# Patient Record
Sex: Female | Born: 1945 | Race: Black or African American | Hispanic: No | Marital: Single | Smoking: Current every day smoker
Health system: Southern US, Community
[De-identification: ages and names within clinical notes are randomized; demographics above are authoritative.]

## PROBLEM LIST (undated history)

## (undated) DIAGNOSIS — J189 Pneumonia, unspecified organism: Secondary | ICD-10-CM

## (undated) DIAGNOSIS — K219 Gastro-esophageal reflux disease without esophagitis: Secondary | ICD-10-CM

## (undated) DIAGNOSIS — K760 Fatty (change of) liver, not elsewhere classified: Secondary | ICD-10-CM

## (undated) DIAGNOSIS — I509 Heart failure, unspecified: Secondary | ICD-10-CM

## (undated) DIAGNOSIS — K861 Other chronic pancreatitis: Secondary | ICD-10-CM

## (undated) DIAGNOSIS — K831 Obstruction of bile duct: Secondary | ICD-10-CM

## (undated) DIAGNOSIS — F329 Major depressive disorder, single episode, unspecified: Secondary | ICD-10-CM

## (undated) DIAGNOSIS — L409 Psoriasis, unspecified: Secondary | ICD-10-CM

## (undated) DIAGNOSIS — F32A Depression, unspecified: Secondary | ICD-10-CM

## (undated) DIAGNOSIS — F1010 Alcohol abuse, uncomplicated: Secondary | ICD-10-CM

## (undated) DIAGNOSIS — I2510 Atherosclerotic heart disease of native coronary artery without angina pectoris: Secondary | ICD-10-CM

## (undated) DIAGNOSIS — I739 Peripheral vascular disease, unspecified: Secondary | ICD-10-CM

## (undated) DIAGNOSIS — I2109 ST elevation (STEMI) myocardial infarction involving other coronary artery of anterior wall: Secondary | ICD-10-CM

## (undated) DIAGNOSIS — E785 Hyperlipidemia, unspecified: Secondary | ICD-10-CM

## (undated) DIAGNOSIS — Z8489 Family history of other specified conditions: Secondary | ICD-10-CM

## (undated) DIAGNOSIS — I10 Essential (primary) hypertension: Secondary | ICD-10-CM

## (undated) DIAGNOSIS — Z9289 Personal history of other medical treatment: Secondary | ICD-10-CM

## (undated) DIAGNOSIS — K449 Diaphragmatic hernia without obstruction or gangrene: Secondary | ICD-10-CM

## (undated) DIAGNOSIS — E119 Type 2 diabetes mellitus without complications: Secondary | ICD-10-CM

## (undated) DIAGNOSIS — J42 Unspecified chronic bronchitis: Secondary | ICD-10-CM

## (undated) DIAGNOSIS — D126 Benign neoplasm of colon, unspecified: Secondary | ICD-10-CM

## (undated) HISTORY — DX: Psoriasis, unspecified: L40.9

## (undated) HISTORY — DX: Hyperlipidemia, unspecified: E78.5

## (undated) HISTORY — PX: UTERINE FIBROID SURGERY: SHX826

## (undated) HISTORY — PX: CARDIAC CATHETERIZATION: SHX172

## (undated) HISTORY — DX: Obstruction of bile duct: K83.1

## (undated) HISTORY — PX: COLONOSCOPY: SHX174

## (undated) HISTORY — DX: Major depressive disorder, single episode, unspecified: F32.9

## (undated) HISTORY — DX: Fatty (change of) liver, not elsewhere classified: K76.0

## (undated) HISTORY — DX: Diaphragmatic hernia without obstruction or gangrene: K44.9

## (undated) HISTORY — DX: Depression, unspecified: F32.A

## (undated) HISTORY — PX: BACK SURGERY: SHX140

## (undated) HISTORY — DX: Alcohol abuse, uncomplicated: F10.10

## (undated) HISTORY — DX: Benign neoplasm of colon, unspecified: D12.6

---

## 1974-10-24 HISTORY — PX: CHOLECYSTECTOMY OPEN: SUR202

## 1992-10-24 HISTORY — PX: ABDOMINAL HYSTERECTOMY: SHX81

## 1999-10-30 ENCOUNTER — Emergency Department (HOSPITAL_COMMUNITY): Admission: EM | Admit: 1999-10-30 | Discharge: 1999-10-30 | Payer: Self-pay | Admitting: Emergency Medicine

## 2000-02-02 ENCOUNTER — Ambulatory Visit (HOSPITAL_COMMUNITY): Admission: RE | Admit: 2000-02-02 | Discharge: 2000-02-02 | Payer: Self-pay | Admitting: Internal Medicine

## 2001-02-01 ENCOUNTER — Encounter: Payer: Self-pay | Admitting: Internal Medicine

## 2001-02-01 ENCOUNTER — Ambulatory Visit (HOSPITAL_COMMUNITY): Admission: RE | Admit: 2001-02-01 | Discharge: 2001-02-01 | Payer: Self-pay | Admitting: Internal Medicine

## 2001-11-08 ENCOUNTER — Encounter: Payer: Self-pay | Admitting: Emergency Medicine

## 2001-11-08 ENCOUNTER — Emergency Department (HOSPITAL_COMMUNITY): Admission: EM | Admit: 2001-11-08 | Discharge: 2001-11-08 | Payer: Self-pay | Admitting: Emergency Medicine

## 2002-05-22 ENCOUNTER — Encounter: Payer: Self-pay | Admitting: Internal Medicine

## 2002-05-22 ENCOUNTER — Ambulatory Visit (HOSPITAL_COMMUNITY): Admission: RE | Admit: 2002-05-22 | Discharge: 2002-05-22 | Payer: Self-pay | Admitting: Internal Medicine

## 2002-08-12 ENCOUNTER — Encounter: Payer: Self-pay | Admitting: Emergency Medicine

## 2002-08-13 ENCOUNTER — Inpatient Hospital Stay (HOSPITAL_COMMUNITY): Admission: EM | Admit: 2002-08-13 | Discharge: 2002-08-15 | Payer: Self-pay | Admitting: Emergency Medicine

## 2002-08-13 ENCOUNTER — Encounter: Payer: Self-pay | Admitting: Internal Medicine

## 2002-08-13 ENCOUNTER — Encounter (INDEPENDENT_AMBULATORY_CARE_PROVIDER_SITE_OTHER): Payer: Self-pay | Admitting: Cardiology

## 2002-09-16 ENCOUNTER — Inpatient Hospital Stay (HOSPITAL_COMMUNITY): Admission: RE | Admit: 2002-09-16 | Discharge: 2002-09-23 | Payer: Self-pay | Admitting: Cardiology

## 2002-09-20 ENCOUNTER — Encounter: Payer: Self-pay | Admitting: Cardiology

## 2003-06-02 ENCOUNTER — Encounter: Payer: Self-pay | Admitting: Internal Medicine

## 2003-06-02 ENCOUNTER — Ambulatory Visit (HOSPITAL_COMMUNITY): Admission: RE | Admit: 2003-06-02 | Discharge: 2003-06-02 | Payer: Self-pay | Admitting: Obstetrics

## 2004-03-31 ENCOUNTER — Emergency Department (HOSPITAL_COMMUNITY): Admission: EM | Admit: 2004-03-31 | Discharge: 2004-03-31 | Payer: Self-pay

## 2005-05-04 ENCOUNTER — Emergency Department (HOSPITAL_COMMUNITY): Admission: EM | Admit: 2005-05-04 | Discharge: 2005-05-04 | Payer: Self-pay | Admitting: Emergency Medicine

## 2005-09-28 ENCOUNTER — Emergency Department (HOSPITAL_COMMUNITY): Admission: EM | Admit: 2005-09-28 | Discharge: 2005-09-28 | Payer: Self-pay | Admitting: Emergency Medicine

## 2005-11-24 ENCOUNTER — Inpatient Hospital Stay (HOSPITAL_COMMUNITY): Admission: EM | Admit: 2005-11-24 | Discharge: 2005-11-26 | Payer: Self-pay | Admitting: Emergency Medicine

## 2005-11-24 ENCOUNTER — Ambulatory Visit: Payer: Self-pay | Admitting: Family Medicine

## 2005-12-23 ENCOUNTER — Ambulatory Visit: Payer: Self-pay | Admitting: Family Medicine

## 2006-01-05 ENCOUNTER — Encounter: Admission: RE | Admit: 2006-01-05 | Discharge: 2006-04-05 | Payer: Self-pay | Admitting: Family Medicine

## 2006-01-25 ENCOUNTER — Ambulatory Visit: Payer: Self-pay | Admitting: Family Medicine

## 2006-07-25 ENCOUNTER — Ambulatory Visit: Payer: Self-pay | Admitting: Sports Medicine

## 2006-08-18 ENCOUNTER — Ambulatory Visit: Payer: Self-pay | Admitting: Family Medicine

## 2006-10-25 ENCOUNTER — Ambulatory Visit: Payer: Self-pay | Admitting: Family Medicine

## 2006-10-25 ENCOUNTER — Emergency Department (HOSPITAL_COMMUNITY): Admission: EM | Admit: 2006-10-25 | Discharge: 2006-10-25 | Payer: Self-pay | Admitting: Emergency Medicine

## 2006-10-25 ENCOUNTER — Inpatient Hospital Stay (HOSPITAL_COMMUNITY): Admission: EM | Admit: 2006-10-25 | Discharge: 2006-10-28 | Payer: Self-pay | Admitting: Emergency Medicine

## 2006-11-07 ENCOUNTER — Ambulatory Visit (HOSPITAL_COMMUNITY): Admission: RE | Admit: 2006-11-07 | Discharge: 2006-11-07 | Payer: Self-pay | Admitting: Family Medicine

## 2006-11-07 ENCOUNTER — Encounter (INDEPENDENT_AMBULATORY_CARE_PROVIDER_SITE_OTHER): Payer: Self-pay | Admitting: Family Medicine

## 2006-11-07 ENCOUNTER — Ambulatory Visit: Payer: Self-pay | Admitting: Family Medicine

## 2006-11-07 LAB — CONVERTED CEMR LAB
Total CK: 90 units/L (ref 7–177)
Troponin I: 0.01 ng/mL (ref <0.06)

## 2006-11-16 ENCOUNTER — Ambulatory Visit: Payer: Self-pay | Admitting: Family Medicine

## 2006-11-30 ENCOUNTER — Ambulatory Visit: Payer: Self-pay | Admitting: Family Medicine

## 2006-12-05 ENCOUNTER — Ambulatory Visit: Payer: Self-pay | Admitting: Family Medicine

## 2006-12-05 ENCOUNTER — Encounter: Payer: Self-pay | Admitting: Family Medicine

## 2006-12-05 LAB — CONVERTED CEMR LAB
Cholesterol: 98 mg/dL (ref 0–200)
Total CHOL/HDL Ratio: 2.3
Triglycerides: 74 mg/dL (ref <150)
VLDL: 15 mg/dL (ref 0–40)

## 2006-12-21 DIAGNOSIS — E119 Type 2 diabetes mellitus without complications: Secondary | ICD-10-CM

## 2006-12-21 DIAGNOSIS — F17200 Nicotine dependence, unspecified, uncomplicated: Secondary | ICD-10-CM

## 2006-12-21 DIAGNOSIS — G4700 Insomnia, unspecified: Secondary | ICD-10-CM

## 2006-12-21 DIAGNOSIS — I10 Essential (primary) hypertension: Secondary | ICD-10-CM

## 2006-12-26 ENCOUNTER — Telehealth: Payer: Self-pay | Admitting: *Deleted

## 2006-12-27 ENCOUNTER — Telehealth: Payer: Self-pay | Admitting: Family Medicine

## 2007-01-17 ENCOUNTER — Emergency Department (HOSPITAL_COMMUNITY): Admission: EM | Admit: 2007-01-17 | Discharge: 2007-01-17 | Payer: Self-pay | Admitting: Emergency Medicine

## 2007-01-19 ENCOUNTER — Telehealth: Payer: Self-pay | Admitting: *Deleted

## 2007-01-22 ENCOUNTER — Telehealth: Payer: Self-pay | Admitting: *Deleted

## 2007-01-25 ENCOUNTER — Telehealth: Payer: Self-pay | Admitting: Family Medicine

## 2007-01-25 ENCOUNTER — Encounter: Payer: Self-pay | Admitting: Family Medicine

## 2007-01-25 ENCOUNTER — Ambulatory Visit: Payer: Self-pay | Admitting: Sports Medicine

## 2007-01-25 DIAGNOSIS — E785 Hyperlipidemia, unspecified: Secondary | ICD-10-CM | POA: Insufficient documentation

## 2007-01-25 DIAGNOSIS — I2510 Atherosclerotic heart disease of native coronary artery without angina pectoris: Secondary | ICD-10-CM

## 2007-01-25 LAB — CONVERTED CEMR LAB
Calcium: 8.5 mg/dL (ref 8.4–10.5)
Glucose, Bld: 113 mg/dL — ABNORMAL HIGH (ref 70–99)
HCT: 43.3 %
Platelets: 411 10*3/uL
Sodium: 138 meq/L (ref 135–145)
WBC: 7 10*3/uL

## 2007-01-26 ENCOUNTER — Telehealth: Payer: Self-pay | Admitting: *Deleted

## 2007-02-01 ENCOUNTER — Encounter (HOSPITAL_BASED_OUTPATIENT_CLINIC_OR_DEPARTMENT_OTHER): Admission: RE | Admit: 2007-02-01 | Discharge: 2007-05-02 | Payer: Self-pay | Admitting: Surgery

## 2007-02-09 ENCOUNTER — Observation Stay (HOSPITAL_COMMUNITY): Admission: EM | Admit: 2007-02-09 | Discharge: 2007-02-12 | Payer: Self-pay | Admitting: Emergency Medicine

## 2007-02-09 ENCOUNTER — Ambulatory Visit: Payer: Self-pay | Admitting: Family Medicine

## 2007-02-09 ENCOUNTER — Encounter: Payer: Self-pay | Admitting: Family Medicine

## 2007-02-15 ENCOUNTER — Ambulatory Visit: Admission: RE | Admit: 2007-02-15 | Discharge: 2007-02-15 | Payer: Self-pay | Admitting: Surgery

## 2007-02-15 ENCOUNTER — Encounter: Payer: Self-pay | Admitting: Vascular Surgery

## 2007-02-21 ENCOUNTER — Telehealth: Payer: Self-pay | Admitting: Family Medicine

## 2007-02-22 ENCOUNTER — Telehealth: Payer: Self-pay | Admitting: *Deleted

## 2007-02-28 ENCOUNTER — Encounter: Payer: Self-pay | Admitting: Family Medicine

## 2007-03-01 ENCOUNTER — Telehealth: Payer: Self-pay | Admitting: Family Medicine

## 2007-03-04 ENCOUNTER — Inpatient Hospital Stay (HOSPITAL_COMMUNITY): Admission: EM | Admit: 2007-03-04 | Discharge: 2007-03-07 | Payer: Self-pay | Admitting: Emergency Medicine

## 2007-03-22 ENCOUNTER — Telehealth: Payer: Self-pay | Admitting: Family Medicine

## 2007-04-06 ENCOUNTER — Telehealth: Payer: Self-pay | Admitting: *Deleted

## 2007-04-18 ENCOUNTER — Telehealth (INDEPENDENT_AMBULATORY_CARE_PROVIDER_SITE_OTHER): Payer: Self-pay | Admitting: *Deleted

## 2007-05-18 ENCOUNTER — Encounter (INDEPENDENT_AMBULATORY_CARE_PROVIDER_SITE_OTHER): Payer: Self-pay | Admitting: Family Medicine

## 2007-05-18 ENCOUNTER — Ambulatory Visit: Payer: Self-pay | Admitting: Family Medicine

## 2007-05-18 LAB — CONVERTED CEMR LAB: Hgb A1c MFr Bld: 5.3 %

## 2007-05-19 LAB — CONVERTED CEMR LAB
Basophils Relative: 0 % (ref 0–1)
Eosinophils Absolute: 0.2 10*3/uL (ref 0.0–0.7)
Eosinophils Relative: 4 % (ref 0–5)
Hemoglobin: 13.9 g/dL (ref 12.0–15.0)
Lymphs Abs: 1.5 10*3/uL (ref 0.7–3.3)
Monocytes Relative: 8 % (ref 3–11)
Neutrophils Relative %: 57 % (ref 43–77)
RBC: 5.71 M/uL — ABNORMAL HIGH (ref 3.87–5.11)
RDW: 21.1 % — ABNORMAL HIGH (ref 11.5–14.0)
TSH: 2.463 microintl units/mL (ref 0.350–5.50)

## 2007-05-30 ENCOUNTER — Telehealth: Payer: Self-pay | Admitting: *Deleted

## 2007-05-31 ENCOUNTER — Telehealth (INDEPENDENT_AMBULATORY_CARE_PROVIDER_SITE_OTHER): Payer: Self-pay | Admitting: Family Medicine

## 2007-06-28 ENCOUNTER — Encounter (INDEPENDENT_AMBULATORY_CARE_PROVIDER_SITE_OTHER): Payer: Self-pay | Admitting: *Deleted

## 2007-08-22 ENCOUNTER — Telehealth (INDEPENDENT_AMBULATORY_CARE_PROVIDER_SITE_OTHER): Payer: Self-pay | Admitting: Family Medicine

## 2007-11-22 ENCOUNTER — Inpatient Hospital Stay (HOSPITAL_COMMUNITY): Admission: AD | Admit: 2007-11-22 | Discharge: 2007-11-26 | Payer: Self-pay | Admitting: Family Medicine

## 2007-11-22 ENCOUNTER — Ambulatory Visit: Payer: Self-pay | Admitting: Family Medicine

## 2007-11-22 ENCOUNTER — Encounter: Payer: Self-pay | Admitting: Emergency Medicine

## 2007-11-26 ENCOUNTER — Telehealth: Payer: Self-pay | Admitting: *Deleted

## 2008-01-16 ENCOUNTER — Encounter (INDEPENDENT_AMBULATORY_CARE_PROVIDER_SITE_OTHER): Payer: Self-pay | Admitting: Family Medicine

## 2008-01-16 ENCOUNTER — Encounter: Payer: Self-pay | Admitting: Sports Medicine

## 2008-01-16 ENCOUNTER — Inpatient Hospital Stay (HOSPITAL_COMMUNITY): Admission: EM | Admit: 2008-01-16 | Discharge: 2008-01-18 | Payer: Self-pay | Admitting: Emergency Medicine

## 2008-01-16 LAB — CONVERTED CEMR LAB
Cholesterol: 89 mg/dL
Cholesterol: 89 mg/dL
HDL: 39 mg/dL
HDL: 39 mg/dL
LDL Cholesterol: 38 mg/dL
Triglycerides: 59 mg/dL
Triglycerides: 59 mg/dL
VLDL: 12 mg/dL

## 2008-02-13 ENCOUNTER — Telehealth (INDEPENDENT_AMBULATORY_CARE_PROVIDER_SITE_OTHER): Payer: Self-pay | Admitting: Family Medicine

## 2008-03-21 ENCOUNTER — Ambulatory Visit: Payer: Self-pay | Admitting: Family Medicine

## 2008-03-21 ENCOUNTER — Encounter (INDEPENDENT_AMBULATORY_CARE_PROVIDER_SITE_OTHER): Payer: Self-pay | Admitting: Family Medicine

## 2008-03-21 DIAGNOSIS — F1021 Alcohol dependence, in remission: Secondary | ICD-10-CM

## 2008-03-21 LAB — CONVERTED CEMR LAB
BUN: 10 mg/dL (ref 6–23)
CO2: 29 meq/L (ref 19–32)
Calcium: 9 mg/dL (ref 8.4–10.5)
Chloride: 104 meq/L (ref 96–112)
Creatinine, Ser: 0.59 mg/dL (ref 0.40–1.20)

## 2008-03-23 ENCOUNTER — Encounter (INDEPENDENT_AMBULATORY_CARE_PROVIDER_SITE_OTHER): Payer: Self-pay | Admitting: Family Medicine

## 2008-03-25 ENCOUNTER — Telehealth (INDEPENDENT_AMBULATORY_CARE_PROVIDER_SITE_OTHER): Payer: Self-pay | Admitting: Family Medicine

## 2008-03-27 ENCOUNTER — Telehealth (INDEPENDENT_AMBULATORY_CARE_PROVIDER_SITE_OTHER): Payer: Self-pay | Admitting: *Deleted

## 2008-07-28 ENCOUNTER — Encounter: Payer: Self-pay | Admitting: *Deleted

## 2008-07-30 ENCOUNTER — Telehealth (INDEPENDENT_AMBULATORY_CARE_PROVIDER_SITE_OTHER): Payer: Self-pay | Admitting: *Deleted

## 2008-08-13 ENCOUNTER — Encounter (INDEPENDENT_AMBULATORY_CARE_PROVIDER_SITE_OTHER): Payer: Self-pay | Admitting: Family Medicine

## 2008-08-13 ENCOUNTER — Ambulatory Visit: Payer: Self-pay | Admitting: Family Medicine

## 2008-08-13 DIAGNOSIS — K861 Other chronic pancreatitis: Secondary | ICD-10-CM

## 2008-08-13 DIAGNOSIS — L408 Other psoriasis: Secondary | ICD-10-CM

## 2008-08-15 ENCOUNTER — Telehealth: Payer: Self-pay | Admitting: *Deleted

## 2008-08-18 LAB — CONVERTED CEMR LAB
ALT: 142 units/L — ABNORMAL HIGH (ref 0–35)
AST: 119 units/L — ABNORMAL HIGH (ref 0–37)
Alkaline Phosphatase: 306 units/L — ABNORMAL HIGH (ref 39–117)
Basophils Absolute: 0 10*3/uL (ref 0.0–0.1)
Basophils Relative: 0 % (ref 0–1)
Eosinophils Absolute: 0.2 10*3/uL (ref 0.0–0.7)
Eosinophils Relative: 3 % (ref 0–5)
HCT: 46 % (ref 36.0–46.0)
MCV: 70.7 fL — ABNORMAL LOW (ref 78.0–100.0)
Neutrophils Relative %: 58 % (ref 43–77)
Platelets: 573 10*3/uL — ABNORMAL HIGH (ref 150–400)
RDW: 19.2 % — ABNORMAL HIGH (ref 11.5–15.5)
Sodium: 139 meq/L (ref 135–145)
Total Bilirubin: 0.9 mg/dL (ref 0.3–1.2)
Total Protein: 7.7 g/dL (ref 6.0–8.3)

## 2008-09-10 ENCOUNTER — Ambulatory Visit: Payer: Self-pay | Admitting: Family Medicine

## 2008-09-25 ENCOUNTER — Encounter (INDEPENDENT_AMBULATORY_CARE_PROVIDER_SITE_OTHER): Payer: Self-pay | Admitting: Family Medicine

## 2008-09-30 ENCOUNTER — Ambulatory Visit: Payer: Self-pay | Admitting: Family Medicine

## 2008-10-08 ENCOUNTER — Ambulatory Visit (HOSPITAL_COMMUNITY): Admission: RE | Admit: 2008-10-08 | Discharge: 2008-10-08 | Payer: Self-pay | Admitting: Family Medicine

## 2008-10-10 ENCOUNTER — Encounter (INDEPENDENT_AMBULATORY_CARE_PROVIDER_SITE_OTHER): Payer: Self-pay | Admitting: Family Medicine

## 2009-02-19 ENCOUNTER — Encounter (INDEPENDENT_AMBULATORY_CARE_PROVIDER_SITE_OTHER): Payer: Self-pay | Admitting: Family Medicine

## 2009-04-14 ENCOUNTER — Telehealth (INDEPENDENT_AMBULATORY_CARE_PROVIDER_SITE_OTHER): Payer: Self-pay | Admitting: Family Medicine

## 2010-01-05 ENCOUNTER — Encounter: Payer: Self-pay | Admitting: Sports Medicine

## 2010-01-21 ENCOUNTER — Ambulatory Visit: Payer: Self-pay | Admitting: Family Medicine

## 2010-01-21 ENCOUNTER — Encounter: Payer: Self-pay | Admitting: Family Medicine

## 2010-01-21 ENCOUNTER — Inpatient Hospital Stay (HOSPITAL_COMMUNITY): Admission: EM | Admit: 2010-01-21 | Discharge: 2010-01-24 | Payer: Self-pay | Admitting: Emergency Medicine

## 2010-01-22 ENCOUNTER — Encounter: Payer: Self-pay | Admitting: Internal Medicine

## 2010-01-22 LAB — CONVERTED CEMR LAB
HDL: 13 mg/dL
LDL Cholesterol: 24 mg/dL

## 2010-01-23 LAB — CONVERTED CEMR LAB
Creatinine, Ser: 0.51 mg/dL
Potassium: 3.8 meq/L

## 2010-02-03 ENCOUNTER — Encounter: Payer: Self-pay | Admitting: Sports Medicine

## 2010-02-03 ENCOUNTER — Ambulatory Visit: Payer: Self-pay | Admitting: Family Medicine

## 2010-02-03 ENCOUNTER — Telehealth (INDEPENDENT_AMBULATORY_CARE_PROVIDER_SITE_OTHER): Payer: Self-pay | Admitting: Family Medicine

## 2010-02-03 LAB — CONVERTED CEMR LAB
Albumin/Creatinine Ratio, Urine, POC: ABNORMAL
BUN: 18 mg/dL (ref 6–23)
CO2: 17 meq/L — ABNORMAL LOW (ref 19–32)
Calcium: 8.4 mg/dL (ref 8.4–10.5)
Chloride: 111 meq/L (ref 96–112)
Cholesterol: 77 mg/dL (ref 0–200)
Creatinine, Ser: 0.47 mg/dL (ref 0.40–1.20)
Creatinine,U: 100 mg/dL
Glucose, Bld: 67 mg/dL — ABNORMAL LOW (ref 70–99)
HDL: 22 mg/dL — ABNORMAL LOW (ref >39)
Hgb A1c MFr Bld: 5.7 %
LDL Cholesterol: 43 mg/dL (ref 0–99)
Microalbumin U total vol: ABNORMAL mg/L
Potassium: 4 meq/L (ref 3.5–5.3)
Sodium: 140 meq/L (ref 135–145)
Total CHOL/HDL Ratio: 3.5
Triglycerides: 61 mg/dL (ref <150)
VLDL: 12 mg/dL (ref 0–40)

## 2010-02-04 ENCOUNTER — Encounter: Payer: Self-pay | Admitting: Sports Medicine

## 2010-02-11 ENCOUNTER — Telehealth: Payer: Self-pay | Admitting: Sports Medicine

## 2010-02-16 ENCOUNTER — Telehealth: Payer: Self-pay | Admitting: Sports Medicine

## 2010-02-22 ENCOUNTER — Telehealth: Payer: Self-pay | Admitting: Sports Medicine

## 2010-02-22 ENCOUNTER — Telehealth (INDEPENDENT_AMBULATORY_CARE_PROVIDER_SITE_OTHER): Payer: Self-pay | Admitting: *Deleted

## 2010-02-24 ENCOUNTER — Ambulatory Visit: Payer: Self-pay | Admitting: Family Medicine

## 2010-02-25 ENCOUNTER — Telehealth: Payer: Self-pay | Admitting: Sports Medicine

## 2010-03-19 ENCOUNTER — Encounter: Payer: Self-pay | Admitting: Sports Medicine

## 2010-03-19 ENCOUNTER — Ambulatory Visit: Payer: Self-pay | Admitting: Family Medicine

## 2010-04-02 ENCOUNTER — Encounter: Payer: Self-pay | Admitting: Sports Medicine

## 2010-04-20 HISTORY — PX: OTHER SURGICAL HISTORY: SHX169

## 2010-04-20 HISTORY — PX: TRANSTHORACIC ECHOCARDIOGRAM: SHX275

## 2010-04-21 ENCOUNTER — Telehealth: Payer: Self-pay | Admitting: *Deleted

## 2010-05-04 ENCOUNTER — Ambulatory Visit: Payer: Self-pay | Admitting: Internal Medicine

## 2010-05-04 ENCOUNTER — Encounter (INDEPENDENT_AMBULATORY_CARE_PROVIDER_SITE_OTHER): Payer: Self-pay | Admitting: *Deleted

## 2010-06-16 ENCOUNTER — Ambulatory Visit: Payer: Self-pay | Admitting: Internal Medicine

## 2010-06-17 ENCOUNTER — Encounter (INDEPENDENT_AMBULATORY_CARE_PROVIDER_SITE_OTHER): Payer: Self-pay | Admitting: *Deleted

## 2010-08-30 ENCOUNTER — Encounter: Payer: Self-pay | Admitting: Sports Medicine

## 2010-11-13 ENCOUNTER — Encounter: Payer: Self-pay | Admitting: Internal Medicine

## 2010-11-14 ENCOUNTER — Encounter: Payer: Self-pay | Admitting: Sports Medicine

## 2010-11-14 ENCOUNTER — Encounter: Payer: Self-pay | Admitting: Cardiovascular Disease

## 2010-11-23 NOTE — Progress Notes (Signed)
Summary: phn msg  Phone Note Call from Patient Call back at Home Phone [phone]   Caller: Patient Summary of Call: pls mail paperwork to her home when finished. Initial call taken by: De Nurse,  Feb 25, 2010 8:44 AM  Follow-up for Phone Call        Is this the paperwork for the nutritional supplements?  If so I have already filled out and placed in to-do box. Follow-up by: Rodney Langton MD,  Feb 25, 2010 1:42 PM  Additional Follow-up for Phone Call Additional follow up Details #1::        mailed Additional Follow-up by: De Nurse,  Feb 25, 2010 4:03 PM

## 2010-11-23 NOTE — Miscellaneous (Signed)
Summary: clerarance letter needed  Clinical Lists Changes the oral surgeon needs a letter to clear her for the dental extractions he will be doing. to pcp.Golden Circle RN  April 02, 2010 8:39 AM  Noted, she has had a recent MI and has a drug eluting stent in place so she will need a cardiac stress test of some kind, I have referred her to cardiology for this at the end of last month.  Awaiting their recs.  Now if her Oral surgeon can do this under local anesthesia then she wouldn't need clearance.  Let me know. Rodney Langton MD  April 02, 2010 10:28 AM   Appended Document: clerarance letter needed D/w Dr. Karsten Fells at [facility], pt will need  a myoview, scheduled for next week.  Clearance letter will then be mailed to dentist by [facility]. -Dr. Karie Schwalbe.

## 2010-11-23 NOTE — Progress Notes (Signed)
Summary: triage  Phone Note Call from Patient Call back at Home Phone [phone]   Caller: Patient Summary of Call: Has diahrrea.  Initial call taken by: Clydell Hakim,  Feb 22, 2010 10:22 AM  Follow-up for Phone Call        diarrhea x 6 wk. states she told md about it when she was in hospital. tried immodium with no relief. has 3-4 loose stools a day. takes metformin. does not check her cbgs. no other symptoms. she has been & will continue to drink plenty of water. to call back at any time if worse or she can get a ride sooner. told her we always have a doctor on call at night. she agreed with plan. appt at 8:30 wed. wanted early am appt Follow-up by: Golden Circle RN,  Feb 22, 2010 10:42 AM  Additional Follow-up for Phone Call Additional follow up Details #1::        Noted, SDA wed. Additional Follow-up by: Rodney Langton MD,  Feb 22, 2010 1:46 PM

## 2010-11-23 NOTE — Procedures (Signed)
Summary: Colonoscopy  Patient: [name] Foley Note: All result statuses are Final unless otherwise noted.  Tests: (1) Colonoscopy (COL)   COL Colonoscopy           DONE     [facility]     [address]     [place], [place]  [address]           COLONOSCOPY PROCEDURE REPORT           PATIENT:  [name], [name]  MR#:  [id number]     BIRTHDATE:  1946/04/24, 63 yrs. old  GENDER:  female     ENDOSCOPIST:  Iva Boop, MD, [facility]           PROCEDURE DATE:  06/16/2010     PROCEDURE:  Incomplete colonoscopy     ASA CLASS:  Class III     INDICATIONS:  Routine Risk Screening     MEDICATIONS:   Fentanyl 75 mcg IV, Versed 8 mg IV           DESCRIPTION OF PROCEDURE:   After the risks benefits and     alternatives of the procedure were thoroughly explained, informed     consent was obtained.  Digital rectal exam was performed and     revealed no abnormalities.   The LB PCF-[id number] endoscope     was introduced through the anus and advanced to the mid transverse     colon, limited by poor preparation, extreme patient discomfort,     looping difficulties.    The quality of the prep was poor, using     MoviPrep.  The instrument was then slowly withdrawn as the colon     was fully examined.     <<PROCEDUREIMAGES>>           FINDINGS:  The prep was not adequate to allow appropriate     inspection of the mucosa.     There was significant retained stool that was adherent and oily     (steatorrhea effect). It was not very amenable to flushing. Due to     this and some difficulty advancing the scope, the procedure was     terminated.   Retroflexed views in the rectum revealed not done.     The scope was then withdrawn from the patient and the procedure     completed.           COMPLICATIONS:  None     ENDOSCOPIC IMPRESSION:     1) Poor prep - procedure terminated due to this and difficulty     advancing scope. Exam is not adequate or complete.     RECOMMENDATIONS:     We will  contact her and arrange an office visit to discuss     repeating the colonoscopy. anticipate needing to control     steatorrhea better and probably needs Propofol sedation.           REPEAT EXAM:  Office visit within 2 months.           Iva Boop, MD, [facility]           CC:  Fleet Contras, MD     The Patient           n.     Rosalie Doctor:   Iva Boop at 06/16/2010 10:58 AM           [name], [name], [id number]  Note: An exclamation mark (!) indicates a result that was not dispersed  into the flowsheet. Document Creation Date: 06/16/2010 12:20 PM _______________________________________________________________________  (1) Order result status: Final Collection or observation date-time: 06/16/2010 10:51 Requested date-time:  Receipt date-time:  Reported date-time:  Referring Physician:   Ordering Physician: Stan Head [phone]) Specimen Source:  Source: Launa Grill Order Number: [id number] Lab site:

## 2010-11-23 NOTE — Letter (Signed)
Summary: Diabetic Instructions   Gastroenterology  [address] [place], [place] [address]   Phone: [phone]  Fax: [phone]    [name] Foley 1946/08/07 MRN: [id number]   _x_   ORAL DIABETIC MEDICATION INSTRUCTIONS  The day before your procedure:   Take your diabetic pill as you do normally  The day of your procedure:   Do not take your diabetic pill    We will check your blood sugar levels during the admission process and again in Recovery before discharging you home

## 2010-11-23 NOTE — Assessment & Plan Note (Signed)
Summary: diarrhea/Amoret/T   Vital Signs:  Patient profile:   66 year old female Height:      62 inches Weight:      129 pounds BMI:     23.68 BSA:     1.59 Temp:     97.6 degrees F Pulse rate:   73 / minute BP sitting:   122 / 85  Vitals Entered By: Jone Baseman CMA (Feb 24, 2010 8:35 AM) CC: diarrhea Is Patient Diabetic? No Pain Assessment Patient in pain? no        Primary Care Provider:  Rodney Langton MD  CC:  diarrhea.  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  Diarrhea is watery, no blood, not sure if there is a lot of fat to it.  She has one episode as soon as she wakes up and has her coffee, then after eating has more.  She gradually gets better throughout the day and has less diarrhea in the evening.  She does take her pancreatic enzymes three times a day with each meal and no longer drinks alcohol.  Does not follow a low fat diet, and a review of her diet indicates a preference for chicken, hot dogs, hamburgers.  Habits & Providers  Alcohol-Tobacco-Diet     Tobacco Status: current     Tobacco Counseling: to quit use of tobacco products     Cigarette Packs/Day: 0.25  Allergies (verified): 1)  Voltaren  Social History: Reviewed history from 01/21/2010 and no changes required. lives in [place], single. No kids.  Smokes 1/2 ppd. Significant h/o alcohol abuse w/ multiple admissions for pancreatitis.  Quit (per pt) 4/09Packs/Day:  0.25  Review of Systems       Per HPI.  Physical Exam  General:  Frail appearing, no acute distress Mouth:  Moist mucous membranes, poor dentition. Abdomen:  +BS, swollen, nontender, no masses or hepatosplenomegaly. Psych:  Flat affect, poor eye contact.   Impression & Recommendations:  Problem # 1:  PANCREATITIS, CHRONIC (ICD-577.1) Assessment Deteriorated Advised low fat  diet, though patient doesn't seem to have solid understanding of this.  We discussed options, and I recommended a referral to dietitian, which she declines at this time.  She presents with paperwork for financial assistance for her Ensure supplements, which I have placed in her PCP's box to complete as he sees appropriate. Orders: [facility]- Est Level  3 (04540)  Problem # 2:  ALCOHOL ABUSE, HX OF (ICD-V11.3) Assessment: Improved In remission.  Complete Medication List: 1)  Famotidine 20 Mg Tabs (Famotidine) .... Take 1 tablet by mouth twice a day 2)  Metformin Hcl 500 Mg Tabs (Metformin hcl) .Marland Kitchen.. 1 tab by mouth daily 3)  Plavix 75 Mg Tabs (Clopidogrel bisulfate) .... Take 1 tablet by mouth once a day 4)  Mirtazapine 15 Mg Tabs (Mirtazapine) .... 1/2  tab by mouth at bedtime 5)  Clonazepam 1 Mg Tabs (Clonazepam) .... One tab by mouth qhs 6)  Megestrol Acetate 400 Mg/23ml Susp (Megestrol acetate) .Marland Kitchen.. 10ml by mouth daily 7)  Lisinopril 2.5 Mg Tabs (Lisinopril) .... One tab by mouth daily 8)  Pancreaze 98119 Unit Cpep (Pancrelipase (lip-prot-amyl)) .... One tab by mouth tid  Patient Instructions: 1)  You need to eat a low fat diet to reduce the diarrhea.  Monitor the foods you eat to see which ones make the diarrhea worse.  As I mentioned,  we have a dietitian who can help you make food choices if you desire to see her. 2)  Keep taking the pancreatic enzymes three times a day with each meal. 3)  I've given your paperwork for Ensure to Dr. Benjamin Stain. 4)  Please schedule a follow-up appointment in 2 weeks with Dr. Benjamin Stain.

## 2010-11-23 NOTE — Progress Notes (Signed)
Summary: triage  Phone Note Call from Patient Call back at Home Phone [phone]   Caller: Patient Summary of Call: Pt has diahrrea and wonders if something for this can be called in? Initial call taken by: Clydell Hakim,  February 11, 2010 8:39 AM  Follow-up for Phone Call        diarrhea x 1 month. worse lately. on new meds since last hospitalization. bms 4-5 times a day-watery. urged her to come in. she cannot get a ride today. told her to have someone get her immodium. explained dosing. to drink lots of fluids to replace what she is losing. told her not improved after 4 doses of immodium, find a ride to UC. told her we are closed tommorow & the weekend. she agreed with plan Follow-up by: Golden Circle RN,  February 11, 2010 8:45 AM  Additional Follow-up for Phone Call Additional follow up Details #1::        Noted, thanks!  Also, is she taking the pancreatic enzyme replacement pills?  Her diarrhea is likely from her chronic pancreatitis, and lacking enzymes to absorb fats and proteins, and resultant malabsorption and diarrhea. Additional Follow-up by: Rodney Langton MD,  February 11, 2010 11:17 PM    Additional Follow-up for Phone Call Additional follow up Details #2::    still having 4-5 watery stools a day. has not had anyone willing to take her to the store or go for her.  declined appt here. will get & try the immodium. explained about the pancreatic enzymes & told her how important they are. urged her to keep taking. she will call for appt if the immodim does not slow things down Follow-up by: Golden Circle RN,  February 15, 2010 9:19 AM  Additional Follow-up for Phone Call Additional follow up Details #3:: Details for Additional Follow-up Action Taken: Noted, thanks. Additional Follow-up by: Rodney Langton MD,  February 15, 2010 11:31 AM

## 2010-11-23 NOTE — Assessment & Plan Note (Signed)
Summary: preop clearance,tcb   Vital Signs:  Patient profile:   65 year old female Weight:      132.8 pounds BMI:     24.38 Temp:     98.6 degrees F Pulse rate:   78 / minute BP sitting:   121 / 84  (left arm)  Vitals Entered By: Starleen Blue RN (Mar 19, 2010 1:44 PM) CC: pre-op clearance Is Patient Diabetic? Yes Pain Assessment Patient in pain? no        Primary Care Provider:  Rodney Langton MD  CC:  pre-op clearance.  History of Present Illness: Preop:  Having tooth extraction under general anesthesia, needs preop clearance.  Has had MI in 2006 and has DES in her LAD, on plavix.  No symptoms of CP, SOB, presyncope, palpitations and can climb 5 flights of steps without any difficulty.  Diarrhea: still present, daily, no better with immodium.  Hasn't had stool studies done.  Chronic pancreatitis, taking pancreaze enzymes at very low dose.    Habits & Providers  Alcohol-Tobacco-Diet     Tobacco Status: current     Cigarette Packs/Day: 0.5  Current Medications (verified): 1)  Famotidine 20 Mg Tabs (Famotidine) .... Take 1 Tablet By Mouth Twice A Day 2)  Metformin Hcl 500 Mg Tabs (Metformin Hcl) .Marland Kitchen.. 1 Tab By Mouth Daily 3)  Plavix 75 Mg Tabs (Clopidogrel Bisulfate) .... Take 1 Tablet By Mouth Once A Day 4)  Mirtazapine 15 Mg  Tabs (Mirtazapine) .... 1/2  Tab By Mouth At Bedtime 5)  Clonazepam 1 Mg Tabs (Clonazepam) .... One Tab By Mouth Qhs 6)  Megestrol Acetate 400 Mg/75ml Susp (Megestrol Acetate) .Marland Kitchen.. 10ml By Mouth Daily 7)  Pancreaze 40981 Unit Cpep (Pancrelipase (Lip-Prot-Amyl)) .... One Tab By Mouth Three Times A Day With Meals 8)  Diphenoxylate-Atropine 2.5-0.025 Mg Tabs (Diphenoxylate-Atropine) .... One To Two Tabs By Mouth Two Times A Day As Needed For Diarrhea.  Allergies (verified): 1)  Voltaren  Past History:  Past Medical History: Last updated: 01/21/2010 85% stenosis of the LAD-PCTA,  h/o seizures when klonopin/etoh withdrawal, PSORIASIS  (ICD-696.1) ALCOHOL ABUSE, HX OF (ICD-V11.3) PANCREATITIS, CHRONIC (ICD-577.1) BLOOD IN STOOL, OCCULT (ICD-792.1) TOBACCO DEPENDENCE (ICD-305.1) Hx of MYOCARDIAL INFARCTION, INITIAL EPISODE OF CARE (ICD-410.90) INSOMNIA NOS (ICD-780.52) HYPERTENSION, BENIGN SYSTEMIC (ICD-401.1) DIABETES MELLITUS II, UNCOMPLICATED (ICD-250.00) HYPERLIPIDEMIA (ICD-272.4) CORONARY ARTERY DISEASE (ICD-414.00)  Social History: Packs/Day:  0.5  Review of Systems       See HPI  Physical Exam  General:  Well-developed,well-nourished,in no acute distress; alert,appropriate and cooperative throughout examination Lungs:  Normal respiratory effort, chest expands symmetrically. Lungs are clear to auscultation, no crackles or wheezes. Heart:  Normal rate and regular rhythm. S1 and S2 normal without gallop, murmur, click, rub or other extra sounds. Abdomen:  Bowel sounds positive,abdomen soft and non-tender without masses, organomegaly or hernias noted. Extremities:  No clubbing, cyanosis, edema, or deformity noted with normal full range of motion of all joints.     Impression & Recommendations:  Problem # 1:  PREOPERATIVE EXAMINATION (ICD-V72.84) Assessment New Referral to cards for preop clearance. [facility].  Orders: Cardiology Referral (Cardiology) [facility]- Est  Level 4 ([address])  Problem # 2:  DIARRHEA, CHRONIC (ICD-787.91) Assessment: Unchanged Checking some stool studies including stool osmolar gap.  Will try diphenoxylate, will also increase pancreatic enzymes to double the dose 21000 units PO with meals.  Can go up to 150,000 units of lipase per meal if needed!  Her updated medication list for this problem includes:  Diphenoxylate-atropine 2.5-0.025 Mg Tabs (Diphenoxylate-atropine) ..... One to two tabs by mouth two times a day as needed for diarrhea.  Orders: [facility]- Est  Level 4 ([address])Future Orders: Culture, Stool- [facility] [id number]) ... 03/20/2011 Stool C-Diff toxin assay- [facility] [id number]) ...  03/20/2011 Stool Giardia/Cryptosporidium-[facility] [phone]) ... 03/20/2011 Stool, WBC/Lactoferrin-[facility] ([address]) ... 03/20/2011 Miscellaneous Lab Charge-[facility] [id number]) ... 03/20/2011  Problem # 3:  PANCREATITIS, CHRONIC (ICD-577.1) Assessment: Unchanged See #2.  Orders: [facility]- Est  Level 4 ([address])Future Orders: Culture, Stool- [facility] [phone]) ... 03/20/2011 Stool C-Diff toxin assay- [facility] [id number]) ... 03/20/2011 Stool Giardia/Cryptosporidium-[facility] [phone]) ... 03/20/2011 Stool, WBC/Lactoferrin-[facility] ([id number]) ... 03/20/2011 Miscellaneous Lab Charge-[facility] [id number]) ... 03/20/2011  Complete Medication List: 1)  Famotidine 20 Mg Tabs (Famotidine) .... Take 1 tablet by mouth twice a day 2)  Metformin Hcl 500 Mg Tabs (Metformin hcl) .Marland Kitchen.. 1 tab by mouth daily 3)  Plavix 75 Mg Tabs (Clopidogrel bisulfate) .... Take 1 tablet by mouth once a day 4)  Mirtazapine 15 Mg Tabs (Mirtazapine) .... 1/2  tab by mouth at bedtime 5)  Clonazepam 1 Mg Tabs (Clonazepam) .... One tab by mouth qhs 6)  Megestrol Acetate 400 Mg/42ml Susp (Megestrol acetate) .Marland Kitchen.. 10ml by mouth daily 7)  Pancreaze 46270 Unit Cpep (Pancrelipase (lip-prot-amyl)) .... One tab by mouth three times a day with meals 8)  Diphenoxylate-atropine 2.5-0.025 Mg Tabs (Diphenoxylate-atropine) .... One to two tabs by mouth two times a day as needed for diarrhea.  Patient Instructions: 1)  Diarrhea: 2)  Increase pancreatic enzymes to the new dose, two tabs with each meal until you run out then fill the new higher dose. 3)  Come back for stool studies (make lab appt) 4)  Surgery: 5)  Cardiology referral for pre-op clearance. 6)  Come back to see me in 2 weeks to see how the diarrhea is. 7)  -Dr. Karie Schwalbe. Prescriptions: DIPHENOXYLATE-ATROPINE 2.5-0.025 MG TABS (DIPHENOXYLATE-ATROPINE) One to two tabs by mouth two times a day as needed for diarrhea.  #60 x 6   Entered and Authorized by:   Rodney Langton MD   Signed by:   Rodney Langton MD on  03/19/2010   Method used:   Reprint   RxID:   [id number] PANCREAZE 96789 UNIT CPEP (PANCRELIPASE (LIP-PROT-AMYL)) One tab by mouth three times a day with meals  #90 x 6   Entered and Authorized by:   Rodney Langton MD   Signed by:   Rodney Langton MD on 03/19/2010   Method used:   Reprint   RxID:   [id number] DIPHENOXYLATE-ATROPINE 2.5-0.025 MG TABS (DIPHENOXYLATE-ATROPINE) One to two tabs by mouth two times a day as needed for diarrhea.  #60 x 6   Entered and Authorized by:   Rodney Langton MD   Signed by:   Rodney Langton MD on 03/19/2010   Method used:   Printed then faxed to ...       [facility] (retail)       [address]       [place], [place]  [address]       Ph: [phone]       Fax: [phone]   RxID:   [id number] PANCREAZE 21000 UNIT CPEP (PANCRELIPASE (LIP-PROT-AMYL)) One tab by mouth three times a day with meals  #90 x 6   Entered and Authorized by:   Rodney Langton MD   Signed by:   Rodney Langton MD on 03/19/2010   Method used:   Printed then faxed to .Marland KitchenMarland Kitchen  [facility] (retail)       [address]       [place], [place]  [address]       Ph: [phone]       Fax: [phone]   RxID:   [id number]

## 2010-11-23 NOTE — Letter (Signed)
Summary: Lipid Letter  [facility]  [address]   [place], [place] [address]   Phone: [phone]  Fax: [phone]    02/04/2010  [name] Foley [address] [place], [place]  [address]  Dear Ms. [name]:  We have carefully reviewed your last lipid profile from 02/03/2010 and the results are noted below with a summary of recommendations for lipid management.    Cholesterol:       77     Goal: <200   HDL "good" Cholesterol:   22     Goal: >40   LDL "bad" Cholesterol:   43     Goal: <100   Triglycerides:       61     Goal: <150    Your numbers are good even without your cholesterol medicine, this probably has to do with your diet.  Your HDL is low indicating that you should increase your physical activity to at least a daily walk outside for 30 minutes.  I wouldn't want you to miss out on the nice spring weather after a long winter!    TLC Diet (Therapeutic Lifestyle Change): Saturated Fats & Transfatty acids should be kept < 7% of total calories ***Reduce Saturated Fats Polyunstaurated Fat can be up to 10% of total calories Monounsaturated Fat Fat can be up to 20% of total calories Total Fat should be no greater than 25-35% of total calories Carbohydrates should be 50-60% of total calories Protein should be approximately 15% of total calories Fiber should be at least 20-30 grams a day ***Increased fiber may help lower LDL Total Cholesterol should be < 200mg /day Consider adding plant stanol/sterols to diet (example: Benacol spread) ***A higher intake of unsaturated fat may reduce Triglycerides and Increase HDL    Adjunctive Measures (may lower LIPIDS and reduce risk of Heart Attack) include: Aerobic Exercise (20-30 minutes 3-4 times a week) Limit Alcohol Consumption Weight Reduction Aspirin 75-81 mg a day by mouth (if not allergic or contraindicated) Dietary Fiber 20-30 grams a day by mouth     Current Medications: 1)    Famotidine 20 Mg Tabs  (Famotidine) .... Take 1 tablet by mouth twice a day 2)    Metformin Hcl 500 Mg Tabs (Metformin hcl) .Marland Kitchen.. 1 tab by mouth daily 3)    Plavix 75 Mg Tabs (Clopidogrel bisulfate) .... Take 1 tablet by mouth once a day 4)    Mirtazapine 15 Mg  Tabs (Mirtazapine) .... 1/2  tab by mouth at bedtime 5)    Clonazepam 1 Mg Tabs (Clonazepam) .... One tab by mouth qhs 6)    Megestrol Acetate 400 Mg/62ml Susp (Megestrol acetate) .Marland Kitchen.. 10ml by mouth daily 7)    Lisinopril 2.5 Mg Tabs (Lisinopril) .... One tab by mouth daily 8)    Pancreaze 68341 Unit Cpep (Pancrelipase (lip-prot-amyl)) .... One tab by mouth tid  If you have any questions, please call. We appreciate being able to work with you.   Sincerely,    [facility] Rodney Langton MD  Appended Document: Lipid Letter mailed

## 2010-11-23 NOTE — Miscellaneous (Signed)
  Clinical Lists Changes  Problems: Removed problem of PREOPERATIVE EXAMINATION (ICD-V72.84) Removed problem of SPECIAL SCREENING FOR MALIGNANT NEOPLASMS COLON (ICD-V76.51) Removed problem of UNSPECIFIED BREAST SCREENING (ICD-V76.10) Removed problem of HYPOKALEMIA (ICD-276.8) Removed problem of COUGH, NON-PRODUCTIVE (ICD-786.2) Removed problem of HYPOTENSION (ICD-458.9) Removed problem of PANCREATITIS, CHRONIC (ICD-577.1) Removed problem of History of  MYOCARDIAL INFARCTION, INITIAL EPISODE OF CARE (ICD-410.90)

## 2010-11-23 NOTE — Progress Notes (Signed)
Summary: phn msg  Phone Note Call from Patient Call back at [facility] Phone [phone]   Caller: Patient Summary of Call: Pt said Dr. wanted to know how much pancreas med she was taking and it is 1500 ucer 1tab 3xs a day with meals. Initial call taken by: Clydell Hakim,  February 03, 2010 11:24 AM    New/Updated Medications: PANCREAZE 57846 UNIT CPEP (PANCRELIPASE (LIP-PROT-AMYL)) One tab by mouth TID  Appended Document: phn msg spoke with pt about meds she understood and will follow dosing instructions
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
## 2010-11-23 NOTE — Progress Notes (Signed)
Summary: refill  Phone Note Refill Request Call back at Home Phone [phone] Message from:  Patient  Refills Requested: Medication #1:  FAMOTIDINE 20 MG TABS Take 1 tablet by mouth twice a day  Medication #2:  PLAVIX 75 MG TABS Take 1 tablet by mouth once a day  Medication #3:  METFORMIN HCL 500 MG TABS 1 tab by mouth daily Initial call taken by: De Nurse,  February 16, 2010 8:34 AM    Prescriptions: PLAVIX 75 MG TABS (CLOPIDOGREL BISULFATE) Take 1 tablet by mouth once a day  #31 x 3   Entered and Authorized by:   Rodney Langton MD   Signed by:   Rodney Langton MD on 02/17/2010   Method used:   Faxed to ...       [facility] (retail)       [address]       [place], [place]  [address]       Ph: [phone]       Fax: [phone]   RxID:   [id number] METFORMIN HCL 500 MG TABS (METFORMIN HCL) 1 tab by mouth daily  #31 x 11   Entered and Authorized by:   Rodney Langton MD   Signed by:   Rodney Langton MD on 02/17/2010   Method used:   Faxed to ...       [facility] (retail)       [address]       [place], [place]  [address]       Ph: [phone]       Fax: [phone]   RxID:   [id number] FAMOTIDINE 20 MG TABS (FAMOTIDINE) Take 1 tablet by mouth twice a day  #60 x 3   Entered and Authorized by:   Rodney Langton MD   Signed by:   Rodney Langton MD on 02/17/2010   Method used:   Faxed to ...       [facility] (retail)       [address]       [place], [place]  [address]       Ph: [phone]       Fax: [phone]   RxID:   [id number]

## 2010-11-23 NOTE — Letter (Signed)
Summary: Appt Reminder 2  [facility]  [address] [place], [place] [address]   Phone: [phone]  Fax: [phone]        June 17, 2010 MRN: [id number]    [place] [address] [place], [place]  [address]    Dear Ms. Baena,   You have a return appointment with Catha Brow on 08-16-10 at 11:30am. Please remember to bring a complete list of the medicines you are taking, your insurance card and your co-pay.  If you have to cancel or reschedule this appointment, please call before 5:00 pm the evening before to avoid a cancellation fee.  If you have any questions or concerns, please call [phone].    Sincerely,    Laureen Ochs LPN  Appended Document: Appt Reminder 2 Letter mailed to patient.

## 2010-11-23 NOTE — Progress Notes (Signed)
Summary: phn msg  Phone Note Call from Patient Call back at [facility] Phone [phone]   Caller: Patient Summary of Call: Pt says that she left some papers here for the doctor for her to get [employer] from [employer].  Pt says [employer] has not recieved them yet. Initial call taken by: Clydell Hakim,  April 21, 2010 9:33 AM  Follow-up for Phone Call        to PCP Follow-up by: Gladstone Pih,  April 21, 2010 9:38 AM  Additional Follow-up for Phone Call Additional follow up Details #1::        I have already faxed them off, please have them send papers again if they did not recieve it. Additional Follow-up by: Rodney Langton MD,  April 21, 2010 10:08 AM    forward to pcp.Loralee Pacas CMA  April 21, 2010 9:38 AM

## 2010-11-23 NOTE — Letter (Signed)
Summary: *Referral Letter Cardiology  [facility]  [address]   [place], [place] [address]   Phone: [phone]  Fax: [phone]    03/19/2010  Thank you in advance for agreeing to see my patient:  [name] Foley [address] [place], [place]  [address]  Phone: [phone]  Reason for Referral: Pre-operative clearance.  65 year old female going for tooth extraction under general anesthesia.  Has a history of MI in 2006 with DES placed in LAD.  Has preserved LV function on an ECHO in 2008.  Asymptomatic with >4 mets exercise tolerance.   Procedures Requested:  Cardiac stress test.  Current Medications: 1)  FAMOTIDINE 20 MG TABS (FAMOTIDINE) Take 1 tablet by mouth twice a day 2)  METFORMIN HCL 500 MG TABS (METFORMIN HCL) 1 tab by mouth daily 3)  PLAVIX 75 MG TABS (CLOPIDOGREL BISULFATE) Take 1 tablet by mouth once a day 4)  MIRTAZAPINE 15 MG  TABS (MIRTAZAPINE) 1/2  tab by mouth at bedtime 5)  CLONAZEPAM 1 MG TABS (CLONAZEPAM) one tab by mouth qhs 6)  MEGESTROL ACETATE 400 MG/10ML SUSP (MEGESTROL ACETATE) 10mL by mouth daily 7)  PANCREAZE 28413 UNIT CPEP (PANCRELIPASE (LIP-PROT-AMYL)) One tab by mouth three times a day with meals 8)  DIPHENOXYLATE-ATROPINE 2.5-0.025 MG TABS (DIPHENOXYLATE-ATROPINE) One to two tabs by mouth two times a day as needed for diarrhea.  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, 2)   h/o seizures when klonopin/etoh withdrawal, 3)  PSORIASIS (ICD-696.1) 4)  ALCOHOL ABUSE, HX OF (ICD-V11.3) 5)  PANCREATITIS, CHRONIC (ICD-577.1) 6)  BLOOD IN STOOL, OCCULT (ICD-792.1) 7)  TOBACCO DEPENDENCE (ICD-305.1) 8)  Hx of MYOCARDIAL INFARCTION, INITIAL EPISODE OF CARE (ICD-410.90) 9)  INSOMNIA NOS (ICD-780.52) 10)  HYPERTENSION, BENIGN SYSTEMIC (ICD-401.1) 11)  DIABETES MELLITUS II, UNCOMPLICATED (ICD-250.00) 12)  HYPERLIPIDEMIA (ICD-272.4) 13)  CORONARY ARTERY DISEASE (ICD-414.00)  Pertinent Labs:  Lipid Panel and  HbA1c from (02/03/10): TC: 77, HDL: 22, LDL:43, TG: 61, HbA1c: 5.7%.  Thank you again for agreeing to see our patient; please contact us if you have any further questions or need additional information.  Sincerely,  Rodney Langton MD

## 2010-11-23 NOTE — Progress Notes (Signed)
Summary: Ref Needed  Phone Note Call from Patient Call back at Home Phone [phone]   Caller: Patient Summary of Call: Pt says [facility] needs a referral for her to colonoscopy. Initial call taken by: Clydell Hakim,  Feb 22, 2010 9:28 AM  Follow-up for Phone Call        will schedule. Follow-up by: Theresia Lo RN,  Feb 22, 2010 11:11 AM

## 2010-11-23 NOTE — Letter (Signed)
Summary: Anticoagulation Modification Letter  [facility]  [address] [place], [place] [address]   Phone: [phone]  Fax: [phone]    May 04, 2010  Re:    [name] Foley DOB:    [birth year] MRN:  [id number]    Dear Dr. Allyson Sabal  We have scheduled the above patient for an endoscopic procedure with Dr. Leone Payor. Our records show that she is on anticoagulation therapy. Please advise as to how long the patient may come off their therapy of Plavix prior to the scheduled procedure on June 16, 2010.  Please fax the completed form to [place], [place] at [phone].  Thank you for your help with this matter.  Sincerely,  Francee Piccolo CMA Duncan Dull)   Physician Recommendation:  Hold Plavix 7 days prior ________________   Appended Document: Anticoagulation Modification Letter OK per Dr. Garen Lah to hold plavix 7 days prior to colonoscopy.  Notified pt she is to stop plavix on 8/17.  Pt voices understanding and is agreeable.  Hardcopy filed in [employer] chart.

## 2010-11-23 NOTE — Assessment & Plan Note (Signed)
Summary: f/up,tcb   Vital Signs:  Patient profile:   65 year old female Height:      62 inches  Vitals Entered By: Gladstone Pih (February 03, 2010 8:28 AM)  Primary Care Provider:  Rodney Langton MD   History of Present Illness: HLD: Lipids very low in hospital, zocor held.  HTN: BP at goal  DM2:  A1c at goal  Wt Loss:  Has been taking megace, this helps a lot with appetite.  Breast screening: Due for mammogram.  Colon screening:  Due for colonoscopy.  Current Medications (verified): 1)  Famotidine 20 Mg Tabs (Famotidine) .... Take 1 Tablet By Mouth Twice A Day 2)  Metformin Hcl 500 Mg Tabs (Metformin Hcl) .Marland Kitchen.. 1 Tab By Mouth Daily 3)  Plavix 75 Mg Tabs (Clopidogrel Bisulfate) .... Take 1 Tablet By Mouth Once A Day 4)  Mirtazapine 15 Mg  Tabs (Mirtazapine) .... 1/2  Tab By Mouth At Bedtime 5)  Clonazepam 1 Mg Tabs (Clonazepam) .... One Tab By Mouth Qhs 6)  Megestrol Acetate 400 Mg/1ml Susp (Megestrol Acetate) .Marland Kitchen.. 10ml By Mouth Daily  Allergies (verified): 1)  Voltaren  Review of Systems       See HPI  Physical Exam  General:  Well-developed,well-nourished,in no acute distress; alert,appropriate and cooperative throughout examination Head:  Normocephalic and atraumatic without obvious abnormalities.  Eyes:  No corneal or conjunctival inflammation noted. EOMI. Perrla.  Ears:  External ear exam shows no significant lesions or deformities.  Nose:  External nasal examination shows no deformity or inflammation.  Mouth:  Oral mucosa and oropharynx without lesions or exudates.  Lungs:  Normal respiratory effort, chest expands symmetrically. Lungs are clear to auscultation, no crackles or wheezes. Heart:  Normal rate and regular rhythm. S1 and S2 normal without gallop, murmur, click, rub or other extra sounds. Abdomen:  Bowel sounds positive,abdomen soft and non-tender without masses, organomegaly or hernias noted. Extremities:  No clubbing, cyanosis, edema, or  deformity noted with normal full range of motion of all joints.    Diabetes Management Exam:    Foot Exam (with socks and/or shoes not present):       Sensory-Monofilament:          Left foot: normal          Right foot: normal   Impression & Recommendations:  Problem # 1:  SPECIAL SCREENING FOR MALIGNANT NEOPLASMS COLON (ICD-V76.51) Assessment New Ordered colonoscopy.  Orders: Colonoscopy (Colon) [facility]- Est Level  5 (25366)  Problem # 2:  UNSPECIFIED BREAST SCREENING (ICD-V76.10) Assessment: New Mammo ordered.  Orders: Mammogram (Screening) (Mammo) [facility]- Est Level  5 ([address]34)  Problem # 3:  LOSS OF WEIGHT (ICD-783.21) Assessment: Improved Weight much improved with megace, will trend at every visit.  Orders: [facility]- Est Level  5 ([address])  Problem # 4:  HYPERTENSION, BENIGN SYSTEMIC (ICD-401.1) Assessment: Improved Well controlled without norvasc, wll add lisinopril with microalbuminuria.  The following medications were removed from the medication list:    Amlodipine Besylate 5 Mg Tabs (Amlodipine besylate) .Marland Kitchen... 1 tab by mouth daily Her updated medication list for this problem includes:    Lisinopril 2.5 Mg Tabs (Lisinopril) ..... One tab by mouth daily  Orders: Basic Met-[facility] ([address]) [facility]- Est Level  5 ([address])  Problem # 5:  DIABETES MELLITUS II, UNCOMPLICATED (ICD-250.00) Assessment: Improved Well controlled, by mouth microalbuminuria, will add lisinopril low dose. ophtho referral.  Her updated medication list for this problem includes:    Metformin Hcl 500 Mg Tabs (Metformin hcl) .Marland KitchenMarland KitchenMarland KitchenMarland Kitchen  1 tab by mouth daily    Lisinopril 2.5 Mg Tabs (Lisinopril) ..... One tab by mouth daily  Orders: A1C-[facility] ([address]) UA Microalbumin-[facility] (30160) Ophthalmology Referral (Ophthalmology) [facility]- Est Level  5 ([address])  Problem # 6:  HYPERLIPIDEMIA (ICD-272.4) Assessment: Improved Lipids too low, stopping zocor.  The following medications were removed from the medication list:     Simvastatin 40 Mg Tabs (Simvastatin) .Marland Kitchen... 1 tab by mouth daily  Complete Medication List: 1)  Famotidine 20 Mg Tabs (Famotidine) .... Take 1 tablet by mouth twice a day 2)  Metformin Hcl 500 Mg Tabs (Metformin hcl) .Marland Kitchen.. 1 tab by mouth daily 3)  Plavix 75 Mg Tabs (Clopidogrel bisulfate) .... Take 1 tablet by mouth once a day 4)  Mirtazapine 15 Mg Tabs (Mirtazapine) .... 1/2  tab by mouth at bedtime 5)  Clonazepam 1 Mg Tabs (Clonazepam) .... One tab by mouth qhs 6)  Megestrol Acetate 400 Mg/2ml Susp (Megestrol acetate) .Marland Kitchen.. 10ml by mouth daily 7)  Lisinopril 2.5 Mg Tabs (Lisinopril) .... One tab by mouth daily  Other Orders: Lipid-[facility] ([id number])  Patient Instructions: 1)  Great to see you, 2)  Checking bloodwork 3)  You need to get your MAMMOGRAM 4)  COLONOSCOPY 5)  DIABETIC EYE EXAM 6)  Come back to see me in 1 month to ensure that you have gotten these set up and to track your weight. 7)  -Dr. Karie Schwalbe. Prescriptions: LISINOPRIL 2.5 MG TABS (LISINOPRIL) One tab by mouth daily  #30 x 6   Entered and Authorized by:   Rodney Langton MD   Signed by:   Rodney Langton MD on 02/03/2010   Method used:   Faxed to ...       [facility] (retail)       [address]       [place], [place]  [address]       Ph: [phone]       Fax: [phone]   RxID:   [id number] MEGESTROL ACETATE 400 MG/10ML SUSP (MEGESTROL ACETATE) 10mL by mouth daily  #1 bottle x 11   Entered and Authorized by:   Rodney Langton MD   Signed by:   Rodney Langton MD on 02/03/2010   Method used:   Faxed to ...       [facility] (retail)       [address]       [place], [place]  [address]       Ph: [phone]       Fax: [phone]   RxID:   [id number]   Laboratory Results   Urine Tests  Date/Time Received: February 03, 2010 9:39 AM  Date/Time Reported: February 03, 2010 9:55 AM   Microalbumin (urine): abnormal  mg/L Creatinine: 100mg /dL  A:C Ratio 14-431 Abnormal Comments: ...........test performed by...........Marland KitchenTerese Door, CMA   Blood Tests   Date/Time Received: February 03, 2010 8:45 AM  Date/Time Reported: February 03, 2010 9:10 AM   HGBA1C: 5.7%   (Normal Range: Non-Diabetic - 3-6%   Control Diabetic - 6-8%)  Comments: ...............test performed by......Marland KitchenBonnie A. [place], MLS (ASCP)cm     Last HDL:  39 (01/16/2008 10:13:22 PM) HDL Result Date:  01/22/2010 HDL Result:  13 HDL Next Due:  12 wk Last LDL:  38 (01/16/2008 10:13:22 PM) LDL Result Date:  01/22/2010 LDL Result:  24 LDL Next Due:  12 wk Diabetic Foot Exam  Date:  02/03/2010 Diabetes Foot Check Result:  normal Diabetes Foot Check Next Due:  1 yr Last HGBA1C:  6.0 (08/13/2008 9:39:15 AM) HGBA1C Result Date:  02/03/2010 HGBA1C Next Due:  3 mo Microalbumin Result Date:  02/03/2010 Microalbumin Result:  abnormal Microalbumin Next Due:  1 yr Foot Care Education Date:  02/03/2010 Foot Care Education:  completed Foot Care Education Due:  1 yr Self-Mgt EDU Date:  02/03/2010 Self-Mgt EDU:  completed Self-Mgt EDU Next Due:  1 yr Last Creatinine:  0.71 (08/13/2008 9:20:00 PM) Creatinine Result Date: 01/23/2010 Creatinine Result:  0.51 Creatinine Next Due: 1 yr Last Potassium:  4.7 (08/13/2008 9:20:00 PM) Potassium Result Date:  01/23/2010 Potassium Result:  3.8 Potassium Next Due:  1 yr      Diabetic Foot Exam Last Podiatry Exam Date: 02/03/2010  Foot Inspection Is there a history of a foot ulcer?              No Is there a foot ulcer now?              No Can the patient see the bottom of their feet?          Yes Are the shoes appropriate in style and fit?          Yes Is there swelling or an abnormal foot shape?          No Are the toenails long?                No Are the toenails thick?                No Are the toenails ingrown?              No Is there heavy callous build-up?              No Is there  pain in the calf muscle (Intermittent claudication) when walking?    NoIs there a claw toe deformity?              No Is there elevated skin temperature?            No Is there limited ankle dorsiflexion?            No Is there foot or ankle muscle weakness?            No  Diabetic Foot Care Education Patient educated on appropriate care of diabetic feet.  Pulse Check          Right Foot          Left Foot Posterior Tibial:        normal            normal Dorsalis Pedis:        normal            normal  High Risk Feet? No Set Next Diabetic Foot Exam here: 02/04/2011   10-g (5.07) Semmes-Weinstein Monofilament Test Performed by: Rodney Langton MD          Right Foot          Left Foot Visual Inspection               Test Control      normal         normal Site 1         normal         normal Site 2         normal  normal Site 3         normal         normal Site 4         normal         normal Site 5         normal         normal Site 6         normal         normal Site 7         normal         normal Site 8         normal         normal Site 9         normal         normal Site 10         normal         normal  Impression      normal         normal                Nursing Instructions: Diabetic foot exam today

## 2010-11-23 NOTE — Letter (Signed)
Summary: [facility] Instructions  [facility]  [address] [place], [place] [address]   Phone: [phone]  Fax: [phone]       [name] Foley    05-29-1946    MRN: [id number]      Procedure Day Dorna Bloom: [name] Foley, 06/16/10     Arrival Time: 9:00AM      Procedure Time: 10:00 AM    Location of Procedure:                    _X_  [facility] (4th Floor)  PREPARATION FOR COLONOSCOPY WITH MOVIPREP   Starting 5 days prior to your procedure 06/11/10 do not eat nuts, seeds, popcorn, corn, beans, peas,  salads, or any raw vegetables.  Do not take any fiber supplements (e.g. Metamucil, Citrucel, and Benefiber).  THE DAY BEFORE YOUR PROCEDURE         TUESDAY, 06/15/10  1.  Drink clear liquids the entire day-NO SOLID FOOD  2.  Do not drink anything colored red or purple.  Avoid juices with pulp.  No orange juice.  3.  Drink at least 64 oz. (8 glasses) of fluid/clear liquids during the day to prevent dehydration and help the prep work efficiently.  CLEAR LIQUIDS INCLUDE: Water Jello Ice Popsicles Tea (sugar ok, no milk/cream) Powdered fruit flavored drinks Coffee (sugar ok, no milk/cream) Gatorade Juice: apple, white grape, white cranberry  Lemonade Clear bullion, consomm, broth Carbonated beverages (any kind) Strained chicken noodle soup Hard Candy                             4.  In the morning, mix first dose of MoviPrep solution:    Empty 1 Pouch A and 1 Pouch B into the disposable container    Add lukewarm drinking water to the top line of the container. Mix to dissolve    Refrigerate (mixed solution should be used within 24 hrs)  5.  Begin drinking the prep at 5:00 p.m. The MoviPrep container is divided by 4 marks.   Every 15 minutes drink the solution down to the next mark (approximately 8 oz) until the full liter is complete.   6.  Follow completed prep with 16 oz of clear liquid of your choice (Nothing red or purple).  Continue to drink  clear liquids until bedtime.  7.  Before going to bed, mix second dose of MoviPrep solution:    Empty 1 Pouch A and 1 Pouch B into the disposable container    Add lukewarm drinking water to the top line of the container. Mix to dissolve    Refrigerate  Beginning at 9:00 p.m. (5 hours before procedure):         1. Every 15 minutes, drink the solution down to the next mark (approx 8 oz) until the full liter is complete.  2. Follow completed prep with 16 oz. of clear liquid of your choice.    THE DAY OF YOUR PROCEDURE      WEDNESDAY, 06/16/10  1. You may drink clear liquids until 8:00 AM (2 HOURS BEFORE PROCEDURE).  MEDICATION INSTRUCTIONS  Unless otherwise instructed, you should take regular prescription medications with a small sip of water   as early as possible the morning of your procedure.  Diabetic patients - see separate instructions.  Stop taking Plavix or Aggrenox on  _  _  (7 days before procedure).   You will be contacted by  our office prior to your procedure for directions on holding your Plavix.  If you do not hear from our office 1 week prior to your scheduled procedure, please call [phone] to discuss.       OTHER INSTRUCTIONS  You will need a responsible adult at least 64 years of age to accompany you and drive you home.   This person must remain in the waiting room during your procedure.  Wear loose fitting clothing that is easily removed.  Leave jewelry and other valuables at home.  However, you may wish to bring a book to read or  an iPod/MP3 player to listen to music as you wait for your procedure to start.  Remove all body piercing jewelry and leave at home.  Total time from sign-in until discharge is approximately 2-3 hours.  You should go home directly after your procedure and rest.  You can resume normal activities the  day after your procedure.  The day of your procedure you should not:   Drive   Make legal decisions   Operate  machinery   Drink alcohol   Return to work  You will receive specific instructions about eating, activities and medications before you leave.    The above instructions have been reviewed and explained to me by   _______________________    I fully understand and can verbalize these instructions _____________________________ Date _________

## 2010-11-23 NOTE — Miscellaneous (Signed)
  Clinical Lists Changes  Observations: Added new observation of LDL: 38 mg/dL (16/07/9603 54:09) Added new observation of HDL: 39 mg/dL (81/19/1478 29:56) Added new observation of TRIGLYC TOT: 59 mg/dL (21/30/8657 84:69) Added new observation of CHOLESTEROL: 89 mg/dL (62/95/2841 32:44)

## 2010-11-23 NOTE — Initial Assessments (Signed)
Summary: pneumonia, dehydration, hypotension   Vital Signs:  Patient profile:   65 year old female O2 Sat:      100 % on Room air Temp:     97.7 degrees F Pulse rate:   68 / minute Pulse rhythm:   regular Resp:     22 per minute BP supine:   85 / 64  O2 Flow:  Room air  Primary Care Provider:  Rodney Langton MD   History of Present Illness: 65 y/o female with prior h/o EtOH abuse, tobacco abuse, CAD s/p stent, HTN presents with nonproductive cough and decreased appetite x3 weeks (since 3/11). patient denies fever, endorses chills and weakness and some loose stools over the last few days. no chest pain, SOB, abdominal pain, dysuria. patient also reports significant weight loss, unable to quantify. last weight in clinic 11/09 was 169 and patient appears to be at least 40 lbs less than that.   Habits & Providers  Alcohol-Tobacco-Diet     Tobacco Status: current     Tobacco Counseling: to quit use of tobacco products  Current Problems (verified): 1)  Psoriasis  (ICD-696.1) 2)  Alcohol Abuse, Hx of  (ICD-V11.3) 3)  Pancreatitis, Chronic  (ICD-577.1) 4)  Blood in Stool, Occult  (ICD-792.1) 5)  Tobacco Dependence  (ICD-305.1) 6)  Hx of Myocardial Infarction, Initial Episode of Care  (ICD-410.90) 7)  Insomnia Nos  (ICD-780.52) 8)  Hypertension, Benign Systemic  (ICD-401.1) 9)  Diabetes Mellitus II, Uncomplicated  (ICD-250.00) 10)  Hyperlipidemia  (ICD-272.4) 11)  Coronary Artery Disease  (ICD-414.00)  Current Medications (verified): 1)  Famotidine 20 Mg Tabs (Famotidine) .... Take 1 Tablet By Mouth Twice A Day 2)  Metformin Hcl 500 Mg Tabs (Metformin Hcl) .Marland Kitchen.. 1 Tab By Mouth Daily 3)  Plavix 75 Mg Tabs (Clopidogrel Bisulfate) .... Take 1 Tablet By Mouth Once A Day 4)  Simvastatin 40 Mg  Tabs (Simvastatin) .Marland Kitchen.. 1 Tab By Mouth Daily 5)  Amlodipine Besylate 5 Mg  Tabs (Amlodipine Besylate) .Marland Kitchen.. 1 Tab By Mouth Daily 6)  Mirtazapine 15 Mg  Tabs (Mirtazapine) .... 1/2  Tab By  Mouth At Bedtime 7)  Clonazepam 1 Mg Tabs (Clonazepam) .... One Tab By Mouth Qhs  Allergies (verified): 1)  Voltaren  Past History:  Past medical, surgical, family and social histories (including risk factors) reviewed, and no changes noted (except as noted below).  Past Medical History: 85% stenosis of the LAD-PCTA,  h/o seizures when klonopin/etoh withdrawal, PSORIASIS (ICD-696.1) ALCOHOL ABUSE, HX OF (ICD-V11.3) PANCREATITIS, CHRONIC (ICD-577.1) BLOOD IN STOOL, OCCULT (ICD-792.1) TOBACCO DEPENDENCE (ICD-305.1) Hx of MYOCARDIAL INFARCTION, INITIAL EPISODE OF CARE (ICD-410.90) INSOMNIA NOS (ICD-780.52) HYPERTENSION, BENIGN SYSTEMIC (ICD-401.1) DIABETES MELLITUS II, UNCOMPLICATED (ICD-250.00) HYPERLIPIDEMIA (ICD-272.4) CORONARY ARTERY DISEASE (ICD-414.00)  Past Surgical History: TAH for fibroids 1994 - 12/30/2005  Family History: Reviewed history from 12/21/2006 and no changes required. 2 sisters-a+w, brother-cad,esrd, dad- liver cirrhosis, mom-dm, colon resection  Social History: Reviewed history from 03/21/2008 and no changes required. lives in [place], single. No kids.  Smokes 1/2 ppd. Significant h/o alcohol abuse w/ multiple admissions for pancreatitis.  Quit (per pt) 4/09  Review of Systems       complete 12 patient ROS as per HPI, otherwise negative  Physical Exam  General:  cachetic female, NAD. lying in hospital bed awake and alert. Eyes:  EOMI, PERRLA, no scleral icterus Ears:  hearing grossly normal Nose:  no rhinorrhea Mouth:  dry mucous membranes. poor dentition with teeth missing.  Neck:  No deformities,  masses, or tenderness noted. Lungs:  no wheezing. +rales at R base. normal WOB. no accessory muscle use.  Heart:  Normal rate and regular rhythm. S1 and S2 normal without gallop, murmur, click, rub or other extra sounds. Abdomen:  cachetic. Bowel sounds positive,abdomen soft and non-tender without masses, organomegaly or hernias noted. Pulses:  2+  distal pulses bilaterally Extremities:  no edema of BLE Neurologic:  alert & oriented X3 and cranial nerves II-XII intact.   Skin:  no rashes Psych:  Oriented X3 and memory intact for recent and remote.   Additional Exam:  Labs:   CXR: vague increased density in RUL, RLL may reflect early bronchopneumonia. peribronchial thickening, increased interstitial markings and streaky areas of atelectasis suggest interstitial pneumonitis vs. bronchitis.   Na 138, K 2.8, Cl 106, CO2 24, BUN 10, Cr 0.7, Gluc 83, Ca 8.0 Lactic acid 2.5 POC cardiac markers neg x1 WBC 8.1, Hgb 11.9, Hct 56.9, Plts 359, MCV 73.1  U/A pending   Impression & Recommendations:  Problem # 1:  HYPOTENSION (ICD-458.9) Assessment New likely 2/2 dehydration from decreased oral intake, some loose stools, with continued med use at home. some improvement with fluid bolus of 500 ml. will repeat another bolus of 500 ml NS. run IVFs at 100 ml/hr. BMP with normal renal function, so reassuring. hold amlodipine.   Problem # 2:  COUGH, NON-PRODUCTIVE (ICD-786.2) Assessment: New xray and PE findings consistent with pneumonia, although patient afebrile with normal WBCs and no oxygen requirement. received ceftriaxone/azithromycin. will continue.   Problem # 3:  HYPOKALEMIA (ICD-276.8) Assessment: New likely 2/2 loose stools. will replete with by mouth KCL 40 meq q3 hours x3. recheck BMP at 1800  Problem # 4:  ALCOHOL ABUSE, HX OF (ICD-V11.3) Assessment: New patient reports being sober for >1 year. check hepatic function panel  Problem # 5:  LOSS OF WEIGHT (ICD-783.21) Assessment: Deteriorated prior PCP concerned for malignancy. doubt all weight loss 2/2 current illness and decreased appetite. last mammogram 2009 was normal. Pt has not had colonoscopy. h/o heme positive stool, mild anemia today, and low MCV. weight loss could also be 2/2 chronic pancreatitis. will check HIV. needs further w/u as outpt.   Problem # 6:  PANCREATITIS,  CHRONIC (ICD-577.1) Assessment: Unchanged patient has been off pancrease for awhile since has been lost to f/u. consider resuming.   Problem # 7:  TOBACCO DEPENDENCE (ICD-305.1) Assessment: Unchanged patient with decreased smoking 2/2 illness. smoking cessation consult.  Problem # 8:  HYPERTENSION, BENIGN SYSTEMIC (ICD-401.1) hypotensive. hold meds.   Her updated medication list for this problem includes:    Amlodipine Besylate 5 Mg Tabs (Amlodipine besylate) .Marland Kitchen... 1 tab by mouth daily  Problem # 9:  DIABETES MELLITUS II, UNCOMPLICATED (ICD-250.00) last A1C 6 in October 2009. will hold metformin for now. check A1C.   The following medications were removed from the medication list:    Bayer Aspirin 325 Mg Tabs (Aspirin) .Marland Kitchen... Take 1 tablet by mouth once a day Her updated medication list for this problem includes:    Metformin Hcl 500 Mg Tabs (Metformin hcl) .Marland Kitchen... 1 tab by mouth daily  Problem # 10:  HYPERLIPIDEMIA (ICD-272.4) check fasting lipid panel. continue simvastatin.   Her updated medication list for this problem includes:    Simvastatin 40 Mg Tabs (Simvastatin) .Marland Kitchen... 1 tab by mouth daily  Problem # 11:  FEN/GI heart healthy diet. 500 ml bolus NS, IVFs D5NS@100  ml/hr. replete K as above.   Problem # 12:  Prophylaxis heparin,  H2 blocker  DISPOSITION: full code. pending improvement in BP, transition to oral abx.

## 2010-11-25 ENCOUNTER — Ambulatory Visit: Payer: Self-pay | Admitting: Internal Medicine

## 2010-11-25 ENCOUNTER — Ambulatory Visit: Admit: 2010-11-25 | Payer: Self-pay | Admitting: Internal Medicine

## 2011-01-07 LAB — GLUCOSE, CAPILLARY
Glucose-Capillary: 79 mg/dL (ref 70–99)
Glucose-Capillary: 92 mg/dL (ref 70–99)

## 2011-01-12 LAB — GLUCOSE, CAPILLARY
Glucose-Capillary: 152 mg/dL — ABNORMAL HIGH (ref 70–99)
Glucose-Capillary: 94 mg/dL (ref 70–99)

## 2011-01-12 LAB — COMPREHENSIVE METABOLIC PANEL
BUN: 7 mg/dL (ref 6–23)
CO2: 22 mEq/L (ref 19–32)
Calcium: 7.7 mg/dL — ABNORMAL LOW (ref 8.4–10.5)
Creatinine, Ser: 0.56 mg/dL (ref 0.4–1.2)
GFR calc non Af Amer: >60 mL/min (ref >60)
Glucose, Bld: 104 mg/dL — ABNORMAL HIGH (ref 70–99)
Total Bilirubin: 0.7 mg/dL (ref 0.3–1.2)

## 2011-01-12 LAB — FERRITIN: Ferritin: 377 ng/mL — ABNORMAL HIGH (ref 10–291)

## 2011-01-12 LAB — LIPID PANEL
Total CHOL/HDL Ratio: 3.5 RATIO
Triglycerides: 43 mg/dL (ref <150)
VLDL: 9 mg/dL (ref 0–40)

## 2011-01-12 LAB — MAGNESIUM
Magnesium: 1.4 mg/dL — ABNORMAL LOW (ref 1.5–2.5)
Magnesium: 1.4 mg/dL — ABNORMAL LOW (ref 1.5–2.5)
Magnesium: 2.1 mg/dL (ref 1.5–2.5)

## 2011-01-12 LAB — CBC
HCT: 29.2 % — ABNORMAL LOW (ref 36.0–46.0)
Hemoglobin: 9.5 g/dL — ABNORMAL LOW (ref 12.0–15.0)
Hemoglobin: 9.9 g/dL — ABNORMAL LOW (ref 12.0–15.0)
MCHC: 32.6 g/dL (ref 30.0–36.0)
MCHC: 32.9 g/dL (ref 30.0–36.0)
MCV: 72.8 fL — ABNORMAL LOW (ref 78.0–100.0)
MCV: 73.6 fL — ABNORMAL LOW (ref 78.0–100.0)
RBC: 3.96 MIL/uL (ref 3.87–5.11)
RBC: 4.13 MIL/uL (ref 3.87–5.11)
RDW: 17.6 % — ABNORMAL HIGH (ref 11.5–15.5)

## 2011-01-12 LAB — BASIC METABOLIC PANEL
CO2: 25 mEq/L (ref 19–32)
Calcium: 8 mg/dL — ABNORMAL LOW (ref 8.4–10.5)
Chloride: 109 mEq/L (ref 96–112)
Glucose, Bld: 88 mg/dL (ref 70–99)
Sodium: 139 mEq/L (ref 135–145)

## 2011-01-12 LAB — HIV ANTIBODY (ROUTINE TESTING W REFLEX): HIV: NONREACTIVE

## 2011-01-12 LAB — HEMOGLOBIN A1C: Hgb A1c MFr Bld: 5.9 % (ref 4.6–6.1)

## 2011-01-12 LAB — IRON AND TIBC: TIBC: 116 ug/dL — ABNORMAL LOW (ref 250–470)

## 2011-01-14 LAB — HEPATIC FUNCTION PANEL
AST: 68 U/L — ABNORMAL HIGH (ref 0–37)
Albumin: 2.6 g/dL — ABNORMAL LOW (ref 3.5–5.2)
Alkaline Phosphatase: 242 U/L — ABNORMAL HIGH (ref 39–117)
Total Protein: 8 g/dL (ref 6.0–8.3)

## 2011-01-14 LAB — BASIC METABOLIC PANEL
BUN: 7 mg/dL (ref 6–23)
CO2: 24 mEq/L (ref 19–32)
CO2: 25 mEq/L (ref 19–32)
Calcium: 8 mg/dL — ABNORMAL LOW (ref 8.4–10.5)
Chloride: 107 mEq/L (ref 96–112)
Creatinine, Ser: 0.7 mg/dL (ref 0.4–1.2)
Creatinine, Ser: 0.75 mg/dL (ref 0.4–1.2)
GFR calc Af Amer: >60 mL/min (ref >60)
GFR calc Af Amer: >60 mL/min (ref >60)
GFR calc non Af Amer: >60 mL/min (ref >60)
Glucose, Bld: 84 mg/dL (ref 70–99)
Sodium: 138 mEq/L (ref 135–145)

## 2011-01-14 LAB — URINALYSIS, ROUTINE W REFLEX MICROSCOPIC
Hgb urine dipstick: NEGATIVE
Ketones, ur: NEGATIVE mg/dL
Nitrite: NEGATIVE
Protein, ur: 30 mg/dL — AB
Urobilinogen, UA: 1 mg/dL (ref 0.0–1.0)

## 2011-01-14 LAB — GLUCOSE, CAPILLARY

## 2011-01-14 LAB — CULTURE, BLOOD (ROUTINE X 2): Culture: NO GROWTH

## 2011-01-14 LAB — DIFFERENTIAL
Basophils Relative: 1 % (ref 0–1)
Lymphocytes Relative: 21 % (ref 12–46)
Lymphs Abs: 1.7 10*3/uL (ref 0.7–4.0)
Monocytes Absolute: 0.7 10*3/uL (ref 0.1–1.0)
Monocytes Relative: 8 % (ref 3–12)
Neutro Abs: 5.5 10*3/uL (ref 1.7–7.7)
Neutrophils Relative %: 67 % (ref 43–77)

## 2011-01-14 LAB — CBC
Hemoglobin: 11.9 g/dL — ABNORMAL LOW (ref 12.0–15.0)
MCHC: 32.1 g/dL (ref 30.0–36.0)
RBC: 5.05 MIL/uL (ref 3.87–5.11)
WBC: 8.1 10*3/uL (ref 4.0–10.5)

## 2011-01-14 LAB — URINE CULTURE
Colony Count: NO GROWTH
Culture: NO GROWTH

## 2011-01-14 LAB — POCT CARDIAC MARKERS
CKMB, poc: <1 ng/mL — ABNORMAL LOW (ref 1.0–8.0)
Myoglobin, poc: 81.9 ng/mL (ref 12–200)
Troponin i, poc: <0.05 ng/mL (ref 0.00–0.09)

## 2011-01-14 LAB — URINE MICROSCOPIC-ADD ON

## 2011-02-14 ENCOUNTER — Other Ambulatory Visit: Payer: Self-pay | Admitting: Sports Medicine

## 2011-02-14 NOTE — Telephone Encounter (Signed)
Refill request

## 2011-03-02 ENCOUNTER — Other Ambulatory Visit: Payer: Self-pay | Admitting: Sports Medicine

## 2011-03-02 DIAGNOSIS — K861 Other chronic pancreatitis: Secondary | ICD-10-CM

## 2011-03-02 MED ORDER — PANCRELIPASE (LIP-PROT-AMYL) 24000-76000 UNITS PO CPEP: 1.0000 | ORAL_CAPSULE | Freq: Three times a day (TID) | ORAL | Status: DC

## 2011-03-02 NOTE — Assessment & Plan Note (Signed)
Note Pancreazenot covered, will change to medicaid preferred creon.

## 2011-03-08 NOTE — Consult Note (Signed)
NAME:  [name] Foley, [name] Foley              ACCOUNT NO.:  [id number]   MEDICAL RECORD NO.:  [id number]          PATIENT TYPE:  INP   LOCATION:  2907                         FACILITY:  [facility]   PHYSICIAN:  Graylin Shiver, M.D.   DATE OF BIRTH:  03/13/1946   DATE OF CONSULTATION:  01/16/2008  DATE OF DISCHARGE:                                 CONSULTATION   REASON FOR CONSULTATION:  Acute pancreatitis.   REFERRING PHYSICIAN:  Richard A. Alanda Amass, M.D.   HISTORY OF PRESENT ILLNESS:  [name] Foley is a 65 year old, African  American female with a known history of alcohol abuse and chronic  pancreatitis.  She was discharged from [facility] on November 26, 2007,  after an episode of acute on chronic pancreatitis.  The patient went  home.  She felt better until yesterday when she had a couple of  alcoholic beverages and felt chest pain that extended into her left  shoulder.  Over time, this pain migrated into her mid abdomen and was  associated with bloating and firmness.  The patient denies any vomiting,  change in bowel habits.  She does report weight loss of over 20 pounds  over the last several months.  She tells me she is currently hungry.  The patient has had pancreatic duct dilatation for over 1 year and now  common bile duct dilatation as well.   PAST MEDICAL HISTORY:  1. Coronary artery disease.  She is has a history of MI and she is      status post catheterization.  2. Hypertension,.  3. Hyperlipidemia.  4. Type 2 diabetes.  5. Chronic pancreatitis.  6. Anxiety.  7. Depression.  8. Insomnia.  9. Alcohol abuse.  10.Tobacco abuse.   PAST SURGICAL HISTORY:  1. Hysterectomy in 1994.  2. Cholecystectomy in 1976.  3. 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   PRIMARY CARE PHYSICIAN:  Her primary care physician is Dr. Johney Maine.   CURRENT MEDICATIONS:  Pepcid, metoprolol, Plavix, aspirin and  simvastatin.   ALLERGIES:  No  known drug allergies.   REVIEW OF SYSTEMS:  Significant for weight loss.  At home, she tells me  that she is up and about and has been doing her spring cleaning.   SOCIAL HISTORY:  Positive for tobacco.  Positive for alcohol use.  She  denies recreational drugs, but does have a past history of using these.   FAMILY HISTORY:  Negative for pancreatic or colon cancer.  Her father  had cirrhosis and liver cancer.   PHYSICAL EXAMINATION:  GENERAL:  She is alert and oriented.  She is in  no apparent distress.  She is very pleasant to speak with.  VITAL SIGNS:  Pulse is 56, respirations 22, blood pressure is 120/71.  CARDIAC:  Regular rate and rhythm.  LUNGS:  Clear to auscultation, although she does have a cough which is a  long-term cough that she refers to as a smoker's cough.  ABDOMEN:  Soft,  mild to moderately distended and firm in the  upper abdomen.  Good bowel  sounds.  Some tenderness in the epigastrium.   LABORATORY DATA AND X-RAY FINDINGS:  Lipase of 184.  Hemoglobin of 11.7,  MCV value of 74, hematocrit 36.6, white count 6.8, platelets 357,000.  LFTs are normal.  BMET is normal.  Coagulations are normal.  Her  troponin is 1.53 this morning at 8:35 a.m.  Her BNP is 41.   CT of her abdomen done in May 2008, showed calcifications in the  uncinate process in the head as well as the tail of the pancreas.  It  also showed pancreatic ductal dilatation.  Her CT scan done November 22, 2007, showed interval development of fluid around the pancreatic head,  extending into the lesser sac and transverse mesocolon.  However, at  that time, there are no definite pancreatic fluid collections that were  drainable and there was no definite pancreatic necrosis.  Ultrasound of  her abdomen on November 25, 2007, showed a common bile duct that was 11-  mm in diameter as well as a fatty liver.  Further, it showed dilated  pancreatic duct and bilateral pleural effusions.  Today, she has had an   ultrasound done that has not been read yet.   ASSESSMENT:  Dr. Wandalee Ferdinand has seen and examined the patient, collected  a history, reviewed her chart.  His impression is that this is a 61-year-  old female with a long history of alcohol abuse who is unfortunately  still drinking alcohol.  She has recurrent episodes of pain with her  chronic pancreatitis.  Further, she has double duct sign which is  concerning for pancreatic cancer.  This admission, she also has elevated  cardiac enzymes.   RECOMMENDATIONS:  Agree with keeping her n.p.o. and giving her  hydration.  Will order a CT scan to look for signs of pancreatic cancer.  Rule out any necrosis or drainable fluid collection.   Thanks very much for this consultation.  We will follow her with you.      Stephani Police, PA    ______________________________  Graylin Shiver, M.D.    MLY/MEDQ  D:  01/16/2008  T:  01/17/2008  Job:  [id number]   cc:   Gerlene Burdock A. Alanda Amass, M.D.  Johney Maine, M.D.

## 2011-03-08 NOTE — Discharge Summary (Signed)
NAME:  Foley, [name]              ACCOUNT NO.:  [id number]   MEDICAL RECORD NO.:  [id number]          PATIENT TYPE:  INP   LOCATION:  3739                         FACILITY:  [facility]   PHYSICIAN:  Raymon Mutton, P.A. DATE OF BIRTH:  07-15-1946   DATE OF ADMISSION:  03/04/2007  DATE OF DISCHARGE:  03/07/2007                               DISCHARGE SUMMARY   DISCHARGE DIAGNOSES:  1. Chest pain - etiology undetermined, status post catheterization      during this hospitalization - recommendation is to continue medical      therapy.  2. Known coronary artery disease status post left anterior descending      stent in February of 2007.  3. Treated hypertension.  4. Treated non-insulin dependent diabetes mellitus.  5. History of alcohol abuse with delirium tremens in the past.  6. History of smoking.   HISTORY OF PRESENT ILLNESS/HOSPITAL COURSE:  This is a 65 year old  African-American female who has a known history of coronary disease and  in 2007 underwent stenting with a drug-eluting stent of the LAD.  She  also has history of dyslipidemia, hypertension, diabetes mellitus,  ethanol abuse, and substance abuse.  The patient presented to the  emergency room with recurrent chest pain leading to the [facility] admission.  She was ruled out for myocardial infarction by  serial enzymes, underwent CT of the chest that did not show any thoracic  or aortic aneurysm or dissection and pulmonary embolism was identified.  CT of the abdomen showed no evidence of abdominal aortic aneurysm, but  the result of abnormal pancreatic imaging was suggestive of chronic  pancreatitis.  Dystrophic pancreatic calcifications were present in the  lumbar spine study.  Even in 2005, there was a diffuse dilation of the  main pancreatic duct and intra and extrahepatic biliary ducts.  There  was also multiple stones seen in the region of the ampulla and  obstructing intraductal stone could not be excluded.   Left adrenal gland  revealed adenoma.   For a definitive diagnosis, the patient was scheduled for coronary  angiography, and procedure was performed on Mar 06, 2007, by Dr. Tresa Endo.  Cath revealed patent LAD stent and there is no critical coronary artery  disease in the proximal to distal portion of the LAD and 20% proximal  RCA stenosis.  The patient also has completely occluded right external  iliac artery.   The recommendation was to continue with medical therapy.  The next  morning, the patient was free of disease and groin side on the left did  not reveal any hematoma or ecchymosis.  She ambulated without  difficulty.   We ordered stat CBC and BMP this morning to make sure that the renal  function and hemoglobin remains stable for his cath, and the patient can  be discharged home pending normal results of CBC and BMP.   DISCHARGE MEDICATIONS:  1. Aspirin 81 mg daily.  2. Lopressor 25 mg b.i.d.  3. Plavix 75 mg daily.  4. Zocor 40 mg daily.  5. Metformin 500 mg daily.  6. Wellbutrin and Klonopin.  The  patient was advised to resume home      doses.   DISCHARGE DIET:  Low-fat, low-cholesterol diet.   DISCHARGE ACTIVITY:  The patient was instructed not to drive or lift  weights greater than 5 pounds for 3 days post cath.   DISCHARGE FOLLOWUP:  Dr. Marina Goodell will see the patient on March 28, 2007, at  10:30 a.m. in the office in [place].      Raymon Mutton, P.A.     MK/MEDQ  D:  03/07/2007  T:  03/07/2007  Job:  [id number]   cc:   [name] Foley, M.D.

## 2011-03-08 NOTE — Assessment & Plan Note (Signed)
[facility]   NAME:  Foley, [name]              ACCOUNT NO.:  [id number]   MEDICAL RECORD NO.:  [id number]      DATE OF BIRTH:  1946/03/12   PHYSICIAN:  Theresia Majors. Tanda Rockers, M.D.      VISIT DATE:                                   OFFICE VISIT   SUBJECTIVE:  [name] Foley is a 65 year old lady whom we are following for  a traumatic injury on the anterior surface of the right thigh.  In the  interim she has continued to use topical Neosporin and antiseptic soap.  She denies interim fever.  She has had an interim re-injury to the wound  which resulted in some bleeding, but there was no excessive pain.   OBJECTIVE:  Blood pressure is 140/69, respirations 18, pulse rate 90,  temperature 98.6.  Inspection of the wound shows that there is a 100%  healthy appearing granulating wound with advancing epithelium from the  periphery.  There was no drainage.  There was no abscess formation.  The  wound is freely mobile and is nontender on exam.   ASSESSMENT:  Resolving wound.   PLAN:  We have instructed the patient to continue antiseptic soap washes  and a dry dressing.  We will reevaluate her in one month p.r.n.      Harold A. Tanda Rockers, M.D.  Electronically Signed     HAN/MEDQ  D:  03/01/2007  T:  03/01/2007  Job:  [id number]

## 2011-03-08 NOTE — Cardiovascular Report (Signed)
NAME:  [name] Foley, [name] Foley              ACCOUNT NO.:  [id number]   MEDICAL RECORD NO.:  [id number]          PATIENT TYPE:  INP   LOCATION:  3739                         FACILITY:  [facility]   PHYSICIAN:  Nicki Guadalajara, M.D.     DATE OF BIRTH:  1946-05-23   DATE OF PROCEDURE:  03/06/2007  DATE OF DISCHARGE:                            CARDIAC CATHETERIZATION   INDICATIONS:  Ms. Beshears is a 65 year old patient of Dr. Allyson Sabal who is  status post stenting of her left anterior descending artery in March  2007.  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  She has a history of dyslipidemia, hypertension, diabetes mellitus, and  EtOH.  There is also a history of tobacco use.  The patient has  developed recurrent episodes of chest pain leading to [facility]  Hospitalization.  She is scheduled for definitive diagnostic cardiac  catheterization.   PROCEDURE:  After premedication with initially Valium 5 mg, the patient  still required additional medication for sedation.  She ultimately  received fentanyl IV 25 mg x2 and also an additional 2 mg of Valium  during the procedure.  Catheterization was performed via the left groin  and a 5-French arterial sheath was inserted.  Diagnostic catheterization  was done utilizing 5-French Judkins for left and right coronary  catheters.  We administered 200 mcg intracoronary nitroglycerin down the  left anterior descending artery.   A pigtail catheter was used for biplane cine left ventriculography.  Distal aortography was also performed as was aortobifemoral runoff with  additional injection being made above the iliac bifurcation to further  evaluate her peripheral vascular disease.  She tolerated the procedure  well.  She returned to her room in satisfactory condition.   HEMODYNAMIC DATA:  Central aortic pressure was 127/72.  Left ventricle  pressure is 127/10, post A-wave 19.   ANGIOGRAPHIC DATA:  Left main coronary artery was  angiographically  normal and bifurcated into an LAD and left circumflex system.   The LAD gave rise to a proximal diagonal and septal perforating artery.  Just beyond this segment there was a previously placed Taxus stent which  was placed in 2007 by Dr. Alanda Amass.  This stent was widely patent.  The  distal aspect of this stent ended prior to the takeoff of the second  diagonal vessel.  After the takeoff of the small third diagonal vessel,  there was 10-20% smooth narrowing.  Following IC nitroglycerin  administration, there was complete resolution of this prior 20%  narrowing and the LAD was normal.   The circumflex vessel gave rise to one major marginal vessel.   The right coronary was a large-caliber vessel that had 20% smooth  proximal stenosis and gave rise to a PDA and small distal RCA system.   Biplane cine left ventriculography revealed preserved global  contractility without significant focal segmental wall motion  abnormality.   Distal aortography did not demonstrate any renal artery stenosis.  An  aortobifemoral runoff again demonstrated total external iliac artery  occlusion just after the takeoff of the right internal iliac artery with  reconstitution via collaterals at  the SFA profunda junction.   RECOMMENDATIONS:  Medical therapy.           ______________________________  Nicki Guadalajara, M.D.     TK/MEDQ  D:  03/06/2007  T:  03/06/2007  Job:  [id number]

## 2011-03-08 NOTE — H&P (Signed)
NAME:  [name] Foley, [name] Foley              ACCOUNT NO.:  [id number]   MEDICAL RECORD NO.:  [id number]          PATIENT TYPE:  INP   LOCATION:  5126                         FACILITY:  [facility]   PHYSICIAN:  Pearlean Brownie, M.D.DATE OF BIRTH:  Feb 28, 1946   DATE OF ADMISSION:  11/22/2007  DATE OF DISCHARGE:                              HISTORY & PHYSICAL   CHIEF COMPLAINT:  Abdominal pain.   HISTORY OF PRESENT ILLNESS:  Patient is a 65 year old African-American  female with history of alcohol abuse, hypertension, diabetes, coronary  artery disease, who developed abdominal pain on Sunday that has worsened  daily.  She noted abdominal swelling on Tuesday.  She denies any nausea,  vomiting or diarrhea, denies bright red blood per rectum, denies melena,  denies dizziness, denies dysuria, denies hematuria and denies fever.  She has never had this before.  She last drank alcohol over the  holidays, has had a decreased appetite since Sunday, did eat some  chicken soup this morning and drank contrast for a CT today.  She  usually drinks two 16-ounce beers at a time.   REVIEW OF SYSTEMS:  Negative for cough, chest pain, shortness of breath.   Patient was given Zofran times one, Dilaudid 1 mg times two in the  emergency department.   PAST MEDICAL HISTORY:  1. Coronary artery disease, status post LAD stent, in February of      20 07.  2. Hypertension.  3. Diabetes.  4. History of smoking abuse.  5. History of alcohol abuse with delirium tremens in the past.  6. Dyslipidemia.  7. Substance abuse history.  8. Insomnia.  9. History of Hemoccult-positive stools.  10.Depression.  11.Anxiety.   Past echo on February 2003 showed an EF of 55-65%.   PAST SURGICAL HISTORY:  1. Hysterectomy in 1994.  2. Gallbladder surgery in 1976.  3. Cardiac catheterization by Dr. Allyson Sabal.  4. Open thrombectomy of femoral artery.   ALLERGIES:  Include VOLTAREN, but the patient says she is not allergic  to his.   However, her records say she is.   MEDICATIONS:  Patient uses [facility].  1. Aspirin 81 mg daily.  2. Plavix 75 mg daily.  3. Famotidine 20 mg b.i.d.  4. Trazodone one to two tablets q.h.s.  5. Metformin 500 mg b.i.d.  6. Nitroglycerin p.r.n., but has not taken this in one and a half      years.  7. Metoprolol 25 mg b.i.d.  8. Simvastatin one tablet daily q.h.s.  She does not know the dose.   SOCIAL HISTORY:  Patient smokes a half pack per day and has so for 10-15  years.  Denies cocaine, marijuana or other illegal drugs.  She lives by  herself.  Her sisters and her niece come by occasionally to do stuff for  her.  She has disability for her back; she hurt it many years ago.  Prior to this time, she worked as a Advertising copywriter.   FAMILY HISTORY:  Mom:  Diabetes.  Brother:  Dialysis and diabetes.  Dad:  Cirrhosis of the liver and alcohol abuse.  There is no cancer  in the  family.   PHYSICAL EXAM:  VITAL SIGNS:  Temperature 98.8, heart rate 93,  respiratory rate 20, blood pressure 128/83, satting 92% on room air.  At  [facility], she was febrile to 101.3, which then went down to 100.5.  Blood pressure was stable.  She was satting 93-95%, heart rate was 106-  110, respiratory rate was 18-20.  IN GENERAL:  The patient is alert, no acute distress, lying in the bed.  HEENT:  Pupils were equal, round and reactive to light.  Extraocular  muscles are intact.  Tongue is dry and gray-colored on the top.  CARDIOVASCULAR/HEART:  Regular rate and rhythm, no murmurs, rubs or  gallops.  PULMONARY:  Slight crackles, right mid-posterior lung field.  Decreased  lung sounds at left lung base.  ABDOMEN:  Distended, soft.  Positive for abdominal tenderness in the  right upper quadrant, positive for guarding.  EXTREMITIES:  No edema, +1 right pedal pulse, +2 left pedal pulse, +2  radial pulses.  NEUROLOGIC:  Alert and oriented times three.  Good strength in bilateral  upper and lower  extremities.   LABORATORY DATA:  Lipase 933.  Urinalysis:  Many squamous epithelial  cells, 3-6 white blood cells, few bacteria, specific gravity 1.033,  moderate bilirubin, greater than 80 ketones, protein 100, small  leukocyte esterase, nitrite negative.  INR 1.  White blood cell count  8.6, hemoglobin 13.6, platelets 345, 81% neutrophil count.  Sodium 133,  potassium 2.9, chloride 92, bicarb 29, BUN 7, creatinine 0.72, glucose  84, total bilirubin 1.5, ALP 82, AST 20, ALT 13, total protein 5.8,  albumin 2.6, calcium 8.3.   A CT of the pelvis and abdomen with contrast showed acute on chronic  pancreatitis with significant inflammatory changes, no discrete  pseudocyst or pancreatic necrosis, tiny bilateral pleural effusions,  small amount of free pelvic fluid.   Chest x-ray showed low chest volume, small left pleural effusion.  Repeat PA and lateral for full inspiration when patient condition will  tolerate.   ASSESSMENT:  Patient is a 65 year old African-American female with the  history of alcohol abuse, diabetes, coronary artery disease,  hypertension, who has likely acute on chronic pancreatitis.   1. Pancreatitis.  We will make patient n.p.o. and hydrate her with a      bolus of 500 mL per hour and then 250 mL per hours normal saline.      We will re-evaluate to see if she needs more fluid after this, more      boluses.  We will check alcohol blood level and recheck CMET and      lipase in the morning.  We will start Protonix 40 mg IV b.i.d. and      morphine p.r.n. pain.  2. Hypokalemia.  We will replete this with four runs of KCL and check      again in the morning.  3. Hypertension.  Continue metoprolol if currently well-controlled.  4. Coronary artery disease.  Continue Plavix and aspirin.  5. Diabetes.  We will hold metformin and give sliding scale sensitive      insulin.  6. Prophylaxis:  Lovenox to prevent DVT.  7. Effusion on chest x-ray.  We will check a chest  x-ray, AP and      lateral, in the morning.  8. Disposition:  When abdominal pain resolves, we will try to get her      to tolerate a clear liquid diet and, if so, she will be able  to      leave.      Alanda Amass, M.D.  Electronically Signed      Pearlean Brownie, M.D.  Electronically Signed    JH/MEDQ  D:  11/23/2007  T:  11/23/2007  Job:  [id number]

## 2011-03-08 NOTE — Discharge Summary (Signed)
NAME:  [name] Foley, [name] Foley              ACCOUNT NO.:  [id number]   MEDICAL RECORD NO.:  [id number]          PATIENT TYPE:  INP   LOCATION:  5126                         FACILITY:  [facility]   PHYSICIAN:  Nestor Ramp, MD        DATE OF BIRTH:  May 06, 1946   DATE OF ADMISSION:  11/22/2007  DATE OF DISCHARGE:  11/26/2007                               DISCHARGE SUMMARY   PRIMARY CARE PHYSICIAN:  Johney Maine, M.D. at [facility]   DISCHARGE DIAGNOSES:  1. Acute pancreatitis on a chronic pancreatitis.  2. Coronary artery disease, status post stent.  3. Hypertension.  4. Diabetes.  5. Hyperlipidemia.  6. Insomnia.  7. Anxiety/depression.   DISCHARGE MEDICATIONS:  1. Aspirin 81 mg p.o. once daily.  2. Metoprolol 50 mg p.o. b.i.d.  3. Protonix 40 mg p.o. b.i.d.  4. Percocet 5/325 p.o. q.4 h. as needed for pain.   CONSULTATIONS:  None.   STUDIES/PROCEDURES:  1. The patient had a pelvic and abdominal CT with contrast that showed      acute on chronic pancreatitis with significant inflammatory      changes, no discrete pseudocyst or pancreatic necrosis, tiny      bilateral pleural effusions, small amount of free pelvic fluid.  2. A chest x-ray showed low chest volume, small left pleural effusion,      repeat PA and lateral with full expression when the patient can      tolerate.   LABORATORY:  Upon admission, lipase was 933.  White blood cell count was  8.6, hemoglobin 13.6, platelets 345.  Alcohol was less than 5.  T-bili  1.5.  Alk phos 82, AST 20, ALT of 13.  Sodium 133, potassium 2.9.  Total  protein 5.8, albumin 2.6, calcium 8.3.  On day of discharge, potassium  was 3.7, alk phos 201, lipase of 41.   HOSPITAL COURSE:  This is a 65 year old female with a history of alcohol  abuse that was admitted for acute pancreatitis with a history of chronic  pancreatitis.  The patient was admitted on November 22, 2007, with  abdominal pain.  She denied any nausea or vomiting at  the time and did  not have any nausea or vomiting during her hospitalization.  She  underwent a CT of the pelvis and abdomen that showed acute on chronic  pancreatitis.  She was treated appropriately with aggressive hydration  with normal saline and pain control with morphine.  She was placed on  Protonix prophylaxis.  The patient's lipase improved from 933 on the day  of admission to 41 on day of discharge and the pain was resolved, 6/10  pain on day of discharge.  The patient was also hypokalemic on admission with a potassium of 2.9.  On the day of discharge potassium was 3.7, after potassium chloride  supplementation.  The patient was hypertensive throughout her hospital stay.  Systolic  blood pressures ranged from 160-170.  We did increase her metoprolol to  50 mg p.o. b.i.d.  Please note the patient could benefit from a second  agent for her  hypertension.  For her diabetes, her Metformin was held during the hospitalization.  She was given sliding scale insulin.  No issues.  The patient also complained of some insomnia but said that she had been  on trazodone in the past.  The patient was not restarted on any  depression or sleep aids during her hospitalization.  I told her that  her primary care physician, Dr. Karn Pickler, would be better for her to start  these medications so that she could have close followup.  She has been  on Remeron in the past and says that has helped her with her sleep and  her depression.   The patient's condition is stable.   FOLLOWUP APPOINTMENT:  Dr. Karn Pickler on December 04, 2007 at 10 a.m.   She can return to work as soon as possible.   She is to be on a low sodium, heart healthy diet.   No restrictions to her activity.   Please call primary doctor or go to the clinic if abdominal pain returns  or not improved with the pain meds, or nausea and vomiting not  resolving.  Most importantly do not drink any alcohol.      Marisue Ivan, MD   Electronically Signed      Nestor Ramp, MD  Electronically Signed    KL/MEDQ  D:  11/26/2007  T:  11/26/2007  Job:  [id number]   cc:   Johney Maine, M.D.

## 2011-03-08 NOTE — H&P (Signed)
NAME:  Foley, [name]              ACCOUNT NO.:  [id number]   MEDICAL RECORD NO.:  [id number]          PATIENT TYPE:  INP   LOCATION:  3739                         FACILITY:  [facility]   PHYSICIAN:  Nanetta Batty, M.D.   DATE OF BIRTH:  08/15/1946   DATE OF ADMISSION:  03/04/2007  DATE OF DISCHARGE:                              HISTORY & PHYSICAL   CHIEF COMPLAINT:  Chest pain.   HISTORY OF PRESENT ILLNESS:  Ms. [name] Foley is a 65 year old female who has  been seen by Dr. Allyson Sabal in the past.  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 Orson Slick.  We saw her again in 2007.  She had another catheterization revealing high grade proximal LAD  disease and underwent DES stenting.  She has a documented occlusion of  her right external iliac artery.  We last saw her in the office in March  2007.  Since then, she has been admitted for seizures which have been  felt to be secondary to alcohol withdrawal.  She was recently admitted  in April by the house staff for syncope.  It was felt this was secondary  to orthostasis and dehydration.  She did have an echocardiogram as an  outpatient a week ago, the results of which are unknown.   She presents to the emergency room today with complaints of midsternal  chest discomfort.  Dr. Allyson Sabal thinks that her echo may have shown large  thoracic aorta but as noted, we do not have the actual results.  EKG  without any changes.  She presents now for further evaluation.   PAST MEDICAL HISTORY:  Remarkable for dyslipidemia, hypertension, non-  insulin-dependant diabetes mellitus, coronary disease and peripheral  vascular disease as described above.  She had a previous hysterectomy in  1994.  She had gallbladder surgery in 1976.  She has had alcohol  withdrawal in January 2008.   CURRENT  MEDICATIONS:  Her current medications are unknown.  She takes  Plavix and simvastatin and diabetic medications and antihypertensive  medications, but she does not know the names or the doses.   ALLERGIES:  She is allergic to VOLTAREN.   SOCIAL HISTORY:  She is single.  She has no children.  She smokes half  pack a day.  She says she is not drinking.   FAMILY HISTORY:  Family history is remarkable for coronary disease and  end-stage renal disease in one of her brothers.   REVIEW OF SYSTEMS:  Essentially unremarkable except for noted above.   PHYSICAL EXAMINATION:  VITAL SIGNS:  Blood pressure 110/77, pulse 68,  temperature 97.7, respirations 12.  GENERAL:  She is a well-developed, well-nourished African American  female in no acute distress.  HEENT: Normocephalic, atraumatic.  Sclerae nonicteric.  She has poor  dentition.  NECK:  Without JVD or bruit.  RESPIRATORY:  Chest clear to auscultation and percussion.  CARDIAC:  Reveals regular rate and rhythm without obvious murmur, rub or  gallop.  ABDOMEN: Nontender.  No hepatosplenomegaly.  EXTREMITIES:  Reveal diminished pulses in the right lower extremity.  NEUROLOGIC:  Exam is grossly intact.  She is awake, alert and oriented,  cooperative.  Moves all extremities without obvious deficit.  SKIN:  Warm and dry.   IMPRESSION:  1. Chest pain, rule out progression of coronary disease, rule out      aortic dissection  2. History of treated hypertension.  3. History of treated non-insulin-dependant diabetes mellitus.  4. Known coronary disease with LAD Taxus stenting, February 2007.  5. Peripheral vascular disease with previous catheterization in 2003      revealing normal coronaries but complicated by right iliac      thrombosis requiring thrombectomy and known occlusion of her right      external iliac artery.  6. History of alcohol abuse with the DTs in the past.  7. History of smoking.   PLAN:  The patient was seen by Dr. Allyson Sabal and  myself today in the  emergency room.  She will be admitted for further evaluation.  We will  to ahead and get a CT of her chest and start her on heparin and nitrates  pending this.  If her CT is negative, she will need a diagnostic  catheterization.      Abelino Derrick, P.A.      Nanetta Batty, M.D.  Electronically Signed    LKK/MEDQ  D:  03/04/2007  T:  03/05/2007  Job:  [id number]

## 2011-03-11 NOTE — Discharge Summary (Signed)
NAME:  [name] Foley, [name] Foley              ACCOUNT NO.:  [id number]   MEDICAL RECORD NO.:  [id number]          PATIENT TYPE:  INP   LOCATION:  5126                         FACILITY:  [facility]   PHYSICIAN:  Nestor Ramp, MD        DATE OF BIRTH:  [birth year]   DATE OF ADMISSION:  11/22/2007  DATE OF DISCHARGE:  11/26/2007                               DISCHARGE SUMMARY   PRIMARY CARE PHYSICIAN:  Johney Maine, M.D., [facility].   FINAL DIAGNOSES:  1. Acute on chronic pancreatitis.  2. Hypertension.  3. Depression.  4. Diabetes type 2.  5. History of smoking.  6. History of alcohol abuse.  7. Dyslipidemia.  8. History of substance abuse.  9. Insomnia.   DISCHARGE MEDICATIONS:  1. Aspirin 81 mg p.o. daily.  2. Metoprolol 50 mg p.o. b.i.d.  3. Protonix 40 mg p.o. b.i.d.  4. Percocet 5/325 1 tablet p.o. q.4 hours p.r.n. pain.   CONSULTATIONS:  None.   PROCEDURES:  1. The patient had abdominal CT and pelvic CT with contrast, revealing      acute on chronic pancreatitis and significant inflammatory changes.      No discrete pseudocyst or pancreatic necrosis. She had tiny      bilateral pleural effusion, small amount of free pelvic fluid.  2. She also had chest x-ray that showed low chest volume, small left      pleural effusion.   LABORATORY DATA:  On day of admission the patient had sodium level of  133, potassium 2.9, creatinine 0.72, glucose 84. White blood cell count  8.6. Hemoglobin 13.6. Platelets 345,000. INR 1, lipase 933. Total  protein of 5.8, albumin 2.6, calcium 8.3. Total bilirubin 1.5, alkaline  phosphatase of 82, AST 20, ALT 13. On day of discharge, sodium of 136,  potassium 3.7, creatinine 0.57, and glucose of 93. Lipase had trended  down to 41. The patient did have alkaline phosphatase of 201.   HOSPITAL COURSE:  This is a 65 year old African-American female with  significant history of alcohol abuse that was admitted for abdominal  pain.   PROBLEM  LIST:  1. ABDOMINAL PAIN:  The patient was admitted. Stated that the      abdominal pain began on a Sunday and has worsened daily. She denied      any nausea and vomiting with this. During her hospitalization, she      underwent initial CT of the pelvis and abdomen with contrast that      showed acute on chronic pancreatitis with no inflammatory changes.      She had no discrete pseudocyst or pancreatic necrosis. Therefore,      she was placed on NPO and was aggressively hydrated with normal      saline at 250 per hour. She had alcohol level that was checked,      which was normal. She was started on Protonix IV 40 mg bid. She was      pain controlled with morphine. Her abdominal pain gradually      improved. She never experienced excessive vomiting or nausea.  She      was slowly transitioned to a liquid diet and then to a regular      diet. On the day of discharge, she was tolerating p.o. and her pain      was under control with oral mediations. She was discharged on PPI      and also on Percocet for pain. She was in stable condition.  2. HYPERTENSION:  The patient did have some elevated blood pressures      during her hospitalization. She was continued on her home dose of      Metoprolol 25 mg b.i.d. On day of discharge, because she continued      to have elevated blood pressures, she was increased to 50 mg p.o.      b.i.d.  3. HYPOKALEMIA:  The patient initially came in with potassium level of      2.9. She was repleted via potassium chloride supplementation and on      the day of discharge, her potassium was 3.7. She was corrected      without any complications. Her other chronic issues were also      stable throughout her hospital stay. She did complain of some      insomnia. Was not given any Ambien. She was not given any type of      sedative medications. I told her that she could followup with her      primary care physician, Dr. Karn Pickler for this medication.   DISPOSITION:  She  is going to be discharged to home.   DISCHARGE INSTRUCTIONS:  1. The patient was instructed that she should not drink any alcohol.  2. She is going to have followup with Dr. Karn Pickler at [facility] on December 04, 2007 at 10:00 a.m.  3. She is to be on a low-sodium, heart healthy diet.  4. No restrictions to her activity.  5. There are no labs to followup on at this point.  6. Would recommend followup with the insomnia and depression.   CONDITION ON DISCHARGE:  Stable.      Marisue Ivan, MD  Electronically Signed      Nestor Ramp, MD  Electronically Signed    KL/MEDQ  D:  12/05/2007  T:  12/06/2007  Job:  [id number]   cc:   Johney Maine, M.D.

## 2011-03-11 NOTE — Discharge Summary (Signed)
NAME:  [name] Foley, [name] Foley              ACCOUNT NO.:  [id number]   MEDICAL RECORD NO.:  [id number]          PATIENT TYPE:  OBV   LOCATION:  4735                         FACILITY:  [facility]   PHYSICIAN:  Melina Fiddler, MD DATE OF BIRTH:  [birth year]   DATE OF ADMISSION:  02/09/2007  DATE OF DISCHARGE:  02/12/2007                               DISCHARGE SUMMARY   DISCHARGE DIAGNOSES:  1. Syncope likely secondary to orthostatic hypotension and      dehydration.  2. Prolonged QT, resolved.  3. Coronary artery disease with stent.  4. Diabetes.  5. Heme-positive stools.  6. Insomnia.  7. Hyperlipidemia.  8. Left lower extremity wound.  9. Tobacco dependence.   DISCHARGE MEDICATIONS:  1. Aspirin 325 mg p.o. daily as previously prescribed.  2. Famotidine 20 mg p.o. twice daily as previously prescribed.  3. Metformin 150 mg p.o. twice daily with food or only one if having      diarrhea as previously prescribed.  4. Nitroglycerin 0.4 mg sublingual under the tongue as needed as      previously prescribed.  5. Plavix 75 mg p.o. daily as previously prescribed.  6. Simvastatin 10 mg p.o. every evening as previously prescribed.  7. Ambien 10 mg p.o. q.h.s. as needed for sleep (instead of      trazodone).   The patient is to stop taking benazepril, metoprolol and trazodone until  further followup with primary MD, Dr. [name].   CONSULTATIONS:  None.   PROCEDURES:  1. Chest x-ray on February 09, 2007 showed no acute cardiopulmonary      disease.  2. Initial EKG on admission showed mildly prolonged QT; however, this      resolved on further repeat followup EKGs.   PERTINENT LABORATORY DATA:  On admission the patient had a CBC with a  white count of 9.2, hemoglobin 13.7, hematocrit of 42.9, and platelets  of 480.  The patient was likely hemoconcentrated at this point as she  was found to be orthostatic and had had decreased p.o. intake prior to  admission.  The patient's electrolytes on  admission were fairly normal.  The patient's hemoglobin did drop to the 10's after IV fluid hydration  being merely 2 L net positive.  The patient was found to have heme-  positive stools but no active bleeding or melena.  CBC at the time of  discharge revealed a white blood cell count of 4.9, hemoglobin 10.4,  hematocrit 31.9, and platelets 374.  MCV was low at 71.9.  BMP at the  time of discharge shows sodium 141, potassium 4.3, chloride 114, bicarb  20, BUN 6, creatinine 0.66, glucose 103 and calcium 8.6.  TSH was normal  at 1.497.  Cardiac enzymes were negative x4.  Phosphorus was normal at  2.9.  Magnesium was normal at 1.9.  Hemoglobin A1c was 6.6%.  Coagulation studies were within normal limits with an INR of 1.0.  Urinalysis and micro on admission were negative for signs of infection.  The patient was as noted above to be fecal occult blood positive on  admission.   BRIEF HOSPITAL COURSE:  Please see full dictated history and physical  for full details of initial presentation and workup.  In brief, this is  a [name] Foley who was admitted with a syncopal  episode.   #1 - SYNCOPE.  Was felt likely secondary to orthostatic hypotension and  dehydration on home blood pressure medications and with decreased p.o.  intake leading up to hospitalization.  The patient's antihypertensives  were held on admission and the patient was found to be orthostatic.  The  patient's blood pressure has remained stable to low throughout admission  though the patient was asymptomatic at the time of discharge without any  antihypertensives.  The patient's blood pressures in the 24 hours prior  to discharge ranged from 86-127 systolic over 54-84 diastolic.  The  patient was advised to continue holding her antihypertensive of the time  of discharge and consider resumption in the outpatient setting as  tolerated with the guidance of her primary care physician.  A 2-D  echocardiogram  was also ordered for the patient prior to discharge.  However, this was unable to be obtained until late in the day on the day  of discharge and the patient preferred to have this scheduled in the  outpatient setting.  She is scheduled to have this at [facility] on Friday, April 25 at 2 p.m..  These results were  requested to be faxed to Dr. [name], the patient's primary care  physician.   #2 - PROLONGED QT.  The patient had evidence of prolonged QT on  admission EKG.  However, this was normalized on repeat followup EKGs.  For this reason, however, the patient's trazodone was held and replaced  with Ambien as needed for insomnia.   #3 - CORONARY ARTERY DISEASE STATUS POST STENT.  The patient was  scheduled for outpatient repeat echocardiogram.  The patient's  cardiologist is Dr. Allyson Sabal at [facility].  The  patient was continued on aspirin and Plavix given her stent as her  hemoglobin remained stable and she was not felt to have a rapid GI  bleed.  Would consider resumption of ACE inhibitor and beta blocker in  the outpatient setting if the patient's blood pressures will tolerate.   #4 - TYPE 2 DIABETES.  The patient's metformin was held while in the  hospital.  However, she had minimal sliding-scale insulin requirements  and her hemoglobin A1c of 6.6% indicates excellent control in the  outpatient setting on a full oral hypoglycemic of metformin.   #5 - HEME-POSITIVE STOOLS.  The patient's hemoglobin dropped from around  13 to 10 after aggressive IV fluid hydration and the patient was 2 L net  positive.  It was likely mostly dilutional.  The patient's hemoglobin  remained stable in the 10's thereafter.  However, given the setting of  anemia and heme-positive stools, an outpatient GI workup is appropriate  and should be arranged by the patient's primary physician.  #6 - INSOMNIA.  The patient was previously on trazodone at home.   However, this was held given her prolonged QT.  The patient was  discharged on Ambien to take instead for the short-term until further  followup with her primary care physician.   #7 - HYPERLIPIDEMIA.  The patient was continued her home dose of statin.   #8 - LEFT LOWER EXTREMITY WOUND.  Chronic and stable.  The patient is  seen in the Wound Clinic for frequent wound care and is to continue this  in the outpatient setting.  Her next visit is on Thursday April 24.   #9 - TOBACCO DEPENDENCE.  The patient was placed on nicotine patch while  in the hospital.   DISCHARGE INSTRUCTIONS:  The patient is to follow a low sodium heart  healthy diabetic diet.  The patient is to perform wound care as  previously instructed by the wound care clinic.  The patient is to  increase her activity slowly and avoid straining.  Stop any activity  that causes chest pain, shortness of breath, dizziness, sweating or  excessive weakness.   FOLLOWUP APPOINTMENTS:  1. The patient has a followup appointment with the Wound Clinic on      February 15, 2007, as previously scheduled.  2. The patient has followup with primary MD, Dr. Lynnea Ferrier, [facility], on Feb 22, 2007, at 2 p.m.  3. The patient has a 2-D echocardiogram scheduled at [facility]ascular Center for February 16, 2007, at 2 p.m.   The patient was advised to call Dr. Caren Macadam office for any symptoms of  passing out, dizziness or bleeding, or blood in her stool.   The patient was discharged home in stable medical condition.     ______________________________  Drue Dun, M.D.    ______________________________  Melina Fiddler, MD    EE/MEDQ  D:  02/13/2007  T:  02/13/2007  Job:  [id number]   cc:   Broadus John T. Pamalee Leyden, MD  Nanetta Batty, M.D.

## 2011-03-11 NOTE — Discharge Summary (Signed)
NAME:  [name], [name]              ACCOUNT NO.:  [id number]   MEDICAL RECORD NO.:  [id number]          PATIENT TYPE:  INP   LOCATION:  2907                         FACILITY:  [facility]   PHYSICIAN:  Nanetta Batty, M.D.   DATE OF BIRTH:  04-11-1946   DATE OF ADMISSION:  01/15/2008  DATE OF DISCHARGE:  01/18/2008                               DISCHARGE SUMMARY   Mr. [name] Foley is a 65 year old African American female patient with  known history of coronary artery disease and peripheral vascular  disease.  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  In February 2007, she was admitted  for chest pain.  She had stent to proximal LAD for high-grade disease.  She was admitted again in April 2008 with chest pain.  Her cardiac cath  revealing patent stents and no significant disease.  Recent admission on  November 23, 2007, for abdominal pain with acute on chronic pancreatitis,  resolved after IV fluids and bowel rest.  She has came into the hospital  with complaints of abdominal pain beginning yesterday.  She had 2 drinks  earlier on the day she was admitted.  She then later had substernal  chest pain radiating into her left arm with associated shortness of  breath.  Her lipase was 184.  She was admitted to rule out an MI and GI  consult was called.  Her enzymes did come back at 0.15, and it was  thought she may need a heart catheterization.  She still had chest pain.  She was given heparin, Integrilin, and IV Lopressor.  On the morning of  January 16, 2008, she was seen by Dr. Alanda Amass.  Her full cardiac markers  were negative.  She only had a positive troponin.  She did have  abdominal tenderness, and a GI consult was called.  Her heparin and  Integrilin were discontinued.  She was seen by GI, specifically Dr.  Evette Cristal.  She was kept NPO and hydrated.  A CT scan was  done with pancreas  protocol, which showed near complete resolution of pancreatic  inflammatory, edematous changes with no evidence of pancreatic necrosis  or pseudocyst.  Abdominal ultrasound showed previous cholecystectomy of  normal-appearing pancreas with calcification and ductal dilatation  consistent with changes of chronic pancreatitis.  The pancreatic duct  was dilated and the extrahepatic biliary ductal system was dilated.  It  was not seen that she had pancreatic head swelling.  Over the following  days, she was feeling better.  She was started on pancreatic enzymes  with her food.  They gave a trial of low-fat diet on January 17, 2008, and  alcohol cessation was again recommended to where she was seen by.  She  has psychosocial assessment by the clinical social worker, who gave her  information on alcohol cessation, alcohol abuse, and places she could  attend for help.  On January 18, 2008, GI also recommended to her that  alcohol cessation was an absolute must, and she was considered  stable  for discharge home.   LABORATORY DATA:  Hemoglobin 12.1, hematocrit 37.4, platelets of 410,  and WBC 5.6.  Ionized calcium was 1.12, sodium was 139, initial  potassium was 3.0 on discharge was 4.2, chloride 105, CO2 of 27, glucose  121, BUN 8, creatinine 0.84, and total protein was 6.1.  Albumin 3.0,  AST 27, ALT 29, and her ALP 127.  Her amylase was 136.  Her lipase  initially was 184 and on the day of discharge, it was 56.  CK-MB:  1. 92/3.1 and troponin of 0.15.  2. 130/8.7 and troponin of 1.53.  3. 118/6.3 and troponin of 1.60.  4. 116/4.5 and troponin of 1.41.  5. 74/1.5 and troponin of 0.83.   Total cholesterol was 89, triglycerides 59, HDL 39, and LDL of 38.  CA  19-9 was low at less than 1.2.   DISCHARGE MEDICATIONS:  1. Metoprolol 50 mg twice per day.  2. Prilosec 20 mg a day or Pepcid 20 mg a day.  She can buy over-the-      counter.  3. Plavix 75 mg a day.  4. Simvastatin  40 mg at bedtime.  5. Aspirin 81 mg two per day.  6. Pancrease 10 mg capsules two with each meal.  7. Metformin 500 mg once per day.  8. Amlodipine 5 mg per day.   [facility].  She was given  information on this.  She should return to see Dr. Allyson Sabal, our office  will call her.  She should see her regular MD and she should see Dr.  Evette Cristal.  Their phone numbers were given to her.  She should quit drinking  alcohol completely.   DISCHARGE DIAGNOSES:  1. Acute on chronic pancreatitis.  2. Acute coronary syndrome.  3. Known coronary artery disease.  4. Hyperlipidemia.  5. Diabetes mellitus type 2.  6. Hypertension.  7. Dyslipidemia.  8. Alcohol abuse.  9. Type 2 diabetes.  10.Depression, anxiety, and insomnia.  11.History of hysterectomy in 1994.  12.History of cholecystectomy in 1976.  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       Lezlie Octave, N.P.      Nanetta Batty, M.D.  Electronically Signed    BB/MEDQ  D:  03/05/2008  T:  03/06/2008  Job:  [id number]   cc:   Nanetta Batty, M.D.  Graylin Shiver, M.D.  Johney Maine, M.D.

## 2011-03-11 NOTE — Discharge Summary (Signed)
NAME:  [name] Foley, [name] Foley              ACCOUNT NO.:  [id number]   MEDICAL RECORD NO.:  [id number]          PATIENT TYPE:  OBV   LOCATION:  6527                         FACILITY:  [facility]   PHYSICIAN:  Pearlean Brownie, M.D.DATE OF BIRTH:  1946-05-13   DATE OF ADMISSION:  11/24/2005  DATE OF DISCHARGE:  11/26/2005                                 DISCHARGE SUMMARY   CONSULTING PHYSICIAN:  Vonna Kotyk R. Jacinto Halim, M.D., of [facility] and  Vascular.   PRIMARY CARE PHYSICIAN:  Unassigned.   DISCHARGE DIAGNOSES:  1.  Coronary artery disease with 85% stenosis of the proximal left anterior      descending artery, status post stenting by [facility] and      Vascular.  2.  Hypertension.  3.  Diabetes mellitus type 2.  4.  Obesity.  5.  History of tobacco abuse.  6.  Anxiety.   PROCEDURES:  The patient underwent cardiac catheterization on November 26, 2005, and at that time had a stent placed.  Please see the  dictated  catheterization note for details.  The patient had a chest x-ray November 24, 2005, which showed mild vascular congestion.  She also had a catheterization  performed November 26, 2005, which showed 85% stenosis of the proximal LAD.   LABORATORY DATA:  Admission labs were significant for a glucose of 192 and a  creatinine of 0.8.  The patient had negative point of care markers in the  emergency room.  However, overnight the patient began having positive  troponins.  Initial CK was 116, CK-MB was 2.1, troponin was 0.23.  The  second set showed a CK of 124, CK-MB of 3.6, troponin at 0.31.  Follow-up  cardiac markers show a CK of 252, MB of 6.2, troponin of 0.55, and the last  set showed a CK of 224, MB of 5.1 and a troponin of 0.56.   Labs at discharge showed sodium 137, potassium 3.1, chloride 106, bicarb 26,  BUN of 6, creatinine of 1.1, glucose of 198.  Hemoglobin 12.3, hematocrit of  39, platelet count of approximately 450.  Hemoglobin A1c in the hospital was   8.3.   The patient had a fasting lipid panel that showed an HDL of 39 and LDL of  55, triglyceride of 75.   HISTORY AND PHYSICAL:  Please see the dictated H&P on the chart for details.  The patient is a 65 year old obese African-American female with a history of  smoking, hypertension and newly-diagnosed diabetes, who came in with  atypical chest pain.  Initially EKG showed no EKG changes; however, the  patient had positive cardiac markers.   HOSPITAL COURSE:  Problem 1.  CHEST PAIN:  The patient was pain-free in the hospital; however,  as stated earlier and as dictated in the lab section, the patient had  positive cardiac markers that were trending upwards.  As a result the  patient, because of her risk factors and her potassium troponins, cardiology  was consulted.  The patient underwent catheterization on November 26, 2005.  At that time she was found to have an 85% stenosis of  the proximal LAD.  A  stent was placed.  The patient tolerated the procedure well and is doing  fine postprocedure day #1 with no complications.   Secondary to her coronary artery disease, the patient is being discharged  home on a beta blocker and aspirin and Plavix, which are likely to continue  for at least 12 months, but we will leave that for the determination of  cardiology, and a low-dose ACE inhibitor.  The patient is also being started  on Zocor per the recommendation of cardiology for its possible pleiotropic  effects even if her fasting lipid panel is relatively good.   Problem 2.  DIABETES MELLITUS TYPE 2:  The patient had random blood sugars  in the hospital that were elevated.  A hemoglobin A1c in the hospital is  8.3.  The patient therefore is diagnosed with diabetes type 2.  We will  start the patient on metformin 500 mg p.o. daily and have her follow up in  my clinic in two to three weeks and up-titrate her metformin to try to  obtain a hemoglobin A1c of goal 6.5.  We will also refer her  from my clinic  to a diabetes nutrition and education center.   DISCHARGE MEDICATIONS:  1.  Aspirin 325 mg p.o. daily.  2.  Plavix 75 mg p.o. daily.  3.  Lopressor 25 mg p.o. b.i.d.  4.  Zocor 10 mg p.o. daily.  5.  Metformin 500 mg p.o. daily.  6.  Remeron 30 mg p.o. q.h.s.  7.  Klonopin 1 mg p.o. q.h.s.  8.  Motrin 600 mg p.o. q.6h. p.r.n. pain.  9.  Percocet, five tablets were given for severe pain if the patient had      lingering pain at the catheterization site.   DISCHARGE INSTRUCTIONS:  The patient is instructed to call the [facility] at [phone] to schedule an appointment with Dr.  [name] in two to three weeks.  At that time we will reassess her blood  pressure and follow up a BMP, since the patient is going to be on a low-dose  ACE inhibitor.  Dr. Verl Dicker office will call the patient to arrange follow-  up.   ADDENDUM:  In retrospect, given the fact that the patient's creatinine had  bumped from its initial value to 1.1 with the contrast dye and the patient  has yet to prove that she will be compliant with follow-up at the family  practice center, the patient will not be discharged home on lisinopril.  Instead, when the patient comes to Dr. Caren Macadam clinic in two to three  weeks, she will be started on lisinopril and therefore we can check a BMP  within a week to make sure that there is no kidney damage on starting that  medication.  I hesitate to send the patient out on that medicine without  reliable follow-up established.   Therefore, discharge medications will be:  1.  Aspirin 325 mg p.o. daily.  2.  Plavix 75 mg p.o. daily.  3.  Lopressor 25 mg p.o. b.i.d.  4.  Zocor 10 mg p.o. daily.  5.  Metformin 500 mg p.o. daily.  6.  Remeron 30 mg p.o. q.h.s.  7.  Klonopin 1 mg p.o. daily.  8.  Motrin and Percocet as needed.      Broadus John T. Pamalee Leyden, MD    ______________________________  Pearlean Brownie, M.D.   WTP/MEDQ  D:   11/26/2005  T:  11/26/2005  Job:  [id number]   cc:   Cristy Hilts. Jacinto Halim, MD  Fax: [phone]

## 2011-03-11 NOTE — Discharge Summary (Signed)
NAME:  Foley, [name]                        ACCOUNT NO.:  [id number]   MEDICAL RECORD NO.:  [id number]                   PATIENT TYPE:  INP   LOCATION:  5511                                 FACILITY:  [facility]   PHYSICIAN:  Blanch Media, MD               DATE OF BIRTH:  26-Aug-1946   DATE OF ADMISSION:  08/12/2002  DATE OF DISCHARGE:  08/15/2002                                 DISCHARGE SUMMARY   DISCHARGE DIAGNOSES:  1. Chest pain of noncardiac origin.  2. Alcohol abuse.  3. Depression and anxiety.  4. Lower extremity edema.  5. Microcytosis.  6. Tobacco abuse.  7. Heme positive rectal examination.   DISCHARGE MEDICATIONS:  The patient was instructed to resume her medication  regimen that she was on prior to admission.  She did not recall all of the  medicines she was taking.  She said that Dr. Tiburcio Pea has prescribed for her a  fluid pill presumed to be Lasix as well as a potassium pill.  She had been  taking the potassium and Lasix on an alternating day schedule per her  report.  We tried to contact Dr. Tiburcio Pea' office and was not successful at  calling the number [phone].  Medications she was taking which were  prescribed by Dr. Mila Homer for her depression included Neurontin 800 mg t.i.d.  and 1-1/2 pills q.h.s., Ativan 1 mg p.o. q.h.s., Klonopin 1 mg p.o. q.h.s.,  Trazodone 150 mg one to two tablets p.o. q.h.s., Remeron 30 mg one p.o.  q.h.s., Wellbutrin 200 mg b.i.d.   DISPOSITION:  At the time of discharge, the patient is doing quite well.  She has had no chest pain since the night of admission.  She had cardiac  catheterization done today which showed no coronary artery disease.  She has  an already-scheduled examination to meet with Dr. Mila Homer and she is to keep  that appointment.  I have called and arranged for her to have a follow-up  appointment with Dr. Jacinto Halim on November 5, at 9:45 a.m.  The patient is to  call and arrange for a follow-up examination with Dr. Tiburcio Pea  at her  convenience.   PROCEDURE:  1. Cardiac catheterization was performed on August 15, 2002, by Dr. Jacinto Halim.     Results were normal coronary arteries with a codominant system.  2. Two-dimensional echocardiogram performed August 13, 2002, results showed     an overall left ventricular systolic function was normal with a left     ventricular ejection fraction estimated between 55 and 65%.  Left     ventricular wall thickness was mildly to moderately increased.  There was     mild aortic root dilation and mildly dilated left atrium.   CONSULTATIONS:  The patient was seen in consultation by Dr. Jacinto Halim at  [facility].   HISTORY OF PRESENT ILLNESS:  The patient is a 65 year old African-American  female with a  past medical history significant for depression, GERD, and  insomnia.  She presents with the complaint of approximately six weeks of  episodic chest pain.  This pain is described as a tightness in her chest  which radiates to her left arm.  She often experiences shortness of breath  with these episodes.  Episodes last on the order of several seconds to  minutes.  They are often related to exertion, but not always.  As she has  had several family members with heart trouble recently including a brother  which is hospitalized currently.  Given this and her recent symptoms, she is  concerned that she may be having heart trouble herself.  She has never had a  heart attack in the past, she has never had a cardiac catheterization or  echocardiogram.  She does report that in the last six months, she has had  some lower extremity edema for which her primary doctor has treated her with  a diuretic which she cannot remember the name of and potassium pills.  She  has occasionally experienced nausea with her chest pain, but has not  vomited, and occasionally also experiences diaphoresis.   PHYSICAL EXAMINATION:  VITAL SIGNS:  Pulse 108, blood pressure 138/88,  temperature 98.4,  respiratory rate 20, and O2 sat is 95% on room air.  GENERAL:  This is an obese African-American female appearing her stated age  that is anxious, but with no acute distress.  HEENT:  PERRL, EOMI.  Oropharynx is pink with moist mucous membranes, poor  dentition, no erythema, and no exudates.  NECK:  Supple with no nodules palpated in the thyroid gland.  LUNGS:  Minimal crackles at the bilateral bases.  HEART:  The patient has a regular tachycardic rate in the low 100s by  examination. There are no murmurs, rubs, or gallops appreciated.  Peripheral  pulses are 2+ throughout.  ABDOMEN:  Obese and nontender with normal active bowel sounds. She does have  a cholecystectomy scar in her right upper quadrant.  EXTREMITIES:  There is trace pitting edema in her ankles bilaterally.  This  edema does not extend upward on her legs.  SKIN:  No rashes on her back, legs, or arms.  NEUROLOGY:  Cranial nerves II-XII grossly intact.  There are no focal  deficits appreciated.   LABORATORY DATA:  Sodium 137, potassium 4.1, chloride 106, bicarb 24, BUN  20, creatinine 1.0, glucose 87, white blood cell count 8.1, hemoglobin 14.5,  platelet count 442, MCV 69.3, RDW 18.7, bilirubin 0.6, alkaline phosphatase  142, AST 31, ALT 26, protein 7.5, albumin 3.6, calcium 8.5.  First set of  cardiac enzymes included CK 459, MB 4.0, relative index 0.9, and troponin I  0.01.  EKG done in the emergency department showed sinus tachycardia with  inverted T waves in lead V1. There was no ST segment depression or  elevation.  Chest x-ray obtained by the emergency department was normal with  no cardiomegaly, no infiltrate, and no effusion.  She was hemoccult positive  on rectal examination.  Alcohol level is 170.   HOSPITAL COURSE:  1. Chest pain.  The patient was admitted to rule out cardiac ischemia as the    cause of her chest pain.  While in the emergency department, she received     nitroglycerin sublingual x3 each five  minutes which did not relieve her     chest pain at that time.  She did have two episodes of nausea and  vomiting in the emergency department.  Given this and her historical     account of her chest pain, we were suspicious that she could be having     cardiac ischemia.  Cardiac enzymes were cycled x3.  All three times they     were negative for myocardial damage.  EKG was obtained the following     morning and showed no significant change compared to the EKG obtained in     the emergency department.  Again no signs of ischemia was noted and the T     wave inversion in V1 was still present.  On the second day following her     admission, she denied chest pain, but this time reporting that her pain     was relieved by the nitroglycerin that she received in the emergency     department.  It was assumed that some of her historical discrepancies in     describing her symptoms was due to inebriation at the time of     presentation.  On day #2 of her hospitalization, she did have a cardiac     two-dimensional echocardiogram and the results are as above.  Given the     patient's risk factors for having cardiac ischemia including her age,     smoking history, obesity, and surgical menopause, the medicine service     decided to consult cardiology for further evaluation.  She was seen by     [facility] and it was suggested that a cardiac     catheterization be performed.  The patient agreed to this and     catheterization was done on August 15, 2002, which showed no evidence of     coronary artery disease.  Given these findings, her chest pain could be     secondary to her morbid obesity, question of obstructive sleep apnea, or     diastolic dysfunction.  These possible causes for her discomfort can be     worked up and followed as an outpatient.  The results of the     catheterization were explained to the patient and she is very much     relieved that she does not have coronary  artery disease.  2. Alcoholism.  The patient did describe a history of alcohol abuse which     she had trouble with several years ago.  She did say that she has been     sober for approximately four years and that her drinking recently was a     fluke.  She did not really admit to drinking until we discussed with her     a positive alcohol level of 170 on day #2 of admission.  Her last drink     was stated to be two days prior to coming to the emergency department.     We discussed with her the possible treatment options for alcoholism     including Alcoholics Anonymous which she has already gone to in the past.     She is aware of her resources in the community and assures me that she     will continue to take advantage of these resources with her alcohol     problem.  3. History of depression and anxiety.  She sees Dr. Mila Homer on a regular basis    and is followed by her for control of her depression and anxiety.  She is     on multiple medications to control her symptoms, one  of which is     insomnia.  I called the office of Dr. Mila Homer and spoke with the nurse about     her medication list to clarify what she had been taking as an outpatient     as well as to ensure that she had follow-up appointment already     scheduled.  She is to continue her medications as prescribed and follow     up with Dr. Mila Homer as already scheduled.  4. Heme positive stool on rectal examination.  In the emergency room, rectal     examination performed while she was having chest pain because we were     considering starting Lovenox therapy.  Given her hemoccult status as     being positive, this therapy was not started.  It is suspected that her     positive hemoccult could be secondary to hemorrhoids, although they were     not visualized or palpated on examination or secondary to gastritis from     alcohol use.  Given her age, it is important she be scheduled for     colonoscopy or EGD for further evaluation to  rule out malignancy.  This     also can be followed up as an outpatient with her primary care physician.  5. Microcytosis.  Although she was not anemia according to CBC, the patient     had an MCV in the high 60s.  This is suspicious for thalassemia trait     given that she is African-American.  6. Tobacco abuse.  We discussed with the patient the significant risk     associated with tobacco abuse including lung cancer and exacerbation of     coronary artery disease.  She seemed to be interested in smoking     cessation resources.  We encouraged her to pursue this further as an     outpatient.   DISCHARGE LABORATORY DATA:  Sodium 136, potassium 4.1, chloride 107,  bicarbonate 23, BUN 13, creatinine 0.8, glucose 118.  MCV 69.3, RDW 18.7.  Cardiac enzymes negative x3.  PT 12.5, INR 0.9, PTT 30. Reticulocyte count  of 1.2.  Fasting lipid panel included cholesterol 140, triglycerides 137,  LDL 48, HDL 65.     Oneal Grout, MS4                           Blanch Media, MD    MD/MEDQ  D:  08/15/2002  T:  08/16/2002  Job:  [id number]   cc:   Juluis Mire, M.D.   Elenor Legato R. Jacinto Halim, M.D.

## 2011-03-11 NOTE — H&P (Signed)
NAME:  Foley, [name]              ACCOUNT NO.:  [id number]   MEDICAL RECORD NO.:  [id number]          PATIENT TYPE:  OBV   LOCATION:  6527                         FACILITY:  [facility]   PHYSICIAN:  Pearlean Brownie, M.D.DATE OF BIRTH:  04-14-1946   DATE OF ADMISSION:  11/24/2005  DATE OF DISCHARGE:                                HISTORY & PHYSICAL   CHIEF COMPLAINT:  Chest pain.   HISTORY OF PRESENT ILLNESS:  [name] Foley is a 65 year old African-American  female with a history of gastroesophageal reflux disease and depression who  presented to the ED with chest pain.  Patient states pain began three days  ago and was intermittent; however, it lasted greater than two hours on the  morning of admission.  The pain was described as substernal pressure that  radiated to her left shoulder and arm.  The pain was not relieved with  nitroglycerin and was not particularly associated with exertion.  The  patient had some associated mild shortness of breath, but no nausea,  vomiting, or diaphoresis and she also states that it was not like the chest  discomfort she feels when she has reflux.  She also admits that she has not  been taking her aspirin daily as she is supposed to.  The patient has a  history of an abnormal Cardiolite test in 2003, but a normal catheterization  that same year.   REVIEW OF SYSTEMS:  Positive for a chronic cough as well as chronic blurry  vision.  Negative for nausea, vomiting, hematuria, hematochezia.  Negative  for weakness or numbness in extremities.   PAST MEDICAL HISTORY:  1.  Depression.  2.  Anxiety.  3.  GERD.  4.  In 2003 abnormal Cardiolite, normal catheterization that was complicated      by a clot requiring iliofemoral thrombectomy.   PAST SURGICAL HISTORY:  1.  Cholecystectomy in 1976.  2.  Total abdominal hysterectomy.   MEDICATIONS:  1.  Enteric-coated aspirin 81 mg p.o. daily.  2.  Remeron 30 mg p.o. at bedtime.  3.  Klonopin 1 mg p.o. at  bedtime.  4.  Wellbutrin 150 mg p.o. b.i.d.  5.  Fluid pill.  6.  Reflux.   FAMILY HISTORY:  Patient has a brother who is alive, but who had an MI  necessitating a CABG in his 54s.  He also has type 2 diabetes.  No family  history of stroke.   SOCIAL HISTORY:  The patient lives alone and is on disability.  She does not  have any children.  She smokes a third of a pack of cigarettes a day and is  trying to quit.  No alcohol.  No drugs.   PHYSICAL EXAMINATION:  VITAL SIGNS:  Temperature 98, blood pressure 120/76,  pulse 89-105, respirations 20, O2 96% on room air.  GENERAL:  This is a morbidly obese female who is alert and oriented x3.  Appropriate mood and affect.  No acute distress.  HEENT:  Head is normocephalic, atraumatic.  Pupils are equal, round, and  reactive to light and accommodation.  Extraocular movements intact.  OP  clear without erythema or exudate.  Poor dentition.  Moist mucous membranes.  NECK:  Supple.  No lymphadenopathy.  No JVD.  CARDIOVASCULAR:  Distant heart sounds.  Regular rate and rhythm.  No  murmurs, rubs, or gallops auscultated.  LUNGS:  She has questionable faint crackles in her left base, otherwise  clear to auscultation bilaterally with good air movement.  ABDOMEN:  Obese.  Positive bowel sounds, soft, nontender, nondistended.  EXTREMITIES:  No clubbing, cyanosis, edema.  Peripheral pulses are 2+ and  equal.  NEUROLOGIC:  Cranial nerves II-XII are grossly intact.  Strength 5/5  throughout.  Normal sensation throughout.  Gait not observed.   LABORATORY DATA:  Sodium 139, potassium 4.1, chloride 109, bicarbonate 23,  BUN 11, creatinine 0.8, glucose 192.  Hemoglobin 15.3, hematocrit 45.  D-  dimer 0.3.  BNP less than 30.  Point of care enzymes:  Myoglobin 57.8, CK-MB  less than 1, troponin less than 0.05.   ASSESSMENT/PLAN:  This is a 65 year old female with atypical chest pain.  1.  Chest pain.  Though the chest pain is atypical in nature, she has       multiple risk factors for acute coronary syndrome including age,      smoking, positive family history, and morbid obesity.  We will get three      sets of cardiac enzymes and repeat an EKG in the a.m.  The patient has      had a cardiac work-up about four years ago and will likely need another      round of stress testing as an outpatient.  We will also further risk      stratify her with fasting lipid panel.  We will also be checking a chest      x-ray and a TSH.  We will get a smoking cessation consult and continue      her on aspirin 81 mg daily.  2.  Increased glucose.  We will check a fasting CBG in the a.m. and a      hemoglobin A1c.  3.  Gastroesophageal reflux disease.  We will continue proton-pump inhibitor      in her.  4.  Depression/anxiety.  Will continue her home regimen of Remeron,      Klonopin, and Wellbutrin.  5.  Electrolytes, stable.  We will recheck a BNP in the morning and no need      for intravenous fluids now and a heart healthy diet.   DISPOSITION:  The patient will need to have a primary care arranged for her  to follow up with as an outpatient.      Altamese Cabal, M.D.    ______________________________  Pearlean Brownie, M.D.    KS/MEDQ  D:  11/24/2005  T:  11/25/2005  Job:  [id number]

## 2011-03-11 NOTE — Discharge Summary (Signed)
NAME:  [name] Foley, [name] Foley              ACCOUNT NO.:  [id number]   MEDICAL RECORD NO.:  [id number]          PATIENT TYPE:  INP   LOCATION:  3023                         FACILITY:  [facility]   PHYSICIAN:  Alanda Amass, M.D.   DATE OF BIRTH:  03/03/1946   DATE OF ADMISSION:  10/25/2006  DATE OF DISCHARGE:  10/28/2006                               DISCHARGE SUMMARY   ATTENDING PHYSICIAN:  Dr. Leveda Anna.   PRIMARY CARE PHYSICIAN:  Dr. [name] at [facility].   DISCHARGE DIAGNOSES:  Include:  1. Alcoholism and benzodiazepine use.  2. Seizures,  possibly due to alcohol or benzodiazepine or Wellbutrin      use.  3. Tobacco use.  4. Obesity.  5. Hypertension.  6. Diabetes.  7. Hypokalemia.  8. Decreased mean corpuscular volume.  9. Hyperlipidemia.   CONSULTS:  None.   PROCEDURES:  Head CT on January 02 showed minimal diffuse cerebral and  cerebellar atrophy and minimal small vessel white matter ischemic  changes in both cerebral hemispheres.  No acute changes.   DISCHARGE MEDICATIONS:  Include:  1. Plavix 75 mg daily.  2. Zocor 10 mg daily.  3. Benazepril 10 mg daily.  4. Toprol-XL 50 mg daily.  5. Aspirin 81 mg daily.   Patient is to stop taking her combination benazepril and HCTZ medication  as well as her Wellbutrin and clonazepam.  Followup appointment with Dr.  [name] at [facility] on the 14th of January at  10:45.  Special followup needed.  Patient will need an EEG to look for  seizure activity approximately 2-3 weeks after her discharge.   DISCHARGE ACTIVITIES:  Stable.  Patient was without seizures or syncopal  episodes her entire hospitalization.   DISCHARGE INSTRUCTIONS TO PATIENT:  If she notices any more seizure-like  activity, she is to seek medical attention ASAP.  Also, no driving,  swimming, or operating heavy machinery for 3 months.   HOSPITAL COURSE:  Patient is a 65 year old African-American female with  a  history of alcoholism, tobacco abuse, obesity, hypertension, diabetes  who is here for new-onset seizures.   1. Seizures:  This was presumed to likely be due to alcohol withdrawal      or benzo withdrawal.  Also, another possibility would be an      increased seizure threshold because of the Wellbutrin she was      taking.  The patient's Wellbutrin and clonazepam were held.  The      patient was placed on an Ativan taper, and neuro checks were      routinely performed.  Patient continued to be at her baseline      mentation with a normal neurologic exam throughout her      hospitalization.  She was put on fall precautions.  EKGs and      telemetry showed no cardiac problems.  2. Alcoholism:  Patient was treated with a banana bag, and alcohol      level was less than 5.  3. Insomnia:  Patient was given Ambien p.r.n.  4. Hyperlipidemia:  Patient's fasting  lipid profile showed cholesterol      of 108, triglycerides 90, HDL 49, LDL of 45.  She was continued on      her Zocor.  5. Tobacco abuse:  The patient was put on a Nicoderm patch.  6. Decreased MCV.  Ferritin was within normal limits, and her      hemoglobin and hematocrit were also within normal limits,      hemoglobin 12.9, hematocrit 41.9.  We did not pursue this any      farther.  7. Hypertension:  Patient was continued on Toprol-XL and Lotensin      HCTZ.  Due to her hypokalemia, we ended up holding her HCTZ, so she      will only be sent home on the benazepril.  8. Hypokalemia:  Patient's potassium was repleted.  Potassium on      discharge was within normal limits at 4.2, and her HCTZ was held,      and she should not go home on this medication.   Labs include a urine drug screen.  It was negative.  Labs on discharge  show a creatinine of 0.71, potassium of 4.2.  Ferritin normal at 97.  Lipid panel:  Cholesterol 108, triglycerides 70, HDL of 49, LDL 45.  Alcohol level less than 5.  Magnesium level 1.8 and normal.  PT and  PTT  within normal limits.  LFTs within normal limits.  CBC on admission  showed white blood cells 5.9, hemoglobin 12.9, hematocrit 41.9, and  platelets 336.           ______________________________  Alanda Amass, M.D.     JH/MEDQ  D:  11/04/2006  T:  11/05/2006  Job:  [id number]   cc:   Dr. [name]

## 2011-03-11 NOTE — Assessment & Plan Note (Signed)
[facility]   NAME:  Foley, [name]              ACCOUNT NO.:  [id number]   MEDICAL RECORD NO.:  [id number]      DATE OF BIRTH:  [birth year]   PHYSICIAN:  Theresia Majors. Tanda Rockers, M.D. VISIT DATE:  02/08/2007                                   OFFICE VISIT   SUBJECTIVE:  [name] Foley returns for follow up with traumatic injury on  the anterior right thigh.  In the interim, she has complained of pain,  and in fact, called the clinic requesting pain medicine.  She was given  a prescription phoned in for Vicodin.  She has taken a total of 2  tablets with excellent relief.   OBJECTIVE:  Blood pressure is 104/78, respirations 20, pulse rate 93,  temperature is 98.5.  Inspection of the wound shows that the wound is  clean.  There is 1005 granulation with very scant drainage.  No  hyperemia.  No evidence of infection. There are no ischemic changes.   ASSESSMENT:  Improved wound.   PLAN:  We have instructed the patient to use over-the-counter antiseptic  soap wash followed by topical tri-antibiotic ointment.  We have provided  her with a fishnet to place a 4 x 4 gauze.  She will do the local care  once a day and we will reevaluate her in 1 week p.r.n.      Harold A. Tanda Rockers, M.D.  Electronically Signed     HAN/MEDQ  D:  02/08/2007  T:  02/08/2007  Job:  [id number]

## 2011-03-11 NOTE — Consult Note (Signed)
NAME:  [name] Foley, [name] Foley              ACCOUNT NO.:  [id number]   MEDICAL RECORD NO.:  [id number]          PATIENT TYPE:  REC   LOCATION:  FOOT                         FACILITY:  [facility]   PHYSICIAN:  Theresia Majors. Tanda Rockers, M.D.DATE OF BIRTH:  October 14, 1946   DATE OF CONSULTATION:  02/05/2007  DATE OF DISCHARGE:                                 CONSULTATION   SUBJECTIVE:  Ms. Moffa is a 65 year old female referred by Dr. Enid Baas and Dr. [name] II from the family practice clinic at [facility]  for evaluation of a right thigh wound that has been present for three  weeks.   IMPRESSION:  Full-thickness scar with autolysis and suppuration.   RECOMMENDATIONS:  Under field block utilizing 1% Xylocaine, the wound  was thoroughly debrided in the clinic and dressed with a Prisma dressing  and mild compression.  We will follow the patient up in four days for  reevaluation.   SUBJECTIVE:  Ms. Ballard is a 65 year old diabetic hypertensive who has  been followed by a family practice clinic for the past several years.  She noted an open wound approximately a month ago.  She treated it at  home for two weeks and was subsequently seen in the family practice  clinic, examined and referred to the wound center.  She denies interim  fever, drainage or malodor.  She does not recall any trauma.  The wound  has not required pain medication.  There has been no malodor.  No  redness or fever.   PAST MEDICAL HISTORY:  1. Diabetes.  2. Hypertension.   CURRENT MEDICATION LIST:  1. Metformin 850 mg b.i.d.  2. Benazepril 40 mg daily.  3. Lasix 40 mg p.r.n.  4. Metoprolol 50 mg daily.  5. Plavix 75 mg daily.  6. Simvastatin 10 mg h.s.  7. Trazodone 50 mg nightly.  8. Famotidine 20 mg daily.   ALLERGIES:  SHE DENIES ALLERGIES.   PREVIOUS SURGERIES:  1. Cholecystectomy over 10 years ago.  2. Hysterectomy 10 years ago.  3. She had a blood clot removed from her right groin four years ago      after cardiac  catheterization.   FAMILY HISTORY:  Positive for diabetes, hypertension and stroke.   SOCIAL HISTORY:  She is single.  She is medically retired due to  depression.  She has a native of [facility].  She has previously  worked as a Engineer, manufacturing systems.   REVIEW OF SYSTEMS:  Remarkable for 30-pack-years of smoking.  She denies  dyspnea on exertion.  She denies classical angina pectoris.  Her weight  has been stable.  She denies bowel or bladder dysfunction.  She is under  continuous care through the mental health department in [facility].  The remainder of the review of systems is negative.   PHYSICAL EXAMINATION:  GENERAL APPEARANCE:  She is alert, oriented, in  good contact with reality and able to communicate all features of the  history.  VITAL SIGNS:  Her blood pressure is 118/82, respirations 16, pulse rate  is 112, temperature is 98.3.  Capillary blood glucose was not obtained.  The patient does not check her blood sugars at home and has never  checked them.  HEENT:  Exam is clear.  NECK:  Supple.  Trachea is midline.  Thyroid is nonpalpable.  LUNGS:  Clear.  ABDOMEN:  Soft.  The femoral pulses are 2+ on the left.  I do not feel a  femoral pulse on the right.  EXTREMITIES:  There is a thickened, partially separated, liquefied  eschar that is fluctuant on the anterolateral aspect of the right lower  extremity.  The patient reports that it is painful to palpation.  There  is no pulse at the dorsalis pedis level on the right, nor is there pulse  at the posterior level.  The capillary refill is normal and there is no  perception of coolness.  On the left lower extremity there is a +3  bounding pulse, sensation is preserved bilaterally.  There is no edema.   Under field block utilizing 1% Xylocaine, the partially separated eschar  was completely excised, disclosing advancing epithelium from the  periphery and healthy granulation.  This procedure was tolerated well by  the patient.   The wound was irrigated and a Prisma dressing and hydrogel  was applied.   DISCUSSION:  The wound has been adequately debrided during this visit in  the wound center.  We have reapplied the Prisma and the hydrogel.  We  will reevaluate the patient in three days per availability of  appointment.  We have instructed her to take over-the-counter Tylenol.   Her vascular exam is abnormal.  We will proceed with an ABI.  There is  no evidence of acute ischemia at this point, although we cannot palpate  any pulses in the right lower extremity.      Harold A. Tanda Rockers, M.D.  Electronically Signed     HAN/MEDQ  D:  02/05/2007  T:  02/05/2007  Job:  [id number]   cc:   Broadus John T. Pamalee Leyden, MD  [facility]

## 2011-03-11 NOTE — Cardiovascular Report (Signed)
NAME:  [name] Foley                        ACCOUNT NO.:  [id number]   MEDICAL RECORD NO.:  [id number]                   PATIENT TYPE:  INP   LOCATION:  5511                                 FACILITY:  [facility]   PHYSICIAN:  [name] Foley, M.D.                  DATE OF BIRTH:  [birth year]   DATE OF PROCEDURE:  08/15/2002  DATE OF DISCHARGE:                              CARDIAC CATHETERIZATION   PROCEDURE:  Left heart catheterization including:  1. Left ventriculography.  2. Selective left and right coronary arteriography.  3. Right femoral angiography and closure of right femoral arterial access     with Perclose.   INDICATIONS FOR PROCEDURE:  The patient is a 65 year old morbidly obese  African-American female with a history of smoking and depression who was  admitted to the hospital with chest pain.  Given her morbid obesity and  continued chest pain, she was brought to the cardiac catheterization lab to  evaluate her coronary anatomy.  This is for evaluation of suspected coronary  artery disease.   HEMODYNAMIC DATA:  1. The left ventricular pressures were 122/12 with an end diastolic pressure     of 27 mmHg.  2. The aortic pressures were 127/88 with a mean of 109 mmHg.  3. There was no pressure gradient across the aortic valve.   ANGIOGRAPHIC DATA:  1. Left ventricle:  The left ventricular systolic function was normal.  The     ejection fraction was calculated at 54%.  There was no significant mitral     regurgitation.  2. Right coronary artery:  The right coronary artery is codominant with the     circumflex coronary artery.  It gives origin to a moderate to large-sized     PDA.  The right coronary artery was normal.  3. Left main coronary artery:  The left main coronary artery is a large     caliber vessel.  It is normal.  4. Circumflex coronary artery:  The circumflex coronary artery is a very     large caliber vessel.  It gives origin to an AV groove circumflex which   gives some small PDA branches and continues as a very large obtuse     marginal #1.  The obtuse marginal #1 has secondary and tertiary branches.     The obtuse marginal #1 is larger than the circumflex itself.  5. Left anterior descending artery:  The left anterior descending artery is     a large caliber vessel.  It gives origin to a small-sized diagonal #1 and     diagonal #2.  It is normal.  It wraps around the apex.  6. Ramus intermedius:  The ramus branch is very small.  7. Right femoral angiography:  The right femoral angiography revealed good     arterial access site.   IMPRESSION:  1. Chest pain, probably of noncardiac etiology.  Normal coronary arteries.  Chest pain could also be secondary to diastolic dysfunction from obesity.  2. Shortness of breath secondary to morbid obesity, obstructive sleep apnea,     diastolic dysfunction secondary to obesity.  3. Smoking.  4. Alcohol abuse.   RECOMMENDATIONS:  Therapy for GERD.  Exercise program and evaluation for  possible obstructive sleep apnea.   TECHNIQUE OF PROCEDURE:  Under usual sterile precautions using #6 French  right femoral arterial access, a #6 [place] Multipurpose B2 catheter was  advanced into the ascending aorta over a 0.035-inch J wire.  The catheter  was gently advanced to the left ventricle.  Left ventricular pressures were  monitored.  Hand contrast injection of the left ventricle was performed both  in LAO and RAO projections.  The catheter was flushed with saline and pulled  back into the ascending aorta and pressure gradient across the aortic valve  was monitored.  The right coronary artery was selectively engaged and  angiography was performed.  In a similar fashion, the left main coronary  artery was selectively engaged and angiography was performed.  Then the  catheter was pulled out of the body in the usual fashion.  Right femoral  angiography was performed through the arterial access sheath and the   arterial access was closed with Perclose with excellent hemostasis obtained.  The patient was transferred to the outpatient holding area in a stable  condition.                                               [name] Foley, M.D.    Pilar Plate  D:  08/15/2002  T:  08/15/2002  Job:  [id number]   cc:   Blanch Media, MD  [facility]  [place], [place] [address]  Fax: [phone]   Internal Medicine Residency Program

## 2011-03-11 NOTE — H&P (Signed)
NAME:  [name] Foley, [name] Foley              ACCOUNT NO.:  [id number]   MEDICAL RECORD NO.:  [id number]          PATIENT TYPE:  INP   LOCATION:  3023                         FACILITY:  [facility]   PHYSICIAN:  Santiago Bumpers. Hensel, M.D.DATE OF BIRTH:  1946/03/25   DATE OF ADMISSION:  10/25/2006  DATE OF DISCHARGE:                              HISTORY & PHYSICAL   CHIEF COMPLAINT:  Seizure.   HPI:  The patient is a 65 year old [place] American female with multiple  medical problems who had her first presumed seizure on the morning of  October 23, 2006.  She says she had a episode with loss of  consciousness, but this was unwitnessed.  She says I went down because  I felt so weak all over.  She awoke at home and called EMS, per her  report she did not come to the emergency room.  She had a second  unwitnessed event that was again a presumed seizure at about 10 a.m. on  the morning of October 25, 2006.  She came to the ED via EMS after this.  A head CT, at this time, was negative.  Her workup showed no metabolic  abnormalities, other than a potassium of less than 3.0.  This was  repleted and the patient was discharged home.  She subsequently had a  third event shortly after being discharged home from the ED, this had  occurred at about 4 o'clock in the afternoon on January 2.  This was  witnessed by a niece.  The patient had a loss of consciousness and she  was noted to be shaking over her entire body.  This lasted about 3  minutes per report of the family.  The patient was noted to be confused  shortly afterwards, but this gradually resolved.  She came to the ED via  EMS and I was called to admit the patient.   REVIEW OF SYSTEMS:  No fevers.  No chills.  CARDIOVASCULAR:  No chest  pain.  PULMONARY:  No shortness of breath.  SKIN:  No rash.  GU:  No  dysuria. GI:  No nausea.  No vomiting.   PAST MEDICAL HISTORY:  1. History of coronary artery disease with a non-ST elevation      myocardial  infarction in February of 2007.  Patient had an 85%      stenosis of the left anterior descending and underwent PTCA.  2. She also has a history of diabetes mellitus.  3. Smoking.  4. Obesity.  5. Hypertension.   FAMILY HISTORY:  Noted for history of diabetes, coronary artery disease  and end-stage renal disease.   SOCIAL HISTORY:  She lives in [place].  She is single, has no  children.  She lives alone.  She smokes a half pack a day.  She denies  illicit drugs.  She says she drinks up to a pint of Vodka per day.  She  says she has not had any alcohol recently.  Her sister disagrees with  this during the interview.  There is also a disagreement between the two  as to how much cough  syrup she drinks.   MEDICATIONS:  Include the following benazepril/hydrochlorothiazide,  Plavix, Zocor, Wellbutrin, Toprol and clonazepam.   ALLERGIES:  NO KNOWN DRUG ALLERGIES.   OBJECTIVE:  VITAL SIGNS:  Temperature 97.4.  Pulse 91.  Blood pressure  173/92.  Respiratory rate 24.  ScO2 93% on 2 liters O2.  GENERAL APPEARANCE:  No apparent distress.  Normocephalic, atraumatic.  HEENT:  Mucous membranes are moist.  No tongue bite appreciated.  Extraocular movements are intact.  Pupils equally round and reactive.  The funduscopic exam is limited, but otherwise normal.  There is no  oropharyngeal erythema.  MENTAL STATUS:  Alert and oriented x3.  NECK:  Supple.  CHEST:  Clear.  No wheeze.  HEART:  Regular with no murmur.  ABDOMEN:  Soft, nontender, nondistended.  Positive bowel sounds.  Positive obesity.  EXTREMITIES:  Trace edema.  Pulses 2+ dorsalis pedis pulses.  RECTAL EXAM:  Deferred.  NEUROLOGIC:  No pronator drift noted.  Cranial nerves II-XII intact.  Deep tendon reflexes, strength and sensation intact x4.  Balance not  tested.  SKIN:  No rash.  LYMPHADENOPATHY:  None appreciated.   LABS:  _____________ from today show a sodium of 139, potassium 3.0,  creatinine 1.1 and a glucose of 157,  pH 7.49, hemoglobin 16.3.  EKG is  normal sinus rhythm with no significant change from previous.  Labs from  earlier today, showed a potassium of 2.6, a hemoglobin of 12.9 with an  MCV of 72.  Head CT, at this point, showed no acute changes.  INR of  1.0, mag level of 1.8.   ASSESSMENT/PLAN:  Patient is a 65 year old female with the following  problems:  1. Seizure disorder.  This can be due to alcohol, +/- benzo      withdrawal, +/- Wellbutrin and subsequent lowering of seizure      threshold.  This is all aside from the possibility of a new seizure      disorder in and of itself.  I will place the patient on an Ativan      taper and hold her Wellbutrin and continue with neuro checks.  The      patient is at her baseline mentation now and she has a normal      neurologic exam.  There is no need to image her again.  We will put      her on fall precautions, check an EKG and place her on telemetry to      evaluate for a dysrhythmia and also check a UDS.  I have discussed      this case with the EDP and in light of her alcohol use her      fosphenytoin load has been discontinued.  2. Alcohol use.  We will treat the patient with an banana bag and      check an alcohol level.  3. Insomnia.  Ambien p.r.n.  4. Hyperlipidemia.  Check fasting lipid panel and continue statin.  5. Tobacco abuse.  Start nicotine patch.  6. Decreased MCV.  We will hemoccult all stools and check a ferritin.  7. Hypokalemia.  Repleted.  We will follow this.  8. History of coronary artery disease.  Continue with Plavix and      follow blood pressure and fasting lipid panel.  9. Hypertension.  Continue Toprol and Lotensin/hydrochlorothiazide.  10.SCDs to bilateral lower extremities for deep venous thrombosis      prophylaxis and proton pump inhibitor for GI prophylaxis.  11.Full code.  Dwana Curd Para March, M.D.    ______________________________ Santiago Bumpers. Leveda Anna, M.D.    GSD/MEDQ  D:  10/26/2006  T:   10/26/2006  Job:  [id number]   cc:   Broadus John T. Pamalee Leyden, MD

## 2011-03-11 NOTE — Cardiovascular Report (Signed)
NAME:  [name] Foley, [name] Foley                        ACCOUNT NO.:  [id number]   MEDICAL RECORD NO.:  [id number]                   PATIENT TYPE:  OIB   LOCATION:  5706                                 FACILITY:  [facility]   PHYSICIAN:  Vonna Kotyk R. Jacinto Halim, M.D.                  DATE OF BIRTH:  10-Apr-1946   DATE OF PROCEDURE:  09/16/2002  DATE OF DISCHARGE:                              CARDIAC CATHETERIZATION   PROCEDURE:  1. Abdominal aortogram.  2. Abdominal aortogram with right femoral runoff.   INDICATIONS:  The patient is a 65 year old African-American female with  history of hypertension who had undergone coronary arteriography on 08/15/02  for chest pain and post angiography she developed right lower extremity  claudication.  Given this, the suspicion of complication of Perclose closure  device was clinical cut off and the patient underwent Doppler evaluation  which revealed an occluded iliac artery.  She was brought to the  catheterization lab to evaluate her peripheral anatomy.   ABDOMINAL AORTOGRAM:  Abdominal aortogram revealed normal abdominal aorta.  There was two renal arteries, one on either side and they were normal.  Right iliac artery with femoral runoff.  The iliac artery femoral runoff  revealed the external aortic artery was completely occluded just after its  origin.  The internal iliac artery gives collaterals to the common femoral  artery which reconstitutes just before it bifurcation into profunda and  superficial femoral artery.  Otherwise, the femoral arterial runoff in the  thigh is normal and in the legs there is a three vessel runoff with no  significant peripheral vascular disease.   Left iliac artery with femoral runoff revealed normal left iliac artery.  The femorals and the superficial femoral artery were all normal and in the  leg there was three vessel runoff.   IMPRESSION:  1. 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  This is probably     secondary to iatrogenic cause from Perclose closure use during cardiac     catheterization on 08/15/02.   RECOMMENDATIONS:  Given her significant symptoms in which the patient is  unable to walk around the block, an ileofemoral bypass is probably  indicated.  We will ask Dr. Marcy Panning to evaluate the patient.  Further  recommendations to follow.  The patient will be admitted for observation for  23 hours.   PROCEDURE:  With the usual sterile precautions using 5 French left femoral  venous access and the 5 French left femoral arterial access, a pigtail  catheter was advanced under the abdominal aorta and abdominal aortogram was  performed.  The catheter was pulled.  This was done by hand contrast  injection.  The catheter was pulled back into the lower abdominal  aorta and  abdominal aortogram with femoral runoff was performed.  Then, the catheter  was pulled out of the body.   Then, a 5 [place] LIMA catheter was advanced into the lower abdominal aorta  and the right iliac artery was selectively engaged and angiography was  performed.  Then, a Wooly wire was utilized to fit the catheter into the  external iliac artery and an end-hole catheter was advanced toward this wire  and angiography was performed.  The occlusion was partially crossed over  with help of a glide wire and angiography was performed.  The glide wire was  unable to cross through the common femoral occlusion.  Hence, the procedure  was abandoned.  The wire was pulled out of the iliac artery and the catheter  was pulled back and the catheter was pulled out of the body in the usual  fashion and the arterial access sheath was pulled with manual pressure being  held and adequate hemostasis obtained.  The patient  was transferred to  recovery in stable condition.  The patient  tolerated the  procedure well.                                               Cristy Hilts. Jacinto Halim, M.D.    Pilar Plate  D:  09/16/2002  T:  09/16/2002  Job:  [id number]   cc:   [facility]

## 2011-03-11 NOTE — Assessment & Plan Note (Signed)
[facility]   NAME:  Foley, [name]              ACCOUNT NO.:  [id number]   MEDICAL RECORD NO.:  [id number]      DATE OF BIRTH:  Apr 16, 1946   PHYSICIAN:  Theresia Majors. Tanda Rockers, M.D. VISIT DATE:  02/15/2007                                   OFFICE VISIT   SUBJECTIVE:  [name] Foley is a 65 year old lady whom we are seeing for a  traumatic injury to the right anterolateral thigh.  In the interim we  have treated her with over-the-counter bacitracin, daily antiseptic soap  washes and a fish net dressing to avoid compression.  In the interim she  denies fever, pain or malodor.  She has been admitted to the hospital in  the interim for dehydration.  She spent two nights and was discharged.  She has had no recurrent symptoms.   OBJECTIVE:  Her blood pressure is 120/84, respirations 20, pulse rate  77, temperature 98.3.  Inspection of the wound shows there is  advancement of epithelium from the periphery.  There is otherwise 100%  healthy appearing granulation with no need for debridement.  On the  right there is 0.9, on the left it is greater than 1.   IMPRESSION:  Healthy granulating wound.   PLAN:  We will continue open management utilizing daily showers with  antiseptic soap and a noncompressive dressing.  We will reevaluate the  patient in two weeks p.r.n.      Harold A. Tanda Rockers, M.D.  Electronically Signed     HAN/MEDQ  D:  02/15/2007  T:  02/15/2007  Job:  [id number]

## 2011-03-11 NOTE — H&P (Signed)
NAME:  Foley, [name]              ACCOUNT NO.:  [id number]   MEDICAL RECORD NO.:  [id number]          PATIENT TYPE:  OBV   LOCATION:  4735                         FACILITY:  [facility]   PHYSICIAN:  Wilhemina Bonito, M.D.     DATE OF BIRTH:  20-May-1946   DATE OF ADMISSION:  02/09/2007  DATE OF DISCHARGE:                              HISTORY & PHYSICAL   CHIEF COMPLAINT:  Syncope.   HISTORY OF PRESENT ILLNESS:  Patient is a 65 year old African-American  female who presented to the ER after passing out.  She states that she  originally passed out on Monday, she was getting up to go to the  bathroom and felt diaphoretic and sick to her stomach and then passed  out.  There was no trauma noted.  Patient has poor intake by mouth over  the past few days due to gum problems, states she has been taking all of  her blood pressure medications regularly.  She passed out again today  and states that she felt chills, but no reported temperature.  She did  feel like she was going to pass out though with initial symptoms.   PAST MEDICAL HISTORY:  Significant:  1. Tobacco dependence.  2. Obesity.  3. History of MI.  4. Insomnia.  5. Hypertension.  6. Type 2 diabetes.  7. Coronary artery disease.  8. Hyperlipidemia.  9. Abrasion on the lower extremity.  10.ALLERGIC TO VOLTAREN.  11.Significant for 85% stenosis of the LAD and PCTA with a history of      seizures when on Klonopin and alcohol withdrawal.  12.She had a history of non-ST-elevated MI 2007.   She takes:  1. Aspirin 325 one tablet p.o. daily.  2. Benazepril 40 mg one tablet p.o. daily.  3. Famotidine 20 mg one tablet p.o. twice a day.  4. Metformin 850 mg one tablet p.o. twice a day, if having diarrhea      then take one.  5. Metoprolol 50 mg one tablet twice daily.  6. Nitroglycerin 0.4 mg under the tongue as needed.  7. Plavix 75 mg one tablet once daily for stent.  8. Simvastatin 10 mg one tablet p.o. every night.  9. Trazodone 50  mg one to two tablets as needed for sleep.   PAST SURGICAL HISTORY:  1. She had a known catheterization in 2007 in March.  Last known lipid      panel was on December 05, 2006 which showed triglycerides of 74,      HDL of 42, LDL of 41.  2. She has had a total abdominohysterectomy for fibroids in 1994.   FAMILY HISTORY:  She has two sisters, a brother with coronary artery  disease and end-stage renal disease, a dad with liver cirrhosis and mom  with diabetes and a colon resection.   SOCIAL HISTORY:  She is currently disabled due to pain.  She lives in  [place].  She is single.  She has no kids.  She does have a niece who  is present in the room.  She smokes half-a-pack per day.  She uses  occasional alcohol,  but no drug usage.   PHYSICAL EXAMINATION:  VITAL SIGNS:  O2 sats are 97%, temperature 98.6,  pulse rate 85 per minute, blood pressure was 91/67.  GENERAL:  She is well developed and well nourished, no acute distress,  alert and appropriate, cooperative throughout the exam.  HEENT:  Normocephalic, atraumatic without obvious abnormalities.  No  corneal or conjunctival inflammation.  Extraocular muscles are intact.  Pupils are equally round and reactive to light bilaterally.  External  air was normal.  Oral mucosa and oropharynx were dry with poor  dentition.  NECK:  Had no deformities, masses or tenderness.  LUNGS:  Had normal respiratory effort.  Lungs are clear to auscultation  with no crackles or wheezes.  CHEST:  Expanded symmetrically.  HEART:  Normal rate and regular rhythm.  S1 and S2 were normal without  gallop, click or rub.  ABDOMEN:  Soft with positive bowel sounds.  Nontender without masses or  organomegaly.  RECTAL EXAM:  Showed no external abnormalities.  Normal sphincter tone.  No masses or tenderness.  It was _______ positive per the report from  the ED physician.  MUSCULOSKELETAL:  She had normal range or motion with a right 3 cm  abrasion being followed  [facility].  She had decreased  perfusion of bilateral feet.  PULSES:  Right dorsalis pedis pulse absent and left was absent as well.  EXTREMITIES:  There was cool touch to the feet.  NEUROLOGIC:  She is alert and oriented x3.  Cranial nerves II-XII  intact.  Intact normal strength and sensation was also intact.   PROBLEM:  1. Dizziness.  Will place her under 23-hour obs.  I do think that this      dizziness/syncope is most likely secondary to probably some      orthostatic hypotension in the setting of the fact that this      patient uses antihypertensives regularly and has had declined p.o.      intake.  She does have fecal occult blood-positive, but her      hemoglobin was stable at 13.7 on admission.  She was not      tachycardic at all.  She actually had hypotension, but this is most      likely secondary due to the fact the patient taking her blood      pressure medicines.  At this point in time, she has no signs of      CHF.  Will go ahead and give her one liter of normal saline bolus      now and then continue her at 125 mL an hour of D5 half-normal      saline.  Her last EF was 65% in 2003.  Will go ahead and re-hydrate      her, encourage mouth hydration, place her on telemetry and observe      for any arrhythmias.  Her chest pain is atypical, however since the      fact she had known coronary artery disease and a history of MI and      a diabetic, will check cardiac enzymes.  2. For blood in her stool, her hemoglobin is stable.  The patient      states that she has not noted any blood in her bowel movements      today.  She did accidentally take an aspirin and two Plavix the      other day and noticed blood in her stool.  Will recheck  her      hemoglobin and hematocrit tonight and in the a.m. and place her on      Protonix during the intermediate time.  3. Diabetes mellitus type 2.  Patient is only on metformin as an     outpatient.  Will check CBGs and cover  her with sliding-scale      insulin for now.  Will check a hemoglobin A1c.  4. For insomnia, will give her Trazodone as needed.  5. For tobacco dependence, will give her Nicotine patch.  6. For her hypertension, she is actually hypotensive now so we will      replace her with fluids.  We are going to hold her beta-blocker and      ACE inhibitor for now.  We may need to restart these in the a.m.,      but at this time we are going to hold them.  7. For hyperlipidemia, we will not check a fasting lipid profile due      to the fact the patient recently had this done, but will continue      her simvastatin while she is inpatient.  8. For abrasion, will do daily wound check changes and a wound care      consult, it does not look like a cellulitis at this point and      actually looks much improved.      Wilhemina Bonito, M.D.     ML/MEDQ  D:  02/09/2007  T:  02/10/2007  Job:  [id number]

## 2011-03-11 NOTE — Consult Note (Signed)
NAME:  [name] Foley, [name] Foley                        ACCOUNT NO.:  [id number]   MEDICAL RECORD NO.:  [id number]                   PATIENT TYPE:  OIB   LOCATION:  5706                                 FACILITY:  [facility]   PHYSICIAN:  Lorre Munroe., M.D.            DATE OF BIRTH:  19-Apr-1946   DATE OF CONSULTATION:  09/16/2002  DATE OF DISCHARGE:                                   CONSULTATION   REASON FOR CONSULTATION:  Occlusion of the right iliac artery.   HISTORY OF PRESENT ILLNESS:  The patient is a 65 year old black female who  underwent cardiac catheterization because of chest pain on 08/15/2002.  She  was found to have essentially normal coronary arteries and cardiac function,  and the chest pain was felt to be noncardiac in origin.  After the  procedure, the patient has developed pain in the right lower extremity,  intermittent numbness in the right foot, and difficulty walking because of  extreme tiredness and pain in the right leg.  Noninvasive evaluation  suggested iliac occlusion.  Arteriography done today by Dr. Jacinto Halim  demonstrates occlusion of the right external iliac artery with good  reconstitution of the right common femoral by pelvic collaterals.  The  profunda femoris and superficial femoral and distal vessel runoff are  normal, and there is excellent inflow.  The left-sided vessels are  essentially normal as well.  I am consulted concerning the possibility of  reconstructive surgery to restore normal function in the right leg.   PAST MEDICAL HISTORY:  The patient suffers from obesity.  She has  hypertension.   MEDICATIONS:  She is on a number of medicines including Remeron, Darvocet,  Neurontin, Wellbutrin, Ativan, Trazodone, and Klonopin.  I assume these are  for neuromuscular disorders.  She denies any lung disease.   PHYSICAL EXAMINATION:  GENERAL:  She is obese.  EXTREMITIES:  The femoral pulses are difficult to find on both sides because  of her obesity.   There is a good left dorsalis pedis pulse.  There are no  ulcerations on the lower extremities.  I cannot feel a pulse in the right  foot, and the right foot is somewhat cooler than the left.  There is  adequate capillary filling present.   IMPRESSION:  Occlusion of right external iliac artery with essentially  normal inflow and runoff.    RECOMMENDATIONS:  If thrombolysis is not thought to be a good idea, I will  be happy to do exploration of the right groin with an attempt at  thrombectomy and patch angioplasty.  If that is not successful, will do a  femoral-to-femoral bypass.  The patient is willing to have this done.  Lorre Munroe., M.D.    WB/MEDQ  D:  09/16/2002  T:  09/16/2002  Job:  [id number]   cc:   Cristy Hilts. Jacinto Halim, M.D.  [address]  [place]  [place] [address]  Fax: [phone]   Blanch Media, M.D.  [facility]  [place], [place] [address]  Fax: [phone]

## 2011-03-11 NOTE — Cardiovascular Report (Signed)
NAME:  [name] Foley, [name] Foley              ACCOUNT NO.:  [id number]   MEDICAL RECORD NO.:  [id number]          PATIENT TYPE:  OBV   LOCATION:  6527                         FACILITY:  [facility]   PHYSICIAN:  Richard A. Alanda Amass, M.D.DATE OF BIRTH:  1946/07/18   DATE OF PROCEDURE:  11/25/2005  DATE OF DISCHARGE:  11/26/2005                              CARDIAC CATHETERIZATION   PROCEDURE:  Retrograde central aortic catheterization, selective coronary  angiography via Judkins technique, pre and post intracoronary nitroglycerin  administration, subselective left internal mammary artery, left ventricular  angiogram, right anterior oblique/left anterior oblique projections,  abdominal aortic angiogram, right iliac angiogram, weight-adjusted heparin,  additional aspirin and in the laboratory, 5 grains, Plavix 600 mg orally,  Aggrastat double bolus plus infusion, Diver thrombectomy, grade left  anterior descending thrombotic ruptured plaque stenosis, direct stenting  with 3.5/18 plus 3.5/12 overlapping TAXUS stents, post-dilatation with 4.0  balloon.   BRIEF HISTORY:  [name] Foley is a 65 year old single Afro-American woman who  is a smoker and just stopped.  She has severe exogenous obesity, GERD,  history of depression with disability related to this, on chronic medical  therapy, presumed hyperlipidemia and metabolic syndrome to be further  evaluated.  She has undergone remote catheterization by Dr. Jacinto Halim, August 15, 2002, and was found to have normal coronary arteries and left  ventricle and they felt that her chest pain then was probably nonischemic.  Unfortunately, she had dissection of her right external iliac during the  procedure noted after Perclose closure.  Attempts at recanalization of this  by Dr. Jacinto Halim via the contralateral technique and subsequently on September 16, 2002 were unsuccessful.  For this reason, she underwent right  iliofemoral thrombectomy with balloon catheter and right  femoral patch graft  angioplasty by Dr. Orson Slick on September 18, 2002.  She has had mild recurrent  right lower extremity claudication.  Her activity level is limited with her  exogenous obesity, chronic back pain and depression.  Over the last 2-3  days, she has been having recurrent episodes of moderately severe to severe  substernal chest pain that has radiating to her left upper extremity.  She  was admitted November 24, 2005 and had elevated troponins with normal CPKs  and MBs, compatible with non-ST-elevation SEMI.  She was seen on rounds by  Dr. Allyson Sabal on November 25, 2005 and it was felt that catheterization was  indicated because of her severe risk factors, character of her pain and  elevated troponins.  Informed consent was obtained to proceed.   DESCRIPTION OF PROCEDURE:  The patient was brought to the second floor CP  lab in a post-absorptive state after 5 mg of Valium p.o. premedication.  She  received intermittent Versed, a total of 6 mg IV during the procedure, and 4  mg of Nubain for back discomfort.  The L CFA was entered with a single  anterior puncture using an 18 thin-wall needle and a 6-French short Daig  sidearm sheath was inserted without difficulty.  Diagnostic coronary  angiography was done pre and post IC nitroglycerin administration with 6-  French 4-cm taper  Cordis preformed coronary and pigtail catheters, using  Omnipaque dye throughout the procedure.  Subselective LIMA revealed widely  patent LIMA and subclavian with kinking of the subclavian related to angle,  but no significant stenosis and good flow.  LV angiogram was done in the RAO  and LAO projection at 25 mL, 14 mL per second, and 20 mL at 12 mL per  second.  Pullback pressure in the CA was performed and showed no gradient  across the aortic valve.  Catheter was pulled down to the level above the  renal arteries and an abdominal aortic angiogram was done the midstream PA  projection at 25 mL, 20 mL per  second.  This demonstrated widely patent  single renal arteries bilaterally, patent SMA and celiac and a patent IMA.  Another injection was done above the iliac bifurcation at 20 mL 20 mL per  second, with follow-through to the right iliac SFA-profunda junction.  This  demonstrated total occlusion of the right external iliac just after its  origin.  The hypogastric was large and intact and there were collaterals  with reconstitution at the SFA profunda junction of the SFA and profunda.  The total occlusion extended to the distal R CFA.   The patient tolerated the diagnostic procedure well.   PRESSURES:  LV: 140/0; LVEDP 20-22 mmHg.   CA: 140/80  mmHg.   There is no gradient across the valve on catheter pullback.   Fluoroscopy did not reveal any significant coronary intracardiac or valvular  calcification.   The main left coronary was normal.   The LAD had a very unusual eccentric 85% stenosis after the first small-to-  moderate-sized diagonal and first septal perforator before the second septal  perforator.  This appeared to be a ruptured plaque with thrombus and slight  dissection associated just beyond the stenosis.  There was some mild  poststenotic dilatation.  The remainder of the LAD had no significant  stenosis and was smooth and coursed to the apex of the heart, where it  bifurcated.  The small DX-2 from the mid LAD bifurcated had no significant  stenosis.  The DX-3 from the distal third had no significant stenosis.   The circumflex was widely patent and comprised of a very large proximal OM-1  that trifurcated twice distally.  The CX proper  and PAVG groove branches  were small-to-a-moderate-sized and with no significant stenosis.   The right coronary was a dominant large vessel; it was widely patent and  smooth throughout its course, giving off an RV branch from the proximal and  midportion, conus branch proximally and distal PDA and PLA with no significant stenosis.   No collaterals to the LAD.   It was elected to proceed with PCI in the setting of NSTEMI and high-grade  LAD stenosis with ruptured plaque.  It should be noted that the LAD stenosis  was difficult to see because of tortuosity and would best seen in the  cranial steep RAO projection.   The patient was given additional 5 grains of aspirin, 600  mg of Plavix, 20  mg of Pepcid IV.  She was sedated, as outlined above, for back discomfort.  ACT was therapeutic and monitored throughout the procedure.  She had been  given heparin preoperatively.  The initial cannulation of the LAD with an FL-  4 guide and a required ASAHI soft wires with special bends, but were not  able to enter the LAD well because of its right angle takeoff and large  main  left.  We then switched to a Scimed Q4 LAD guide and an 0.014 ASAHI Prowater  wire with a secondary bend and I was able to cross into the LAD across the  stenosis and free in the distal LAD.  I initially past a Diver thrombectomy  catheter across the lesion and 3 thrombectomy runs were done.  There was  still some minimal residual thrombus beyond the lesion, but this was stable.  There was no reflow phenomenon throughout the procedure.   We then crossed the lesion with a 3.5/18 TAXUS stent, which was positioned  fluoroscopically, deployed at 12-23 and post-dilated at 15-24.  Injection  after balloon removal showed that there was dissection in the area of  ruptured plaque just beyond the stent edge with some residual thrombus.  This was covered with an overlapping 3.5/12 TAXUS Express II DES stent.  It  was positioned fluoroscopically and deployed at 10-40 and post-dilated at 12-  26.  The balloon was exchanged for an upgraded 4.0/18 PowerSail balloon.  This was positioned within a stented area and dilated to 14-40.  It was then  removed and final injections post IC nitroglycerin administration showed  excellent angiographic result, no residual stenosis,  TIMI-3 flow and 0%, and  full stent deployment angiographically.  Stenosis was reduced to 0%.  The  dilatation system was removed, sidearm sheath was flushed and secured to the  skin to prevent migration.  The patient tolerated procedure well and final  ACT was 316 seconds.   She will be followed closely postoperatively.  Ancillary fasting lipids and  A1c will be done and she will be started on statins, aspirin, Plavix, beta-  blocker and PPI.  Depending on her blood pressure, she might be a candidate  for ACE inhibition for endothelial stabilization.  She has recently said  that she stopped smoking and hopefully this will be the case.  Obvious  weight reduction, cardiac rehab and exercise program may be helpful if it  can be accomplished in the long run.   LV angiogram was done which showed anterolateral wall hypokinesis compatible  with her non-ST-elevation MI and otherwise good contraction with an EF of greater than 50% and no mitral regurgitation.   CATHETERIZATION DIAGNOSES:  1.  Non-ST-segment elevation myocardial infarction  -- anterior wall      subacute endocardial myocardial infarction with high-grade left anterior      descending stenosis and ruptured plaque configuration.  2.  Anterolateral wall hypokinesis.  3.  Severe exogenous obesity.  4.  Probable hyperlipidemia.  5.  Probable metabolic syndrome.  6.  Successful Diver catheter thrombectomy and subsequent tandem large DES      stenting, 3.5/18 plus 3.5/12 TAXUS overlapping stents post-dilated with      4.0 balloon with successful reperfusion, no side branch occlusion and      TIMI-3 flow.  7.  Chronic back pain.  8.  Severe destruction and disability.  9.  Past heavy cigarette smoker.      Richard A. Alanda Amass, M.D.  Electronically Signed     RAW/MEDQ  D:  11/25/2005  T:  11/26/2005  Job:  [id number]   cc:   Pearlean Brownie, M.D.  Fax: [phone]   [facility]   Dani Gobble, MD  Fax: [phone]    [place], [place] Governor Rooks MD

## 2011-03-11 NOTE — Op Note (Signed)
NAME:  [name] Foley, [name] Foley                        ACCOUNT NO.:  [id number]   MEDICAL RECORD NO.:  [id number]                   PATIENT TYPE:  INP   LOCATION:  5706                                 FACILITY:  [facility]   PHYSICIAN:  Lorre Munroe., M.D.            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   POSTOPERATIVE DIAGNOSIS:  Thrombosis of right external iliac artery with  dissection of hematoma and posterior plaque.   OPERATION PERFORMED:  Right iliofemoral thrombectomy with balloon catheter  and right femoral patch graft angioplasty.   SURGEON:  Lebron Conners, M.D.   ASSISTANT:  Magnus Ivan, RN FA   ANESTHESIA:  General.   DESCRIPTION OF PROCEDURE:  After the patient was given general anesthesia  and anesthetized and had routine preparation and draping of both groins and  lower abdomen and thighs, I made a longitudinal incision in the right groin  at the site where a cardiac catheterization had been performed.  I dissected  down through the soft tissues and finally located the femoral vessels which  were quite deep as the patient was quite large.  I dissected out the common,  deep and superficial femoral arteries.  There was no pulse present.  I then  controlled all those vessels.  I could feel a little bit of posterior plaque  but the vessels felt healthy.  I felt that attempt at thrombectomy was in  order.  I selected a #5 Fogarty catheter.  After the patient was  heparinized, I tightly controlled on all the vessels and made a longitudinal  arteriotomy on the distal common femoral artery.  I then checked for good  back bleeding of profunda and superficial femoral and found it to be pretty  good.  I filled each vessel with heparinized saline solution.  I then passed  the catheter up the femoral and iliac  arteries and it went up without  difficulty.  I inflated the balloon in the artery and aorta and pulled it  back down and then carefully thrombectomy.  On the third pass, a goodly  amount of plaque with meniscus came out and flow was pretty good.  However,  it was not quite as good as I thought it should be.  In inspecting the  common femoral artery, the posterior plaque appeared darker than it should  and when control was loosened, blood was seen to come through a hole in the  plaque.  I suspected that this might have represented injury from needle and  then subsequent dissection upward in the common femoral causing the  thrombosis.  I took a Astronomer and carefully dissected the plaque  away from the vessel wall.  I have found that indeed it did contain  considerable old hematoma.  I dissected it down to the orifices of the  profunda and superficial femorals and divided it transversely at that point.  Posteriorly, I sutured it down with through-and-through sutures of 6-0  Prolene to prevent further distal dissection.  I then dissected upwards some  more, using Freer and a right angle clamp, removed more of the proximal  plaque and then excellent forward flow was obtained.  I again filled the  distal vessels with heparinized saline and then performed patch angioplasty  utilizing a Hemashield Finesse Dacron patch.  I back bled the vessels, then  bled the vessel forward and found no clot.  A couple of patch sutures were  required laterally and then hemostasis was good; however, after closing the  wound, further bleeding was noted and I explored the area again and found a  small open vessel on the posteromedial aspect of the common femoral artery  and sutured that with a U stitch of 6-0 Prolene and it held well.  6-0  Prolene was used to sew the patch on as well.  After assuring good  hemostasis and after correct sponge, needle and instrument counts, I closed  the groin wound in two  layers with 2-0  Vicryl suture and then with staples.  At the time of beginning of the  closure, there was an excellent pulse in the profunda and superficial  femoral arteries.  The patient was stable throughout the procedure and went  to the recovery room in good condition.                                               Lorre Munroe., M.D.    WB/MEDQ  D:  09/18/2002  T:  09/19/2002  Job:  [id number]   cc:   Dr. Nadara Eaton

## 2011-04-05 ENCOUNTER — Encounter: Payer: Self-pay | Admitting: Sports Medicine

## 2011-05-31 ENCOUNTER — Emergency Department (HOSPITAL_COMMUNITY): Payer: Medicaid Other

## 2011-05-31 ENCOUNTER — Emergency Department (HOSPITAL_COMMUNITY)
Admission: EM | Admit: 2011-05-31 | Discharge: 2011-05-31 | Disposition: A | Discharge status: Home / Self Care | Payer: Medicaid Other | Attending: Emergency Medicine | Admitting: Emergency Medicine

## 2011-05-31 DIAGNOSIS — E119 Type 2 diabetes mellitus without complications: Secondary | ICD-10-CM | POA: Insufficient documentation

## 2011-05-31 DIAGNOSIS — I10 Essential (primary) hypertension: Secondary | ICD-10-CM | POA: Insufficient documentation

## 2011-05-31 DIAGNOSIS — M25579 Pain in unspecified ankle and joints of unspecified foot: Secondary | ICD-10-CM | POA: Insufficient documentation

## 2011-05-31 DIAGNOSIS — S8263XA Displaced fracture of lateral malleolus of unspecified fibula, initial encounter for closed fracture: Secondary | ICD-10-CM | POA: Insufficient documentation

## 2011-05-31 DIAGNOSIS — I2510 Atherosclerotic heart disease of native coronary artery without angina pectoris: Secondary | ICD-10-CM | POA: Insufficient documentation

## 2011-05-31 DIAGNOSIS — Y92009 Unspecified place in unspecified non-institutional (private) residence as the place of occurrence of the external cause: Secondary | ICD-10-CM | POA: Insufficient documentation

## 2011-05-31 DIAGNOSIS — X500XXA Overexertion from strenuous movement or load, initial encounter: Secondary | ICD-10-CM | POA: Insufficient documentation

## 2011-05-31 DIAGNOSIS — M7989 Other specified soft tissue disorders: Secondary | ICD-10-CM | POA: Insufficient documentation

## 2011-07-14 LAB — COMPREHENSIVE METABOLIC PANEL
ALT: 21
ALT: 13
AST: 41 — ABNORMAL HIGH
Albumin: 2.6 — ABNORMAL LOW
Alkaline Phosphatase: 82
BUN: 7
CO2: 28
Chloride: 99
Chloride: 92 — ABNORMAL LOW
Creatinine, Ser: 0.69
GFR calc Af Amer: >60
GFR calc non Af Amer: >60
Potassium: 2.9 — ABNORMAL LOW
Sodium: 133 — ABNORMAL LOW
Total Bilirubin: 1.3 — ABNORMAL HIGH
Total Bilirubin: 1.5 — ABNORMAL HIGH

## 2011-07-14 LAB — DIFFERENTIAL
Basophils Absolute: 0
Basophils Relative: 0
Eosinophils Absolute: 0.3
Eosinophils Relative: 3
Monocytes Absolute: 0.7
Neutro Abs: 7

## 2011-07-14 LAB — CBC
HCT: 41.2
Hemoglobin: 12
Hemoglobin: 12.3
Hemoglobin: 13.6
MCHC: 32.4
MCV: 76.8 — ABNORMAL LOW
MCV: 77.3 — ABNORMAL LOW
Platelets: 345
RBC: 4.84
RBC: 4.96
WBC: 8
WBC: 8.6

## 2011-07-14 LAB — LIPID PANEL: HDL: 14 — ABNORMAL LOW

## 2011-07-14 LAB — POTASSIUM: Potassium: 3.1 — ABNORMAL LOW

## 2011-07-14 LAB — BASIC METABOLIC PANEL
CO2: 26
Chloride: 103
GFR calc Af Amer: >60
Sodium: 138

## 2011-07-14 LAB — URINALYSIS, ROUTINE W REFLEX MICROSCOPIC
Glucose, UA: NEGATIVE
Hgb urine dipstick: NEGATIVE
Ketones, ur: >80 — AB
pH: 6

## 2011-07-14 LAB — HEPATIC FUNCTION PANEL
Alkaline Phosphatase: 204 — ABNORMAL HIGH
Indirect Bilirubin: 1 — ABNORMAL HIGH
Total Bilirubin: 1.4 — ABNORMAL HIGH

## 2011-07-14 LAB — URINE MICROSCOPIC-ADD ON

## 2011-07-14 LAB — MAGNESIUM: Magnesium: 1.8

## 2011-07-15 LAB — COMPREHENSIVE METABOLIC PANEL
ALT: 29
AST: 41 — ABNORMAL HIGH
Albumin: 2 — ABNORMAL LOW
Alkaline Phosphatase: 201 — ABNORMAL HIGH
Calcium: 7.8 — ABNORMAL LOW
GFR calc Af Amer: >60
Glucose, Bld: 88
Potassium: 3 — ABNORMAL LOW
Sodium: 134 — ABNORMAL LOW
Total Protein: 5.1 — ABNORMAL LOW

## 2011-07-15 LAB — BASIC METABOLIC PANEL
BUN: <1 — ABNORMAL LOW
CO2: 28
Chloride: 102
Creatinine, Ser: 0.57
Glucose, Bld: 93
Potassium: 3.7

## 2011-07-15 LAB — CBC
HCT: 37.6
Hemoglobin: 12.1
WBC: 8.3

## 2011-07-18 LAB — COMPREHENSIVE METABOLIC PANEL
ALT: 29
ALT: 43 — ABNORMAL HIGH
AST: 23
AST: 53 — ABNORMAL HIGH
Albumin: 3.4 — ABNORMAL LOW
Alkaline Phosphatase: 127 — ABNORMAL HIGH
Alkaline Phosphatase: 141 — ABNORMAL HIGH
BUN: 10
BUN: 8
CO2: 27
Calcium: 8.7
Calcium: 9
Chloride: 104
Chloride: 105
Creatinine, Ser: 0.65
GFR calc Af Amer: >60
GFR calc Af Amer: >60
GFR calc non Af Amer: >60
GFR calc non Af Amer: >60
Glucose, Bld: 121 — ABNORMAL HIGH
Glucose, Bld: 95
Potassium: 3.7
Potassium: 4.2
Sodium: 136
Sodium: 139
Total Bilirubin: 0.3
Total Bilirubin: 0.6
Total Protein: 6
Total Protein: 6.1

## 2011-07-18 LAB — CARDIAC PANEL(CRET KIN+CKTOT+MB+TROPI)
CK, MB: 1.5
CK, MB: 4.5 — ABNORMAL HIGH
CK, MB: 8.7 — ABNORMAL HIGH
Relative Index: 5.3 — ABNORMAL HIGH
Relative Index: 6.7 — ABNORMAL HIGH
Total CK: 74
Troponin I: 1.41
Troponin I: 1.6

## 2011-07-18 LAB — POCT I-STAT, CHEM 8
BUN: 13
Chloride: 103
HCT: 43
Potassium: 3.1 — ABNORMAL LOW

## 2011-07-18 LAB — CBC
HCT: 37.4
HCT: 39.4
Hemoglobin: 11.7 — ABNORMAL LOW
Hemoglobin: 12.1
MCHC: 32.4
MCV: 73.8 — ABNORMAL LOW
Platelets: 429 — ABNORMAL HIGH
RBC: 4.95
RBC: 5.07
RDW: 19.3 — ABNORMAL HIGH
WBC: 5.6
WBC: 6.8
WBC: 8

## 2011-07-18 LAB — LIPID PANEL
Cholesterol: 89
Triglycerides: 59

## 2011-07-18 LAB — DIFFERENTIAL
Basophils Absolute: 0
Lymphocytes Relative: 30
Lymphs Abs: 2.4
Monocytes Absolute: 0.5
Neutro Abs: 4.9

## 2011-07-18 LAB — POCT CARDIAC MARKERS
CKMB, poc: 1.1
Operator id: 282201
Troponin i, poc: <0.05

## 2011-07-18 LAB — LIPASE, BLOOD
Lipase: 184 — ABNORMAL HIGH
Lipase: 56

## 2011-07-18 LAB — BASIC METABOLIC PANEL
CO2: 23
Chloride: 109
Glucose, Bld: 72
Potassium: 4.3
Sodium: 138

## 2011-07-18 LAB — PROTIME-INR
INR: 1
Prothrombin Time: 13

## 2011-07-18 LAB — AMYLASE: Amylase: 136 — ABNORMAL HIGH

## 2011-07-18 LAB — CK TOTAL AND CKMB (NOT AT ARMC): Relative Index: INVALID

## 2011-07-18 LAB — CANCER ANTIGEN 19-9: CA 19-9: <1.2 — ABNORMAL LOW (ref <35.0)

## 2011-10-20 ENCOUNTER — Other Ambulatory Visit: Payer: Self-pay | Admitting: Sports Medicine

## 2011-10-20 NOTE — Telephone Encounter (Signed)
Refill request

## 2011-11-25 DIAGNOSIS — J189 Pneumonia, unspecified organism: Secondary | ICD-10-CM

## 2011-11-25 HISTORY — DX: Pneumonia, unspecified organism: J18.9

## 2011-12-02 ENCOUNTER — Encounter (HOSPITAL_COMMUNITY): Payer: Self-pay

## 2011-12-02 ENCOUNTER — Inpatient Hospital Stay (HOSPITAL_COMMUNITY): Payer: Medicaid Other

## 2011-12-02 ENCOUNTER — Emergency Department (HOSPITAL_COMMUNITY): Payer: Medicaid Other

## 2011-12-02 ENCOUNTER — Inpatient Hospital Stay (HOSPITAL_COMMUNITY)
Admission: EM | Admit: 2011-12-02 | Discharge: 2011-12-06 | DRG: 438 | Disposition: A | Discharge status: Home / Self Care | Payer: Medicaid Other | Attending: Family Medicine | Admitting: Family Medicine

## 2011-12-02 DIAGNOSIS — J189 Pneumonia, unspecified organism: Secondary | ICD-10-CM | POA: Diagnosis present

## 2011-12-02 DIAGNOSIS — E785 Hyperlipidemia, unspecified: Secondary | ICD-10-CM | POA: Diagnosis present

## 2011-12-02 DIAGNOSIS — F17200 Nicotine dependence, unspecified, uncomplicated: Secondary | ICD-10-CM | POA: Diagnosis present

## 2011-12-02 DIAGNOSIS — Z8249 Family history of ischemic heart disease and other diseases of the circulatory system: Secondary | ICD-10-CM

## 2011-12-02 DIAGNOSIS — E119 Type 2 diabetes mellitus without complications: Secondary | ICD-10-CM | POA: Diagnosis present

## 2011-12-02 DIAGNOSIS — F1010 Alcohol abuse, uncomplicated: Secondary | ICD-10-CM | POA: Diagnosis present

## 2011-12-02 DIAGNOSIS — K859 Acute pancreatitis without necrosis or infection, unspecified: Principal | ICD-10-CM | POA: Diagnosis present

## 2011-12-02 DIAGNOSIS — Z833 Family history of diabetes mellitus: Secondary | ICD-10-CM

## 2011-12-02 DIAGNOSIS — R197 Diarrhea, unspecified: Secondary | ICD-10-CM | POA: Diagnosis present

## 2011-12-02 DIAGNOSIS — R748 Abnormal levels of other serum enzymes: Secondary | ICD-10-CM

## 2011-12-02 DIAGNOSIS — K861 Other chronic pancreatitis: Secondary | ICD-10-CM | POA: Diagnosis present

## 2011-12-02 DIAGNOSIS — K831 Obstruction of bile duct: Secondary | ICD-10-CM | POA: Diagnosis present

## 2011-12-02 DIAGNOSIS — S0500XA Injury of conjunctiva and corneal abrasion without foreign body, unspecified eye, initial encounter: Secondary | ICD-10-CM | POA: Insufficient documentation

## 2011-12-02 DIAGNOSIS — I2510 Atherosclerotic heart disease of native coronary artery without angina pectoris: Secondary | ICD-10-CM | POA: Diagnosis present

## 2011-12-02 DIAGNOSIS — I10 Essential (primary) hypertension: Secondary | ICD-10-CM | POA: Diagnosis present

## 2011-12-02 DIAGNOSIS — E669 Obesity, unspecified: Secondary | ICD-10-CM | POA: Diagnosis present

## 2011-12-02 DIAGNOSIS — Z9861 Coronary angioplasty status: Secondary | ICD-10-CM

## 2011-12-02 HISTORY — DX: Atherosclerotic heart disease of native coronary artery without angina pectoris: I25.10

## 2011-12-02 LAB — DIFFERENTIAL
Basophils Absolute: 0 10*3/uL (ref 0.0–0.1)
Basophils Relative: 0 % (ref 0–1)
Eosinophils Relative: 4 % (ref 0–5)
Lymphocytes Relative: 38 % (ref 12–46)
Lymphs Abs: 3.6 10*3/uL (ref 0.7–4.0)
Monocytes Relative: 8 % (ref 3–12)
Neutro Abs: 4.7 10*3/uL (ref 1.7–7.7)

## 2011-12-02 LAB — BASIC METABOLIC PANEL
BUN: 10 mg/dL (ref 6–23)
CO2: 17 mEq/L — ABNORMAL LOW (ref 19–32)
Chloride: 108 mEq/L (ref 96–112)
GFR calc non Af Amer: >90 mL/min (ref >90)
Glucose, Bld: 85 mg/dL (ref 70–99)
Potassium: 3.8 mEq/L (ref 3.5–5.1)
Sodium: 139 mEq/L (ref 135–145)

## 2011-12-02 LAB — CBC
HCT: 41.1 % (ref 36.0–46.0)
Hemoglobin: 14 g/dL (ref 12.0–15.0)
MCV: 66.1 fL — ABNORMAL LOW (ref 78.0–100.0)
RBC: 6.22 MIL/uL — ABNORMAL HIGH (ref 3.87–5.11)
WBC: 9.5 10*3/uL (ref 4.0–10.5)

## 2011-12-02 LAB — APTT: aPTT: 37 seconds (ref 24–37)

## 2011-12-02 LAB — GLUCOSE, CAPILLARY: Glucose-Capillary: 84 mg/dL (ref 70–99)

## 2011-12-02 LAB — HEPATIC FUNCTION PANEL
AST: 83 U/L — ABNORMAL HIGH (ref 0–37)
Albumin: 3.4 g/dL — ABNORMAL LOW (ref 3.5–5.2)
Alkaline Phosphatase: 301 U/L — ABNORMAL HIGH (ref 39–117)
Total Protein: 7.4 g/dL (ref 6.0–8.3)

## 2011-12-02 LAB — PRO B NATRIURETIC PEPTIDE: Pro B Natriuretic peptide (BNP): 75.9 pg/mL (ref 0–125)

## 2011-12-02 MED ORDER — CLOPIDOGREL BISULFATE 75 MG PO TABS
75.0000 mg | ORAL_TABLET | Freq: Every day | ORAL | Status: DC
  Administered 2011-12-03 – 2011-12-06 (×4): 75 mg via ORAL
  Filled 2011-12-02 (×5): qty 1

## 2011-12-02 MED ORDER — IOHEXOL 300 MG/ML  SOLN
100.0000 mL | Freq: Once | INTRAMUSCULAR | Status: AC | PRN
  Administered 2011-12-02: 100 mL via INTRAVENOUS

## 2011-12-02 MED ORDER — SODIUM CHLORIDE 0.9 % IV SOLN
INTRAVENOUS | Status: DC
  Administered 2011-12-02: 125 mL/h via INTRAVENOUS

## 2011-12-02 MED ORDER — MORPHINE SULFATE 4 MG/ML IJ SOLN
4.0000 mg | Freq: Once | INTRAMUSCULAR | Status: AC
  Administered 2011-12-02: 4 mg via INTRAVENOUS
  Filled 2011-12-02: qty 1

## 2011-12-02 MED ORDER — HYDROMORPHONE HCL PF 1 MG/ML IJ SOLN
1.0000 mg | Freq: Once | INTRAMUSCULAR | Status: AC
  Administered 2011-12-02: 1 mg via INTRAVENOUS
  Filled 2011-12-02: qty 1

## 2011-12-02 MED ORDER — VITAMIN D-3 125 MCG (5000 UT) PO TABS: 1.0000 | ORAL_TABLET | ORAL | Status: DC

## 2011-12-02 MED ORDER — CLONAZEPAM 0.5 MG PO TABS
1.0000 mg | ORAL_TABLET | Freq: Every day | ORAL | Status: DC
  Administered 2011-12-02 – 2011-12-05 (×4): 1 mg via ORAL
  Filled 2011-12-02: qty 2
  Filled 2011-12-02: qty 1
  Filled 2011-12-02: qty 2
  Filled 2011-12-02 (×3): qty 1

## 2011-12-02 MED ORDER — ERYTHROMYCIN 5 MG/GM OP OINT: TOPICAL_OINTMENT | OPHTHALMIC | Status: AC

## 2011-12-02 MED ORDER — SODIUM CHLORIDE 0.9 % IV BOLUS (SEPSIS)
1000.0000 mL | Freq: Once | INTRAVENOUS | Status: AC
  Administered 2011-12-02: 1000 mL via INTRAVENOUS

## 2011-12-02 MED ORDER — INSULIN ASPART 100 UNIT/ML ~~LOC~~ SOLN: 0.0000 [IU] | Freq: Every day | SUBCUTANEOUS | Status: DC

## 2011-12-02 MED ORDER — DIPHENOXYLATE-ATROPINE 2.5-0.025 MG PO TABS: 1.0000 | ORAL_TABLET | Freq: Two times a day (BID) | ORAL | Status: DC | PRN

## 2011-12-02 MED ORDER — ACETAMINOPHEN 650 MG RE SUPP: 650.0000 mg | Freq: Four times a day (QID) | RECTAL | Status: DC | PRN

## 2011-12-02 MED ORDER — VITAMIN D3 25 MCG (1000 UNIT) PO TABS: 5000.0000 [IU] | ORAL_TABLET | ORAL | Status: DC

## 2011-12-02 MED ORDER — ENOXAPARIN SODIUM 40 MG/0.4ML ~~LOC~~ SOLN
40.0000 mg | SUBCUTANEOUS | Status: DC
  Administered 2011-12-02 – 2011-12-05 (×4): 40 mg via SUBCUTANEOUS
  Filled 2011-12-02 (×6): qty 0.4

## 2011-12-02 MED ORDER — ONDANSETRON HCL 4 MG PO TABS: 4.0000 mg | ORAL_TABLET | Freq: Four times a day (QID) | ORAL | Status: DC | PRN

## 2011-12-02 MED ORDER — ONDANSETRON HCL 4 MG/2ML IJ SOLN: 4.0000 mg | Freq: Four times a day (QID) | INTRAMUSCULAR | Status: DC | PRN

## 2011-12-02 MED ORDER — MIRTAZAPINE 7.5 MG PO TABS
7.5000 mg | ORAL_TABLET | Freq: Every day | ORAL | Status: DC
  Administered 2011-12-02 – 2011-12-05 (×4): 7.5 mg via ORAL
  Filled 2011-12-02 (×7): qty 1

## 2011-12-02 MED ORDER — ACETAMINOPHEN 325 MG PO TABS: 650.0000 mg | ORAL_TABLET | Freq: Four times a day (QID) | ORAL | Status: DC | PRN

## 2011-12-02 MED ORDER — FENTANYL CITRATE 0.05 MG/ML IJ SOLN
100.0000 ug | Freq: Once | INTRAMUSCULAR | Status: AC
  Administered 2011-12-02: 100 ug via INTRAVENOUS
  Filled 2011-12-02: qty 2

## 2011-12-02 MED ORDER — ONDANSETRON HCL 4 MG/2ML IJ SOLN
4.0000 mg | INTRAMUSCULAR | Status: DC | PRN
  Administered 2011-12-02: 4 mg via INTRAVENOUS
  Filled 2011-12-02: qty 2

## 2011-12-02 MED ORDER — MORPHINE SULFATE 2 MG/ML IJ SOLN
1.0000 mg | INTRAMUSCULAR | Status: DC | PRN
  Administered 2011-12-03 (×3): 1 mg via INTRAVENOUS
  Filled 2011-12-02 (×4): qty 1

## 2011-12-02 MED ORDER — MEGESTROL ACETATE 40 MG/ML PO SUSP
400.0000 mg | Freq: Every day | ORAL | Status: DC
  Administered 2011-12-03 – 2011-12-06 (×4): 400 mg via ORAL
  Filled 2011-12-02 (×6): qty 10

## 2011-12-02 MED ORDER — MORPHINE SULFATE 4 MG/ML IJ SOLN: 4.0000 mg | Freq: Once | INTRAMUSCULAR | Status: DC

## 2011-12-02 MED ORDER — OXYCODONE-ACETAMINOPHEN 5-325 MG PO TABS: 1.0000 | ORAL_TABLET | Freq: Four times a day (QID) | ORAL | Status: AC | PRN

## 2011-12-02 MED ORDER — INSULIN ASPART 100 UNIT/ML ~~LOC~~ SOLN
0.0000 [IU] | Freq: Three times a day (TID) | SUBCUTANEOUS | Status: DC
  Administered 2011-12-03 – 2011-12-04 (×2): 2 [IU] via SUBCUTANEOUS
  Administered 2011-12-05: 3 [IU] via SUBCUTANEOUS
  Administered 2011-12-05: 2 [IU] via SUBCUTANEOUS
  Filled 2011-12-02: qty 3

## 2011-12-02 MED ORDER — FAMOTIDINE 20 MG PO TABS
20.0000 mg | ORAL_TABLET | Freq: Two times a day (BID) | ORAL | Status: DC
  Administered 2011-12-02 – 2011-12-06 (×8): 20 mg via ORAL
  Filled 2011-12-02 (×9): qty 1

## 2011-12-02 MED ORDER — DEXTROSE-NACL 5-0.45 % IV SOLN: INTRAVENOUS | Status: DC

## 2011-12-02 MED ORDER — PANCRELIPASE (LIP-PROT-AMYL) 12000-38000 UNITS PO CPEP
1.0000 | ORAL_CAPSULE | Freq: Three times a day (TID) | ORAL | Status: DC
  Administered 2011-12-03 (×3): 1 via ORAL
  Filled 2011-12-02 (×7): qty 1

## 2011-12-02 NOTE — ED Notes (Signed)
Labs obtained

## 2011-12-02 NOTE — ED Provider Notes (Signed)
Medical screening examination/treatment/procedure(s) were conducted as a shared visit with non-physician practitioner(s) and myself.  I personally evaluated the patient during the encounter   Stephanine Reas, MD 12/02/11 1706 

## 2011-12-02 NOTE — ED Notes (Signed)
Pt here with c/o abd pain and swelling that woke her at 0300. States pain off and on for the past week. Pt states history of pancreatitis.

## 2011-12-02 NOTE — ED Notes (Signed)
Abdominal pain and swelling for over 1 week, intermittent n/v

## 2011-12-02 NOTE — ED Provider Notes (Signed)
History     CSN: [id number]  Arrival date & time 12/02/11  1047   First MD Initiated Contact with Patient 12/02/11 1159      Chief Complaint  Patient presents with  . Abdominal Pain    (Consider location/radiation/quality/duration/timing/severity/associated sxs/prior treatment) HPI Comments: Patient with history of chronic pancreatitis, alcohol abuse, coronary artery disease, and diabetes Presents emergency Department with chief complaint of abdominal pain, nausea, vomiting, diarrhea and abdominal fullness.   Patient is a 66 y.o. female presenting with abdominal pain. The history is provided by the patient.  Abdominal Pain The primary symptoms of the illness include abdominal pain, nausea, vomiting and diarrhea. The primary symptoms of the illness do not include fever, fatigue, shortness of breath, hematemesis, hematochezia, dysuria, vaginal discharge or vaginal bleeding. Episode onset: onset 1 week ago  The onset of the illness was gradual. The problem has been rapidly worsening (at 3:00 AM pain woke the pt up and was severe).  The pain came on gradually. The abdominal pain has been gradually worsening since its onset. The abdominal pain is located in the epigastric region. The abdominal pain does not radiate. The severity of the abdominal pain is 10/10. The abdominal pain is relieved by nothing (Pt has been taking her Pancreaze because she thought it was pancreatic pain). The abdominal pain is exacerbated by vomiting.  Nausea began 3 to 5 days ago. The nausea is exacerbated by food, motion, activity and stress.  Onset: 3-4 days ago  Frequency: 1-2 times a day  Vomiting appearance: watery   The diarrhea began 2 days ago. The diarrhea is watery. The diarrhea occurs 2 to 4 times per day.   The illness is associated with alcohol use (pt states no alc use for 3 years). The patient states that she believes she is currently not pregnant. The patient has had a change in bowel habit. Risk factors  for an acute abdominal problem include being elderly and a history of abdominal surgery (cholestectomy ). Symptoms associated with the illness do not include chills, anorexia, diaphoresis, heartburn, constipation, urgency, hematuria, frequency or back pain. Significant associated medical issues include diabetes and cardiac disease. Significant associated medical issues do not include inflammatory bowel disease, gallstones or substance abuse. Associated medical issues comments: Chronic Pancreatitis, Coronary stent .    Past Medical History  Diagnosis Date  . Diabetes mellitus   . Pancreatitis   . Coronary artery disease   . Hypertension   . Coronary stent occlusion     Past Surgical History  Procedure Date  . Cholecystectomy     No family history on file.  History  Substance Use Topics  . Smoking status: Current Everyday Smoker  . Smokeless tobacco: Not on file  . Alcohol Use: No    OB History    Grav Para Term Preterm Abortions TAB SAB Ect Mult Living                  Review of Systems  Constitutional: Negative for fever, chills, diaphoresis and fatigue.  Respiratory: Negative for shortness of breath.   Gastrointestinal: Positive for nausea, vomiting, abdominal pain and diarrhea. Negative for heartburn, constipation, hematochezia, anorexia and hematemesis.  Genitourinary: Negative for dysuria, urgency, frequency, hematuria, vaginal bleeding and vaginal discharge.  Musculoskeletal: Negative for back pain.    Allergies  Diclofenac sodium  Home Medications   Current Outpatient Rx  Name Route Sig Dispense Refill  . VITAMIN D-3 5000 UNITS PO TABS Oral Take 1 tablet by mouth once  a week.      . CLONAZEPAM 1 MG PO TABS Oral Take 1 mg by mouth at bedtime.      . CLOPIDOGREL BISULFATE 75 MG PO TABS Oral Take 75 mg by mouth daily.      Marland Kitchen FAMOTIDINE 20 MG PO TABS Oral Take 20 mg by mouth 2 (two) times daily.      . MEGESTROL ACETATE 40 MG/ML PO SUSP  (2 TEASPOONSFUL) BY  MOUTH DAILY 480 mL 3  . METFORMIN HCL 500 MG PO TABS Oral Take 500 mg by mouth daily.      Marland Kitchen MIRTAZAPINE 15 MG PO TABS Oral Take 7.5 mg by mouth at bedtime.      Marland Kitchen PANCREAZE 21308 UNITS PO CPEP  TAKE ONE (1) CAPSULE THREE (3) TIMES    EACH DAY WITH MEALS 90 capsule 5  . DIPHENOXYLATE-ATROPINE 2.5-0.025 MG PO TABS Oral Take 1-2 tablets by mouth 2 (two) times daily as needed. For diarrhea       BP 128/80  Pulse 90  Temp(Src) 98.3 F (36.8 C) (Oral)  Resp 16  Ht 5\' 2"  (1.575 m)  Wt 177 lb (80.287 kg)  BMI 32.37 kg/m2  SpO2 98%  Physical Exam  Nursing note and vitals reviewed. Constitutional: Vital signs are normal. She appears well-developed and well-nourished. No distress.  HENT:  Head: Normocephalic and atraumatic.  Mouth/Throat: Uvula is midline, oropharynx is clear and moist and mucous membranes are normal.  Eyes: Conjunctivae and EOM are normal. Pupils are equal, round, and reactive to light.  Neck: Normal range of motion and full passive range of motion without pain. Neck supple. No spinous process tenderness and no muscular tenderness present. No rigidity. No Brudzinski's sign noted.  Cardiovascular: Normal rate and regular rhythm.   Pulmonary/Chest: Effort normal and breath sounds normal. No accessory muscle usage. Not tachypneic. No respiratory distress.  Abdominal: Soft. Normal appearance. She exhibits distension and ascites. She exhibits no pulsatile midline mass and no mass. There is tenderness in the epigastric area. There is no CVA tenderness. No hernia.       cholecystectomy scar  Lymphadenopathy:    She has no cervical adenopathy.  Neurological: She is alert.  Skin: Skin is warm and dry. No rash noted. She is not diaphoretic.  Psychiatric: She has a normal mood and affect. Her speech is normal and behavior is normal.    ED Course  Procedures (including critical care time)  Labs Reviewed  CBC - Abnormal; Notable for the following:    RBC 6.22 (*)    MCV 66.1 (*)     MCH 22.5 (*)    RDW 18.6 (*)    Platelets 451 (*)    All other components within normal limits  BASIC METABOLIC PANEL - Abnormal; Notable for the following:    CO2 17 (*)    All other components within normal limits  LIPASE, BLOOD - Abnormal; Notable for the following:    Lipase 6 (*)    All other components within normal limits  HEPATIC FUNCTION PANEL - Abnormal; Notable for the following:    Albumin 3.4 (*)    AST 83 (*)    ALT 86 (*)    Alkaline Phosphatase 301 (*)    All other components within normal limits  DIFFERENTIAL   No results found.  CT IMPRESSION:  1. Changes of chronic pancreatitis. 2. Interval increase in caliber of the common bile duct compared with 01/22/2010. In the region of the distal CBD  there is area of increased attenuation which may represent prominent ampulla, choledocholithiasis or a small tumor. If clinically indicated, further evaluation with MRCP or ERCP and possible tissue sampling if is suggested. 3. Fatty infiltration of the liver.     No diagnosis found.    MDM  Elevated LFTs, Abdominal pain, N/V/D  Not likely acute pancreatitis d/t lipase of 6. Pt being admitted for pain management and further evaluation of CT findings. Consult to GI for possible ERCP (even though pt has had a cholecysectomy)  Case discussed with Dr. Preston Fleeting who agrees with this plan.      Jaci Carrel, PA-C 12/02/11 1500

## 2011-12-02 NOTE — ED Notes (Signed)
resting quietly on stretcher watching TV - appears comfortable at this time

## 2011-12-02 NOTE — ED Notes (Signed)
Admitting at bedside 

## 2011-12-02 NOTE — H&P (Addendum)
[name] Foley is an 66 y.o. female.   Family Medicine Teaching Service History and Physical Service Pager: [phone]   Chief Complaint: Abdominal pain, N/V HPI: Patient is a 66 yo F with PMH of chronic pancreatitis, DM and HTN who presented to the ED today for 1 week history of worsening abdominal pain associated with nausea and vomiting. Pain was worst at 3:00am today and decided to be evaluated today. She took her "pancrease pill" this morning, which did not help, but she has not tried anything else for her pain. Pain is in epigastric area and does not radiate but she does have sharp pain in back as well. She has had nausea and vomiting for multiple days. She last vomited this morning after waking up. Emesis was watery; no bile or bloody. Patient also endorses diarrhea, including today. (Non-bloody) She denies fevers and chills. She has not had any CP, SOB, or sweats. She does have decreased appetite but she reports that she has been drinking plenty of fluids. Last time she ate solid food was approx 3 week. She has had a gradual weight loss over many years. She is on Megace for appetite but has had more weight loss lately. Patient has history of pancreatitis. Last flare was in 2009. She is on chronic medications for her pancreatitis. Patient smokes 1/2 ppd (which is less than she used to smoke.) Has not had any alcohol in 4 years. She has never seen a GI specialist.   ED Course: Patient requiring Dilaudid for pain which states is the only thing that has made her pain better. Her Lipase was 6 today, mildly elevated AST/ALT/Alk Phos. Patient had CT scan which shows CBD stone vs tumor. Admitted for pain control and ERCP/MRCP.  Past Medical History  Diagnosis Date  . Diabetes mellitus   . Pancreatitis   . Coronary artery disease   . Hypertension   . Coronary stent occlusion 2006    Past Surgical History  Procedure Date  . Cholecystectomy 1976  Hysterectomy for fibroid tumors  Family History:  No significant family history other than DM and HTN. No cancer.  Social History:  Smokes 1/2 ppd. Lives alone. No alcohol or illicit drug use  Allergies:  Allergies  Allergen Reactions  . Diclofenac Sodium     REACTION: Chronic renal insufficiency.    Medications Prior to Admission  Medication Dose Route Frequency Provider Last Rate Last Dose  . fentaNYL (SUBLIMAZE) injection 100 mcg  100 mcg Intravenous Once Nelia Shi, MD   100 mcg at 12/02/11 1740  . HYDROmorphone (DILAUDID) injection 1 mg  1 mg Intravenous Once Lisette Paz, PA-C   1 mg at 12/02/11 1516  . iohexol (OMNIPAQUE) 300 MG/ML solution 100 mL  100 mL Intravenous Once PRN Medication Radiologist, MD   100 mL at 12/02/11 1347  . morphine 4 MG/ML injection 4 mg  4 mg Intravenous Once [employer], PA-C   4 mg at 12/02/11 1253  . morphine 4 MG/ML injection 4 mg  4 mg Intravenous Once Lisette Paz, PA-C   4 mg at 12/02/11 1417  . morphine 4 MG/ML injection 4 mg  4 mg Intravenous Once Dione Booze, MD      . ondansetron [facility]) injection 4 mg  4 mg Intravenous PRN Lisette Paz, PA-C   4 mg at 12/02/11 1253  . sodium chloride 0.9 % bolus 1,000 mL  1,000 mL Intravenous Once Lisette Paz, PA-C   1,000 mL at 12/02/11 1207   Medications Prior  to Admission  Medication Sig Dispense Refill  . Cholecalciferol (VITAMIN D-3) 5000 UNIT TABS Take 1 tablet by mouth once a week.        . clonazePAM (KLONOPIN) 1 MG tablet Take 1 mg by mouth at bedtime.        . clopidogrel (PLAVIX) 75 MG tablet Take 75 mg by mouth daily.        . famotidine (PEPCID) 20 MG tablet Take 20 mg by mouth 2 (two) times daily.        . megestrol (MEGACE) 40 MG/ML suspension (2 TEASPOONSFUL) BY MOUTH DAILY  480 mL  3  . metFORMIN (GLUCOPHAGE) 500 MG tablet Take 500 mg by mouth daily.        . mirtazapine (REMERON) 15 MG tablet Take 7.5 mg by mouth at bedtime.        Marland Kitchen PANCREAZE 16109 UNITS CPEP TAKE ONE (1) CAPSULE THREE (3) TIMES    EACH DAY WITH MEALS  90 capsule   5  . diphenoxylate-atropine (LOMOTIL) 2.5-0.025 MG per tablet Take 1-2 tablets by mouth 2 (two) times daily as needed. For diarrhea         Results for orders placed during the hospital encounter of 12/02/11 (from the past 48 hour(s))  CBC     Status: Abnormal   Collection Time   12/02/11 11:39 AM      Component Value Range Comment   WBC 9.5  4.0 - 10.5 (K/uL)    RBC 6.22 (*) 3.87 - 5.11 (MIL/uL)    Hemoglobin 14.0  12.0 - 15.0 (g/dL)    HCT 60.4  54.0 - 98.1 (%)    MCV 66.1 (*) 78.0 - 100.0 (fL)    MCH 22.5 (*) 26.0 - 34.0 (pg)    MCHC 34.1  30.0 - 36.0 (g/dL)    RDW 19.1 (*) 47.8 - 15.5 (%)    Platelets 451 (*) 150 - 400 (K/uL)   DIFFERENTIAL     Status: Normal   Collection Time   12/02/11 11:39 AM      Component Value Range Comment   Neutrophils Relative 50  43 - 77 (%)    Lymphocytes Relative 38  12 - 46 (%)    Monocytes Relative 8  3 - 12 (%)    Eosinophils Relative 4  0 - 5 (%)    Basophils Relative 0  0 - 1 (%)    Neutro Abs 4.7  1.7 - 7.7 (K/uL)    Lymphs Abs 3.6  0.7 - 4.0 (K/uL)    Monocytes Absolute 0.8  0.1 - 1.0 (K/uL)    Eosinophils Absolute 0.4  0.0 - 0.7 (K/uL)    Basophils Absolute 0.0  0.0 - 0.1 (K/uL)    RBC Morphology TARGET CELLS      Smear Review LARGE PLATELETS PRESENT     BASIC METABOLIC PANEL     Status: Abnormal   Collection Time   12/02/11 11:39 AM      Component Value Range Comment   Sodium 139  135 - 145 (mEq/L)    Potassium 3.8  3.5 - 5.1 (mEq/L)    Chloride 108  96 - 112 (mEq/L)    CO2 17 (*) 19 - 32 (mEq/L)    Glucose, Bld 85  70 - 99 (mg/dL)    BUN 10  6 - 23 (mg/dL)    Creatinine, Ser 2.95  0.50 - 1.10 (mg/dL)    Calcium 9.8  8.4 - 10.5 (mg/dL)  GFR calc non Af Amer >90  >90 (mL/min)    GFR calc Af Amer >90  >90 (mL/min)   LIPASE, BLOOD     Status: Abnormal   Collection Time   12/02/11 11:39 AM      Component Value Range Comment   Lipase 6 (*) 11 - 59 (U/L)   HEPATIC FUNCTION PANEL     Status: Abnormal   Collection Time   12/02/11 11:39  AM      Component Value Range Comment   Total Protein 7.4  6.0 - 8.3 (g/dL)    Albumin 3.4 (*) 3.5 - 5.2 (g/dL)    AST 83 (*) 0 - 37 (U/L)    ALT 86 (*) 0 - 35 (U/L)    Alkaline Phosphatase 301 (*) 39 - 117 (U/L)    Total Bilirubin 0.6  0.3 - 1.2 (mg/dL)    Bilirubin, Direct 0.3  0.0 - 0.3 (mg/dL)    Indirect Bilirubin 0.3  0.3 - 0.9 (mg/dL)   GLUCOSE, CAPILLARY     Status: Normal   Collection Time   12/02/11  6:39 PM      Component Value Range Comment   Glucose-Capillary 84  70 - 99 (mg/dL)    Comment 1 Notify RN      Ct Abdomen Pelvis W Contrast  12/02/2011  *RADIOLOGY REPORT*  Clinical Data: Abdominal pain  CT ABDOMEN AND PELVIS WITH CONTRAST  Technique:  Multidetector CT imaging of the abdomen and pelvis was performed following the standard protocol during bolus administration of intravenous contrast.  Contrast: OMNIPAQUE IOHEXOL 300 MG/ML IV SOLN  Comparison: 01/22/2010  Findings: No pericardial or pleural effusion identified.  The lung bases appear clear.  Diffuse fatty infiltration of the liver parenchyma identified.  Prior cholecystectomy.  There is diffuse pancreatic calcifications consistent with chronic pancreatitis.  The common bile duct measures 1.3 cm.  This has increased from 0.7 cm previously. Within the distal common bile ducts there is a focal area of increased density measuring 1.3 cm.  This is indeterminate.  Spleen is normal.  The adrenal glands are normal.  Normal appearance of the left kidney.  Similar appearance of cyst within the upper pole of the right kidney.  Scattered upper abdominal lymph nodes are noted.  No adenopathy however.  There is no pelvic or inguinal adenopathy.  Urinary bladder appears normal.  A ventral abdominal wall hernia contains fat only.  The patient has a small hiatal hernia.  The stomach is within normal limits.  The small bowel loops have a normal course and caliber.  Normal appearance of the colon.  There is no free fluid or fluid collections  within the abdomen or pelvis.  Review of the visualized osseous structures is significant for mild spondylosis.  IMPRESSION:  1.  Changes of chronic pancreatitis. 2.  Interval increase in caliber of the common bile duct compared with 01/22/2010.  In the region of the distal CBD there is  area of increased attenuation which may represent prominent ampulla, choledocholithiasis or a small tumor. If clinically indicated, further evaluation with MRCP or ERCP and possible tissue sampling if is suggested. 3.  Fatty infiltration of the liver.  Original Report Authenticated By: Rosealee Albee, M.D.   ROS See HPI above  Blood pressure 123/58, pulse 82, temperature 98.3 F (36.8 C), temperature source Oral, resp. rate 18, height 5\' 2"  (1.575 m), weight 177 lb (80.287 kg), SpO2 96.00%. Physical Exam  Constitutional: She appears well-developed. She appears distressed (Mildly  distressed. Appears uncomfortable.).  HENT:  Head: Normocephalic and atraumatic.  Mouth/Throat: Mucous membranes are dry.  Eyes: EOM are normal. No scleral icterus.  Neck: Hepatojugular reflux (small fluid wave) present.  Cardiovascular: Normal rate, regular rhythm and normal heart sounds.   No murmur heard. Respiratory: Effort normal.       Bibasilar crackles R>L  GI: She exhibits distension. There is tenderness in the right upper quadrant and epigastric area.  Musculoskeletal: She exhibits no edema and no tenderness.  Neurological: She is alert. She has normal strength. No cranial nerve deficit or sensory deficit.  Skin: Skin is warm and dry.     Assessment/Plan 66 yo F presenting with abdominal pain, N/V 1. Abdominal pain- Patient has chronic pancreatitis, but her lipase was 6 on admission. Patient had abd CT that showed stone vs. Tumor in the CBD which could be causing her pain. She is s/p cholecystomy 30+ years ago.  - Will admit to [facility]PTS for pain control and further work up - Given the CBD occlusion, will consult GI for  evaluation and MRCP vs ERCP - Will keep the patient NPO for now. Can advance diet at recommendation of GI. - IVF D51/2NS @ 125cc/hr since she appears clinically dry and states she has had decreased PO intake - Patient has elevated liver function. This could be secondary to fatty liver seen on CT or the common bile duct abnormality - Recheck Cmet and CBC in the AM - Will treat pain with Morphine 1mg  q 2 hours prn, but this can be increased if needed.   2. Bibasilar crackles- No history of CHF or lung disease. She had pneumonia in 2011, but no illnesses since then. She denies SOB or cough. She is afebrile and WBC is within normal limits. - Will get CXR now - Will also get Pro-BNP (there is a baseline in 2009 for comparison)  3. S/P Stent Placement- 2006 - Continue home Plavix - Monitor BP since she has a history of HTN but is not on any home medications  4. DM- History of DM, takes Metformin at home - Glucose 85 on admission. - Will hold Metformin since she had CT scan and will likely need ERCP or MRCP - Will give SSI for coverage - NPO for now, but on D51/2 NS  5. Tobacco abuse- Patient smokes 1/2 ppd.  - Will get tobacco cessation counseling - Will order nicotine patch, if patient requests it  6. FEN/GI- NPO. D51/2NS @ 125cc/hr 7. Ppx- Lovenox 8. Dispo- Pending overall clinical improvement as well as GI consult.  HAIRFORD, AMBER 12/02/2011, 6:51 PM  PGY-2 ADDENDUM:  I have seen and examined patient with Dr. Mikel Cella and I agree with her above findings and assessment.  I have also discussed plan with Dr. Mikel Cella and have reviewed her H&P and made the above changes.  DE LA CRUZ,Sonam Huelsmann   [facility] Attending Admit Note  Patient seen and examined in the ED, discussed with Dr Mikel Cella and I agree with her assessment and plan as outlined above.  Briefly, a 66 yo F with prior history of pancreatitis secondary to alcohol use, who presents today with a 10 day history of abdominal pain and  intermittent N/V,which worsened this morning.  Not associated with fevers or chills.  She reports no alcohol use in 4 years (last episode of pancreatitis was in January 2009).  She is s/p CCY in 1973.  Assess/Plan: Patient with epigastric pain, N/V and finding of distended CBD on CT scan; mildly elevated  transaminases; elevated Alkaline phosphatase.  On exam, her abdomen is soft, with some epigastric tenderness but no appreciable organomegaly.  She does have fine crackles on lung exam, heard more prominently on the R lung base than the L.  There are no findings on the CT abdomen at lung bases that explain this.  Plan for admission; to speak with GI about ERCP for evaluation and possible tissue retrieval from CBD.  CXR for finding of basilar crackles.    Paula Compton, MD

## 2011-12-02 NOTE — ED Provider Notes (Addendum)
66 year old female the history of pancreatitis comes in with a one-week history of upper about no pain, nausea, vomiting, and diarrhea. She states the pain is typical of her pancreatitis. She denies alcohol consumption since her original diagnosis of pancreatitis. On exam, she is tender in the epigastrium and right upper quadrant without any rebound or guarding. Peristalsis is diminished. Cholecystectomy scars present well-healed. Her laboratory workup thus far shows a normal lipase. Pain is probably not due to pancreatitis since she has shown in recent ED visits the ability to elevate her lipase in cases of acute pancreatitis. CT has been ordered and is pending.  [name] Booze, MD 12/02/11 1353  She is continuing to require significant amounts of narcotics for pain control. CT shows questionable mass or stone in the distal common bile duct. Transaminases and alkaline phosphatase are up significantly over her last values. Case is discussed with family practice Center and arrangements are made for admission.  [name] Booze, MD 12/02/11 [phone]

## 2011-12-03 ENCOUNTER — Inpatient Hospital Stay (HOSPITAL_COMMUNITY): Payer: Medicaid Other

## 2011-12-03 ENCOUNTER — Other Ambulatory Visit: Payer: Self-pay

## 2011-12-03 DIAGNOSIS — K831 Obstruction of bile duct: Secondary | ICD-10-CM | POA: Diagnosis present

## 2011-12-03 LAB — GLUCOSE, CAPILLARY
Glucose-Capillary: 93 mg/dL (ref 70–99)
Glucose-Capillary: 115 mg/dL — ABNORMAL HIGH (ref 70–99)
Glucose-Capillary: 139 mg/dL — ABNORMAL HIGH (ref 70–99)

## 2011-12-03 LAB — HEMOGLOBIN A1C: Hgb A1c MFr Bld: 6.2 % — ABNORMAL HIGH (ref <5.7)

## 2011-12-03 LAB — COMPREHENSIVE METABOLIC PANEL
AST: 85 U/L — ABNORMAL HIGH (ref 0–37)
Albumin: 2.8 g/dL — ABNORMAL LOW (ref 3.5–5.2)
Alkaline Phosphatase: 309 U/L — ABNORMAL HIGH (ref 39–117)
BUN: 5 mg/dL — ABNORMAL LOW (ref 6–23)
Chloride: 106 mEq/L (ref 96–112)
Potassium: 3.2 mEq/L — ABNORMAL LOW (ref 3.5–5.1)
Total Bilirubin: 0.9 mg/dL (ref 0.3–1.2)
Total Protein: 6.5 g/dL (ref 6.0–8.3)

## 2011-12-03 LAB — CBC
HCT: 36.9 % (ref 36.0–46.0)
MCH: 21.9 pg — ABNORMAL LOW (ref 26.0–34.0)
MCV: 65.8 fL — ABNORMAL LOW (ref 78.0–100.0)
Platelets: 382 10*3/uL (ref 150–400)
RBC: 5.61 MIL/uL — ABNORMAL HIGH (ref 3.87–5.11)
WBC: 8.9 10*3/uL (ref 4.0–10.5)

## 2011-12-03 MED ORDER — GADOBENATE DIMEGLUMINE 529 MG/ML IV SOLN
20.0000 mL | Freq: Once | INTRAVENOUS | Status: AC | PRN
  Administered 2011-12-03: 20 mL via INTRAVENOUS

## 2011-12-03 MED ORDER — DEXTROSE 5 % IV SOLN
1.0000 g | INTRAVENOUS | Status: DC
  Administered 2011-12-03 – 2011-12-05 (×3): 1 g via INTRAVENOUS
  Filled 2011-12-03 (×4): qty 10

## 2011-12-03 MED ORDER — POTASSIUM CHLORIDE CRYS ER 20 MEQ PO TBCR
40.0000 meq | EXTENDED_RELEASE_TABLET | Freq: Two times a day (BID) | ORAL | Status: AC
  Administered 2011-12-03 (×2): 40 meq via ORAL
  Filled 2011-12-03: qty 2

## 2011-12-03 MED ORDER — POTASSIUM CHLORIDE CRYS ER 20 MEQ PO TBCR
EXTENDED_RELEASE_TABLET | ORAL | Status: AC
  Administered 2011-12-03: 40 meq via ORAL
  Filled 2011-12-03: qty 2

## 2011-12-03 MED ORDER — DEXTROSE 5 % IV SOLN
500.0000 mg | INTRAVENOUS | Status: DC
  Administered 2011-12-03 – 2011-12-05 (×3): 500 mg via INTRAVENOUS
  Filled 2011-12-03 (×4): qty 500

## 2011-12-03 MED ORDER — MORPHINE SULFATE 2 MG/ML IJ SOLN
2.0000 mg | INTRAMUSCULAR | Status: DC | PRN
  Administered 2011-12-03 – 2011-12-06 (×9): 2 mg via INTRAVENOUS
  Filled 2011-12-03 (×4): qty 1
  Filled 2011-12-03: qty 2
  Filled 2011-12-03 (×5): qty 1

## 2011-12-03 NOTE — Progress Notes (Signed)
Subjective: Still having 8/10 abdominal pain, with morphine decreasing pain to 7/10. No nausea, no vomiting. Has been tolerating juice but not able to eat. Has had non bloody watery stools (2 this morning). Normal urine output.  Had an episode of elevated blood pressure overnight. IV fluids were stopped with good blood pressure response.   Objective: Vital signs in last 24 hours: Temp:  [97.3 F (36.3 C)-99 F (37.2 C)] 99 F (37.2 C) (02/09 0345) Pulse Rate:  [81-109] 94  (02/09 0345) Resp:  [16-20] 20  (02/09 0345) BP: (123-184)/(58-115) 139/93 mmHg (02/09 0345) SpO2:  [93 %-98 %] 97 % (02/09 0345) Weight:  [177 lb (80.287 kg)] 177 lb (80.287 kg) (02/09 0500) Weight change:  Last BM Date: 12/02/11  Intake/Output from previous day:   Intake/Output this shift: not recorded    General appearance: alert, cooperative and mild distress Resp: normal air movement, occasional crackles, normal work of breathing Cardio: regular rate and rhythm, S1, S2 normal, no murmur, click, rub or gallop GI: soft, hypoactive bowel sounds, tender in right upper quadrant and epigastric area, no rebound, no guarding Extremities: extremities normal, atraumatic, no cyanosis or edema Skin: warm and dry  Lab Results:  Basename 12/03/11 0700 12/02/11 1139  WBC 8.9 9.5  HGB 12.3 14.0  HCT 36.9 41.1  PLT 382 451*   BMET  Basename 12/03/11 0700 12/02/11 1139  NA 135 139  K 3.2* 3.8  CL 106 108  CO2 18* 17*  GLUCOSE 91 85  BUN 5* 10  CREATININE 0.50 0.65  CALCIUM 8.7 9.8    Studies/Results: X-ray Chest Pa And Lateral   12/02/2011  *RADIOLOGY REPORT*  Clinical Data: Abdominal pain, crackles on exam  CHEST - 2 VIEW  Comparison: CT chest dated 01/22/2010  Findings: Mild right lower lobe opacity, suspicious for pneumonia. Possible mild left basilar atelectasis.  No pleural effusion or pneumothorax.  Cardiomediastinal silhouette is within normal limits.  Mild degenerative changes of the visualized  thoracolumbar spine.  Cholecystectomy clips.  IMPRESSION: Mild right lower lobe opacity, suspicious for pneumonia.  Original Report Authenticated By: Charline Bills, M.D.   Ct Abdomen Pelvis W Contrast  12/02/2011  *RADIOLOGY REPORT*  Clinical Data: Abdominal pain  CT ABDOMEN AND PELVIS WITH CONTRAST  Technique:  Multidetector CT imaging of the abdomen and pelvis was performed following the standard protocol during bolus administration of intravenous contrast.  Contrast: OMNIPAQUE IOHEXOL 300 MG/ML IV SOLN  Comparison: 01/22/2010  Findings: No pericardial or pleural effusion identified.  The lung bases appear clear.  Diffuse fatty infiltration of the liver parenchyma identified.  Prior cholecystectomy.  There is diffuse pancreatic calcifications consistent with chronic pancreatitis.  The common bile duct measures 1.3 cm.  This has increased from 0.7 cm previously. Within the distal common bile ducts there is a focal area of increased density measuring 1.3 cm.  This is indeterminate.  Spleen is normal.  The adrenal glands are normal.  Normal appearance of the left kidney.  Similar appearance of cyst within the upper pole of the right kidney.  Scattered upper abdominal lymph nodes are noted.  No adenopathy however.  There is no pelvic or inguinal adenopathy.  Urinary bladder appears normal.  A ventral abdominal wall hernia contains fat only.  The patient has a small hiatal hernia.  The stomach is within normal limits.  The small bowel loops have a normal course and caliber.  Normal appearance of the colon.  There is no free fluid or fluid collections within  the abdomen or pelvis.  Review of the visualized osseous structures is significant for mild spondylosis.  IMPRESSION:  1.  Changes of chronic pancreatitis. 2.  Interval increase in caliber of the common bile duct compared with 01/22/2010.  In the region of the distal CBD there is  area of increased attenuation which may represent prominent ampulla,  choledocholithiasis or a small tumor. If clinically indicated, further evaluation with MRCP or ERCP and possible tissue sampling if is suggested. 3.  Fatty infiltration of the liver.  Original Report Authenticated By: Rosealee Albee, M.D.    Medications: I have reviewed the patient's current medications.  Assessment/Plan: 66 yo female with history of chronic pancreatitis who presented with nausea, vomiting and abdominal pain and was found to have a normal lipase and an abdominal CT showing increased size of CBD suggestive of stone vs tumor 1. Abdominal pain: - currently on morphine 1mg  q2hr/prn, not well controlled. Will increase to 2mg  q2hr/prn for better pain control - patient to have MRCP today to further evaluate obstruction.  - follow up on GI's recommendations   2. Crackles on exam: BNP was normal and chest xray showed possible right lower lobe pneumonia. Patient has been afebrile and not showing any evidence of respiratory distress or hypoxia. Consider starting antibiotics if patient develops fever or changes clinically  3. Hypertension: - patient found to be hypertensive overnight. Blood pressure improved once fluids were stopped. Patient mentions that she is not currently on any blood pressure medication and that her blood pressure is normal at her PCP office visits. Was on blood pressure meds in the past but was taken off of them after she had a significant weight loss secondary to hospitalization due to PNA in 2011. - will hold IV fluids for now but keep monitoring fluid status especially since patient is not eating or drinking as much  4. DM: A1C; 6.2 - on insulin sliding scale - monitor hyperglycemia  5. Fen/Gi: - advance diet as tolerated, per GI recs  6. Dispo: - pending improvement    LOS: 1 day   Quetzali Heinle 12/03/2011, 8:44 AM

## 2011-12-03 NOTE — Progress Notes (Signed)
FMTS Attending Note  Patient seen and examined by me today, she reports feeling somewhat better, still with epigastric pain.  No nausea or vomiting, wants to eat.  On exam yesterday she had crackles in the R lower lung field; a chest x-ray finds radiographic evidence of infiltrate in RLL.  She remains without fevers or leukocytosis.   Assess/Plan: Patient admitted with epigastric abdominal pain and CBD dilatation; elevated alk phos and transaminases but with normal lipase on admission.  Additional findings support RLL pneumonia.  Patient is to go for MRCP today; GI consult for further evaluation. To begin treatment for community-acquired pneumonia with ceftriaxone and azithromycin.   Paula Compton, MD

## 2011-12-03 NOTE — Consult Note (Signed)
[facility] Consultation  Referring Provider: Dr. Raylene Miyamoto Primary Care Physician:  Dorrene German, MD, MD Primary Gastroenterologist:  Dr. Leone Payor (2011)  Reason for Consultation:  abd pain, abnl imaging  HPI: [name] Foley is a 66 y.o. female with PMH of chronic pancreatitis, hypertension, coronary artery disease, diabetes who is seen in consultation for worsening abdominal pain associated with nausea and vomiting.  The patient has a history of chronic pancreatitis felt secondary to alcohol abuse, but over the past 6 or 7 days she's developed worsening epigastric abdominal pain. She reports this pain does radiate into her back. She has had very decreased appetite and when trying to eat the pain worsens. She reports nausea with nonbloody/nonbilious emesis. She reports ongoing issues with loose stools. She does take pancreatic enzyme replacement once before each meal.  Her loose stools have been nonbloody and she denies melena. No fevers or chills. No further alcohol use. She does admit to heavy alcohol use but stopped about 3 years ago. She does continue to smoke tobacco one half pack per day. No cp, dyspnea, + mild cough.   Past Medical History  Diagnosis Date  . Diabetes mellitus   . Pancreatitis   . Coronary artery disease   . Hypertension   . Coronary stent occlusion   -Chronic pancreatitis  Past Surgical History  Procedure Date  . Cholecystectomy     Prior to Admission medications   Medication Sig Start Date End Date Taking? Authorizing Provider  Cholecalciferol (VITAMIN D-3) 5000 UNIT TABS Take 1 tablet by mouth once a week.     Yes Historical Provider, MD  clonazePAM (KLONOPIN) 1 MG tablet Take 1 mg by mouth at bedtime.     Yes Historical Provider, MD  clopidogrel (PLAVIX) 75 MG tablet Take 75 mg by mouth daily.     Yes Historical Provider, MD  famotidine (PEPCID) 20 MG tablet Take 20 mg by mouth 2 (two) times daily.     Yes Historical Provider, MD  megestrol  (MEGACE) 40 MG/ML suspension (2 TEASPOONSFUL) BY MOUTH DAILY 02/14/11  Yes Rodney Langton, MD  metFORMIN (GLUCOPHAGE) 500 MG tablet Take 500 mg by mouth daily.     Yes Historical Provider, MD  mirtazapine (REMERON) 15 MG tablet Take 7.5 mg by mouth at bedtime.     Yes Historical Provider, MD  PANCREAZE 16109 UNITS CPEP TAKE ONE (1) CAPSULE THREE (3) TIMES    EACH DAY WITH MEALS 10/20/11  Yes Ivy Tye Savoy, MD  diphenoxylate-atropine (LOMOTIL) 2.5-0.025 MG per tablet Take 1-2 tablets by mouth 2 (two) times daily as needed. For diarrhea     Historical Provider, MD  erythromycin ophthalmic ointment Place a 1/2 inch ribbon of ointment into the lower eyelid. 12/02/11 12/12/11  Lisette Paz, PA-C  oxyCODONE-acetaminophen (PERCOCET) 5-325 MG per tablet Take 1 tablet by mouth every 6 (six) hours as needed for pain. 12/02/11 12/12/11  Jaci Carrel, PA-C    Current Facility-Administered Medications  Medication Dose Route Frequency Provider Last Rate Last Dose  . acetaminophen (TYLENOL) tablet 650 mg  650 mg Oral Q6H PRN Rodman Pickle, MD       Or  . acetaminophen (TYLENOL) suppository 650 mg  650 mg Rectal Q6H PRN Amber Hairford, MD      . azithromycin (ZITHROMAX) 500 mg in dextrose 5 % 250 mL IVPB  500 mg Intravenous Q24H Marena Chancy, MD      . cefTRIAXone (ROCEPHIN) 1 g in dextrose 5 % 50 mL IVPB  1  g Intravenous Q24H Marena Chancy, MD   1 g at 12/03/11 1043  . cholecalciferol (VITAMIN D) tablet 5,000 Units  5,000 Units Oral Weekly Barbaraann Barthel, MD      . clonazePAM Scarlette Calico) tablet 1 mg  1 mg Oral QHS Rodman Pickle, MD   1 mg at 12/02/11 2136  . clopidogrel (PLAVIX) tablet 75 mg  75 mg Oral Daily Rodman Pickle, MD   75 mg at 12/03/11 0948  . diphenoxylate-atropine (LOMOTIL) 2.5-0.025 MG per tablet 1-2 tablet  1-2 tablet Oral BID PRN Amber Hairford, MD      . enoxaparin (LOVENOX) injection 40 mg  40 mg Subcutaneous Q24H Amber Hairford, MD   40 mg at 12/02/11 2138  . famotidine (PEPCID) tablet  20 mg  20 mg Oral BID Rodman Pickle, MD   20 mg at 12/03/11 0948  . fentaNYL (SUBLIMAZE) injection 100 mcg  100 mcg Intravenous Once Nelia Shi, MD   100 mcg at 12/02/11 1740  . gadobenate dimeglumine (MULTIHANCE) injection 20 mL  20 mL Intravenous Once PRN Medication Radiologist, MD   20 mL at 12/03/11 1013  . HYDROmorphone (DILAUDID) injection 1 mg  1 mg Intravenous Once Lisette Paz, PA-C   1 mg at 12/02/11 1516  . insulin aspart (novoLOG) injection 0-15 Units  0-15 Units Subcutaneous TID WC Amber Hairford, MD      . insulin aspart (novoLOG) injection 0-5 Units  0-5 Units Subcutaneous QHS Amber Hairford, MD      . iohexol (OMNIPAQUE) 300 MG/ML solution 100 mL  100 mL Intravenous Once PRN Medication Radiologist, MD   100 mL at 12/02/11 1347  . lipase/protease/amylase (CREON-10/PANCREASE) capsule 1 capsule  1 capsule Oral TID WC Rodman Pickle, MD   1 capsule at 12/03/11 0948  . megestrol (MEGACE) 40 MG/ML suspension 400 mg  400 mg Oral Daily Amber Hairford, MD   400 mg at 12/03/11 0948  . mirtazapine (REMERON) tablet 7.5 mg  7.5 mg Oral QHS Amber Hairford, MD   7.5 mg at 12/02/11 2137  . morphine 2 MG/ML injection 2 mg  2 mg Intravenous Q2H PRN Marena Chancy, MD      . morphine 4 MG/ML injection 4 mg  4 mg Intravenous Once Lisette Paz, PA-C   4 mg at 12/02/11 1253  . morphine 4 MG/ML injection 4 mg  4 mg Intravenous Once Lisette Paz, PA-C   4 mg at 12/02/11 1417  . ondansetron (ZOFRAN) tablet 4 mg  4 mg Oral Q6H PRN Amber Hairford, MD       Or  . ondansetron (ZOFRAN) injection 4 mg  4 mg Intravenous Q6H PRN Amber Hairford, MD      . potassium chloride SA (K-DUR,KLOR-CON) CR tablet 40 mEq  40 mEq Oral BID Marena Chancy, MD      . sodium chloride 0.9 % bolus 1,000 mL  1,000 mL Intravenous Once Lisette Paz, PA-C   1,000 mL at 12/02/11 1207  . DISCONTD: 0.9 %  sodium chloride infusion   Intravenous Continuous Rodman Pickle, MD 125 mL/hr at 12/02/11 2139 125 mL/hr at 12/02/11 2139  .  DISCONTD: dextrose 5 %-0.45 % sodium chloride infusion   Intravenous Continuous Amber Hairford, MD      . DISCONTD: morphine 2 MG/ML injection 1 mg  1 mg Intravenous Q2H PRN Rodman Pickle, MD   1 mg at 12/03/11 0803  . DISCONTD: morphine 4 MG/ML injection 4 mg  4 mg Intravenous Once Dione Booze, MD      .  DISCONTD: ondansetron (ZOFRAN) injection 4 mg  4 mg Intravenous PRN Lisette Paz, PA-C   4 mg at 12/02/11 1253  . DISCONTD: Vitamin D-3 TABS 1 tablet  1 tablet Oral Weekly Rodman Pickle, MD        Allergies as of 12/02/2011 - Review Complete 12/02/2011  Allergen Reaction Noted  . Diclofenac sodium  11/07/2006    No family history on file.   Social History  . Marital Status: Single    Social History Main Topics  . Smoking status: Current Everyday Smoker    Review of Systems: As per HPI, otherwise unremarkable  Physical Exam: Vital signs in last 24 hours: Temp:  [98.3 F (36.8 C)-99 F (37.2 C)] 99 F (37.2 C) (02/09 0345) Pulse Rate:  [81-95] 94  (02/09 0345) Resp:  [16-20] 20  (02/09 0345) BP: (123-184)/(58-115) 139/93 mmHg (02/09 0345) SpO2:  [93 %-98 %] 97 % (02/09 0345) Weight:  [177 lb (80.287 kg)] 177 lb (80.287 kg) (02/09 0500) Last BM Date: 12/02/11 Gen: awake, alert, NAD HEENT: anicteric, op clear CV: RRR, no mrg Pulm: CTA b/l Abd: soft, mild tenderness in the epigastrium without rebound or guarding, ND, +BS throughout Ext: no c/c/e Neuro: nonfocal   Lab Results:  Basename 12/03/11 0700 12/02/11 1139  WBC 8.9 9.5  HGB 12.3 14.0  HCT 36.9 41.1  PLT 382 451*   BMET  Basename 12/03/11 0700 12/02/11 1139  NA 135 139  K 3.2* 3.8  CL 106 108  CO2 18* 17*  GLUCOSE 91 85  BUN 5* 10  CREATININE 0.50 0.65  CALCIUM 8.7 9.8   LFT  Basename 12/03/11 0700 12/02/11 1139  PROT 6.5 --  ALBUMIN 2.8* --  AST 85* --  ALT 82* --  ALKPHOS 309* --  BILITOT 0.9 --  BILIDIR -- 0.3  IBILI -- 0.3   PT/INR  Basename 12/02/11 2028  LABPROT 20.8*  INR  1.76*    Studies/Results: X-ray Chest Pa And Lateral   12/02/2011  *RADIOLOGY REPORT*  Clinical Data: Abdominal pain, crackles on exam  CHEST - 2 VIEW  Comparison: CT chest dated 01/22/2010  Findings: Mild right lower lobe opacity, suspicious for pneumonia. Possible mild left basilar atelectasis.  No pleural effusion or pneumothorax.  Cardiomediastinal silhouette is within normal limits.  Mild degenerative changes of the visualized thoracolumbar spine.  Cholecystectomy clips.  IMPRESSION: Mild right lower lobe opacity, suspicious for pneumonia.  Original Report Authenticated By: Charline Bills, M.D.   Ct Abdomen Pelvis W Contrast  12/02/2011  *RADIOLOGY REPORT*  Clinical Data: Abdominal pain  CT ABDOMEN AND PELVIS WITH CONTRAST  Technique:  Multidetector CT imaging of the abdomen and pelvis was performed following the standard protocol during bolus administration of intravenous contrast.  Contrast: OMNIPAQUE IOHEXOL 300 MG/ML IV SOLN  Comparison: 01/22/2010  Findings: No pericardial or pleural effusion identified.  The lung bases appear clear.  Diffuse fatty infiltration of the liver parenchyma identified.  Prior cholecystectomy.  There is diffuse pancreatic calcifications consistent with chronic pancreatitis.  The common bile duct measures 1.3 cm.  This has increased from 0.7 cm previously. Within the distal common bile ducts there is a focal area of increased density measuring 1.3 cm.  This is indeterminate.  Spleen is normal.  The adrenal glands are normal.  Normal appearance of the left kidney.  Similar appearance of cyst within the upper pole of the right kidney.  Scattered upper abdominal lymph nodes are noted.  No adenopathy however.  There is no  pelvic or inguinal adenopathy.  Urinary bladder appears normal.  A ventral abdominal wall hernia contains fat only.  The patient has a small hiatal hernia.  The stomach is within normal limits.  The small bowel loops have a normal course and caliber.   Normal appearance of the colon.  There is no free fluid or fluid collections within the abdomen or pelvis.  Review of the visualized osseous structures is significant for mild spondylosis.  IMPRESSION:  1.  Changes of chronic pancreatitis. 2.  Interval increase in caliber of the common bile duct compared with 01/22/2010.  In the region of the distal CBD there is  area of increased attenuation which may represent prominent ampulla, choledocholithiasis or a small tumor. If clinically indicated, further evaluation with MRCP or ERCP and possible tissue sampling if is suggested. 3.  Fatty infiltration of the liver.  Original Report Authenticated By: Rosealee Albee, M.D.   Mr Abd W/wo Cm/mrcp  12/03/2011  *RADIOLOGY REPORT*  Clinical Data:  History of chronic pancreatitis.  CBD dilatation.  MRI ABDOMEN WITHOUT AND WITH CONTRAST (INCLUDING MRCP)  Technique:  Multiplanar multisequence MR imaging of the abdomen was performed both before and after the administration of intravenous contrast. Heavily T2-weighted images of the biliary and pancreatic ducts were obtained, and three-dimensional MRCP images were rendered by post processing.  Contrast: 20mL MULTIHANCE GADOBENATE DIMEGLUMINE 529 MG/ML IV SOLN  Comparison:  12/02/2011  Findings:  Within the lateral aspect of the right hepatic lobe there is a T2 hyperintense structure measuring 10.6 mm, image 16 of series 15.  This demonstrates peripheral and delayed enhancement. Likely benign hemangioma.  No additional focal liver abnormalities.  There is marked diffuse fatty infiltration of the liver identified.  The spleen is normal.  The left kidney appears normal.  There are several cysts identified within the right kidney.  No upper abdominal adenopathy identified.  There is no upper abdominal ascites.  Prior cholecystectomy.  There are chronic changes of pancreatitis. Common bile duct is increased in caliber measuring 1.3 cm.  No obstructing stone or enhancing masses  identified.  Distally the common bile duct narrows to 4.9 mm and there may be a distal CBD stricture.  Dilatation of the pancreatic duct is noted which measures up to 0.8 cm.  There is a nodule within the left adrenal gland measuring 10 mm, image 46.  Loss of signal from within this nodule between the in phase and out-of-phase images indicates a benign adenoma.  IMPRESSION:  1.  CBD dilatation without evidence for obstructing stone or mass. 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 2.  Chronic pancreatitis and dilatation of the pancreatic duct. 3.  Left adrenal gland adenoma. 4.  Fatty infiltration of the liver.  Original Report Authenticated By: Rosealee Albee, M.D.    Previous Endoscopies: Incomplete colonoscopy - 2011 - [facility]  Impression / Plan: 66 yo with PMH of chronic pancreatitis, hypertension, coronary artery disease, diabetes who is seen in consultation for worsening abdominal pain associated with nausea and vomiting  1. Chronic pancreatitis/abd pain/N/V/abnl imaging -- the patient's current symptoms are consistent with acute on chronic pancreatitis.  Her lipase is normal, but this is not uncommon in patients with chronic pancreatitis. Her pain is typical for her episodes of pancreatitis.  Treatment for now will be supportive including pain control, bowel rest, and IV fluids.  The CT scan and subsequent MRI reveal a dilated common bile duct with a narrowing near the head of  the pancreas.  This is likely related to chronic pancreatitis in the head of the pancreas.  However, her AST/ALT/alkaline phosphatase are higher than previous for her, and therefore ERCP for further evaluation is reasonable.  ERCP will allow for further evaluation of the stricture, including brushing to exclude malignancy, and dilation versus stenting.  I am not convinced, that her present pain is secondary to the stricture, but feel rather it is more consistent with chronic pancreatitis. Will discuss ERCP  with Dr. Marina Goodell or Dr. Russella Dar (as I do not perform ERCP), but this procedure would not be until Monday.  For now continue with pain control, IV hydration, and bowel rest.  2. Diarrhea -- her diarrhea/loose stools are chronic, and very likely she is not taking enough pancreatic enzyme replacement.  Once her diet is able to be advanced, I recommend increasing to Creon (or the like) 2 tablets with meals and one tablet before any snack.  This can be increased to 3 tablets before meals if necessary.  3. CAP -- antibiotics per primary team  We will follow   LOS: 1 day   Jay M. Pyrtle, M.D.  12/03/2011

## 2011-12-04 LAB — GLUCOSE, CAPILLARY: Glucose-Capillary: 150 mg/dL — ABNORMAL HIGH (ref 70–99)

## 2011-12-04 LAB — CBC
HCT: 35.5 % — ABNORMAL LOW (ref 36.0–46.0)
Hemoglobin: 12.1 g/dL (ref 12.0–15.0)
MCHC: 34.1 g/dL (ref 30.0–36.0)

## 2011-12-04 LAB — COMPREHENSIVE METABOLIC PANEL
Alkaline Phosphatase: 315 U/L — ABNORMAL HIGH (ref 39–117)
BUN: 6 mg/dL (ref 6–23)
GFR calc Af Amer: >90 mL/min (ref >90)
GFR calc non Af Amer: >90 mL/min (ref >90)
Glucose, Bld: 94 mg/dL (ref 70–99)
Potassium: 3.7 mEq/L (ref 3.5–5.1)
Total Bilirubin: 0.9 mg/dL (ref 0.3–1.2)
Total Protein: 6.8 g/dL (ref 6.0–8.3)

## 2011-12-04 MED ORDER — PANCRELIPASE (LIP-PROT-AMYL) 12000-38000 UNITS PO CPEP
2.0000 | ORAL_CAPSULE | Freq: Three times a day (TID) | ORAL | Status: DC
  Administered 2011-12-04 – 2011-12-06 (×7): 2 via ORAL
  Filled 2011-12-04 (×10): qty 2

## 2011-12-04 MED ORDER — LABETALOL HCL 5 MG/ML IV SOLN
10.0000 mg | INTRAVENOUS | Status: DC | PRN
  Filled 2011-12-04: qty 4

## 2011-12-04 MED ORDER — DEXTROSE-NACL 5-0.45 % IV SOLN
INTRAVENOUS | Status: DC
  Administered 2011-12-04 – 2011-12-05 (×3): via INTRAVENOUS
  Administered 2011-12-05: 125 mL/h via INTRAVENOUS

## 2011-12-04 MED ORDER — SODIUM CHLORIDE 0.9 % IV SOLN: INTRAVENOUS | Status: DC

## 2011-12-04 NOTE — Progress Notes (Signed)
FMTS Attending Note  Patient seen and examined by me, discussed with resident team and I agree with plan for today.  Abdominal pain is much improved, asking to eat.    Plan for ERCP tomorrow.  Paula Compton, MD

## 2011-12-04 NOTE — Progress Notes (Signed)
Subjective: Pain has improved since yesterday. Patient feels that morphine helps. Patient is feeling hungry. Has not had any diarrhea. No nausea no vomiting  Objective: Vital signs in last 24 hours: Temp:  [97.6 F (36.4 C)-100 F (37.8 C)] 100 F (37.8 C) (02/10 0647) Pulse Rate:  [94-96] 94  (02/10 0647) Resp:  [20] 20  (02/10 0647) BP: (115-139)/(77-98) 115/77 mmHg (02/10 0647) SpO2:  [95 %-99 %] 95 % (02/10 0647) Weight:  [170 lb 3.1 oz (77.2 kg)] 170 lb 3.1 oz (77.2 kg) (02/10 0647) Weight change: -6 lb 12.9 oz (-3.087 kg) Last BM Date: 12/02/11  Intake/Output from previous day: 02/09 0701 - 02/10 0700 In: 450 [P.O.:450] Out: 600 [Urine:600] Intake/Output this shift: not recorded    General appearance: alert, cooperative and mild distress Resp: normal air movement, normal work of breathing Cardio: regular rate and rhythm, S1, S2 normal, no murmur, click, rub or gallop GI: soft, hypoactive bowel sounds, mildly tender in right upper quadrant and epigastric area, no rebound, no guarding Extremities: extremities normal, atraumatic, no cyanosis or edema Skin: warm and dry  Lab Results:  Basename 12/04/11 0700 12/03/11 0700  WBC 7.6 8.9  HGB 12.1 12.3  HCT 35.5* 36.9  PLT 370 382   BMET  Basename 12/04/11 0700 12/03/11 0700  NA 136 135  K 3.7 3.2*  CL 106 106  CO2 20 18*  GLUCOSE 94 91  BUN 6 5*  CREATININE 0.62 0.50  CALCIUM 9.3 8.7    Studies/Results: X-ray Chest Pa And Lateral   12/02/2011  *RADIOLOGY REPORT*  Clinical Data: Abdominal pain, crackles on exam  CHEST - 2 VIEW  Comparison: CT chest dated 01/22/2010  Findings: Mild right lower lobe opacity, suspicious for pneumonia. Possible mild left basilar atelectasis.  No pleural effusion or pneumothorax.  Cardiomediastinal silhouette is within normal limits.  Mild degenerative changes of the visualized thoracolumbar spine.  Cholecystectomy clips.  IMPRESSION: Mild right lower lobe opacity, suspicious for  pneumonia.  Original Report Authenticated By: Charline Bills, M.D.   Ct Abdomen Pelvis W Contrast  12/02/2011  *RADIOLOGY REPORT*  Clinical Data: Abdominal pain  CT ABDOMEN AND PELVIS WITH CONTRAST  Technique:  Multidetector CT imaging of the abdomen and pelvis was performed following the standard protocol during bolus administration of intravenous contrast.  Contrast: OMNIPAQUE IOHEXOL 300 MG/ML IV SOLN  Comparison: 01/22/2010  Findings: No pericardial or pleural effusion identified.  The lung bases appear clear.  Diffuse fatty infiltration of the liver parenchyma identified.  Prior cholecystectomy.  There is diffuse pancreatic calcifications consistent with chronic pancreatitis.  The common bile duct measures 1.3 cm.  This has increased from 0.7 cm previously. Within the distal common bile ducts there is a focal area of increased density measuring 1.3 cm.  This is indeterminate.  Spleen is normal.  The adrenal glands are normal.  Normal appearance of the left kidney.  Similar appearance of cyst within the upper pole of the right kidney.  Scattered upper abdominal lymph nodes are noted.  No adenopathy however.  There is no pelvic or inguinal adenopathy.  Urinary bladder appears normal.  A ventral abdominal wall hernia contains fat only.  The patient has a small hiatal hernia.  The stomach is within normal limits.  The small bowel loops have a normal course and caliber.  Normal appearance of the colon.  There is no free fluid or fluid collections within the abdomen or pelvis.  Review of the visualized osseous structures is significant for mild  spondylosis.  IMPRESSION:  1.  Changes of chronic pancreatitis. 2.  Interval increase in caliber of the common bile duct compared with 01/22/2010.  In the region of the distal CBD there is  area of increased attenuation which may represent prominent ampulla, choledocholithiasis or a small tumor. If clinically indicated, further evaluation with MRCP or ERCP and  possible tissue sampling if is suggested. 3.  Fatty infiltration of the liver.  Original Report Authenticated By: Rosealee Albee, M.D.   Mr Abd W/wo Cm/mrcp  12/03/2011  *RADIOLOGY REPORT*  Clinical Data:  History of chronic pancreatitis.  CBD dilatation.  MRI ABDOMEN WITHOUT AND WITH CONTRAST (INCLUDING MRCP)  Technique:  Multiplanar multisequence MR imaging of the abdomen was performed both before and after the administration of intravenous contrast. Heavily T2-weighted images of the biliary and pancreatic ducts were obtained, and three-dimensional MRCP images were rendered by post processing.  Contrast: 20mL MULTIHANCE GADOBENATE DIMEGLUMINE 529 MG/ML IV SOLN  Comparison:  12/02/2011  Findings:  Within the lateral aspect of the right hepatic lobe there is a T2 hyperintense structure measuring 10.6 mm, image 16 of series 15.  This demonstrates peripheral and delayed enhancement. Likely benign hemangioma.  No additional focal liver abnormalities.  There is marked diffuse fatty infiltration of the liver identified.  The spleen is normal.  The left kidney appears normal.  There are several cysts identified within the right kidney.  No upper abdominal adenopathy identified.  There is no upper abdominal ascites.  Prior cholecystectomy.  There are chronic changes of pancreatitis. Common bile duct is increased in caliber measuring 1.3 cm.  No obstructing stone or enhancing masses identified.  Distally the common bile duct narrows to 4.9 mm and there may be a distal CBD stricture.  Dilatation of the pancreatic duct is noted which measures up to 0.8 cm.  There is a nodule within the left adrenal gland measuring 10 mm, image 46.  Loss of signal from within this nodule between the in phase and out-of-phase images indicates a benign adenoma.  IMPRESSION:  1.  CBD dilatation without evidence for obstructing stone or mass. 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 2.  Chronic pancreatitis and dilatation  of the pancreatic duct. 3.  Left adrenal gland adenoma. 4.  Fatty infiltration of the liver.  Original Report Authenticated By: Rosealee Albee, M.D.    Medications: I have reviewed the patient's current medications. Morphine 2 mg: 3 doses in 24 hours  Assessment/Plan: 66 yo female with history of chronic pancreatitis who presented with nausea, vomiting and abdominal pain and was found to have a normal lipase and an abdominal CT showing increased size of CBD suggestive of stone vs tumor.  1. Abdominal pain: MRCP done yesterday showing fatty liver left adrenal gland adenoma as well as CBD dilation without evidence for obstructing stone or mass. GI recommends an ERCP to be done on Monday. Greatly appreciate GIs consult. GI recommendations also include bowel rest and IV fluids as the pain is most likely due to acute on chronic pancreatitis which according to Dr. Rhea Belton can present itself with a normal lipase which can be seen in chronic pancreatitis 2. community acquired pneumonia - On azithromycin and ceftriaxone day 2. 3. Hypertension: - Monitor blood pressure as patient is restarted on IV fluids. When necessary labetalol encases blood pressure greater than 180/110  4. DM: A1C; 6.2 - on insulin sliding scale - monitor hyperglycemia  5. Fen/Gi: - advance diet as tolerated, per  GI recs  6. Dispo: - pending improvement    LOS: 2 days   Nikolaus Pienta 12/04/2011, 9:58 AM

## 2011-12-04 NOTE — Progress Notes (Signed)
[facility] Progress Note  Subjective: Feels better.  Nausea has gone and no further vomiting. No diarrhea today or yesterday Less pain  Objective:  Vital signs in last 24 hours: Temp:  [97.6 F (36.4 C)-100 F (37.8 C)] 100 F (37.8 C) (02/10 0647) Pulse Rate:  [94-96] 94  (02/10 0647) Resp:  [20] 20  (02/10 0647) BP: (115-139)/(77-98) 115/77 mmHg (02/10 0647) SpO2:  [95 %-99 %] 95 % (02/10 0647) Weight:  [170 lb 3.1 oz (77.2 kg)] 170 lb 3.1 oz (77.2 kg) (02/10 0647) Last BM Date: 12/02/11 Gen: awake, alert, NAD  HEENT: anicteric, op clear  CV: RRR, no mrg  Pulm: CTA b/l  Abd: soft, mild tenderness in the epigastrium without rebound or guarding, mild distention, +BS throughout  Ext: no c/c/e  Neuro: nonfocal   Intake/Output from previous day: 02/09 0701 - 02/10 0700 In: 450 [P.O.:450] Out: 600 [Urine:600] Intake/Output this shift:    Lab Results:  Basename 12/04/11 0700 12/03/11 0700 12/02/11 1139  WBC 7.6 8.9 9.5  HGB 12.1 12.3 14.0  HCT 35.5* 36.9 41.1  PLT 370 382 451*   BMET  Basename 12/04/11 0700 12/03/11 0700 12/02/11 1139  NA 136 135 139  K 3.7 3.2* 3.8  CL 106 106 108  CO2 20 18* 17*  GLUCOSE 94 91 85  BUN 6 5* 10  CREATININE 0.62 0.50 0.65  CALCIUM 9.3 8.7 9.8   LFT  Basename 12/04/11 0700 12/02/11 1139  PROT 6.8 --  ALBUMIN 2.9* --  AST 74* --  ALT 77* --  ALKPHOS 315* --  BILITOT 0.9 --  BILIDIR -- 0.3  IBILI -- 0.3   PT/INR  Basename 12/02/11 2028  LABPROT 20.8*  INR 1.76*    Studies/Results: X-ray Chest Pa And Lateral   12/02/2011  *RADIOLOGY REPORT*  Clinical Data: Abdominal pain, crackles on exam  CHEST - 2 VIEW  Comparison: CT chest dated 01/22/2010  Findings: Mild right lower lobe opacity, suspicious for pneumonia. Possible mild left basilar atelectasis.  No pleural effusion or pneumothorax.  Cardiomediastinal silhouette is within normal limits.  Mild degenerative changes of the visualized thoracolumbar spine.   Cholecystectomy clips.  IMPRESSION: Mild right lower lobe opacity, suspicious for pneumonia.  Original Report Authenticated By: Charline Bills, M.D.   Ct Abdomen Pelvis W Contrast  12/02/2011  *RADIOLOGY REPORT*  Clinical Data: Abdominal pain  CT ABDOMEN AND PELVIS WITH CONTRAST  Technique:  Multidetector CT imaging of the abdomen and pelvis was performed following the standard protocol during bolus administration of intravenous contrast.  Contrast: OMNIPAQUE IOHEXOL 300 MG/ML IV SOLN  Comparison: 01/22/2010  Findings: No pericardial or pleural effusion identified.  The lung bases appear clear.  Diffuse fatty infiltration of the liver parenchyma identified.  Prior cholecystectomy.  There is diffuse pancreatic calcifications consistent with chronic pancreatitis.  The common bile duct measures 1.3 cm.  This has increased from 0.7 cm previously. Within the distal common bile ducts there is a focal area of increased density measuring 1.3 cm.  This is indeterminate.  Spleen is normal.  The adrenal glands are normal.  Normal appearance of the left kidney.  Similar appearance of cyst within the upper pole of the right kidney.  Scattered upper abdominal lymph nodes are noted.  No adenopathy however.  There is no pelvic or inguinal adenopathy.  Urinary bladder appears normal.  A ventral abdominal wall hernia contains fat only.  The patient has a small hiatal hernia.  The stomach is within normal limits.  The small bowel loops have a normal course and caliber.  Normal appearance of the colon.  There is no free fluid or fluid collections within the abdomen or pelvis.  Review of the visualized osseous structures is significant for mild spondylosis.  IMPRESSION:  1.  Changes of chronic pancreatitis. 2.  Interval increase in caliber of the common bile duct compared with 01/22/2010.  In the region of the distal CBD there is  area of increased attenuation which may represent prominent ampulla, choledocholithiasis or a  small tumor. If clinically indicated, further evaluation with MRCP or ERCP and possible tissue sampling if is suggested. 3.  Fatty infiltration of the liver.  Original Report Authenticated By: Rosealee Albee, M.D.   Mr Abd W/wo Cm/mrcp  12/03/2011  *RADIOLOGY REPORT*  Clinical Data:  History of chronic pancreatitis.  CBD dilatation.  MRI ABDOMEN WITHOUT AND WITH CONTRAST (INCLUDING MRCP)  Technique:  Multiplanar multisequence MR imaging of the abdomen was performed both before and after the administration of intravenous contrast. Heavily T2-weighted images of the biliary and pancreatic ducts were obtained, and three-dimensional MRCP images were rendered by post processing.  Contrast: 20mL MULTIHANCE GADOBENATE DIMEGLUMINE 529 MG/ML IV SOLN  Comparison:  12/02/2011  Findings:  Within the lateral aspect of the right hepatic lobe there is a T2 hyperintense structure measuring 10.6 mm, image 16 of series 15.  This demonstrates peripheral and delayed enhancement. Likely benign hemangioma.  No additional focal liver abnormalities.  There is marked diffuse fatty infiltration of the liver identified.  The spleen is normal.  The left kidney appears normal.  There are several cysts identified within the right kidney.  No upper abdominal adenopathy identified.  There is no upper abdominal ascites.  Prior cholecystectomy.  There are chronic changes of pancreatitis. Common bile duct is increased in caliber measuring 1.3 cm.  No obstructing stone or enhancing masses identified.  Distally the common bile duct narrows to 4.9 mm and there may be a distal CBD stricture.  Dilatation of the pancreatic duct is noted which measures up to 0.8 cm.  There is a nodule within the left adrenal gland measuring 10 mm, image 46.  Loss of signal from within this nodule between the in phase and out-of-phase images indicates a benign adenoma.  IMPRESSION:  1.  CBD dilatation without evidence for obstructing stone or mass. This is likely due to  the distal common bile duct stricture. 2.  Chronic pancreatitis and dilatation of the pancreatic duct. 3.  Left adrenal gland adenoma. 4.  Fatty infiltration of the liver.  Original Report Authenticated By: Rosealee Albee, M.D.    Assessment / Plan: 66 yo with PMH of chronic pancreatitis, hypertension, coronary artery disease, diabetes who is seen in consultation for worsening abdominal pain associated with nausea and vomiting   1. Acute on chronic pancreatitis with CBD stricture and elevated LFTs -- overall abd pain has improved with bowel rest.  No further N/V.  I expect her pancreas is settling down somewhat.  Wants to try a diet today.  Will give her full liquids.  NPO after MN for likely ERCP tomorrow.  Continue with hydration until pt taking enough PO.  Pain control as you are.  We discussed ERCP and she is agreeable to proceed -- this is to eval CBD stricture and likely dilate vs stent.   2. Diarrhea -- none now, which further suggests this is related to eating and likely panc insuff.  I will increase her Creon to 2  tabs TIDAC.  She should get an additional tablet with snacks.  This can be further adjusted to control diarrhea.   3. CAP -- no dyspnea, fevers, or hypoxia, antibiotics per primary team    Principal Problem:  *Biliary obstruction Active Problems:  Common bile duct (CBD) stricture     LOS: 2 days   [name] Foley M  12/04/2011, 10:16 AM

## 2011-12-05 ENCOUNTER — Telehealth: Payer: Self-pay

## 2011-12-05 ENCOUNTER — Encounter (HOSPITAL_COMMUNITY)
Admission: EM | Disposition: A | Discharge status: Home / Self Care | Payer: Self-pay | Source: Home / Self Care | Attending: Family Medicine

## 2011-12-05 DIAGNOSIS — K839 Disease of biliary tract, unspecified: Secondary | ICD-10-CM

## 2011-12-05 DIAGNOSIS — K861 Other chronic pancreatitis: Secondary | ICD-10-CM

## 2011-12-05 LAB — COMPREHENSIVE METABOLIC PANEL
AST: 69 U/L — ABNORMAL HIGH (ref 0–37)
Albumin: 2.8 g/dL — ABNORMAL LOW (ref 3.5–5.2)
Alkaline Phosphatase: 289 U/L — ABNORMAL HIGH (ref 39–117)
BUN: 5 mg/dL — ABNORMAL LOW (ref 6–23)
CO2: 18 mEq/L — ABNORMAL LOW (ref 19–32)
Chloride: 108 mEq/L (ref 96–112)
GFR calc non Af Amer: >90 mL/min (ref >90)
Potassium: 4.1 mEq/L (ref 3.5–5.1)
Total Bilirubin: 0.6 mg/dL (ref 0.3–1.2)

## 2011-12-05 LAB — GLUCOSE, CAPILLARY
Glucose-Capillary: 118 mg/dL — ABNORMAL HIGH (ref 70–99)
Glucose-Capillary: 121 mg/dL — ABNORMAL HIGH (ref 70–99)

## 2011-12-05 LAB — CBC
HCT: 35 % — ABNORMAL LOW (ref 36.0–46.0)
Platelets: 306 10*3/uL (ref 150–400)
RBC: 5.37 MIL/uL — ABNORMAL HIGH (ref 3.87–5.11)
RDW: 18.2 % — ABNORMAL HIGH (ref 11.5–15.5)
WBC: 7.4 10*3/uL (ref 4.0–10.5)

## 2011-12-05 SURGERY — ERCP, WITH INTERVENTION IF INDICATED
Anesthesia: General

## 2011-12-05 MED ORDER — PHYTONADIONE 5 MG PO TABS
10.0000 mg | ORAL_TABLET | Freq: Every day | ORAL | Status: DC
  Administered 2011-12-05 – 2011-12-06 (×2): 10 mg via ORAL
  Filled 2011-12-05 (×3): qty 2

## 2011-12-05 NOTE — Progress Notes (Signed)
Seen and examined.  Agree with Dr. Gwenlyn Saran.  Appreciate GI help.  ERCP is not planned this admit.  It will be done as outpatient with plavix held.  Also, PT/INR is up.  Reversible?  Will try oral vit K.    She is tolerating diet well and pain is under control.  Plan is to DC in am if remains stable.  FU with GI for procedure.

## 2011-12-05 NOTE — Progress Notes (Signed)
I have taken an interval history, reviewed the chart and examined the patient. I agree with the extender's note, impression and recommendations. Awaiting Dr. Christella Hartigan review of imaging studies for consideration of EUS to R/O a mass. Most likely the CBD and PD strictures are directly related to chronic pancreatitis. This is unlikely to be a pancreatic malignancy. LFT pattern is typical for biliary obstruction. Biliary drainage is not urgent in this setting.  Venita Lick. Russella Dar MD Clementeen Graham

## 2011-12-05 NOTE — Progress Notes (Signed)
     [facility] Daily Rounding Note 12/05/2011, 10:14 AM  SUBJECTIVE: No ERCP/EUS for today.  Awaiting plans from Dr. Wendall Papa re:  EUS later in week.  He needs to review films.   Abdominal pain improved but using the Morphine regularly.  Hungry this AM, just starting breakfast.   No n/v  OBJECTIVE: General:  Does not look ill.  Obese.   Vital signs in last 24 hours: Temp:  [98.2 F (36.8 C)-99.8 F (37.7 C)] 99.1 F (37.3 C) (02/11 0443) Pulse Rate:  [70-99] 70  (02/11 0443) Resp:  [20] 20  (02/11 0443) BP: (100-125)/(66-89) 100/66 mmHg (02/11 0443) SpO2:  [95 %-97 %] 96 % (02/11 0443) Weight:  [171 lb 1.2 oz (77.6 kg)] 171 lb 1.2 oz (77.6 kg) (02/11 0506) Last BM Date: 12/04/11  Heart: RRR Chest: clear.  No cough or dyspnea. Abdomen: soft, no mass, tender across upper abdomen.  Extremities: no edema. Neuro/Psych:  Pleasant, not agitated.   Intake/Output from previous day: 02/10 0701 - 02/11 0700 In: 1475 [P.O.:600; I.V.:325; IV Piggyback:550] Out: -   Lab Results:  Basename 12/05/11 0618 12/04/11 0700 12/03/11 0700  WBC 7.4 7.6 8.9  HGB 12.0 12.1 12.3  HCT 35.0* 35.5* 36.9  PLT 306 370 382   BMET  Basename 12/05/11 0618 12/04/11 0700 12/03/11 0700  NA 137 136 135  K 4.1 3.7 3.2*  CL 108 106 106  CO2 18* 20 18*  GLUCOSE 95 94 91  BUN 5* 6 5*  CREATININE 0.58 0.62 0.50  CALCIUM 9.1 9.3 8.7   LFT  Basename 12/05/11 0618 12/02/11 1139  PROT 6.6 --  ALBUMIN 2.8* --  AST 69* --  ALT 70* --  ALKPHOS 289* --  BILITOT 0.6 --  BILIDIR -- 0.3  IBILI -- 0.3   PT/INR  Basename 12/05/11 0618 12/02/11 2028  LABPROT 20.0* 20.8*  INR 1.67* 1.76*   ASSESMENT: 1. Acute on chronic pancreatitis.  CBD and PD stricture on imaging.  Need to determine if this is secondary to her chronic pancreatitis (most likely) or if there is malignancy present, though no mass seen on CT scan/MRCP.  On creon for pancreatic insufficiency.  On megace for anorexia PTA. 2.  Fatty  liver.  3.  Comm acquired pneumonia, on azithromycin and rocephin 4.  DM 2.  No insulin PTA. 5.  Chronic Plavix, not currently held.  Coronary stent in place.   6.  Hx alcolholism.  Sober for 3 years.   PLAN: 1.  Await word from Dr. Christella Hartigan.   2.  Allow food.   LOS: 3 days   Jennye Moccasin  12/05/2011, 10:14 AM Pager: [phone]

## 2011-12-05 NOTE — Progress Notes (Signed)
Subjective: Abdominal pain much improved. Rates it at a 6/10. Still using morphine about 4x daily. Was able to tolerate clear diet yesterday. Was made NPO at midnight for ERCP tonight and is feeling hungry. No diarrhea. No nausea, no vomiting.  Objective: Vital signs in last 24 hours: Temp:  [98.2 F (36.8 C)-99.8 F (37.7 C)] 99.1 F (37.3 C) (02/11 0443) Pulse Rate:  [70-99] 70  (02/11 0443) Resp:  [20] 20  (02/11 0443) BP: (100-125)/(66-89) 100/66 mmHg (02/11 0443) SpO2:  [95 %-97 %] 96 % (02/11 0443) Weight:  [171 lb 1.2 oz (77.6 kg)] 171 lb 1.2 oz (77.6 kg) (02/11 0506) Weight change: 14.1 oz (0.4 kg) Last BM Date: 12/02/11  Intake/Output from previous day: 02/10 0701 - 02/11 0700 In: 1475 [P.O.:600; I.V.:325; IV Piggyback:550] Out: -  Intake/Output this shift: not recorded    General appearance: alert, cooperative and in no distress. Pleasant and conversational sitting at the edge of the bed.  Resp: normal air movement, normal work of breathing, no wheezing or crackles Cardio: regular rate and rhythm, S1, S2 normal, no murmur, click, rub or gallop GI: soft, hypoactive bowel sounds, mildly tender in epigastric area, no rebound, no guarding Extremities: extremities normal, atraumatic, no cyanosis or edema Skin: warm and dry  Lab Results:  Basename 12/04/11 0700 12/03/11 0700  WBC 7.6 8.9  HGB 12.1 12.3  HCT 35.5* 36.9  PLT 370 382   BMET  Basename 12/04/11 0700 12/03/11 0700  NA 136 135  K 3.7 3.2*  CL 106 106  CO2 20 18*  GLUCOSE 94 91  BUN 6 5*  CREATININE 0.62 0.50  CALCIUM 9.3 8.7    Studies/Results: Mr Abd W/wo Cm/mrcp  12/03/2011  *RADIOLOGY REPORT*  Clinical Data:  History of chronic pancreatitis.  CBD dilatation.  MRI ABDOMEN WITHOUT AND WITH CONTRAST (INCLUDING MRCP)  Technique:  Multiplanar multisequence MR imaging of the abdomen was performed both before and after the administration of intravenous contrast. Heavily T2-weighted images of the biliary  and pancreatic ducts were obtained, and three-dimensional MRCP images were rendered by post processing.  Contrast: 20mL MULTIHANCE GADOBENATE DIMEGLUMINE 529 MG/ML IV SOLN  Comparison:  12/02/2011  Findings:  Within the lateral aspect of the right hepatic lobe there is a T2 hyperintense structure measuring 10.6 mm, image 16 of series 15.  This demonstrates peripheral and delayed enhancement. Likely benign hemangioma.  No additional focal liver abnormalities.  There is marked diffuse fatty infiltration of the liver identified.  The spleen is normal.  The left kidney appears normal.  There are several cysts identified within the right kidney.  No upper abdominal adenopathy identified.  There is no upper abdominal ascites.  Prior cholecystectomy.  There are chronic changes of pancreatitis. Common bile duct is increased in caliber measuring 1.3 cm.  No obstructing stone or enhancing masses identified.  Distally the common bile duct narrows to 4.9 mm and there may be a distal CBD stricture.  Dilatation of the pancreatic duct is noted which measures up to 0.8 cm.  There is a nodule within the left adrenal gland measuring 10 mm, image 46.  Loss of signal from within this nodule between the in phase and out-of-phase images indicates a benign adenoma.  IMPRESSION:  1.  CBD dilatation without evidence for obstructing stone or mass. 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 2.  Chronic pancreatitis and dilatation of the pancreatic duct. 3.  Left adrenal gland adenoma. 4.  Fatty infiltration of  the liver.  Original Report Authenticated By: Rosealee Albee, M.D.    Medications: I have reviewed the patient's current medications. Morphine 2 mg: 4 doses in 24 hours Azithromycin 500mg  q24 day 3 Ceftriaxone 1g day 3  Assessment/Plan: 66 yo female with history of chronic pancreatitis who presented with nausea, vomiting and abdominal pain and was found to have a normal lipase and an abdominal CT showing  increased size of CBD suggestive of stone vs tumor. MRCP showed fatty liver left adrenal gland adenoma as well as CBD dilation without evidence for obstructing stone or mass.  1. Abdominal pain: most likely due to acute on chronic pancreatitis - ERCP today to evaluate stricture area - will follow GI recs for diet after procedure. Patient has been tolerating a clear diet well.  - will continue with recommendations to increase creon to 2 tabs with meals and 1 tab with snacks for pancreatic enzyme replacement which should help with chronic diarrhea.  2. Community acquired pneumonia - azithromycin and ceftriaxone day 3. - will discharge patient on either azithromycin or levofloxacin.   3. Hypertension: - patient has not had elevated blood pressure in a couple of days. Continue monitoring.   4. DM: A1C; 6.2 - on insulin sliding scale - monitor hyperglycemia  5. Fen/Gi: - D5 1/2 NS at 125cc/hr - follow GI diet recs for after ERCP  6. Dispo: - pending improvement    LOS: 3 days   Frederika Hukill 12/05/2011, 7:07 AM

## 2011-12-05 NOTE — Telephone Encounter (Signed)
Message copied by Donata Duff on Mon Dec 05, 2011  2:36 PM ------      Message from: Rob Bunting P      Created: Mon Dec 05, 2011  2:19 PM       Rheanna Sergent, she is an inpt now.  Has abnormal bile duct, h/o chronic pancreatitis.  Needs upper EUS, WL, propofol as an outpatient. Will need to contact her PCP about holding plavix for 5 days prior. Can you coordinate with Sarah, referring from Dr. Rhea Belton, Wyvonnia Dusky (seeing her in hosp as inpt).

## 2011-12-06 ENCOUNTER — Telehealth: Payer: Self-pay | Admitting: *Deleted

## 2011-12-06 ENCOUNTER — Other Ambulatory Visit: Payer: Self-pay

## 2011-12-06 DIAGNOSIS — K831 Obstruction of bile duct: Secondary | ICD-10-CM

## 2011-12-06 DIAGNOSIS — J189 Pneumonia, unspecified organism: Secondary | ICD-10-CM | POA: Diagnosis present

## 2011-12-06 DIAGNOSIS — K861 Other chronic pancreatitis: Secondary | ICD-10-CM

## 2011-12-06 DIAGNOSIS — R109 Unspecified abdominal pain: Secondary | ICD-10-CM

## 2011-12-06 MED ORDER — PHYTONADIONE 5 MG PO TABS: 10.0000 mg | ORAL_TABLET | Freq: Every day | ORAL | Status: DC

## 2011-12-06 MED ORDER — RANITIDINE HCL 150 MG PO TABS: 150.0000 mg | ORAL_TABLET | Freq: Two times a day (BID) | ORAL | Status: DC

## 2011-12-06 MED ORDER — PANCRELIPASE (LIP-PROT-AMYL) 12000-38000 UNITS PO CPEP: 2.0000 | ORAL_CAPSULE | Freq: Three times a day (TID) | ORAL | Status: DC

## 2011-12-06 MED ORDER — HYDROCODONE-ACETAMINOPHEN 5-500 MG PO CAPS: 1.0000 | ORAL_CAPSULE | Freq: Four times a day (QID) | ORAL | Status: AC | PRN

## 2011-12-06 MED ORDER — AZITHROMYCIN 500 MG PO TABS: 500.0000 mg | ORAL_TABLET | Freq: Every day | ORAL | Status: AC

## 2011-12-06 NOTE — Telephone Encounter (Signed)
[name] Foley has been notified of the instructions and appt and will give to the pt

## 2011-12-06 NOTE — Progress Notes (Signed)
Subjective: Abdominal pain much improved. Required morphine 2mg  x3 in last 24hrs. No diarrhea. Eating a full diet. No nausea/vomiting. Feels comfortable going home.  Objective: Vital signs in last 24 hours: Temp:  [97.9 F (36.6 C)-99.4 F (37.4 C)] 97.9 F (36.6 C) (02/12 0500) Pulse Rate:  [75-92] 75  (02/12 0500) Resp:  [20] 20  (02/12 0500) BP: (105-130)/(79-93) 130/80 mmHg (02/12 0500) SpO2:  [97 %-98 %] 98 % (02/12 0500) Weight:  [175 lb 9.6 oz (79.652 kg)] 175 lb 9.6 oz (79.652 kg) (02/12 0500) Weight change: 4 lb 8.4 oz (2.052 kg) Last BM Date: 12/04/11  Intake/Output from previous day: 02/11 0701 - 02/12 0700 In: 2891 [P.O.:940; I.V.:1651; IV Piggyback:300] Out: -  Intake/Output this shift: not recorded    General appearance: alert, cooperative and in no distress.  Resp: normal air movement, normal work of breathing, no wheezing or crackles Cardio: regular rate and rhythm, S1, S2 normal, no murmur, click, rub or gallop GI: soft, present bowel sounds, mildly tender in epigastric area but improved from yesterday's exam, no rebound, no guarding Extremities: extremities normal, atraumatic, no cyanosis or edema Skin: warm and dry  Lab Results:  Basename 12/05/11 0618 12/04/11 0700  WBC 7.4 7.6  HGB 12.0 12.1  HCT 35.0* 35.5*  PLT 306 370   BMET  Basename 12/05/11 0618 12/04/11 0700  NA 137 136  K 4.1 3.7  CL 108 106  CO2 18* 20  GLUCOSE 95 94  BUN 5* 6  CREATININE 0.58 0.62  CALCIUM 9.1 9.3    Studies/Results: No results found.  Medications: I have reviewed the patient's current medications. Morphine 2 mg: 3 doses in 24 hours Azithromycin 500mg  q24 day 4 Ceftriaxone 1g day 4  Assessment/Plan: 66 yo female with history of chronic pancreatitis who presented with nausea, vomiting and abdominal pain. 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 Need to determine if this is due to her chronic pancreatitis of if this is secondary to a tumor/mass. MRCP showed fatty  liver left adrenal gland adenoma as well as CBD dilation without evidence for obstructing stone or mass.   1. Abdominal pain: most likely due to acute on chronic pancreatitis - abdominal pain improved. Tolerating a normal diet. Will switch to oral pain medication.  - EUS to be done as an outpatient. Will follow up on when this will be scheduled. Patient will need plavix held 5 days prior to procedure.  - continue creon 2 tabs with meals and 1 tab with snacks, per GI recs.  2. Elevated INR:  - day 2 of vitamin K. Patient to be sent home with another dose of vitamin K 10mg  and INR to be rechecked as an outpatient. This could be due to  vit K malabsorption due to chronic pancreatitis. It could also be due to loss of liver's synthetic function given fatty liver, elevated liver enzymes and h/o alcoholism.   3. Community acquired pneumonia - azithromycin and ceftriaxone day 4. - d/c patient home on azithromycin for a total of 7 days  3. Hypertension: - patient has not had elevated blood pressure in a couple of days. Continue monitoring.   4. DM: A1C; 6.2 - on insulin sliding scale - monitor hyperglycemia  5. Fen/Gi: - regular diet - will d.c fluids today  6. Dispo: - home today with follow up for EUS as outpatient     LOS: 4 days   Nik Gorrell 12/06/2011, 9:46 AM

## 2011-12-06 NOTE — Progress Notes (Signed)
Discussed with Dr Allyson Sabal. OK to stop Plavix as needed for EUS. LAD DES in 2007, patent in 2008. We will be available as needed.  Corine Shelter PA-C 12/06/2011 1:01 PM

## 2011-12-06 NOTE — Progress Notes (Signed)
     [facility] Daily Rounding Note 12/06/2011, 9:22 AM  SUBJECTIVE: Eating 100% of meals.  Abd pain improved but still using Morphine injection prn.  Used twice yesterday, once this AM.  Stools loose but not frequent.  She says pepcid and Nexium both cause loose stools.   Feels ready for discharge.  OBJECTIVE: General: looks well  Vital signs in last 24 hours: Temp:  [97.9 F (36.6 C)-99.4 F (37.4 C)] 97.9 F (36.6 C) (02/12 0500) Pulse Rate:  [75-92] 75  (02/12 0500) Resp:  [20] 20  (02/12 0500) BP: (105-130)/(79-93) 130/80 mmHg (02/12 0500) SpO2:  [97 %-98 %] 98 % (02/12 0500) Weight:  [175 lb 9.6 oz (79.652 kg)] 175 lb 9.6 oz (79.652 kg) (02/12 0500) Last BM Date: 12/04/11  Heart: RRR.  No MRG. Chest: Clear, diminished BS on right base.  No cough or dyspnea. Abdomen: Soft, minor upper abdominal tenderness  Extremities: no edema Neuro/Psych:   Pleasant, relaxed, appropriate.  Intake/Output from previous day: 02/11 0701 - 02/12 0700 In: 2891 [P.O.:940; I.V.:1651; IV Piggyback:300] Out: -   Intake/Output this shift:    Lab Results:  Basename 12/05/11 0618 12/04/11 0700  WBC 7.4 7.6  HGB 12.0 12.1  HCT 35.0* 35.5*  PLT 306 370   BMET  Basename 12/05/11 0618 12/04/11 0700  NA 137 136  K 4.1 3.7  CL 108 106  CO2 18* 20  GLUCOSE 95 94  BUN 5* 6  CREATININE 0.58 0.62  CALCIUM 9.1 9.3   LFT  Basename 12/05/11 0618  PROT 6.6  ALBUMIN 2.8*  AST 69*  ALT 70*  ALKPHOS 289*  BILITOT 0.6  BILIDIR --  IBILI --   PT/INR  Basename 12/05/11 0618  LABPROT 20.0*  INR 1.67*   ASSESMENT: 1. Acute on chronic pancreatitis. CBD and PD stricture on imaging. Need to determine if this is secondary to her chronic pancreatitis (most likely) or if there is malignancy present, though no mass seen on CT scan/MRCP. On creon for pancreatic insufficiency. On megace for anorexia PTA.  In process of arranging outpatient EUS, off Plavix. Abdominal pain much improved, may  need limited supply of po narcotics at discharge for prn use. 2. Fatty liver.  3. Comm acquired pneumonia, on azithromycin and rocephin  4. DM 2. No insulin PTA.  5. Chronic Plavix, not currently held. Coronary stent in place. Called Laura Ingold, [facility] nurse practioner, who will research and advise re: holding Plavix 6. Hx alcolholism. Sober for 3 years. 7.  Loose stools, Creon dose was increased and diarrhea so far improved.   PLAN: 1.  Home today? 2.  Arrangements for out pt EUS in progress. 3.  May want to try ranitidine instead of famotidine as outpt, see if this has less tendency to induce diarrhea.     LOS: 4 days   [name] Foley  12/06/2011, 9:22 AM Pager: [phone]  

## 2011-12-06 NOTE — Telephone Encounter (Signed)
Received call from [place] at [facility].  Need clarification on 2 meds written today by Dr. Gwenlyn Saran.  Creon 10 unavailable and has been replaced with Creon 24,000 units.  Also, unable to get Vitamin K po tabs.  Found a supplier, but med is $1000 for 100 tabs.  Paged Dr. Gwenlyn Saran.  Pharmacy informed Ok to change to Creon 24,000 units and Dr. Gwenlyn Saran will call pharmacy about Vit. K tabs.   Gaylene Brooks, RN

## 2011-12-06 NOTE — Progress Notes (Signed)
I have taken an interval history, reviewed the chart and examined the patient. I agree with the extender's note, impression and recommendations. Pt appears to nearly ready for discharge from GI standpoint. Dr. Christella Hartigan will perform EUS as outpatient off Plavix and we will make arrangements.  Venita Lick. Russella Dar MD Clementeen Graham

## 2011-12-06 NOTE — Progress Notes (Signed)
Seen and examined.  Discussed in rounds with team and specifically with Dr. Gwenlyn Saran.  Agree with her management.  Briefly, [name] Foley feels much better.  Pain is well controled and she is tolerating her diet well.  ERCP planned for outpatient after she has been off plavix for 5 days.  Also, she is receiving Vit K to see if it will correct her elevated PT/INR.  Agree with DC today with close outpatient follow up.

## 2011-12-08 ENCOUNTER — Other Ambulatory Visit (HOSPITAL_COMMUNITY): Payer: Medicaid Other

## 2011-12-08 NOTE — Discharge Summary (Signed)
Physician Discharge Summary  Patient ID: [name] Foley MRN: [id number] DOB/AGE: Mar 04, 1946 66 y.o.  Admit date: 12/02/2011 Discharge date: 12/08/2011  Admission Diagnoses: 1. Abdominal pain 2. Community Acquired Pneumonia 3. Diabetes Discharge Diagnoses:  Principal Problem:  *Biliary obstruction Active Problems:  DIABETES MELLITUS II, UNCOMPLICATED  HYPERLIPIDEMIA  TOBACCO DEPENDENCE  HYPERTENSION, BENIGN SYSTEMIC  CAD- LAD DES 2007, patent 2008  PANCREATITIS, CHRONIC with acute exacerbation  Common bile duct (CBD) stricture  CAP (community acquired pneumonia)   Discharged Condition: good  Hospital Course:  66 yo F with PMH of chronic pancreatitis, DM and HTN who presented to the ED today for 1 week history of worsening abdominal pain associated with nausea and vomiting.   1. Abdominal pain: Patient had history of chronic pancreatitis.  Her pain was typical for her episodes of pancreatitis. Her lipase on admission was 6. Dr. Rhea Belton with gastroenterology was consulted who thought her pain to be due to acute on chronic pancreatitis and that the lipase being normal was not uncommon in patients with chronic pancreatitis.  Abdominal CT showed increased size of common bile duct, potentially representing either a stone or tumor. An MRCP was done to evaluate this further which did not show any evidence for obstructing stone or mass. An endoscopic ultrasound was scheduled as an outpatient to determine if this is secondary to her chronic pancreatitis (most likely) or if there is malignancy present, though no mass seen on CT scan/MRCP.  For pancreatitis management, patient was put on bowel rest with IV fluids and morphine 2mg  q2hr with resolution of abdominal pain and nausea by the end of the hospitalization. She was also having some diarrhea which was attributed to pancreatic enzyme deficiency for which creon was increased to 2 tabs with meals and 1 tab with snacks. Patient was also on  famotidine for potential of GERD causing abdominal pain. On the day of discharge, patient was tolerating a normal diet and was discharged on vicodin 5/500.   2. Community acquired pneumonia: Patient was found to have lower right lobe crackles on exam. BNP was normal at 75. Chest xray showed mild right lower lobe opacity suspicious for pneumonia. Patient was started on ceftriaxone and azithromycin. On discharge, she was discharged on 1 more day of azithromycin for a total coverage of 5 days for atypical pneumonia.   3. Elevated INR:  Patient was found to have elevated INR at 1.6. This was thought to be either due to malabsorption of vitamin K due to chronic pancreatitis or to liver dysfunction (given findings on CT of fatty infiltration of the liver, elevated AST/ALT and h/o alcoholism). Patient received 2 doses of vitamin K 10mg . She was discharged on one more dose but did not obtain it as it could not be found in most pharmacies and was extremely expensive (in the thousand dollar range).   4. History of stent placement in 2006: Patient was kept on home plavix. Blood pressures were monitored and remained in the 100's-130's systolic during the admission and was not discharged on any blood pressure medication  5. DM: A1C: 6.2. Patient was kept on sliding scale and was discharged on metformin 500mg  daily  Consults: 1. Gastroenterology: Dr. Rhea Belton  Significant Diagnostic Studies:  Abdominal and pelvis CT with contrast: IMPRESSION:  1. Changes of chronic pancreatitis.  2. Interval increase in caliber of the common bile duct compared  with 01/22/2010. In the region of the distal CBD there is area of  increased attenuation which may represent prominent ampulla,  choledocholithiasis or a small tumor. If clinically indicated,  further evaluation with MRCP or ERCP and possible tissue sampling  if is suggested.  3. Fatty infiltration of the liver.   MRCP 12/03/11: IMPRESSION:  1. CBD dilatation  without evidence for obstructing stone or mass.  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  2. Chronic pancreatitis and dilatation of the pancreatic duct.  3. Left adrenal gland adenoma.  4. Fatty infiltration of the liver.  CBC    Component Value Date/Time   WBC 7.4 12/05/2011 0618   RBC 5.37* 12/05/2011 0618   HGB 12.0 12/05/2011 0618   HCT 35.0* 12/05/2011 0618   PLT 306 12/05/2011 0618   MCV 65.2* 12/05/2011 0618   MCH 22.3* 12/05/2011 0618   MCHC 34.3 12/05/2011 0618   RDW 18.2* 12/05/2011 0618   LYMPHSABS 3.6 12/02/2011 1139   MONOABS 0.8 12/02/2011 1139   EOSABS 0.4 12/02/2011 1139   BASOSABS 0.0 12/02/2011 1139    CMP     Component Value Date/Time   NA 137 12/05/2011 0618   K 4.1 12/05/2011 0618   CL 108 12/05/2011 0618   CO2 18* 12/05/2011 0618   GLUCOSE 95 12/05/2011 0618   BUN 5* 12/05/2011 0618   CREATININE 0.58 12/05/2011 0618   CALCIUM 9.1 12/05/2011 0618   PROT 6.6 12/05/2011 0618   ALBUMIN 2.8* 12/05/2011 0618   AST 69* 12/05/2011 0618   ALT 70* 12/05/2011 0618   ALKPHOS 289* 12/05/2011 0618   BILITOT 0.6 12/05/2011 0618   GFRNONAA >90 12/05/2011 0618   GFRAA >90 12/05/2011 0618    Discharge Exam: Blood pressure 130/80, pulse 75, temperature 97.9 F (36.6 C), temperature source Oral, resp. rate 20, height 5\' 2"  (1.575 m), weight 175 lb 9.6 oz (79.652 kg), SpO2 98.00%. Vital signs in last 24 hours:   Last BM Date: 12/04/11  General appearance: alert, cooperative and in no distress.  Resp: normal air movement, normal work of breathing, no wheezing or crackles  Cardio: regular rate and rhythm, S1, S2 normal, no murmur, click, rub or gallop  GI: soft, present bowel sounds, mildly tender in epigastric area but improved from yesterday's exam, no rebound, no guarding  Extremities: extremities normal, atraumatic, no cyanosis or edema  Skin: warm and dry   Disposition: Home or Self Care  Discharge Orders    Future Appointments: Provider: Department: Dept Phone:  Center:   01/03/2012 1:00 PM [facility] Dennie Bible Rm [facility] [phone] None     Medication List  As of 12/08/2011 11:33 PM   STOP taking these medications         famotidine 20 MG tablet         TAKE these medications         azithromycin 500 MG tablet   Commonly known as: ZITHROMAX   Take 1 tablet (500 mg total) by mouth daily.      clonazePAM 1 MG tablet   Commonly known as: KLONOPIN   Take 1 mg by mouth at bedtime.      clopidogrel 75 MG tablet   Commonly known as: PLAVIX   Take 75 mg by mouth daily.      diphenoxylate-atropine 2.5-0.025 MG per tablet   Commonly known as: LOMOTIL   Take 1-2 tablets by mouth 2 (two) times daily as needed. For diarrhea      erythromycin ophthalmic ointment   Place a 1/2 inch ribbon of ointment into the lower eyelid.      hydrocodone-acetaminophen  5-500 MG per capsule   Commonly known as: LORCET-HD   Take 1 capsule by mouth every 6 (six) hours as needed for pain. Do not take more than 6 per day      lipase/protease/amylase [address] UNITS Cpep   Commonly known as: CREON-10/PANCREASE   Take 2 capsules by mouth 3 (three) times daily with meals.      megestrol 40 MG/ML suspension   Commonly known as: MEGACE   (2 TEASPOONSFUL) BY MOUTH DAILY      metFORMIN 500 MG tablet   Commonly known as: GLUCOPHAGE   Take 500 mg by mouth daily.      mirtazapine 15 MG tablet   Commonly known as: REMERON   Take 7.5 mg by mouth at bedtime.      oxyCODONE-acetaminophen 5-325 MG per tablet   Commonly known as: PERCOCET   Take 1 tablet by mouth every 6 (six) hours as needed for pain.      phytonadione 5 MG tablet   Commonly known as: VITAMIN K   Take 2 tablets (10 mg total) by mouth daily.      ranitidine 150 MG tablet   Commonly known as: ZANTAC   Take 1 tablet (150 mg total) by mouth 2 (two) times daily.      Vitamin D-3 5000 UNITS Tabs   Take 1 tablet by mouth once a week.           Follow-up Information    Follow up with AVBUERE,EDWIN  A, MD. (follow up at your next appointment on 12/16/11)    Your Endoscopic Ultrasound will be on March 14th. You have a pre-procedure visit on the 12th of March and need to stop taking your plavix on March 8th 2013.     Follow up items: - plavix to be stopped 5 days before endoscopic ultrasound - patient's INR was elevated at 1.6. Patient received 2 doses of vit K 10mg . Consider repeat INR on follow up visit.   SignedMarena Chancy 12/08/2011, 11:33 PM

## 2011-12-09 NOTE — Discharge Summary (Signed)
Agree with DC summary and management as outlined by Dr. Gwenlyn Saran

## 2011-12-14 ENCOUNTER — Other Ambulatory Visit: Payer: Self-pay | Admitting: Sports Medicine

## 2011-12-14 NOTE — Telephone Encounter (Signed)
Refill request

## 2011-12-15 ENCOUNTER — Telehealth: Payer: Self-pay

## 2011-12-15 NOTE — Telephone Encounter (Signed)
Dr Christella Hartigan response was received from Dr Concepcion Elk regarding the pt's plavix.  He is only recommending a 3 day hold on the plavix is this ok?

## 2011-12-15 NOTE — Telephone Encounter (Signed)
Probably it will be ok

## 2011-12-15 NOTE — Telephone Encounter (Signed)
Pt has been notified of the recommendation for her plavix  Letter to be scanned into EPIC

## 2011-12-20 ENCOUNTER — Telehealth: Payer: Self-pay | Admitting: Gastroenterology

## 2011-12-20 NOTE — Telephone Encounter (Signed)
The pt says that she has no transportation and no one to stay with her during the procedure.  I did advise that she can be dropped off and someone can come back and pick her up.  She said that she will try and find someone and to leave the procedure as scheduled.  She will call back if she has any problems.

## 2011-12-28 ENCOUNTER — Encounter (HOSPITAL_COMMUNITY): Payer: Self-pay | Admitting: *Deleted

## 2012-01-03 ENCOUNTER — Other Ambulatory Visit (HOSPITAL_COMMUNITY): Payer: Medicaid Other

## 2012-01-05 ENCOUNTER — Ambulatory Visit (HOSPITAL_COMMUNITY)
Admission: RE | Admit: 2012-01-05 | Discharge: 2012-01-05 | Disposition: A | Discharge status: Home Health | Payer: Medicaid Other | Source: Ambulatory Visit | Attending: Gastroenterology | Admitting: Gastroenterology

## 2012-01-05 ENCOUNTER — Ambulatory Visit (HOSPITAL_COMMUNITY): Payer: Medicaid Other | Admitting: Registered Nurse

## 2012-01-05 ENCOUNTER — Encounter (HOSPITAL_COMMUNITY): Payer: Self-pay | Admitting: Gastroenterology

## 2012-01-05 ENCOUNTER — Encounter (HOSPITAL_COMMUNITY): Payer: Self-pay | Admitting: Registered Nurse

## 2012-01-05 ENCOUNTER — Encounter (HOSPITAL_COMMUNITY)
Admission: RE | Disposition: A | Discharge status: Home Health | Payer: Self-pay | Source: Ambulatory Visit | Attending: Gastroenterology

## 2012-01-05 ENCOUNTER — Encounter (HOSPITAL_COMMUNITY): Payer: Self-pay | Admitting: *Deleted

## 2012-01-05 DIAGNOSIS — E119 Type 2 diabetes mellitus without complications: Secondary | ICD-10-CM | POA: Insufficient documentation

## 2012-01-05 DIAGNOSIS — K838 Other specified diseases of biliary tract: Secondary | ICD-10-CM | POA: Insufficient documentation

## 2012-01-05 DIAGNOSIS — J189 Pneumonia, unspecified organism: Secondary | ICD-10-CM | POA: Insufficient documentation

## 2012-01-05 DIAGNOSIS — Z7902 Long term (current) use of antithrombotics/antiplatelets: Secondary | ICD-10-CM | POA: Insufficient documentation

## 2012-01-05 DIAGNOSIS — K7689 Other specified diseases of liver: Secondary | ICD-10-CM | POA: Insufficient documentation

## 2012-01-05 DIAGNOSIS — K831 Obstruction of bile duct: Secondary | ICD-10-CM

## 2012-01-05 DIAGNOSIS — K861 Other chronic pancreatitis: Secondary | ICD-10-CM | POA: Insufficient documentation

## 2012-01-05 DIAGNOSIS — R7989 Other specified abnormal findings of blood chemistry: Secondary | ICD-10-CM | POA: Insufficient documentation

## 2012-01-05 DIAGNOSIS — F1021 Alcohol dependence, in remission: Secondary | ICD-10-CM | POA: Insufficient documentation

## 2012-01-05 HISTORY — DX: Pneumonia, unspecified organism: J18.9

## 2012-01-05 HISTORY — PX: EUS: SHX5427

## 2012-01-05 HISTORY — DX: Gastro-esophageal reflux disease without esophagitis: K21.9

## 2012-01-05 LAB — GLUCOSE, CAPILLARY
Glucose-Capillary: 72 mg/dL (ref 70–99)
Glucose-Capillary: 70 mg/dL (ref 70–99)

## 2012-01-05 SURGERY — UPPER ENDOSCOPIC ULTRASOUND (EUS) LINEAR
Anesthesia: Monitor Anesthesia Care

## 2012-01-05 MED ORDER — BUTAMBEN-TETRACAINE-BENZOCAINE 2-2-14 % EX AERO
INHALATION_SPRAY | CUTANEOUS | Status: DC | PRN
  Administered 2012-01-05: 2 via TOPICAL

## 2012-01-05 MED ORDER — MIDAZOLAM HCL 5 MG/5ML IJ SOLN
INTRAMUSCULAR | Status: DC | PRN
  Administered 2012-01-05: 2 mg via INTRAVENOUS

## 2012-01-05 MED ORDER — KETAMINE HCL 10 MG/ML IJ SOLN
INTRAMUSCULAR | Status: DC | PRN
  Administered 2012-01-05 (×3): 10 mg via INTRAVENOUS

## 2012-01-05 MED ORDER — LIDOCAINE HCL (CARDIAC) 20 MG/ML IV SOLN
INTRAVENOUS | Status: DC | PRN
  Administered 2012-01-05: 100 mg via INTRAVENOUS

## 2012-01-05 MED ORDER — PROPOFOL 10 MG/ML IV EMUL
INTRAVENOUS | Status: DC | PRN
  Administered 2012-01-05: 100 ug/kg/min via INTRAVENOUS

## 2012-01-05 MED ORDER — FENTANYL CITRATE 0.05 MG/ML IJ SOLN
INTRAMUSCULAR | Status: DC | PRN
  Administered 2012-01-05: 50 ug via INTRAVENOUS

## 2012-01-05 MED ORDER — LACTATED RINGERS IV SOLN
INTRAVENOUS | Status: DC | PRN
  Administered 2012-01-05: 08:00:00 via INTRAVENOUS

## 2012-01-05 NOTE — H&P (View-Only) (Signed)
     [facility] Daily Rounding Note 12/06/2011, 9:22 AM  SUBJECTIVE: Eating 100% of meals.  Abd pain improved but still using Morphine injection prn.  Used twice yesterday, once this AM.  Stools loose but not frequent.  She says pepcid and Nexium both cause loose stools.   Feels ready for discharge.  OBJECTIVE: General: looks well  Vital signs in last 24 hours: Temp:  [97.9 F (36.6 C)-99.4 F (37.4 C)] 97.9 F (36.6 C) (02/12 0500) Pulse Rate:  [75-92] 75  (02/12 0500) Resp:  [20] 20  (02/12 0500) BP: (105-130)/(79-93) 130/80 mmHg (02/12 0500) SpO2:  [97 %-98 %] 98 % (02/12 0500) Weight:  [175 lb 9.6 oz (79.652 kg)] 175 lb 9.6 oz (79.652 kg) (02/12 0500) Last BM Date: 12/04/11  Heart: RRR.  No MRG. Chest: Clear, diminished BS on right base.  No cough or dyspnea. Abdomen: Soft, minor upper abdominal tenderness  Extremities: no edema Neuro/Psych:   Pleasant, relaxed, appropriate.  Intake/Output from previous day: 02/11 0701 - 02/12 0700 In: 2891 [P.O.:940; I.V.:1651; IV Piggyback:300] Out: -   Intake/Output this shift:    Lab Results:  Basename 12/05/11 0618 12/04/11 0700  WBC 7.4 7.6  HGB 12.0 12.1  HCT 35.0* 35.5*  PLT 306 370   BMET  Basename 12/05/11 0618 12/04/11 0700  NA 137 136  K 4.1 3.7  CL 108 106  CO2 18* 20  GLUCOSE 95 94  BUN 5* 6  CREATININE 0.58 0.62  CALCIUM 9.1 9.3   LFT  Basename 12/05/11 0618  PROT 6.6  ALBUMIN 2.8*  AST 69*  ALT 70*  ALKPHOS 289*  BILITOT 0.6  BILIDIR --  IBILI --   PT/INR  Basename 12/05/11 0618  LABPROT 20.0*  INR 1.67*   ASSESMENT: 1. Acute on chronic pancreatitis. CBD and PD stricture on imaging. Need to determine if this is secondary to her chronic pancreatitis (most likely) or if there is malignancy present, though no mass seen on CT scan/MRCP. On creon for pancreatic insufficiency. On megace for anorexia PTA.  In process of arranging outpatient EUS, off Plavix. Abdominal pain much improved, may  need limited supply of po narcotics at discharge for prn use. 2. Fatty liver.  3. Comm acquired pneumonia, on azithromycin and rocephin  4. DM 2. No insulin PTA.  5. Chronic Plavix, not currently held. Coronary stent in place. Called Nada Boozer, [facility] nurse practioner, who will research and advise re: holding Plavix 6. Hx alcolholism. Sober for 3 years. 7.  Loose stools, Creon dose was increased and diarrhea so far improved.   PLAN: 1.  Home today? 2.  Arrangements for out pt EUS in progress. 3.  May want to try ranitidine instead of famotidine as outpt, see if this has less tendency to induce diarrhea.     LOS: 4 days   [name] Foley  12/06/2011, 9:22 AM Pager: [phone]

## 2012-01-05 NOTE — Op Note (Signed)
[facility] [address] [place], [place]  [address]  ENDOSCOPIC ULTRASOUND PROCEDURE REPORT  PATIENT:  Foley Foley  MR#:  [id number] BIRTHDATE:  1946/08/27  GENDER:  female ENDOSCOPIST:  Rachael Fee, MD REFERRED BY:  Foley Foley, M.D., [facility] PROCEDURE DATE:  01/05/2012 PROCEDURE:  Upper EUS ASA CLASS:  Class III INDICATIONS:  known etoh related chronic pancreatitis, last drink several years ago; CBD increased dilation and liver test elevation over past several months MEDICATIONS:   MAC sedation, administered by CRNA DESCRIPTION OF PROCEDURE:   After the risks benefits and alternatives of the procedure were  explained, informed consent was obtained. The patient was then placed in the left, lateral, decubitus postion and IV sedation was administered. Throughout the procedure, the patient's blood pressure, pulse and oxygen saturations were monitored continuously.  Under direct visualization, the ERCP 110520 endoscope was introduced through the mouth and advanced to the second portion of the duodenum. Water was used as necessary to provide an acoustic interface.  Upon completion of the imaging, water was removed and the patient was sent to the recovery room in satisfactory condition. <<PROCEDUREIMAGES>> Endoscopic findings (with radial echoendoscope and side viewing duodenoscope): 1. Normal esophagus 2. Moderate amount of retained solid and liquid food in stomach 3.Normal major papilla  EUS findings: 1. CBD was dilated up to 1.1cm without clear filling defects, tapered smoothly into major papilla. 2. Pancreatic parenchyma was nearly completely replated with shadowing calcifications which signficantly decreased the sensitivity to find neoplastic lesions.  No clear solid masses. 3. Main pancreatic duct was ectatic, dilated,probably contains calcifications within the duct.  Pancreatic duct measured 7mm maximally. 4. No peripancreatic adenopathy. 5.  Limited views of liver, spleen, portal and splenic vessels were all normal  Impression: Severe chronic pancreatitis with extensive calcifications throughout the pancreas. No solid masses were noted, however the changes of severe chronic pancreatitis signficantly decrease the ability to detect neoplastic lesions.  The CBD and main pancreatic duct were dilated. No stones in CBD but there are likely calcifications within the pacreatic duct.  Given LFT elevation (elevated alk phos, mild transaminase elevation), could consider ERCP (tertiary biliary center referral?).  ______________________________ Rachael Fee, MD  n. eSIGNED:   Rachael Fee at 01/05/2012 08:34 AM  Benay Foley, Foley, [phone]

## 2012-01-05 NOTE — Discharge Instructions (Signed)
YOU HAD AN ENDOSCOPIC PROCEDURE TODAY: Refer to the procedure report that was given to you for any specific questions about what was found during the examination.  If the procedure report does not answer your questions, please call your gastroenterologist to clarify.  YOU SHOULD EXPECT: Some feelings of bloating in the abdomen. Passage of more gas than usual.  Walking can help get rid of the air that was put into your GI tract during the procedure and reduce the bloating. If you had a lower endoscopy (such as a colonoscopy or flexible sigmoidoscopy) you may notice spotting of blood in your stool or on the toilet paper.   DIET: Your first meal following the procedure should be a light meal and then it is ok to progress to your normal diet.  A half-sandwich or bowl of soup is an example of a good first meal.  Heavy or fried foods are harder to digest and may make you feel nasueas or bloated.  Drink plenty of fluids but you should avoid alcoholic beverages for 24 hours.  ACTIVITY: Your care partner should take you home directly after the procedure.  You should plan to take it easy, moving slowly for the rest of the day.  You can resume normal activity the day after the procedure however you should NOT DRIVE or use heavy machinery for 24 hours (because of the sedation medicines used during the test).    SYMPTOMS TO REPORT IMMEDIATELY  A gastroenterologist can be reached at any hour.  Please call your doctor's office for any of the following symptoms:   Following lower endoscopy (colonoscopy, flexible sigmoidoscopy)  Excessive amounts of blood in the stool  Significant tenderness, worsening of abdominal pains  Swelling of the abdomen that is new, acute  Fever of 100 or higher  Following upper endoscopy (EGD, EUS, ERCP)  Vomiting of blood or coffee ground material  New, significant abdominal pain  New, significant chest pain or pain under the shoulder blades  Painful or persistently difficult  swallowing  New shortness of breath  Black, tarry-looking stools  

## 2012-01-05 NOTE — Preoperative (Signed)
Beta Blockers   Reason not to administer Beta Blockers:Not Applicable 

## 2012-01-05 NOTE — Transfer of Care (Signed)
Immediate Anesthesia Transfer of Care Note  Patient: [name] Foley  Procedure(s) Performed: Procedure(s) (LRB): UPPER ENDOSCOPIC ULTRASOUND (EUS) LINEAR (N/A)  Patient Location: Endoscopy Unit  Anesthesia Type: MAC  Level of Consciousness: awake, alert , oriented and patient cooperative  Airway & Oxygen Therapy: Patient Spontanous Breathing and Patient connected to nasal cannula oxygen  Post-op Assessment: Report given to PACU RN, Post -op Vital signs reviewed and stable and Patient moving all extremities  Post vital signs: Reviewed and stable  Complications: No apparent anesthesia complications

## 2012-01-05 NOTE — Anesthesia Postprocedure Evaluation (Signed)
  Anesthesia Post-op Note  Patient: [name] Foley  Procedure(s) Performed: Procedure(s) (LRB): UPPER ENDOSCOPIC ULTRASOUND (EUS) LINEAR (N/A)  Patient Location: PACU  Anesthesia Type: MAC  Level of Consciousness: oriented and sedated  Airway and Oxygen Therapy: Patient Spontanous Breathing  Post-op Pain: mild  Post-op Assessment: Post-op Vital signs reviewed, Patient's Cardiovascular Status Stable, Respiratory Function Stable and Patent Airway  Post-op Vital Signs: stable  Complications: No apparent anesthesia complications

## 2012-01-05 NOTE — Anesthesia Preprocedure Evaluation (Addendum)
Anesthesia Evaluation  Patient identified by MRN, date of birth, ID band Patient awake    Reviewed: Allergy & Precautions, H&P , NPO status , Patient's Chart, lab work & pertinent test results, reviewed documented beta blocker date and time   Airway Mallampati: II TM Distance: >3 FB Neck ROM: Full    Dental  (+) Upper Dentures and Lower Dentures   Pulmonary Current Smoker,  breath sounds clear to auscultation        Cardiovascular + CAD Rhythm:Regular Rate:Normal  CAD, s/p PTCA w/ stent 20007 Rare CP Denies hx of HTN   Neuro/Psych negative neurological ROS  negative psych ROS   GI/Hepatic Neg liver ROS, Hx pancreatitis ABD pain   Endo/Other  Diabetes mellitus-, Well Controlled, Type 2Took AM DM med  Renal/GU negative Renal ROS  negative genitourinary   Musculoskeletal negative musculoskeletal ROS (+)   Abdominal   Peds negative pediatric ROS (+)  Hematology negative hematology ROS (+)   Anesthesia Other Findings   Reproductive/Obstetrics negative OB ROS                           Anesthesia Physical Anesthesia Plan  ASA: III  Anesthesia Plan: MAC   Post-op Pain Management:    Induction: Intravenous  Airway Management Planned: Mask  Additional Equipment:   Intra-op Plan:   Post-operative Plan:   Informed Consent: I have reviewed the patients History and Physical, chart, labs and discussed the procedure including the risks, benefits and alternatives for the proposed anesthesia with the patient or authorized representative who has indicated his/her understanding and acceptance.     Plan Discussed with: CRNA and Surgeon  Anesthesia Plan Comments:         Anesthesia Quick Evaluation

## 2012-01-05 NOTE — Interval H&P Note (Signed)
History and Physical Interval Note:  01/05/2012 7:28 AM  [name] Foley  has presented today for surgery, with the diagnosis of Bile duct abnormality [576.9] Chronic pancreatitis [577.1]  The various methods of treatment have been discussed with the patient and family. After consideration of risks, benefits and other options for treatment, the patient has consented to  Procedure(s) (LRB): UPPER ENDOSCOPIC ULTRASOUND (EUS) LINEAR (N/A) as a surgical intervention .  The patients' history has been reviewed, patient examined, no change in status, stable for surgery.  I have reviewed the patients' chart and labs.  Questions were answered to the patient's satisfaction.     Rob Bunting

## 2012-01-06 ENCOUNTER — Encounter (HOSPITAL_COMMUNITY): Payer: Self-pay | Admitting: Gastroenterology

## 2012-01-12 ENCOUNTER — Telehealth: Payer: Self-pay

## 2012-01-12 NOTE — Telephone Encounter (Signed)
Patient is scheduled for 02/03/12.  She is aware of the appt

## 2012-01-12 NOTE — Telephone Encounter (Signed)
Message copied by Annett Fabian on Thu Jan 12, 2012  9:35 AM ------      Message from: Stan Head E      Created: Thu Jan 12, 2012  8:08 AM       Please arrange for her to have an office visit with me            Dx: Chronic pancreatitis      ----- Message -----         From: Meryl Dare, MD,FACG         Sent: 01/06/2012   3:00 PM           To: Rachael Fee, MD, Iva Boop, MD            That sounds like a reasonable plan. I will forward to [facility] for his review and plans as he has seen her previously for colonoscopy. I saw during her recent hospital stay.            ----- Message -----         From: Rachael Fee, MD         Sent: 01/05/2012   8:35 AM           To: Meryl Dare, MD,FACG, Beverley Fiedler, MD            Just finished EUS:            Severe chronic pancreatitis with extensive calcifications throughout the pancreas. No solid masses were noted, however the changes of severe chronic pancreatitis signficantly decrease the ability to detect neoplastic lesions.  The CBD and main pancreatic duct were dilated. No stones in CBD but there are likely calcifications within the pacreatic duct.  Given LFT elevation (elevated alk phos, mild transaminase elevation), could consider ERCP (tertiary biliary center referral?).

## 2012-02-03 ENCOUNTER — Encounter: Payer: Self-pay | Admitting: Internal Medicine

## 2012-02-03 ENCOUNTER — Ambulatory Visit (INDEPENDENT_AMBULATORY_CARE_PROVIDER_SITE_OTHER): Payer: Medicaid Other | Admitting: Internal Medicine

## 2012-02-03 ENCOUNTER — Other Ambulatory Visit (INDEPENDENT_AMBULATORY_CARE_PROVIDER_SITE_OTHER): Payer: Medicaid Other

## 2012-02-03 VITALS — BP 130/64 | HR 84 | Ht 62.0 in | Wt 180.8 lb

## 2012-02-03 DIAGNOSIS — K861 Other chronic pancreatitis: Secondary | ICD-10-CM

## 2012-02-03 DIAGNOSIS — K831 Obstruction of bile duct: Secondary | ICD-10-CM

## 2012-02-03 DIAGNOSIS — R197 Diarrhea, unspecified: Secondary | ICD-10-CM

## 2012-02-03 LAB — HEPATIC FUNCTION PANEL
AST: 106 U/L — ABNORMAL HIGH (ref 0–37)
Albumin: 4 g/dL (ref 3.5–5.2)
Alkaline Phosphatase: 282 U/L — ABNORMAL HIGH (ref 39–117)
Total Bilirubin: 0.3 mg/dL (ref 0.3–1.2)

## 2012-02-03 MED ORDER — PANCRELIPASE (LIP-PROT-AMYL) 25000 UNITS PO CPEP: 1.0000 | ORAL_CAPSULE | Freq: Three times a day (TID) | ORAL | Status: DC

## 2012-02-03 NOTE — Assessment & Plan Note (Signed)
This appears to be undertreated with respect to her pancreatic enzyme supplementation. Increasing enzyme supplementation to 25,000 units with meals and snacks.

## 2012-02-03 NOTE — Progress Notes (Signed)
Subjective:    Patient ID: [name] Foley, female    DOB: 04-Apr-1946, 66 y.o.   MRN: [id number]  HPI This pleasant middle-aged [place] American woman presents for followup of chronic pancreatitis with diarrhea and biliary stricture. She developed a flare of her pancreatitis with abdominal pain and was admitted to the hospital recently. Imaging detected a dilated common bile duct, I CT scan as well as MRI which showed a stricture in the distal bile duct. No pancreatic mass was seen. She went onto endoscopic ultrasound which did not reveal any pancreatic tumors or cancers though the sensitivity for this in the setting of her chronic pancreatitis is reduced.  Today she reports she is having persistent loose stools for the most part. Frequent defecation, usually after eating but is yellow in color. She cannot tell me whether she sees oil droplets or not. In the last week or so she's actually been having some nocturnal stools as well. She does not have incontinence but says she has a hurry to the bathroom. She denies any abdominal pain. She is not losing weight. 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 She was to be on Creon 12,000 units 2 with meals but is only taking one. He also had Lomotil on her list but is not using that, actually doesn't have it and says that it never worked.  I had not seen her since 2011, she had an incomplete or poor prep colonoscopy at that time because of this the area and adherent stool.  She tells me her problems with diarrhea really began many years ago after a cholecystectomy.  Medications, allergies, past medical history, past surgical history, family history and social history are reviewed and updated in the EMR.   Review of Systems As per history of present illness    Objective:   Physical Exam General:  NAD - obese Eyes:   anicteric Lungs:  clear Heart:  S1S2 no rubs, murmurs or gallops Abdomen:  soft and mildly tender eoigastrium, BS+ Ext:   no  edema    Data Reviewed:   CT and MRI scans, endoscopic ultrasound reports.        Assessment & Plan:   1. Chronic pancreatitis   Her pancreatic insufficiency related to this appears to be undertreated. I'm going to increase her pancreatic enzyme supplementation by changing to Zenpep 25,000, one with meals and snacks. I will follow up in 3 weeks.   2. Common bile duct stricture   My suspicion is this is most likely benign. However pancreatic malignancy causing a bile duct stricture is in the differential diagnosis. I will check a CA 19-9 today. I have discussed the possibility of ERCP with the patient briefly. That is reasonable and appropriate, I want to try to control her steatorrhea more first and await the CA 19-9 and we will make a decision about how to proceed towards ERCP in the near future. Recheck LFTs today. Plavix would need to be held prior to an ERCP.   Note that the stricture is not symptomatic at this point.   3. Diarrhea   Interesting that she reports she had this after cholecystectomy. I think is somewhat unusual for pancreatic insufficiency because of diarrhea in the middle of the night without eating well as possible. I'm going to check a C. differential PCR given recent hospitalization she may have more than one cause of her diarrhea. We'll await the response to pancreatic enzyme increase, and also consider additional therapy likely start in depending  upon the clinical course.   Note that her previous colonoscopy was in adequate. I don't think it's a priority to repeat that at this point but we'll keep that in mind depending upon her clinical course. She has not had an adequate screening examination. Need to sort out these other issues before proceeding with any repeat attempt at screening colonoscopy.    Cc: Dorrene German, MD

## 2012-02-03 NOTE — Patient Instructions (Addendum)
Your physician has requested that you go to the basement for the following lab work before leaving today: Hepatic Function Test, CA-19-9, and a C-diff. PCR  Stop your Creon medicine and your new medicine is Zenpep--Take 1 capsule three times a day with meals and 1 with snacks.  Follow-up with [place] in 3 weeks.

## 2012-02-03 NOTE — Assessment & Plan Note (Signed)
2 check CA 19-9, recheck LFTs. Likely to have ERCP at some point, would need to hold Plavix.

## 2012-02-24 ENCOUNTER — Telehealth: Payer: Self-pay | Admitting: Internal Medicine

## 2012-02-24 MED ORDER — DIPHENOXYLATE-ATROPINE 2.5-0.025 MG PO TABS: ORAL_TABLET | ORAL | Status: DC

## 2012-02-24 NOTE — Telephone Encounter (Signed)
Spoke with patient and she states she cannot come to OV because she does not have transportation. Please, advise

## 2012-02-24 NOTE — Telephone Encounter (Signed)
Patient states she is having right side abdominal pain that is below belly button. States the pain started yesterday and she rates it an "8". Denies nausea or vomiting. States she is having diarrhea- too many to count in a day. Denies bleeding. States she is taking Imodium and is taking her Pancrelipase. Patient is asking for pain medication. Please, advise.

## 2012-02-24 NOTE — Telephone Encounter (Signed)
Spoke with patient and gave her Dr. Marvell Fuller recommendations. She states she does not have ride to come in next week. She understands to go to ED if fever or feels worse. Rx sent. Patient understands diet and to push fluids.

## 2012-02-24 NOTE — Telephone Encounter (Signed)
She needs to be seen by someone (? Amy this PM) given the frequent stools - ? If she could have C diff?

## 2012-02-24 NOTE — Telephone Encounter (Signed)
Let's prescribe Lomotil 1-2 every 6 hours as needed for pain and diarrhea #60 no refills She needs to get into see me next week and go to urgent care or ED if fevers, deterioriation before then She should be on a liquid-BRAT diet and force fluids

## 2012-02-28 ENCOUNTER — Ambulatory Visit: Payer: Medicaid Other | Admitting: Internal Medicine

## 2012-03-12 ENCOUNTER — Other Ambulatory Visit (INDEPENDENT_AMBULATORY_CARE_PROVIDER_SITE_OTHER): Payer: Medicaid Other

## 2012-03-12 ENCOUNTER — Ambulatory Visit (INDEPENDENT_AMBULATORY_CARE_PROVIDER_SITE_OTHER): Payer: Medicaid Other | Admitting: Internal Medicine

## 2012-03-12 ENCOUNTER — Encounter: Payer: Self-pay | Admitting: Internal Medicine

## 2012-03-12 VITALS — BP 132/68 | HR 80 | Ht 62.0 in | Wt 177.0 lb

## 2012-03-12 DIAGNOSIS — K861 Other chronic pancreatitis: Secondary | ICD-10-CM

## 2012-03-12 DIAGNOSIS — R197 Diarrhea, unspecified: Secondary | ICD-10-CM

## 2012-03-12 DIAGNOSIS — K529 Noninfective gastroenteritis and colitis, unspecified: Secondary | ICD-10-CM

## 2012-03-12 DIAGNOSIS — K831 Obstruction of bile duct: Secondary | ICD-10-CM

## 2012-03-12 LAB — HEPATIC FUNCTION PANEL
ALT: 49 U/L — ABNORMAL HIGH (ref 0–35)
Total Bilirubin: 0.4 mg/dL (ref 0.3–1.2)
Total Protein: 7.3 g/dL (ref 6.0–8.3)

## 2012-03-12 MED ORDER — PEG-KCL-NACL-NASULF-NA ASC-C 100 G PO SOLR: 1.0000 | Freq: Once | ORAL | Status: DC

## 2012-03-12 MED ORDER — DIPHENOXYLATE-ATROPINE 2.5-0.025 MG PO TABS: ORAL_TABLET | ORAL | Status: DC

## 2012-03-12 MED ORDER — PANCRELIPASE (LIP-PROT-AMYL) 12000-38000 UNITS PO CPEP: 2.0000 | ORAL_CAPSULE | Freq: Three times a day (TID) | ORAL | Status: DC

## 2012-03-12 NOTE — Patient Instructions (Signed)
Your physician has requested that you go to the basement for the following lab work before leaving today: LFT's, stool fecal fat  We have sent the following medications to your pharmacy for you to pick up at your convenience: Lomotil  You have been scheduled for a colonoscopy with propofol. Please follow written instructions given to you at your visit today.  Please pick up your prep kit at the pharmacy within the next 1-3 days.  You will be contaced by our office prior to your procedure for directions on holding your Plavix.  If you do not hear from our office 1 week prior to your scheduled procedure, please call [phone] to discuss.

## 2012-03-12 NOTE — Progress Notes (Signed)
Subjective:    Patient ID: [name] Foley, female    DOB: 1946/07/04, 66 y.o.   MRN: [id number]  HPI Patient returns for followup of chronic pancreatitis, chronic diarrhea, and a bile duct stricture related to her chronic pancreatitis. When she was last here I changed her pancreatic enzyme supplement to Zen Pep. She reports that that caused an increase in abdominal pain and cramping. She has since also been started on Lomotil 1-2 every 6 hours as needed for cramps and pain with some benefit and is asking for refill. She had called in for that. She continues to have 4 or more bowel movements a day, often 6 or 8, with nocturnal stools. There more she. She says they are brown. She think she might be a little better than when I saw her previously. She is concerned that ranitidine might be causing her diarrhea and wonders also she has irritable bowel syndrome.  She denies abdominal pain at this time other than some cramps associated with the diarrhea.  Wt Readings from Last 3 Encounters:  03/12/12 177 lb (80.287 kg)  02/03/12 180 lb 12.8 oz (82.01 kg)  01/05/12 177 lb (80.287 kg)   Medications, allergies, past medical history, past surgical history, family history and social history are reviewed and updated in the EMR.  Review of Systems As per history of present illness. She does take Plavix for coronary artery disease.  Objective:   Physical Exam General:  NAD, overweight Eyes:   Muddy sclera Lungs:  clear Heart:  S1S2 no rubs, murmurs or gallops Abdomen:  soft , BS+, there is a small nodule superior and to the right of the umbilicus that is mildly tender in reducible, question small ventral hernia, otherwise the abdomen is obese soft and nontender without organomegaly or mass Psych:  Appropriate mood and affect    Data Reviewed:    Chemistry      Component Value Date/Time   NA 137 12/05/2011 0618   K 4.1 12/05/2011 0618   CL 108 12/05/2011 0618   CO2 18* 12/05/2011 0618   BUN 5*  12/05/2011 0618   CREATININE 0.58 12/05/2011 0618      Component Value Date/Time   CALCIUM 9.1 12/05/2011 0618   ALKPHOS 282* 02/03/2012 1146   AST 106* 02/03/2012 1146   ALT 107* 02/03/2012 1146   BILITOT 0.3 02/03/2012 1146         Assessment & Plan:   1. Chronic pancreatitis   She was thought to have steatorrhea, she is not losing weight so apparently she is on adequate supplementation of pancreatic enzymes. I am puzzled as to why the Zen Pep cause abdominal pain, question if true true unrelated as opposed to cause-and-effect. She is back on Creon. Await fecal fat test, could consider stool for fecal elastase as well.   2. Chronic diarrhea   Causes not clear. Persistent problems. Check qualitative fecal fat today. She could need a 72 hour stool study. Schedule colonoscopy with biopsies, given Plavix use will ask if she can hold that for 5-7 days. Will contact Dr. Allyson Sabal her cardiologist. The risks and benefits as well as alternatives of endoscopic procedure(s) have been discussed and reviewed. All questions answered. The patient agrees to proceed. Propofol sedation, she did not tolerate moderate sedation well in the past, she also was not well prepped at that time either.   3. Common bile duct stricture   Recheck liver function tests today. Level of alkaline phosphatase some bearing upon whether or not  biliary stenting is undertaken by ERCP. She says she has no good wait to get to a tertiary center.    CC: Dorrene German, MD and Nanetta Batty M.D.

## 2012-03-21 ENCOUNTER — Telehealth: Payer: Self-pay

## 2012-03-21 NOTE — Telephone Encounter (Signed)
Advised pt that per Dr. Jeri Cos pt may hold Plavix prior to colonoscopy.  Told her to hold it starting 03/29/12, procedure 04/03/12.  Pt verbalized understanding.

## 2012-03-23 ENCOUNTER — Telehealth: Payer: Self-pay | Admitting: Internal Medicine

## 2012-03-23 NOTE — Telephone Encounter (Signed)
All questions answered about sedation.  She will call back for any additional questions

## 2012-03-24 DIAGNOSIS — D126 Benign neoplasm of colon, unspecified: Secondary | ICD-10-CM

## 2012-03-24 HISTORY — DX: Benign neoplasm of colon, unspecified: D12.6

## 2012-04-03 ENCOUNTER — Encounter: Payer: Medicaid Other | Admitting: Internal Medicine

## 2012-04-03 ENCOUNTER — Ambulatory Visit (AMBULATORY_SURGERY_CENTER): Payer: Medicaid Other | Admitting: Internal Medicine

## 2012-04-03 ENCOUNTER — Encounter: Payer: Self-pay | Admitting: Internal Medicine

## 2012-04-03 VITALS — BP 128/92 | HR 85 | Temp 97.1°F | Resp 16 | Ht 62.0 in | Wt 177.0 lb

## 2012-04-03 DIAGNOSIS — R197 Diarrhea, unspecified: Secondary | ICD-10-CM

## 2012-04-03 DIAGNOSIS — K861 Other chronic pancreatitis: Secondary | ICD-10-CM

## 2012-04-03 DIAGNOSIS — D126 Benign neoplasm of colon, unspecified: Secondary | ICD-10-CM

## 2012-04-03 DIAGNOSIS — K648 Other hemorrhoids: Secondary | ICD-10-CM

## 2012-04-03 LAB — GLUCOSE, CAPILLARY
Glucose-Capillary: 102 mg/dL — ABNORMAL HIGH (ref 70–99)
Glucose-Capillary: 95 mg/dL (ref 70–99)

## 2012-04-03 MED ORDER — PANCRELIPASE (LIP-PROT-AMYL) 12000-38000 UNITS PO CPEP: 2.0000 | ORAL_CAPSULE | Freq: Three times a day (TID) | ORAL | Status: DC

## 2012-04-03 MED ORDER — DIPHENOXYLATE-ATROPINE 2.5-0.025 MG PO TABS: ORAL_TABLET | ORAL | Status: DC

## 2012-04-03 MED ORDER — SODIUM CHLORIDE 0.9 % IV SOLN: 500.0000 mL | INTRAVENOUS | Status: DC

## 2012-04-03 NOTE — Patient Instructions (Addendum)
There was a tiny polyp removed, and hemorrhoids seen. Your Creon and Lomotil  was refilled. I will call with results of biopsies and plans. Iva Boop, MD, FACG  YOU HAD AN ENDOSCOPIC PROCEDURE TODAY AT THE [facility]: Refer to the procedure report that was given to you for any specific questions about what was found during the examination.  If the procedure report does not answer your questions, please call your gastroenterologist to clarify.  If you requested that your care partner not be given the details of your procedure findings, then the procedure report has been included in a sealed envelope for you to review at your convenience later.  YOU SHOULD EXPECT: Some feelings of bloating in the abdomen. Passage of more gas than usual.  Walking can help get rid of the air that was put into your GI tract during the procedure and reduce the bloating. If you had a lower endoscopy (such as a colonoscopy or flexible sigmoidoscopy) you may notice spotting of blood in your stool or on the toilet paper. If you underwent a bowel prep for your procedure, then you may not have a normal bowel movement for a few days.  DIET: Your first meal following the procedure should be a light meal and then it is ok to progress to your normal diet.  A half-sandwich or bowl of soup is an example of a good first meal.  Heavy or fried foods are harder to digest and may make you feel nauseous or bloated.  Likewise meals heavy in dairy and vegetables can cause extra gas to form and this can also increase the bloating.  Drink plenty of fluids but you should avoid alcoholic beverages for 24 hours.  ACTIVITY: Your care partner should take you home directly after the procedure.  You should plan to take it easy, moving slowly for the rest of the day.  You can resume normal activity the day after the procedure however you should NOT DRIVE or use heavy machinery for 24 hours (because of the sedation medicines used during  the test).    SYMPTOMS TO REPORT IMMEDIATELY: A gastroenterologist can be reached at any hour.  During normal business hours, 8:30 AM to 5:00 PM Monday through Friday, call [phone].  After hours and on weekends, please call the GI answering service at [phone] who will take a message and have the physician on call contact you.   Following lower endoscopy (colonoscopy or flexible sigmoidoscopy):  Excessive amounts of blood in the stool  Significant tenderness or worsening of abdominal pains  Swelling of the abdomen that is new, acute  Fever of 100F or higher  Following upper endoscopy (EGD)  Vomiting of blood or coffee ground material  New chest pain or pain under the shoulder blades  Painful or persistently difficult swallowing  New shortness of breath  Fever of 100F or higher  Black, tarry-looking stools  FOLLOW UP: If any biopsies were taken you will be contacted by phone or by letter within the next 1-3 weeks.  Call your gastroenterologist if you have not heard about the biopsies in 3 weeks.  Our staff will call the home number listed on your records the next business day following your procedure to check on you and address any questions or concerns that you may have at that time regarding the information given to you following your procedure. This is a courtesy call and so if there is no answer at the home number and we  have not heard from you through the emergency physician on call, we will assume that you have returned to your regular daily activities without incident.  SIGNATURES/CONFIDENTIALITY: You and/or your care partner have signed paperwork which will be entered into your electronic medical record.  These signatures attest to the fact that that the information above on your After Visit Summary has been reviewed and is understood.  Full responsibility of the confidentiality of this discharge information lies with you and/or your care-partner.   Please follow all  discharge instructions given to you by the recovery room nurse. If you have any questions or problems after discharge please call one of the numbers listed above. You will receive a phone call in the am to see how you are doing and answer any questions you may have. Thank you for choosing [facility] for your health care needs.

## 2012-04-03 NOTE — Progress Notes (Signed)
Patient did not experience any of the following events: a burn prior to discharge; a fall within the facility; wrong site/side/patient/procedure/implant event; or a hospital transfer or hospital admission upon discharge from the facility. (G8907) Patient did not have preoperative order for IV antibiotic SSI prophylaxis. (G8918)  

## 2012-04-03 NOTE — Progress Notes (Signed)
1350PATIENT IN LEFT LATERAL POSITION,BEGAN TO VOMIT LA LARGE VOLUME OF GREENISH CLEAR VOMITUS. PATIENT ABLE TO TO COUGH. PATIENT IMMEDIATELY PLACED TIN TRENDELENBURG POSITION AND ORAL SUCTION YIELDING APPROXIMATELY 200 ML IN THE CANNISTER, ESTIMATED ANOTHER 100 ML PLUS ON THE STRETCHER. COMPLETED CASE, PATIENT ALERT, TRANSFERRED TO CLEAN STRETCHER WITH MINIMAL ASSISTANCE, AIRWAY CLEAR, NO COUGH OR DYSPNEA.

## 2012-04-03 NOTE — Op Note (Signed)
[facility] [address] [place], [place]  [address]  COLONOSCOPY PROCEDURE REPORT  PATIENT:  [name] Foley, [name] Foley  MR#:  [id number] BIRTHDATE:  23-Dec-1945, 65 yrs. old  GENDER:  female ENDOSCOPIST:  Iva Boop, MD, [facility]  PROCEDURE DATE:  04/03/2012 PROCEDURE:  Colonoscopy with biopsy ASA CLASS:  Class III INDICATIONS:  unexplained diarrhea MEDICATIONS:   These medications were titrated to patient response per physician's verbal order, MAC sedation, administered by CRNA, propofol (Diprivan) 270 mg IV  DESCRIPTION OF PROCEDURE:   After the risks benefits and alternatives of the procedure were thoroughly explained, informed consent was obtained.  Digital rectal exam was performed and revealed no abnormalities.   The LB [id number] endoscope was introduced through the anus and advanced to the terminal ileum which was intubated for a short distance, without limitations. The quality of the prep was adequate, using MoviPrep.  The instrument was then slowly withdrawn as the colon was fully examined.<<PROCEDUREIMAGES>>  FINDINGS:  A diminutive polyp was found in the cecum. 2 mm polyp at appendiceal area. The polyp was removed using cold biopsy forceps.  This was otherwise a normal examination of the colon. Brief look at very distal terminal ileum.  Random colon biopsies were obtained and sent to pathology.   Retroflexed views in the rectum revealed internal hemorrhoids.   The patient vomited 200+ cc fluid during procedure - was quickly placed into [place] and vomitus suctioned. No immediate ill effects, do not think she aspirated at this time.  The time to cecum = 8:52 minutes. The scope was then withdrawn in 5:53 minutes from the cecum and the procedure completed. COMPLICATIONS:  None ENDOSCOPIC IMPRESSION: 1) Diminutive polyp in the cecum - removed 2) Internal hemorrhoids 3) Otherwise normal examination - adequate prep RECOMMENDATIONS: 1) Await biopsy  results 2) will contact her with results and plans REPEAT EXAM:  In for Colonoscopy, pending biopsy results.  Iva Boop, MD, [facility]  CC:  The Patient  n. eSIGNED:   Iva Boop at 04/03/2012 02:07 PM  [place], [name] Foley, [phone]

## 2012-04-04 ENCOUNTER — Telehealth: Payer: Self-pay | Admitting: *Deleted

## 2012-04-04 ENCOUNTER — Telehealth: Payer: Self-pay | Admitting: Internal Medicine

## 2012-04-04 MED ORDER — AMOXICILLIN-POT CLAVULANATE 875-125 MG PO TABS: 1.0000 | ORAL_TABLET | Freq: Two times a day (BID) | ORAL | Status: AC

## 2012-04-04 NOTE — Telephone Encounter (Signed)
Patient reports continued cough.  She is advised of Dr. Marvell Fuller recommendations.  She does not have a ride today she will come tomorrow for an appt.  She will see Willette Cluster RNP at 2:00

## 2012-04-04 NOTE — Telephone Encounter (Signed)
[name] Foley -needs to be evaluated in the office - she vomited with procedure and could be developing a pneumonia

## 2012-04-04 NOTE — Addendum Note (Signed)
Addended by: Annett Fabian on: 04/04/2012 04:06 PM   Modules accepted: Orders

## 2012-04-04 NOTE — Telephone Encounter (Signed)
calling primary care physician

## 2012-04-04 NOTE — Telephone Encounter (Signed)
Do you still want her to see Gunnar Fusi then?

## 2012-04-04 NOTE — Telephone Encounter (Signed)
Patient advised.  She is aware I will call her back in the morning to clarify if she still needs to come to the appt tomorrow

## 2012-04-04 NOTE — Telephone Encounter (Signed)
If she has a way to get medicine picked up lets call in Augmentin 875 mg bid x 10 days

## 2012-04-04 NOTE — Telephone Encounter (Signed)
  Follow up Call-  Call back number 04/03/2012  Post procedure Call Back phone  # [phone]  Permission to leave phone message Yes     Patient questions:  Do you have a fever, pain , or abdominal swelling? no Pain Score  0 *  Have you tolerated food without any problems? yes  Have you been able to return to your normal activities? no  Do you have any questions about your discharge instructions: Diet   no Medications  no Follow up visit  no  Do you have questions or concerns about your Care? no  Actions: * If pain score is 4 or above: No action needed, pain <4.  PATIENT STATING "I CAUGHT A COLD THERE YESTERDAY. I' VE BEEN UP SINCE 3 (AM) COUGHING." PATIENT STATING SHE IS COUGHING UP YELLOW SPUTUM. DENIES FEVER OR CHEST PAIN. STATING HER COLD IS IN HER CHEST AND HEAD. NOTE FORWARDED TO DR. Leone Payor.

## 2012-04-05 ENCOUNTER — Telehealth: Payer: Self-pay

## 2012-04-05 ENCOUNTER — Encounter: Payer: Self-pay | Admitting: Nurse Practitioner

## 2012-04-05 ENCOUNTER — Ambulatory Visit (INDEPENDENT_AMBULATORY_CARE_PROVIDER_SITE_OTHER): Payer: Medicaid Other | Admitting: Nurse Practitioner

## 2012-04-05 VITALS — BP 100/78 | HR 88 | Ht 62.0 in | Wt 172.1 lb

## 2012-04-05 DIAGNOSIS — J3489 Other specified disorders of nose and nasal sinuses: Secondary | ICD-10-CM

## 2012-04-05 DIAGNOSIS — R05 Cough: Secondary | ICD-10-CM

## 2012-04-05 DIAGNOSIS — R0981 Nasal congestion: Secondary | ICD-10-CM

## 2012-04-05 NOTE — Telephone Encounter (Signed)
She should still be assessed with a visit, I can see your we could squeeze her in

## 2012-04-05 NOTE — Patient Instructions (Addendum)
Stop the Augmentin. You can get a Multi-symptom cold medicine,( over the counter ) to take from the pharmacy, or [facility].   If you develop a fever, chills, worsening cough call us right back  [phone].

## 2012-04-05 NOTE — Telephone Encounter (Signed)
Clarified Rx for pharmacy regarding the Creon, per Dr. Leone Payor its the Creon with Lipase 24,000units that pt needs.  Dois Davenport at [facility] said they will take care of it for the pt.

## 2012-04-06 ENCOUNTER — Encounter: Payer: Self-pay | Admitting: Nurse Practitioner

## 2012-04-06 DIAGNOSIS — R05 Cough: Secondary | ICD-10-CM | POA: Insufficient documentation

## 2012-04-06 DIAGNOSIS — R0981 Nasal congestion: Secondary | ICD-10-CM | POA: Insufficient documentation

## 2012-04-06 NOTE — Progress Notes (Signed)
[name] Foley [id number] 06/01/1946   HISTORY OR PRESENT ILLNESS : Patient is a 66 year old female followed by Dr. Leone Payor for history of chronic pancreatitis/biliary strictures and chronic diarrhea. She had a colonoscopy with propofol  for evaluation of diarrhea on 04/03/12. During the procedure patient vomited 200+ fluid. She was quickly placed in Trendelenburg and vomitus suctioned. After arriving home patient developed nasal congestion, runny nose and a cough. No fevers. There was concern that patient may have aspirated during the endoscopy. We started her empirically on Augmentin two days ago.   Current Medications, Allergies, Past Medical History, Past Surgical History, Family History and Social History were reviewed in [employer] record.   PHYSICAL EXAMINATION : General:  Well developed  female in no acute distress Head: Normocephalic and atraumatic. No tenderness over sinuses Lungs: Clear throughout to auscultation Heart: Regular rate and rhythm Abdomen: Soft, nondistended, nontender. No masses or hepatomegaly noted. Normal bowel sounds Neurological: Oriented, grossly nonfocal Cervical Nodes:  No significant cervical adenopathy Psychological:  Alert and cooperative. Normal mood and affect  ASSESSMENT AND PLAN :  66 year old female with cough, nasal congestion, and runny nose following episode of nausea and vomiting during colonoscopy with propofol two days ago. Doubt aspiration. No fevers, lungs are clear. No chest discomfort . Cough is not predominant complaint. Patient mostly concerned about "stuffy head" and runny nose. We started Augmentin empirically and a couple of days ago. Feel patient can discontinue Augmentin but patient will call us immediately if she has increasing cough, chills/fever. In the meantime she may try an over the counter multi- symptom cold preparation

## 2012-04-06 NOTE — Progress Notes (Signed)
Agree with Ms. Guenther's assessment and plan. Iva Boop, MD, [facility] ;a

## 2012-04-09 ENCOUNTER — Other Ambulatory Visit: Payer: Self-pay

## 2012-04-09 ENCOUNTER — Encounter: Payer: Self-pay | Admitting: Internal Medicine

## 2012-04-09 DIAGNOSIS — Z860100 Personal history of colon polyps, unspecified: Secondary | ICD-10-CM | POA: Insufficient documentation

## 2012-04-09 DIAGNOSIS — K529 Noninfective gastroenteritis and colitis, unspecified: Secondary | ICD-10-CM | POA: Insufficient documentation

## 2012-04-09 DIAGNOSIS — R197 Diarrhea, unspecified: Secondary | ICD-10-CM

## 2012-04-09 NOTE — Progress Notes (Signed)
Quick Note:  Diminutive cecal adenoma - will repeat clonoscopy in about 5 years 03/2017 Random bxs normal  Office will call patient -   She needs: 1) fecal elastase and qualitative fecal fat study (has not completed the latter) 2) She should record # of stools each day 3) bring #2 info to me at follow-up visit in mid-late July please  [facility]  No letter but place 5 year colon recall ______

## 2012-04-11 ENCOUNTER — Telehealth: Payer: Self-pay | Admitting: Internal Medicine

## 2012-04-11 NOTE — Telephone Encounter (Signed)
All questions answered.  Dr. Leone Payor has requested she do stool studies.  She does not have a way to get them here.  She will bring them with her to her follow up on 7/18

## 2012-05-10 ENCOUNTER — Other Ambulatory Visit: Payer: Medicaid Other

## 2012-05-10 ENCOUNTER — Other Ambulatory Visit (INDEPENDENT_AMBULATORY_CARE_PROVIDER_SITE_OTHER): Payer: Medicaid Other

## 2012-05-10 ENCOUNTER — Encounter: Payer: Self-pay | Admitting: Internal Medicine

## 2012-05-10 ENCOUNTER — Ambulatory Visit (INDEPENDENT_AMBULATORY_CARE_PROVIDER_SITE_OTHER): Payer: Medicaid Other | Admitting: Internal Medicine

## 2012-05-10 VITALS — BP 112/80 | HR 88 | Ht 62.0 in | Wt 169.4 lb

## 2012-05-10 DIAGNOSIS — K649 Unspecified hemorrhoids: Secondary | ICD-10-CM

## 2012-05-10 DIAGNOSIS — K861 Other chronic pancreatitis: Secondary | ICD-10-CM

## 2012-05-10 DIAGNOSIS — K529 Noninfective gastroenteritis and colitis, unspecified: Secondary | ICD-10-CM

## 2012-05-10 DIAGNOSIS — K429 Umbilical hernia without obstruction or gangrene: Secondary | ICD-10-CM | POA: Insufficient documentation

## 2012-05-10 DIAGNOSIS — K831 Obstruction of bile duct: Secondary | ICD-10-CM

## 2012-05-10 DIAGNOSIS — R197 Diarrhea, unspecified: Secondary | ICD-10-CM

## 2012-05-10 LAB — HEPATIC FUNCTION PANEL
Albumin: 3.7 g/dL (ref 3.5–5.2)
Alkaline Phosphatase: 262 U/L — ABNORMAL HIGH (ref 39–117)

## 2012-05-10 MED ORDER — DIPHENOXYLATE-ATROPINE 2.5-0.025 MG PO TABS: ORAL_TABLET | ORAL | Status: DC

## 2012-05-10 NOTE — Patient Instructions (Addendum)
You have been given a separate informational sheet regarding your tobacco use, the importance of quitting and local resources to help you quit.  We are giving you samples of creon to take 3 capsules with each meal/three times a day. Let us know how this works for you.  We are faxing a Rx for Lomotil to your drugstore.   We will let you know the results from the stool test you brought in today.  Your physician has requested that you go to the basement for the following lab work before leaving today: LFT's  Thank you for choosing me and [facility].

## 2012-05-10 NOTE — Progress Notes (Signed)
  Subjective:    Patient ID: [name] Foley, female    DOB: 03/22/1946, 66 y.o.   MRN: [id number]  HPI The patient returns for followup of chronic pancreatitis. She has a diarrhea problem and I think is from that. Random biopsies of her colonoscopy he certainly did not show any microscopic colitis. She is on Creon 20 4000 units with meals. She thinks things are better but she still has stools 3 or 5 a day and are often runny though sometimes they might be hard. She will use Lomotil so that she can leave the house and that's when she developed a hard bowel movement.  She is concerned that she has a hernia. She's having some soreness above the umbilicus.  Her hemorrhoids or prolapsing at times. They are currently not prolapsed.  Medications, allergies, past medical history, past surgical history, family history and social history are reviewed and updated in the EMR.  Review of Systems As above.    Objective:   Physical Exam General:  NAD Eyes:   anicteric Lungs:  clear Heart:  S1S2 no rubs, murmurs or gallops Abdomen:  soft and nontender, BS+, there is a small ventral hernia, the size of and a corner so, above into the right of the umbilicus best felt when she is standing and creating abdominal pressure. It is easily reducible. Ext:   no edema        Assessment & Plan:   1. Chronic pancreatitis   I'm still struggling to tell if this is the cause of her diarrhea though it is in part at least, it seems. Increase Creon from 24,000 units to 72,000 units with meals. This is a higher but still acceptable dose and I want to see her response she has on that. Samples provided to help with this. Await fecal elastase salts, she just turned these in. Lomotil as refilled #90, to be used as needed, hopefully the increasing Creon will reduce or illuminate the need.    I plan to call or when the fecal elastase results are in. And get a reading on how the diarrhea is on a new dose of Creon.  2.  Common bile duct stricture   Thought to be from chronic pancreatitis, recheck LFTs today. Still considering stenting though she is asymptomatic.   3. Periumbilical hernia     4. Hemorrhoids   Observe for now. Ligation is an option but if I get her bowel habits under better control these may not be so bothersome.    CC: Dorrene German, MD

## 2012-05-23 LAB — PANCREATIC ELASTASE, FECAL: Pancreatic Elastase-1, Stool: <15 mcg/g — ABNORMAL LOW

## 2012-05-23 NOTE — Progress Notes (Signed)
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   ______

## 2012-05-24 ENCOUNTER — Other Ambulatory Visit: Payer: Self-pay | Admitting: Internal Medicine

## 2012-05-24 DIAGNOSIS — K861 Other chronic pancreatitis: Secondary | ICD-10-CM

## 2012-05-24 MED ORDER — PANCRELIPASE (LIP-PROT-AMYL) 24000-76000 UNITS PO CPEP: ORAL_CAPSULE | ORAL | Status: DC

## 2012-05-24 NOTE — Assessment & Plan Note (Signed)
Improved on 72,000 U Creon with meals

## 2012-05-24 NOTE — Progress Notes (Signed)
Quick Note:  OK  Let her know that I will change prescription for 3 24,000 capsules with meals and 1 with snacks.  I would like to see her in 2-3 months ______

## 2012-06-11 ENCOUNTER — Encounter (HOSPITAL_COMMUNITY): Payer: Self-pay | Admitting: Emergency Medicine

## 2012-06-11 ENCOUNTER — Emergency Department (HOSPITAL_COMMUNITY)
Admission: EM | Admit: 2012-06-11 | Discharge: 2012-06-11 | Disposition: A | Discharge status: Home / Self Care | Payer: Medicaid Other | Attending: Emergency Medicine | Admitting: Emergency Medicine

## 2012-06-11 DIAGNOSIS — K219 Gastro-esophageal reflux disease without esophagitis: Secondary | ICD-10-CM | POA: Insufficient documentation

## 2012-06-11 DIAGNOSIS — I10 Essential (primary) hypertension: Secondary | ICD-10-CM | POA: Insufficient documentation

## 2012-06-11 DIAGNOSIS — G4700 Insomnia, unspecified: Secondary | ICD-10-CM | POA: Insufficient documentation

## 2012-06-11 DIAGNOSIS — R21 Rash and other nonspecific skin eruption: Secondary | ICD-10-CM | POA: Insufficient documentation

## 2012-06-11 DIAGNOSIS — F17200 Nicotine dependence, unspecified, uncomplicated: Secondary | ICD-10-CM | POA: Insufficient documentation

## 2012-06-11 DIAGNOSIS — E119 Type 2 diabetes mellitus without complications: Secondary | ICD-10-CM | POA: Insufficient documentation

## 2012-06-11 DIAGNOSIS — I2510 Atherosclerotic heart disease of native coronary artery without angina pectoris: Secondary | ICD-10-CM | POA: Insufficient documentation

## 2012-06-11 LAB — GLUCOSE, CAPILLARY: Glucose-Capillary: 84 mg/dL (ref 70–99)

## 2012-06-11 MED ORDER — CLOTRIMAZOLE 1 % EX CREA: TOPICAL_CREAM | CUTANEOUS | Status: DC

## 2012-06-11 NOTE — ED Provider Notes (Signed)
History  This chart was scribed for Glynn Octave, MD by Shari Heritage. The patient was seen in room TR04C/TR04C. Patient's care was started at 1129.     CSN: [id number]  Arrival date & time 06/11/12  1129   First MD Initiated Contact with Patient 06/11/12 1325      Chief Complaint  Patient presents with  . Rash    The history is provided by the patient. No language interpreter was used.   [name] Foley is a 66 y.o. female who presents to the Emergency Department complaining of rash to the palmar surface of the left hand and plantar surface of the left foot bilaterally. There is associated itching and redness. Patient states that the symptoms have been worsening over the past few days. Patient says that she saw her PCP who told her the rash was likely caused by a fungus. He prescribed mometasone furoate and clotrimazole. Patient has also been taking oral antibiotics.  Patient has a medical history of diabetes, pancreatitis, CAD, GERD, and HTN.   Past Medical History  Diagnosis Date  . Diabetes mellitus   . 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 Coronary artery disease   . Coronary stent occlusion   . GERD (gastroesophageal reflux disease)   . Hypertension     no medication currently B/P has been ok  . Pneumonia     12/02/11 pneumonia  . Stented coronary artery   . Tobacco dependence   . Alcohol abuse     abstinent since 2009  . Insomnia   . Psoriasis   . Hemorrhoids   . Colon adenoma 04/09/2012    diminutive cecal    Past Surgical History  Procedure Date  . Cholecystectomy   . Abdominal hysterectomy   . Cornary stent placement   . Eus 01/05/2012    Procedure: UPPER ENDOSCOPIC ULTRASOUND (EUS) LINEAR;  Surgeon: Rachael Fee, MD;  Location: [facility];  Service: Endoscopy;  Laterality: N/A;  radial linear  . Colonoscopy     multiple    Family History  Problem Relation Age of Onset  . Malignant hyperthermia Neg Hx   . Colon cancer        History  Substance Use Topics  . Smoking status: Current Everyday Smoker -- 0.5 packs/day for 15 years  . Smokeless tobacco: Never Used   Comment: TRYING TO CUT BACK.  Marland Kitchen Alcohol Use: No    OB History    Grav Para Term Preterm Abortions TAB SAB Ect Mult Living                  Review of Systems A complete 10 system review of systems was obtained and all systems are negative except as noted in the HPI and PMH.   Allergies  Diclofenac sodium  Home Medications   Current Outpatient Rx  Name Route Sig Dispense Refill  . CLOPIDOGREL BISULFATE 75 MG PO TABS Oral Take 75 mg by mouth daily.      Marland Kitchen CLOTRIMAZOLE-BETAMETHASONE 1-0.05 % EX CREA Topical Apply 1 application topically 2 (two) times daily as needed. For rash    . MEGESTROL ACETATE 40 MG/ML PO SUSP Oral Take 400 mg by mouth daily.    Marland Kitchen METFORMIN HCL 500 MG PO TABS Oral Take 500 mg by mouth daily.      . MOMETASONE FUROATE 0.1 % EX CREA Topical Apply 1 application topically 2 (two) times daily.    Marland Kitchen PANCRELIPASE (LIP-PROT-AMYL) 24000 UNITS  PO CPEP  3 capsules with meals and 1 with snacks 330 capsule 11  . QUETIAPINE FUMARATE 25 MG PO TABS Oral Take 25 mg by mouth at bedtime.    Marland Kitchen RANITIDINE HCL 150 MG PO TABS Oral Take 1 tablet (150 mg total) by mouth 2 (two) times daily. 60 tablet 3  . SULFAMETHOXAZOLE-TMP DS 800-160 MG PO TABS Oral Take 1 tablet by mouth 2 (two) times daily. Started on 8-14;  10 day course of therapy    . CLOTRIMAZOLE 1 % EX CREA  Apply to affected area 2 times daily 15 g 0    BP 108/81  Pulse 108  Temp 98.1 F (36.7 C) (Oral)  Resp 16  SpO2 98%  Physical Exam  Constitutional: She is oriented to person, place, and time. She appears well-developed and well-nourished.  HENT:  Head: Normocephalic and atraumatic.  Musculoskeletal: Normal range of motion.  Neurological: She is alert and oriented to person, place, and time.  Skin:       Hyperkeratotic skin to bilateral palms and soles and the left  shin. No open wounds. No drainage. No bleeding.  Psychiatric: Her behavior is normal.    ED Course  Procedures (including critical care time) DIAGNOSTIC STUDIES: Oxygen Saturation is 98% on room air, normal by my interpretation.    COORDINATION OF CARE: 1:29pm- Patient informed of current plan for treatment and evaluation and agrees with plan at this time.     Labs Reviewed  GLUCOSE, CAPILLARY    No results found.   1. Rash       MDM  2 weeks of hyperkeratotic rash to bilateral palms and soles it is itchy. Saw PCP and placed on initially clotrimazole/betamethasone and then mometasone without relief.  No fever, drainage, bleeding.  No new exposures. No systemic symptoms.  Will switch to steroid free clotrimazole. Follow up with PCP and dermatology.  I personally performed the services described in this documentation, which was scribed in my presence.  The recorded information has been reviewed and considered.    Glynn Octave, MD 06/11/12 1606

## 2012-06-11 NOTE — ED Notes (Signed)
Pt c/o itchy rash to hands and feet with redness

## 2012-06-27 ENCOUNTER — Emergency Department (HOSPITAL_COMMUNITY)
Admission: EM | Admit: 2012-06-27 | Discharge: 2012-06-27 | Disposition: A | Discharge status: Home / Self Care | Payer: Medicaid Other | Attending: Emergency Medicine | Admitting: Emergency Medicine

## 2012-06-27 ENCOUNTER — Encounter (HOSPITAL_COMMUNITY): Payer: Self-pay | Admitting: Physical Medicine and Rehabilitation

## 2012-06-27 DIAGNOSIS — F17200 Nicotine dependence, unspecified, uncomplicated: Secondary | ICD-10-CM | POA: Insufficient documentation

## 2012-06-27 DIAGNOSIS — I2510 Atherosclerotic heart disease of native coronary artery without angina pectoris: Secondary | ICD-10-CM | POA: Insufficient documentation

## 2012-06-27 DIAGNOSIS — Z9089 Acquired absence of other organs: Secondary | ICD-10-CM | POA: Insufficient documentation

## 2012-06-27 DIAGNOSIS — E119 Type 2 diabetes mellitus without complications: Secondary | ICD-10-CM | POA: Insufficient documentation

## 2012-06-27 DIAGNOSIS — N898 Other specified noninflammatory disorders of vagina: Secondary | ICD-10-CM | POA: Insufficient documentation

## 2012-06-27 DIAGNOSIS — K861 Other chronic pancreatitis: Secondary | ICD-10-CM | POA: Insufficient documentation

## 2012-06-27 DIAGNOSIS — R109 Unspecified abdominal pain: Secondary | ICD-10-CM

## 2012-06-27 DIAGNOSIS — K219 Gastro-esophageal reflux disease without esophagitis: Secondary | ICD-10-CM | POA: Insufficient documentation

## 2012-06-27 DIAGNOSIS — Z79899 Other long term (current) drug therapy: Secondary | ICD-10-CM | POA: Insufficient documentation

## 2012-06-27 DIAGNOSIS — I10 Essential (primary) hypertension: Secondary | ICD-10-CM | POA: Insufficient documentation

## 2012-06-27 LAB — COMPREHENSIVE METABOLIC PANEL
ALT: 62 U/L — ABNORMAL HIGH (ref 0–35)
AST: 63 U/L — ABNORMAL HIGH (ref 0–37)
Albumin: 3.5 g/dL (ref 3.5–5.2)
CO2: 20 mEq/L (ref 19–32)
Calcium: 9.2 mg/dL (ref 8.4–10.5)
Chloride: 107 mEq/L (ref 96–112)
Creatinine, Ser: 0.78 mg/dL (ref 0.50–1.10)
GFR calc non Af Amer: 87 mL/min — ABNORMAL LOW (ref >90)
Sodium: 138 mEq/L (ref 135–145)

## 2012-06-27 LAB — CBC WITH DIFFERENTIAL/PLATELET
Basophils Relative: 0 % (ref 0–1)
Eosinophils Relative: 4 % (ref 0–5)
Hemoglobin: 12.8 g/dL (ref 12.0–15.0)
Lymphocytes Relative: 36 % (ref 12–46)
Neutrophils Relative %: 53 % (ref 43–77)
Platelets: 375 10*3/uL (ref 150–400)
RBC: 5.63 MIL/uL — ABNORMAL HIGH (ref 3.87–5.11)
WBC: 7.1 10*3/uL (ref 4.0–10.5)

## 2012-06-27 LAB — URINALYSIS, ROUTINE W REFLEX MICROSCOPIC
Nitrite: NEGATIVE
Specific Gravity, Urine: 1.031 — ABNORMAL HIGH (ref 1.005–1.030)
Urobilinogen, UA: 1 mg/dL (ref 0.0–1.0)
pH: 6 (ref 5.0–8.0)

## 2012-06-27 LAB — URINE MICROSCOPIC-ADD ON

## 2012-06-27 MED ORDER — CEPHALEXIN 500 MG PO CAPS: 500.0000 mg | ORAL_CAPSULE | Freq: Four times a day (QID) | ORAL | Status: AC

## 2012-06-27 MED ORDER — PANTOPRAZOLE SODIUM 40 MG IV SOLR
40.0000 mg | Freq: Once | INTRAVENOUS | Status: AC
  Administered 2012-06-27: 40 mg via INTRAVENOUS
  Filled 2012-06-27 (×2): qty 40

## 2012-06-27 MED ORDER — ONDANSETRON HCL 4 MG/2ML IJ SOLN
4.0000 mg | Freq: Once | INTRAMUSCULAR | Status: AC
  Administered 2012-06-27: 4 mg via INTRAVENOUS
  Filled 2012-06-27: qty 2

## 2012-06-27 MED ORDER — HYDROMORPHONE HCL PF 1 MG/ML IJ SOLN
1.0000 mg | Freq: Once | INTRAMUSCULAR | Status: AC
  Administered 2012-06-27: 1 mg via INTRAVENOUS
  Filled 2012-06-27: qty 1

## 2012-06-27 MED ORDER — HYDROMORPHONE HCL PF 1 MG/ML IJ SOLN
0.5000 mg | Freq: Once | INTRAMUSCULAR | Status: AC
  Administered 2012-06-27: 0.5 mg via INTRAVENOUS
  Filled 2012-06-27: qty 1

## 2012-06-27 MED ORDER — SODIUM CHLORIDE 0.9 % IV BOLUS (SEPSIS)
500.0000 mL | Freq: Once | INTRAVENOUS | Status: AC
  Administered 2012-06-27: 500 mL via INTRAVENOUS

## 2012-06-27 MED ORDER — HYDROCODONE-ACETAMINOPHEN 5-500 MG PO TABS: 1.0000 | ORAL_TABLET | Freq: Four times a day (QID) | ORAL | Status: AC | PRN

## 2012-06-27 NOTE — ED Provider Notes (Signed)
History     CSN: [id number]  Arrival date & time 06/27/12  0930   First MD Initiated Contact with Patient 06/27/12 0957      Chief Complaint  Patient presents with  . Abdominal Pain  . Vaginal Bleeding    (Consider location/radiation/quality/duration/timing/severity/associated sxs/prior treatment) Patient is a 66 y.o. female presenting with abdominal pain and vaginal bleeding. The history is provided by the patient.  Abdominal Pain The primary symptoms of the illness include abdominal pain and vaginal bleeding. The primary symptoms of the illness do not include fever, shortness of breath or dysuria.  Symptoms associated with the illness do not include chills or back pain.  Vaginal Bleeding Associated symptoms include abdominal pain. Pertinent negatives include no chest pain, no headaches and no shortness of breath.  pt c/o epigastric pain for the past few days. Constant, dull, non radiating. Worse w palpation, no other consistent exacerbating or alleviating factors. States this is the same pain she has had in the past related to her chronic pancreatitis. Hx cholecystectomy. Denies hx pud. No nv. Having normal bms. No diarrhea or constipation. No rectal bleeding or melena. Also notes in past couple days intermittently when wipes after urinating notes small amt brb. Denies heavy vaginal bleeding. No dysuria or urgency. Denies rectal bleeding. No other abn bleeding or bruising. Denies hx same. No faintness or dizziness. No fever or chills. Denies mid to lower abd pain. No flank pain.     Past Medical History  Diagnosis Date  . Diabetes mellitus   . 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 Coronary artery disease   . Coronary stent occlusion   . GERD (gastroesophageal reflux disease)   . Hypertension     no medication currently B/P has been ok  . Pneumonia     12/02/11 pneumonia  . Stented coronary artery   . Tobacco dependence   . Alcohol abuse     abstinent  since 2009  . Insomnia   . Psoriasis   . Hemorrhoids   . Colon adenoma 04/09/2012    diminutive cecal    Past Surgical History  Procedure Date  . Cholecystectomy   . Abdominal hysterectomy   . Cornary stent placement   . Eus 01/05/2012    Procedure: UPPER ENDOSCOPIC ULTRASOUND (EUS) LINEAR;  Surgeon: Rachael Fee, MD;  Location: [facility];  Service: Endoscopy;  Laterality: N/A;  radial linear  . Colonoscopy     multiple    Family History  Problem Relation Age of Onset  . Malignant hyperthermia Neg Hx   . Colon cancer      History  Substance Use Topics  . Smoking status: Current Everyday Smoker -- 0.5 packs/day for 15 years  . Smokeless tobacco: Never Used   Comment: TRYING TO CUT BACK.  Marland Kitchen Alcohol Use: No    OB History    Grav Para Term Preterm Abortions TAB SAB Ect Mult Living                  Review of Systems  Constitutional: Negative for fever and chills.  HENT: Negative for neck pain.   Eyes: Negative for redness.  Respiratory: Negative for cough and shortness of breath.   Cardiovascular: Negative for chest pain.  Gastrointestinal: Positive for abdominal pain.  Genitourinary: Positive for vaginal bleeding. Negative for dysuria and flank pain.  Musculoskeletal: Negative for back pain.  Skin: Negative for rash.  Neurological: Negative for headaches.  Hematological: Does not  bruise/bleed easily.  Psychiatric/Behavioral: Negative for confusion.    Allergies  Diclofenac sodium and Other  Home Medications   Current Outpatient Rx  Name Route Sig Dispense Refill  . CLOPIDOGREL BISULFATE 75 MG PO TABS Oral Take 75 mg by mouth daily.      Marland Kitchen CLOTRIMAZOLE 1 % EX CREA Topical Apply 1 application topically 2 (two) times daily. Apply to affected area 2 times daily    . MEGESTROL ACETATE 40 MG/ML PO SUSP Oral Take 400 mg by mouth daily.    Marland Kitchen METFORMIN HCL 500 MG PO TABS Oral Take 500 mg by mouth daily.      Marland Kitchen MIRTAZAPINE 15 MG PO TABS Oral Take 15 mg by mouth  at bedtime.    Marland Kitchen PANCRELIPASE (LIP-PROT-AMYL) 24000 UNITS PO CPEP Oral Take 3 capsules by mouth 3 (three) times daily. 3 capsules with meals and 1 with snacks    . QUETIAPINE FUMARATE 25 MG PO TABS Oral Take 25 mg by mouth at bedtime.    Marland Kitchen RANITIDINE HCL 150 MG PO TABS Oral Take 1 tablet (150 mg total) by mouth 2 (two) times daily. 60 tablet 3    BP 128/82  Pulse 102  Temp 98.7 F (37.1 C) (Oral)  Resp 18  SpO2 98%  Physical Exam  Nursing note and vitals reviewed. Constitutional: She appears well-developed and well-nourished. No distress.  HENT:  Mouth/Throat: Oropharynx is clear and moist.  Eyes: Conjunctivae are normal. No scleral icterus.  Neck: Neck supple. No tracheal deviation present.  Cardiovascular: Normal rate, regular rhythm, normal heart sounds and intact distal pulses.   Pulmonary/Chest: Effort normal and breath sounds normal. No respiratory distress.  Abdominal: Soft. Normal appearance. She exhibits no distension and no mass. There is tenderness. There is no rebound and no guarding.       Epigastric tenderness, no rebound or guarding.   Genitourinary: Vagina normal.       No cva tenderness. Ext exam normal. No vaginal bleeding or discharge. No masses felt.   Rectal yellowish, light brown stool, heme neg. No rectal masses. No bleeding hemorrhoids. No fissure seen.   Musculoskeletal: She exhibits no edema.  Neurological: She is alert.  Skin: Skin is warm and dry. No rash noted.  Psychiatric: She has a normal mood and affect.    ED Course  Procedures (including critical care time)   Labs Reviewed  URINALYSIS, ROUTINE W REFLEX MICROSCOPIC  CBC WITH DIFFERENTIAL  COMPREHENSIVE METABOLIC PANEL  LIPASE, BLOOD   Results for orders placed during the hospital encounter of 06/27/12  URINALYSIS, ROUTINE W REFLEX MICROSCOPIC      Component Value Range   Color, Urine AMBER (*) YELLOW   APPearance CLEAR  CLEAR   Specific Gravity, Urine 1.031 (*) 1.005 - 1.030   pH 6.0   5.0 - 8.0   Glucose, UA NEGATIVE  NEGATIVE mg/dL   Hgb urine dipstick NEGATIVE  NEGATIVE   Bilirubin Urine SMALL (*) NEGATIVE   Ketones, ur 15 (*) NEGATIVE mg/dL   Protein, ur 30 (*) NEGATIVE mg/dL   Urobilinogen, UA 1.0  0.0 - 1.0 mg/dL   Nitrite NEGATIVE  NEGATIVE   Leukocytes, UA SMALL (*) NEGATIVE  CBC WITH DIFFERENTIAL      Component Value Range   WBC 7.1  4.0 - 10.5 K/uL   RBC 5.63 (*) 3.87 - 5.11 MIL/uL   Hemoglobin 12.8  12.0 - 15.0 g/dL   HCT 16.1  09.6 - 04.5 %   MCV 67.0 (*) 78.0 -  100.0 fL   MCH 22.7 (*) 26.0 - 34.0 pg   MCHC 34.0  30.0 - 36.0 g/dL   RDW 40.9 (*) 81.1 - 91.4 %   Platelets 375  150 - 400 K/uL   Neutrophils Relative 53  43 - 77 %   Lymphocytes Relative 36  12 - 46 %   Monocytes Relative 7  3 - 12 %   Eosinophils Relative 4  0 - 5 %   Basophils Relative 0  0 - 1 %   Neutro Abs 3.7  1.7 - 7.7 K/uL   Lymphs Abs 2.6  0.7 - 4.0 K/uL   Monocytes Absolute 0.5  0.1 - 1.0 K/uL   Eosinophils Absolute 0.3  0.0 - 0.7 K/uL   Basophils Absolute 0.0  0.0 - 0.1 K/uL   RBC Morphology TARGET CELLS     Smear Review LARGE PLATELETS PRESENT    COMPREHENSIVE METABOLIC PANEL      Component Value Range   Sodium 138  135 - 145 mEq/L   Potassium 3.5  3.5 - 5.1 mEq/L   Chloride 107  96 - 112 mEq/L   CO2 20  19 - 32 mEq/L   Glucose, Bld 93  70 - 99 mg/dL   BUN 10  6 - 23 mg/dL   Creatinine, Ser 7.82  0.50 - 1.10 mg/dL   Calcium 9.2  8.4 - 95.6 mg/dL   Total Protein 7.3  6.0 - 8.3 g/dL   Albumin 3.5  3.5 - 5.2 g/dL   AST 63 (*) 0 - 37 U/L   ALT 62 (*) 0 - 35 U/L   Alkaline Phosphatase 328 (*) 39 - 117 U/L   Total Bilirubin 0.3  0.3 - 1.2 mg/dL   GFR calc non Af Amer 87 (*) >90 mL/min   GFR calc Af Amer >90  >90 mL/min  LIPASE, BLOOD      Component Value Range   Lipase 5 (*) 11 - 59 U/L  URINE MICROSCOPIC-ADD ON      Component Value Range   Squamous Epithelial / LPF FEW (*) RARE   WBC, UA 7-10  <3 WBC/hpf   RBC / HPF 0-2  <3 RBC/hpf   Bacteria, UA FEW (*) RARE     Crystals CA OXALATE CRYSTALS (*) NEGATIVE   Urine-Other MUCOUS PRESENT        MDM  Iv ns. Dilaudid iv. zofran iv. Labs.  Reviewed nursing notes and prior charts for additional history.    Possible uti w 7-10 wbc and few bact - will cx urine and rx. ?etiology gu bleeding as has noted small amt blood w wiping. On remainder exam, no vaginal or rectal bleeding noted.   Pt notes abd pain is same as w prior chronic pancreatitis. Recheck abd soft nt.         Suzi Roots, MD 06/27/12 [id number]

## 2012-06-27 NOTE — ED Notes (Signed)
Pt presents to department for evaluation of diffuse abdominal pain and vaginal bleeding. Pain ongoing since Saturday, vaginal bleeding started Monday. 10/10 pain at the time. Last BM this morning was runny. Denies urinary symptoms. She is alert and oriented x4.

## 2012-06-27 NOTE — ED Notes (Signed)
Patient claims having some bleeding.

## 2012-06-28 LAB — URINE CULTURE

## 2012-07-02 ENCOUNTER — Telehealth: Payer: Self-pay | Admitting: Internal Medicine

## 2012-07-02 NOTE — Telephone Encounter (Signed)
Pt states she is having abdominal pain. Pt states that it started on Wed and she was seen in the ER and given IV fluid and pain medicine. Pt states that it got better some Thursday but started again Friday and has hurt all weekend. Pt states the pain is constant and at the top of her stomach. Reports the pain is constant and rates it at a 9 on a scale of 1-10. Pt states her stomach is a little swollen, she states she thinks the pain is from her pancreas. Offered pt an appt with Dr. Leone Payor this afternoon but she states she does not have any transportation. States she is thinking about going to the ER when her neighbor gets off work after [employer] and can take her. States she has 2 pain pills left from her ER visit on Wednesday. Dr. Leone Payor please advise of any further instructions.

## 2012-07-03 ENCOUNTER — Emergency Department (HOSPITAL_COMMUNITY)
Admission: EM | Admit: 2012-07-03 | Discharge: 2012-07-04 | Disposition: A | Discharge status: Home / Self Care | Payer: Medicaid Other | Attending: Emergency Medicine | Admitting: Emergency Medicine

## 2012-07-03 ENCOUNTER — Encounter (HOSPITAL_COMMUNITY): Payer: Self-pay | Admitting: Family Medicine

## 2012-07-03 DIAGNOSIS — Z9861 Coronary angioplasty status: Secondary | ICD-10-CM | POA: Insufficient documentation

## 2012-07-03 DIAGNOSIS — Z90710 Acquired absence of both cervix and uterus: Secondary | ICD-10-CM | POA: Insufficient documentation

## 2012-07-03 DIAGNOSIS — R109 Unspecified abdominal pain: Secondary | ICD-10-CM

## 2012-07-03 DIAGNOSIS — I2510 Atherosclerotic heart disease of native coronary artery without angina pectoris: Secondary | ICD-10-CM | POA: Insufficient documentation

## 2012-07-03 DIAGNOSIS — F1021 Alcohol dependence, in remission: Secondary | ICD-10-CM | POA: Insufficient documentation

## 2012-07-03 DIAGNOSIS — E119 Type 2 diabetes mellitus without complications: Secondary | ICD-10-CM | POA: Insufficient documentation

## 2012-07-03 DIAGNOSIS — K861 Other chronic pancreatitis: Secondary | ICD-10-CM | POA: Insufficient documentation

## 2012-07-03 DIAGNOSIS — K219 Gastro-esophageal reflux disease without esophagitis: Secondary | ICD-10-CM | POA: Insufficient documentation

## 2012-07-03 DIAGNOSIS — F17200 Nicotine dependence, unspecified, uncomplicated: Secondary | ICD-10-CM | POA: Insufficient documentation

## 2012-07-03 LAB — URINALYSIS, ROUTINE W REFLEX MICROSCOPIC
Hgb urine dipstick: NEGATIVE
Ketones, ur: 15 mg/dL — AB
Specific Gravity, Urine: 1.031 — ABNORMAL HIGH (ref 1.005–1.030)
Urobilinogen, UA: 1 mg/dL (ref 0.0–1.0)

## 2012-07-03 LAB — URINE MICROSCOPIC-ADD ON

## 2012-07-03 LAB — COMPREHENSIVE METABOLIC PANEL
ALT: 54 U/L — ABNORMAL HIGH (ref 0–35)
AST: 46 U/L — ABNORMAL HIGH (ref 0–37)
Alkaline Phosphatase: 337 U/L — ABNORMAL HIGH (ref 39–117)
CO2: 24 mEq/L (ref 19–32)
Calcium: 9.6 mg/dL (ref 8.4–10.5)
Chloride: 99 mEq/L (ref 96–112)
GFR calc non Af Amer: >90 mL/min (ref >90)
Glucose, Bld: 140 mg/dL — ABNORMAL HIGH (ref 70–99)
Potassium: 3.1 mEq/L — ABNORMAL LOW (ref 3.5–5.1)
Sodium: 134 mEq/L — ABNORMAL LOW (ref 135–145)

## 2012-07-03 LAB — CBC WITH DIFFERENTIAL/PLATELET
Basophils Relative: 0 % (ref 0–1)
Eosinophils Absolute: 0.2 10*3/uL (ref 0.0–0.7)
Eosinophils Relative: 2 % (ref 0–5)
HCT: 39 % (ref 36.0–46.0)
Hemoglobin: 13.4 g/dL (ref 12.0–15.0)
Lymphocytes Relative: 34 % (ref 12–46)
Monocytes Relative: 8 % (ref 3–12)
Neutro Abs: 4.8 10*3/uL (ref 1.7–7.7)
Neutrophils Relative %: 56 % (ref 43–77)
RBC: 5.88 MIL/uL — ABNORMAL HIGH (ref 3.87–5.11)
WBC: 8.6 10*3/uL (ref 4.0–10.5)

## 2012-07-03 LAB — LIPASE, BLOOD: Lipase: 6 U/L — ABNORMAL LOW (ref 11–59)

## 2012-07-03 MED ORDER — HYDROMORPHONE HCL PF 1 MG/ML IJ SOLN
1.0000 mg | Freq: Once | INTRAMUSCULAR | Status: AC
  Administered 2012-07-03: 1 mg via INTRAVENOUS
  Filled 2012-07-03: qty 1

## 2012-07-03 MED ORDER — ONDANSETRON HCL 4 MG/2ML IJ SOLN
4.0000 mg | Freq: Once | INTRAMUSCULAR | Status: AC
  Administered 2012-07-03: 4 mg via INTRAVENOUS
  Filled 2012-07-03: qty 2

## 2012-07-03 MED ORDER — SODIUM CHLORIDE 0.9 % IV SOLN
Freq: Once | INTRAVENOUS | Status: AC
  Administered 2012-07-03: 22:00:00 via INTRAVENOUS

## 2012-07-03 NOTE — ED Notes (Signed)
The pt is c/o abd pain she says she has pancreatitis from drinking until 7 years ago. nv and diarrhea

## 2012-07-03 NOTE — Telephone Encounter (Signed)
No answer and no machine I will continue to try and reach the patient. 

## 2012-07-03 NOTE — ED Provider Notes (Signed)
History     CSN: [id number]  Arrival date & time 07/03/12  1729   First MD Initiated Contact with Patient 07/03/12 2023      Chief Complaint  Patient presents with  . Abdominal Pain    (Consider location/radiation/quality/duration/timing/severity/associated sxs/prior treatment) Patient is a 66 y.o. female presenting with abdominal pain. The history is provided by the patient.  Abdominal Pain The primary symptoms of the illness include abdominal pain.  She complains of upper abdominal pain with radiation through to her back. Pain is severe and she rates it at 10/10. Pain started 3 days ago. She states she had been in the emergency Department 5 days ago with similar pain and had improved, but pain recurred. Pain is similar to what she has had with pancreatitis in the past. She denies any alcohol consumption. Nothing makes the pain better nothing makes it worse. She had been given a prescription for pain medicine which was not helping.  Past Medical History  Diagnosis Date  . Diabetes mellitus   . 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 Coronary artery disease   . Coronary stent occlusion   . GERD (gastroesophageal reflux disease)   . Hypertension     no medication currently B/P has been ok  . Pneumonia     12/02/11 pneumonia  . Stented coronary artery   . Tobacco dependence   . Alcohol abuse     abstinent since 2009  . Insomnia   . Psoriasis   . Hemorrhoids   . Colon adenoma 04/09/2012    diminutive cecal    Past Surgical History  Procedure Date  . Cholecystectomy   . Abdominal hysterectomy   . Cornary stent placement   . Eus 01/05/2012    Procedure: UPPER ENDOSCOPIC ULTRASOUND (EUS) LINEAR;  Surgeon: Rachael Fee, MD;  Location: [facility];  Service: Endoscopy;  Laterality: N/A;  radial linear  . Colonoscopy     multiple    Family History  Problem Relation Age of Onset  . Malignant hyperthermia Neg Hx   . Colon cancer      History    Substance Use Topics  . Smoking status: Current Everyday Smoker -- 0.5 packs/day for 15 years  . Smokeless tobacco: Never Used   Comment: TRYING TO CUT BACK.  Marland Kitchen Alcohol Use: No    OB History    Grav Para Term Preterm Abortions TAB SAB Ect Mult Living                  Review of Systems  Gastrointestinal: Positive for abdominal pain.  All other systems reviewed and are negative.    Allergies  Diclofenac sodium and Other  Home Medications   Current Outpatient Rx  Name Route Sig Dispense Refill  . CEPHALEXIN 500 MG PO CAPS Oral Take 1 capsule (500 mg total) by mouth 4 (four) times daily. 20 capsule 0  . CLOPIDOGREL BISULFATE 75 MG PO TABS Oral Take 75 mg by mouth daily.      Marland Kitchen CLOTRIMAZOLE 1 % EX CREA Topical Apply 1 application topically 2 (two) times daily. Apply to affected area 2 times daily    . HYDROCODONE-ACETAMINOPHEN 5-500 MG PO TABS Oral Take 1-2 tablets by mouth every 6 (six) hours as needed for pain. 15 tablet 0  . MEGESTROL ACETATE 40 MG/ML PO SUSP Oral Take 400 mg by mouth daily.    Marland Kitchen METFORMIN HCL 500 MG PO TABS Oral Take 500 mg by  mouth daily.      Marland Kitchen MIRTAZAPINE 15 MG PO TABS Oral Take 15 mg by mouth at bedtime.    Marland Kitchen PANCRELIPASE (LIP-PROT-AMYL) 24000 UNITS PO CPEP Oral Take 3 capsules by mouth 3 (three) times daily. 3 capsules with meals and 1 with snacks    . QUETIAPINE FUMARATE 25 MG PO TABS Oral Take 25 mg by mouth at bedtime.    Marland Kitchen RANITIDINE HCL 150 MG PO TABS Oral Take 1 tablet (150 mg total) by mouth 2 (two) times daily. 60 tablet 3    BP 130/90  Pulse 79  Temp 98.8 F (37.1 C) (Oral)  Resp 15  SpO2 98%  Physical Exam  Nursing note and vitals reviewed. 66year old female, resting comfortably and in no acute distress. Vital signs are significant for borderline hypertension with blood pressure 143/84. Oxygen saturation is 94%, which is normal. Head is normocephalic and atraumatic. PERRLA, EOMI. Oropharynx is clear. Neck is nontender and supple  without adenopathy or JVD. Back is nontender and there is no CVA tenderness. Lungs are clear without rales, wheezes, or rhonchi. Chest is nontender. Heart has regular rate and rhythm without murmur. Abdomen is soft, flat, with moderate tenderness across the upper abdomen into the left midabdomen. There is no rebound or guarding. There are no masses or hepatosplenomegaly and peristalsis is normoactive. Extremities have no cyanosis or edema, full range of motion is present. Skin is warm and dry without rash. Neurologic: Mental status is normal, cranial nerves are intact, there are no motor or sensory deficits.   ED Course  Procedures (including critical care time)  Results for orders placed during the hospital encounter of 07/03/12  LIPASE, BLOOD      Component Value Range   Lipase 6 (*) 11 - 59 U/L  CBC WITH DIFFERENTIAL      Component Value Range   WBC 8.6  4.0 - 10.5 K/uL   RBC 5.88 (*) 3.87 - 5.11 MIL/uL   Hemoglobin 13.4  12.0 - 15.0 g/dL   HCT 16.1  09.6 - 04.5 %   MCV 66.3 (*) 78.0 - 100.0 fL   MCH 22.8 (*) 26.0 - 34.0 pg   MCHC 34.4  30.0 - 36.0 g/dL   RDW 40.9 (*) 81.1 - 91.4 %   Platelets 378  150 - 400 K/uL   Neutrophils Relative 56  43 - 77 %   Lymphocytes Relative 34  12 - 46 %   Monocytes Relative 8  3 - 12 %   Eosinophils Relative 2  0 - 5 %   Basophils Relative 0  0 - 1 %   Neutro Abs 4.8  1.7 - 7.7 K/uL   Lymphs Abs 2.9  0.7 - 4.0 K/uL   Monocytes Absolute 0.7  0.1 - 1.0 K/uL   Eosinophils Absolute 0.2  0.0 - 0.7 K/uL   Basophils Absolute 0.0  0.0 - 0.1 K/uL   RBC Morphology TARGET CELLS     Smear Review LARGE PLATELETS PRESENT    COMPREHENSIVE METABOLIC PANEL      Component Value Range   Sodium 134 (*) 135 - 145 mEq/L   Potassium 3.1 (*) 3.5 - 5.1 mEq/L   Chloride 99  96 - 112 mEq/L   CO2 24  19 - 32 mEq/L   Glucose, Bld 140 (*) 70 - 99 mg/dL   BUN 9  6 - 23 mg/dL   Creatinine, Ser 7.82  0.50 - 1.10 mg/dL   Calcium 9.6  8.4 -  10.5 mg/dL   Total  Protein 7.4  6.0 - 8.3 g/dL   Albumin 3.5  3.5 - 5.2 g/dL   AST 46 (*) 0 - 37 U/L   ALT 54 (*) 0 - 35 U/L   Alkaline Phosphatase 337 (*) 39 - 117 U/L   Total Bilirubin 0.3  0.3 - 1.2 mg/dL   GFR calc non Af Amer >90  >90 mL/min   GFR calc Af Amer >90  >90 mL/min  URINALYSIS, ROUTINE W REFLEX MICROSCOPIC      Component Value Range   Color, Urine AMBER (*) YELLOW   APPearance CLOUDY (*) CLEAR   Specific Gravity, Urine 1.031 (*) 1.005 - 1.030   pH 6.5  5.0 - 8.0   Glucose, UA NEGATIVE  NEGATIVE mg/dL   Hgb urine dipstick NEGATIVE  NEGATIVE   Bilirubin Urine SMALL (*) NEGATIVE   Ketones, ur 15 (*) NEGATIVE mg/dL   Protein, ur 30 (*) NEGATIVE mg/dL   Urobilinogen, UA 1.0  0.0 - 1.0 mg/dL   Nitrite NEGATIVE  NEGATIVE   Leukocytes, UA LARGE (*) NEGATIVE  URINE MICROSCOPIC-ADD ON      Component Value Range   Squamous Epithelial / LPF FEW (*) RARE   WBC, UA 7-10  <3 WBC/hpf   RBC / HPF 0-2  <3 RBC/hpf   Bacteria, UA RARE  RARE   Urine-Other MUCOUS PRESENT       1. Abdominal pain       MDM  Abdominal pain which most likely represents an exacerbation of chronic pancreatitis. Her laboratory workup has come back and her liver enzymes are at about the same level they have been in the past and lipase is low. This probably represents a burned out pancreas. At this point, I do not see indications for a repeat CT scanning. She shows no evidence of an acute surgical abdomen and WBC is normal. She will be treated symptomatically with IV fluids and narcotic analgesics and antiemetics and reassessed.  She got good relief of pain with hydromorphone, although she did require several doses. Since she had failed to get adequate relief of pain as an outpatient with hydrocodone 5 mg, she is discharged with a prescription for Norco 7.5-325 and also given a prescription for metoclopramide for nausea. She is to followup with her PCP in 2 days.  Dione Booze, MD 07/04/12 [phone]

## 2012-07-03 NOTE — Telephone Encounter (Signed)
Ok  Please be sure PCP gets these conversations -

## 2012-07-03 NOTE — Telephone Encounter (Signed)
Please follow-up with her today  How is she?  Should be on clears  We can refill her pain meds possibly  If possible she should avoid the ED

## 2012-07-03 NOTE — ED Notes (Signed)
Dr. Preston Fleeting updated re: pt request for pain med. Verbal order received for Hydromorphone 1mg  IV X1 now per Dr. Preston Fleeting.

## 2012-07-03 NOTE — ED Notes (Signed)
Pt complaining of upper quadrant pain. sts feels like her pancreas. sts N,V,D and pain since last week.

## 2012-07-03 NOTE — Telephone Encounter (Signed)
Patient reports that her pain is not improved at all.  She did not "get around to going to the ER".  She reports black stools that started this am, but admits to taking PeptoBismol last night.  She is asked to come for an appt tomorrow with Willette Cluster RNP , she declines.  She has agreed to schedule an appt for Thursday at 10:30.  She is asked to start on a clear liquid diet.  She will call back for further complaints.  She has an appt tomorrow with her Primary Care.

## 2012-07-04 MED ORDER — METOCLOPRAMIDE HCL 10 MG PO TABS: 10.0000 mg | ORAL_TABLET | Freq: Four times a day (QID) | ORAL | Status: DC | PRN

## 2012-07-04 MED ORDER — HYDROCODONE-ACETAMINOPHEN 7.5-325 MG PO TABS: 1.0000 | ORAL_TABLET | Freq: Four times a day (QID) | ORAL | Status: DC | PRN

## 2012-07-05 ENCOUNTER — Ambulatory Visit: Payer: Medicaid Other | Admitting: Nurse Practitioner

## 2012-07-05 ENCOUNTER — Telehealth: Payer: Self-pay | Admitting: Internal Medicine

## 2012-07-05 NOTE — Telephone Encounter (Signed)
Patient called after scheduled appt that was for today @ 10:30

## 2012-07-11 ENCOUNTER — Telehealth: Payer: Self-pay | Admitting: Internal Medicine

## 2012-07-12 ENCOUNTER — Encounter: Payer: Self-pay | Admitting: Internal Medicine

## 2012-07-12 NOTE — Telephone Encounter (Signed)
Patient reports that she is having continued pain and weight loss.  She has a history of pancreatitis.  She is given an appt for tomorrow with Dr. Leone Payor at 3:45

## 2012-07-12 NOTE — Telephone Encounter (Signed)
Unable to return the call, her voicemail has not been set up.  I will continue to try and reach the patient

## 2012-07-12 NOTE — Telephone Encounter (Signed)
Error---see message from 07-11-12

## 2012-07-13 ENCOUNTER — Other Ambulatory Visit (INDEPENDENT_AMBULATORY_CARE_PROVIDER_SITE_OTHER): Payer: Medicaid Other

## 2012-07-13 ENCOUNTER — Encounter: Payer: Self-pay | Admitting: Internal Medicine

## 2012-07-13 ENCOUNTER — Ambulatory Visit (INDEPENDENT_AMBULATORY_CARE_PROVIDER_SITE_OTHER): Payer: Medicaid Other | Admitting: Internal Medicine

## 2012-07-13 VITALS — BP 134/68 | HR 74 | Ht 62.0 in | Wt 162.0 lb

## 2012-07-13 DIAGNOSIS — E876 Hypokalemia: Secondary | ICD-10-CM

## 2012-07-13 DIAGNOSIS — F329 Major depressive disorder, single episode, unspecified: Secondary | ICD-10-CM

## 2012-07-13 DIAGNOSIS — K861 Other chronic pancreatitis: Secondary | ICD-10-CM

## 2012-07-13 DIAGNOSIS — K831 Obstruction of bile duct: Secondary | ICD-10-CM

## 2012-07-13 LAB — POTASSIUM: Potassium: 4.3 mEq/L (ref 3.5–5.1)

## 2012-07-13 MED ORDER — OMEPRAZOLE 40 MG PO CPDR: 40.0000 mg | DELAYED_RELEASE_CAPSULE | Freq: Every day | ORAL | Status: DC

## 2012-07-13 MED ORDER — OXYCODONE HCL 5 MG PO TABS: 5.0000 mg | ORAL_TABLET | ORAL | Status: DC | PRN

## 2012-07-13 MED ORDER — CLONAZEPAM 1 MG PO TABS: 1.0000 mg | ORAL_TABLET | Freq: Every day | ORAL | Status: DC

## 2012-07-13 MED ORDER — MIRTAZAPINE 15 MG PO TABS: 15.0000 mg | ORAL_TABLET | Freq: Every day | ORAL | Status: DC

## 2012-07-13 NOTE — Progress Notes (Signed)
  Subjective:    Patient ID: [name] Foley, female    DOB: 1946-03-23, 66 y.o.   MRN: [id number]  HPI This middle-aged [place] American woman presents with complaints of recurrent epigastric pain. Over the past couple of weeks or longer she's had increasing epigastric pain. She's been in the ER twice for laboratory testing is been unrevealing except for chronic elevation of her LFTs which were mildly worse. She is having epigastric pain radiating to her back and some bloating. Her bowel habits are under control on her pancreatic enzymes with no diarrhea. She's been using hydrocodone but hasn't really helped much she is out of that. She is also run out of her Remeron, her clonazepam because she has not been able to get in to see her primary care physician. She is not sleeping well. She is trying to eat some ensure or drink that and stay on a liquid diet. This is similar to her previous pancreatitis episodes. I think she was trying to get admitted when she went to the hospital but was not successful. She is really managing okay as an outpatient except that she is currently not having good pain control. Medications, allergies, past medical history, past surgical history, family history and social history are reviewed and updated in the EMR.   Review of Systems No fevers. Some nausea no vomiting.    Objective:   Physical Exam General:  NAD Eyes:   anicteric Lungs:  clear Heart:  S1S2 no rubs, murmurs or gallops Abdomen:  soft and moderately tender in the epigastrium, with some guarding, BS+, I'll be distended Ext:   no edema    Data Reviewed:  Lab Results  Component Value Date   WBC 8.6 07/03/2012   HGB 13.4 07/03/2012   HCT 39.0 07/03/2012   MCV 66.3* 07/03/2012   PLT 378 07/03/2012     Chemistry      Component Value Date/Time   NA 134* 07/03/2012 1800   K 4.3 07/13/2012 1546   CL 99 07/03/2012 1800   CO2 24 07/03/2012 1800   BUN 9 07/03/2012 1800   CREATININE 0.62 07/03/2012 1800        Component Value Date/Time   CALCIUM 9.6 07/03/2012 1800   ALKPHOS 337* 07/03/2012 1800   AST 46* 07/03/2012 1800   ALT 54* 07/03/2012 1800   BILITOT 0.3 07/03/2012 1800     Lab Results  Component Value Date   LIPASE 6* 07/03/2012            Assessment & Plan:   1. Chronic pancreatitis   2. Biliary obstruction   3. Hypokalemia   4. Depression    1. It sounds like her chronic pancreatitis is flaring. Question if there is a component of biliary obstruction worsening. Plan for CT the abdomen and pelvis. 2. Oxycodone 5 mg one to 2 every 4 hours as needed #60 no refills for pain control, she is to stay on a liquid diet 3. Recheck potassium level 4. I have refilled her Remeron and her clonazepam for one month until she gets to her primary care physician 5. Further plans pending the results of the evaluation  CC: Dorrene German, MD

## 2012-07-13 NOTE — Patient Instructions (Addendum)
We have given you 2 prescriptions to take to your pharmacy.  Your physician has requested that you go to the basement for the following lab work before leaving today:  Potassium level  You have been scheduled for a CT scan of the abdomen and pelvis at [facility] ([address]---this is in the same building as Architectural technologist).   You are scheduled on 07-18-12 at 9:00am. You should arrive 15 minutes prior to your appointment time for registration. Please follow the written instructions below on the day of your exam:  WARNING: IF YOU ARE ALLERGIC TO IODINE/X-RAY DYE, PLEASE NOTIFY RADIOLOGY IMMEDIATELY AT [phone]! YOU WILL BE GIVEN A 13 HOUR PREMEDICATION PREP.  1) Do not eat or drink anything after 5:00am (4 hours prior to your test) 2) You have been given 2 bottles of oral contrast to drink. The solution may taste better if refrigerated, but do NOT add ice or any other liquid to this solution. Shake  well before drinking.    Drink 1 bottle of contrast @ 7:00am (2 hours prior to your exam)  Drink 1 bottle of contrast @ 8:00am (1 hour prior to your exam)  Hold your Metformin the morning of the procedure  You may take any medications as prescribed with a small amount of water except for the following: Metformin, Glucophage, Glucovance, Avandamet, Riomet, Fortamet, Actoplus Met, Janumet, Glumetza or Metaglip. The above medications must be held the day of the exam AND 48 hours after the exam.  The purpose of you drinking the oral contrast is to aid in the visualization of your intestinal tract. The contrast solution may cause some diarrhea. Before your exam is started, you will be given a small amount of fluid to drink. Depending on your individual set of symptoms, you may also receive an intravenous injection of x-ray contrast/dye. Plan on being at [facility] for 30 minutes or long, depending on the type of exam you are having performed.  If you have any questions  regarding your exam or if you need to reschedule, you may call the CT department at [phone] between the hours of 8:00 am and 5:00 pm, Monday-Friday.  ________________________________________________________________________

## 2012-07-18 ENCOUNTER — Ambulatory Visit (INDEPENDENT_AMBULATORY_CARE_PROVIDER_SITE_OTHER)
Admission: RE | Admit: 2012-07-18 | Discharge: 2012-07-18 | Disposition: A | Discharge status: Home / Self Care | Payer: Medicaid Other | Source: Ambulatory Visit | Attending: Internal Medicine | Admitting: Internal Medicine

## 2012-07-18 DIAGNOSIS — K831 Obstruction of bile duct: Secondary | ICD-10-CM

## 2012-07-18 DIAGNOSIS — E876 Hypokalemia: Secondary | ICD-10-CM

## 2012-07-18 DIAGNOSIS — K861 Other chronic pancreatitis: Secondary | ICD-10-CM

## 2012-07-18 DIAGNOSIS — F329 Major depressive disorder, single episode, unspecified: Secondary | ICD-10-CM

## 2012-07-18 MED ORDER — IOHEXOL 300 MG/ML  SOLN
100.0000 mL | Freq: Once | INTRAMUSCULAR | Status: AC | PRN
  Administered 2012-07-18: 100 mL via INTRAVENOUS

## 2012-07-19 ENCOUNTER — Encounter: Payer: Self-pay | Admitting: Gastroenterology

## 2012-07-20 ENCOUNTER — Other Ambulatory Visit: Payer: Self-pay

## 2012-07-20 DIAGNOSIS — K838 Other specified diseases of biliary tract: Secondary | ICD-10-CM

## 2012-07-20 NOTE — Progress Notes (Signed)
Quick Note:  I called patient - still having epigastric pain  Reasonable to attempt trial of bile duct stenting to see if biliary decompression relieves pain  She takes Plavix but we recently had ok to hold so I think Ok to hold again  Please see if we can arrange a MAC ERCP Thursday or Friday next week - would allow 5 days of holding Plavix  She knows we are working on this possibility ______

## 2012-07-20 NOTE — Progress Notes (Signed)
Quick Note:  Discussed Ok to do Wed w/ MAC ______

## 2012-07-24 DIAGNOSIS — I2109 ST elevation (STEMI) myocardial infarction involving other coronary artery of anterior wall: Secondary | ICD-10-CM

## 2012-07-24 HISTORY — DX: ST elevation (STEMI) myocardial infarction involving other coronary artery of anterior wall: I21.09

## 2012-07-25 ENCOUNTER — Ambulatory Visit (HOSPITAL_COMMUNITY): Payer: Medicaid Other

## 2012-07-25 ENCOUNTER — Encounter (HOSPITAL_COMMUNITY): Payer: Self-pay | Admitting: Anesthesiology

## 2012-07-25 ENCOUNTER — Encounter (HOSPITAL_COMMUNITY)
Admission: RE | Disposition: A | Discharge status: Home / Self Care | Payer: Self-pay | Source: Ambulatory Visit | Attending: Internal Medicine

## 2012-07-25 ENCOUNTER — Encounter (HOSPITAL_COMMUNITY): Payer: Self-pay

## 2012-07-25 ENCOUNTER — Ambulatory Visit (HOSPITAL_COMMUNITY): Payer: Medicaid Other | Admitting: Anesthesiology

## 2012-07-25 ENCOUNTER — Observation Stay (HOSPITAL_COMMUNITY)
Admission: RE | Admit: 2012-07-25 | Discharge: 2012-07-26 | Disposition: A | Discharge status: Home / Self Care | Payer: Medicaid Other | Source: Ambulatory Visit | Attending: Internal Medicine | Admitting: Internal Medicine

## 2012-07-25 DIAGNOSIS — E119 Type 2 diabetes mellitus without complications: Secondary | ICD-10-CM

## 2012-07-25 DIAGNOSIS — K861 Other chronic pancreatitis: Principal | ICD-10-CM

## 2012-07-25 DIAGNOSIS — K8689 Other specified diseases of pancreas: Secondary | ICD-10-CM | POA: Insufficient documentation

## 2012-07-25 DIAGNOSIS — K838 Other specified diseases of biliary tract: Secondary | ICD-10-CM | POA: Insufficient documentation

## 2012-07-25 DIAGNOSIS — K449 Diaphragmatic hernia without obstruction or gangrene: Secondary | ICD-10-CM | POA: Insufficient documentation

## 2012-07-25 DIAGNOSIS — K831 Obstruction of bile duct: Secondary | ICD-10-CM

## 2012-07-25 DIAGNOSIS — R1013 Epigastric pain: Secondary | ICD-10-CM | POA: Insufficient documentation

## 2012-07-25 HISTORY — PX: ERCP: SHX5425

## 2012-07-25 LAB — CREATININE, SERUM
Creatinine, Ser: 0.66 mg/dL (ref 0.50–1.10)
GFR calc Af Amer: >90 mL/min (ref >90)
GFR calc non Af Amer: >90 mL/min (ref >90)

## 2012-07-25 LAB — GLUCOSE, CAPILLARY
Glucose-Capillary: 103 mg/dL — ABNORMAL HIGH (ref 70–99)
Glucose-Capillary: 60 mg/dL — ABNORMAL LOW (ref 70–99)
Glucose-Capillary: 81 mg/dL (ref 70–99)

## 2012-07-25 LAB — CBC
Hemoglobin: 12.6 g/dL (ref 12.0–15.0)
MCH: 22.6 pg — ABNORMAL LOW (ref 26.0–34.0)
RBC: 5.57 MIL/uL — ABNORMAL HIGH (ref 3.87–5.11)

## 2012-07-25 SURGERY — ERCP, WITH INTERVENTION IF INDICATED
Anesthesia: Monitor Anesthesia Care

## 2012-07-25 MED ORDER — CIPROFLOXACIN IN D5W 400 MG/200ML IV SOLN
INTRAVENOUS | Status: AC
  Filled 2012-07-25: qty 200

## 2012-07-25 MED ORDER — MIDAZOLAM HCL 5 MG/5ML IJ SOLN
INTRAMUSCULAR | Status: DC | PRN
  Administered 2012-07-25 (×2): 1 mg via INTRAVENOUS

## 2012-07-25 MED ORDER — DEXTROSE 50 % IV SOLN
12.5000 g | INTRAVENOUS | Status: AC
  Administered 2012-07-25: 12.5 g via INTRAVENOUS
  Filled 2012-07-25: qty 50

## 2012-07-25 MED ORDER — FENTANYL CITRATE 0.05 MG/ML IJ SOLN: 25.0000 ug | INTRAMUSCULAR | Status: DC | PRN

## 2012-07-25 MED ORDER — ENOXAPARIN SODIUM 40 MG/0.4ML ~~LOC~~ SOLN
40.0000 mg | SUBCUTANEOUS | Status: DC
  Administered 2012-07-25: 40 mg via SUBCUTANEOUS
  Filled 2012-07-25 (×2): qty 0.4

## 2012-07-25 MED ORDER — FENTANYL CITRATE 0.05 MG/ML IJ SOLN
INTRAMUSCULAR | Status: DC | PRN
  Administered 2012-07-25 (×7): 25 ug via INTRAVENOUS

## 2012-07-25 MED ORDER — LACTATED RINGERS IV SOLN
INTRAVENOUS | Status: DC | PRN
  Administered 2012-07-25: 12:00:00 via INTRAVENOUS

## 2012-07-25 MED ORDER — CLONAZEPAM 1 MG PO TABS
1.0000 mg | ORAL_TABLET | Freq: Every day | ORAL | Status: DC
  Administered 2012-07-25: 1 mg via ORAL
  Filled 2012-07-25: qty 1

## 2012-07-25 MED ORDER — LACTATED RINGERS IV SOLN
INTRAVENOUS | Status: DC
  Administered 2012-07-25: 75 mL via INTRAVENOUS
  Administered 2012-07-26: 1000 mL via INTRAVENOUS

## 2012-07-25 MED ORDER — OXYCODONE HCL 5 MG PO TABS
5.0000 mg | ORAL_TABLET | ORAL | Status: DC | PRN
  Administered 2012-07-25: 10 mg via ORAL
  Administered 2012-07-26: 5 mg via ORAL
  Filled 2012-07-25: qty 2
  Filled 2012-07-25: qty 1

## 2012-07-25 MED ORDER — SODIUM CHLORIDE 0.9 % IV SOLN: INTRAVENOUS | Status: DC

## 2012-07-25 MED ORDER — CLOPIDOGREL BISULFATE 75 MG PO TABS
75.0000 mg | ORAL_TABLET | Freq: Every day | ORAL | Status: DC
  Administered 2012-07-25 – 2012-07-26 (×2): 75 mg via ORAL
  Filled 2012-07-25 (×3): qty 1

## 2012-07-25 MED ORDER — KETAMINE HCL 50 MG/ML IJ SOLN
INTRAMUSCULAR | Status: DC | PRN
  Administered 2012-07-25 (×2): 25 mg via INTRAMUSCULAR

## 2012-07-25 MED ORDER — CLOTRIMAZOLE 1 % EX CREA
1.0000 "application " | TOPICAL_CREAM | Freq: Two times a day (BID) | CUTANEOUS | Status: DC
  Administered 2012-07-26: 1 via TOPICAL
  Filled 2012-07-25: qty 15

## 2012-07-25 MED ORDER — METOCLOPRAMIDE HCL 10 MG PO TABS: 10.0000 mg | ORAL_TABLET | Freq: Four times a day (QID) | ORAL | Status: DC | PRN

## 2012-07-25 MED ORDER — INFLUENZA VIRUS VACC SPLIT PF IM SUSP
0.5000 mL | INTRAMUSCULAR | Status: AC
  Administered 2012-07-26: 0.5 mL via INTRAMUSCULAR
  Filled 2012-07-25: qty 0.5

## 2012-07-25 MED ORDER — LACTATED RINGERS IV SOLN
INTRAVENOUS | Status: DC
  Administered 2012-07-25: 1000 mL via INTRAVENOUS

## 2012-07-25 MED ORDER — QUETIAPINE FUMARATE 25 MG PO TABS
25.0000 mg | ORAL_TABLET | Freq: Every day | ORAL | Status: DC
  Administered 2012-07-25: 25 mg via ORAL
  Filled 2012-07-25 (×2): qty 1

## 2012-07-25 MED ORDER — GLUCAGON HCL (RDNA) 1 MG IJ SOLR
INTRAMUSCULAR | Status: AC
  Filled 2012-07-25: qty 1

## 2012-07-25 MED ORDER — PROMETHAZINE HCL 25 MG/ML IJ SOLN: 6.2500 mg | INTRAMUSCULAR | Status: DC | PRN

## 2012-07-25 MED ORDER — PANCRELIPASE (LIP-PROT-AMYL) 12000-38000 UNITS PO CPEP
6.0000 | ORAL_CAPSULE | Freq: Three times a day (TID) | ORAL | Status: DC
  Administered 2012-07-25 – 2012-07-26 (×2): 6 via ORAL
  Filled 2012-07-25 (×5): qty 6

## 2012-07-25 MED ORDER — METFORMIN HCL 500 MG PO TABS
500.0000 mg | ORAL_TABLET | Freq: Every day | ORAL | Status: DC
  Administered 2012-07-26: 500 mg via ORAL
  Filled 2012-07-25 (×2): qty 1

## 2012-07-25 MED ORDER — MIRTAZAPINE 15 MG PO TABS
15.0000 mg | ORAL_TABLET | Freq: Every day | ORAL | Status: DC
Start: 2012-07-25 — End: 2012-07-26
  Administered 2012-07-25: 15 mg via ORAL
  Filled 2012-07-25 (×2): qty 1

## 2012-07-25 MED ORDER — MEGESTROL ACETATE 40 MG/ML PO SUSP
400.0000 mg | Freq: Every day | ORAL | Status: DC
Start: 2012-07-26 — End: 2012-07-26
  Administered 2012-07-26: 400 mg via ORAL
  Filled 2012-07-25: qty 10

## 2012-07-25 MED ORDER — GLUCAGON HCL (RDNA) 1 MG IJ SOLR
INTRAMUSCULAR | Status: DC | PRN
  Administered 2012-07-25 (×2): .5 mg via INTRAVENOUS

## 2012-07-25 MED ORDER — MORPHINE SULFATE 2 MG/ML IJ SOLN
2.0000 mg | INTRAMUSCULAR | Status: DC | PRN
  Administered 2012-07-26: 2 mg via INTRAVENOUS
  Filled 2012-07-25: qty 1
  Filled 2012-07-25: qty 2

## 2012-07-25 MED ORDER — SODIUM CHLORIDE 0.9 % IV SOLN
3.0000 g | INTRAVENOUS | Status: DC | PRN
  Administered 2012-07-25: 1.5 g via INTRAVENOUS

## 2012-07-25 MED ORDER — PANTOPRAZOLE SODIUM 40 MG PO TBEC
40.0000 mg | DELAYED_RELEASE_TABLET | Freq: Every day | ORAL | Status: DC
  Administered 2012-07-26: 40 mg via ORAL
  Filled 2012-07-25: qty 1

## 2012-07-25 MED ORDER — PNEUMOCOCCAL VAC POLYVALENT 25 MCG/0.5ML IJ INJ
0.5000 mL | INJECTION | INTRAMUSCULAR | Status: AC
  Administered 2012-07-26: 0.5 mL via INTRAMUSCULAR
  Filled 2012-07-25: qty 0.5

## 2012-07-25 MED ORDER — INDOMETHACIN 50 MG RE SUPP
100.0000 mg | Freq: Once | RECTAL | Status: AC
  Administered 2012-07-25: 100 mg via RECTAL
  Filled 2012-07-25 (×2): qty 2

## 2012-07-25 MED ORDER — SODIUM CHLORIDE 0.9 % IV SOLN
INTRAVENOUS | Status: AC
  Filled 2012-07-25: qty 1.5

## 2012-07-25 MED ORDER — PHENYLEPHRINE HCL 10 MG/ML IJ SOLN
INTRAMUSCULAR | Status: DC | PRN
  Administered 2012-07-25: 50 ug via INTRAVENOUS

## 2012-07-25 MED ORDER — PROPOFOL 10 MG/ML IV EMUL
INTRAVENOUS | Status: DC | PRN
  Administered 2012-07-25: 75 ug/kg/min via INTRAVENOUS

## 2012-07-25 NOTE — Anesthesia Postprocedure Evaluation (Signed)
  Anesthesia Post-op Note  Patient: [name] Foley  Procedure(s) Performed: Procedure(s) (LRB): ENDOSCOPIC RETROGRADE CHOLANGIOPANCREATOGRAPHY (ERCP) (N/A)  Patient Location: PACU  Anesthesia Type: MAC  Level of Consciousness: awake and alert   Airway and Oxygen Therapy: Patient Spontanous Breathing  Post-op Pain: mild  Post-op Assessment: Post-op Vital signs reviewed, Patient's Cardiovascular Status Stable, Respiratory Function Stable, Patent Airway and No signs of Nausea or vomiting  Post-op Vital Signs: stable  Complications: No apparent anesthesia complications

## 2012-07-25 NOTE — Op Note (Signed)
[facility] [address] [place], [address]   ERCP PROCEDURE REPORT  PATIENT: [name] Foley, [name].  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 MEDICATIONS: See Anesthesia Report and Antibiotics TOPICAL ANESTHETIC: none  DESCRIPTION OF PROCEDURE:   After the risks benefits and alternatives of the procedure were thoroughly explained, informed consent was obtained.  The Pentax ERCP [id number]  endoscope was introduced through the mouth  and advanced to the second portion of the duodenum . Esophagus not seen well. Endoscopic survey notable for a hiatal hernia.  Pancreatic duct cannulated with wire and contrast injected. Then with wire in pancreatic duct the bile duct was cannulated with a wire and contrast injected.  1.  A mild stricture was noted in the distal common bile duct. Subtle finding. difficult visualization. Smooth. Cytology brushing obtained. 2.  There was dilation of the common bile duct and intrahepatic ducts. Max 13 mm CBD, s/p chole 3.  Under endoscopic and fluoroscopic guidance a 10 Fr x 5 cm double flap stent was placed in the bile duct. 4.  Generalized dilation was found in the entire pancreatic duct with irregularity The scope was then completely withdrawn from the patient and the procedure terminated.  Note that fluoroscopy machine malfunctined in case and had to be replaced so some xray images may have been lost.     COMPLICATIONS: .  There were no complications.  ENDOSCOPIC IMPRESSION: 1.   Mild stricture in the distal common bile duct - brushed and stendet 2.   There was dilation of the common bile duct and intrahepatic ducts.  dilated duct(s) 3.   Under endoscopic and fluoroscopic guidance a 10 Fr x 5 cm  Stent placement into bile duct to see if this helps her pain. Given subtle stricture I suspect it may not help and that pain is mostly from pancreatitis. 4.   Generalized dilation was found in the entire pancreatic duct with irregularity and changes of chronic pancreatitis. 5.    Hiatal Hernia  RECOMMENDATIONS: Observation admit - she is frail and lives alone so needs monitoring post procedure. Follow-up response of pain to stent placement      eSigned:  Iva Boop, MD, [facility] 07/25/2012 3:21 PM

## 2012-07-25 NOTE — Anesthesia Preprocedure Evaluation (Signed)
Anesthesia Evaluation  Patient identified by MRN, date of birth, ID band Patient awake    Reviewed: Allergy & Precautions, H&P , NPO status , Patient's Chart, lab work & pertinent test results  Airway Mallampati: II TM Distance: >3 FB Neck ROM: Full    Dental No notable dental hx.    Pulmonary Current Smoker,  breath sounds clear to auscultation  Pulmonary exam normal       Cardiovascular hypertension, + CAD and + Cardiac Stents Rhythm:Regular Rate:Normal     Neuro/Psych negative neurological ROS  negative psych ROS   GI/Hepatic negative GI ROS, (+)     substance abuse  alcohol use, Quit 2009   Endo/Other  diabetes, Type 2  Renal/GU negative Renal ROS  negative genitourinary   Musculoskeletal negative musculoskeletal ROS (+)   Abdominal   Peds negative pediatric ROS (+)  Hematology negative hematology ROS (+)   Anesthesia Other Findings   Reproductive/Obstetrics negative OB ROS                           Anesthesia Physical Anesthesia Plan  ASA: III  Anesthesia Plan: General and MAC   Post-op Pain Management:    Induction: Intravenous  Airway Management Planned: Oral ETT  Additional Equipment:   Intra-op Plan:   Post-operative Plan:   Informed Consent: I have reviewed the patients History and Physical, chart, labs and discussed the procedure including the risks, benefits and alternatives for the proposed anesthesia with the patient or authorized representative who has indicated his/her understanding and acceptance.   Dental advisory given  Plan Discussed with: CRNA and Surgeon  Anesthesia Plan Comments:         Anesthesia Quick Evaluation

## 2012-07-25 NOTE — Interval H&P Note (Signed)
Allergies  Allergen Reactions  . Diclofenac Sodium     REACTION: Chronic renal insufficiency.  . Other Other (See Comments)    Blackeyed peas and Pinto Beans...break out in hives   @ENCMEDSTART @ Past Medical History  Diagnosis Date  . Diabetes mellitus   . 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 Coronary artery disease   . Coronary stent occlusion   . GERD (gastroesophageal reflux disease)   . Hypertension     no medication currently B/P has been ok  . Pneumonia     12/02/11 pneumonia  . Stented coronary artery   . Tobacco dependence   . Alcohol abuse     abstinent since 2009  . Insomnia   . Psoriasis   . Hemorrhoids   . Colon adenoma 04/09/2012    diminutive cecal   Past Surgical History  Procedure Date  . Cholecystectomy   . Abdominal hysterectomy   . Cornary stent placement   . Eus 01/05/2012    Procedure: UPPER ENDOSCOPIC ULTRASOUND (EUS) LINEAR;  Surgeon: Rachael Fee, MD;  Location: [facility];  Service: Endoscopy;  Laterality: N/A;  radial linear  . Colonoscopy     multiple   History   Social History  . Marital Status: Single    Spouse Name: N/A    Number of Children: N/A  . Years of Education: N/A   Occupational History  . RETIRED    Social History Main Topics  . Smoking status: Current Every Day Smoker -- 0.5 packs/day for 15 years  . Smokeless tobacco: Never Used   Comment: TRYING TO CUT BACK.  Marland Kitchen Alcohol Use: No  . Drug Use: No  . Sexually Active: No   Other Topics Concern  . None   Social History Narrative  . None   Family History  Problem Relation Age of Onset  . Malignant hyperthermia Neg Hx   . Colon cancer

## 2012-07-25 NOTE — Progress Notes (Signed)
Patient received via stretcher from ENDO, alert, cooperative, mild discomfort with IV fluids infusing.  Assisted patient to bathroom, voided qs, ambulated without issue tolerating well returning to bed.  Given clear liquids, no nausea and/or vomiting noted.  Family in room with patient.  Room temperature adjusted for her comfort.

## 2012-07-25 NOTE — Interval H&P Note (Signed)
History and Physical Interval Note:  07/25/2012 1:28 PM  [name] Foley  has presented today for surgery, with the diagnosis of dilated cbd   The various methods of treatment have been discussed with the patient and family. After consideration of risks, benefits and other options for treatment, the patient has consented to  Procedure(s) (LRB) with comments: ENDOSCOPIC RETROGRADE CHOLANGIOPANCREATOGRAPHY (ERCP) (N/A) - with stent as a surgical intervention .  The patient's history has been reviewed, patient examined, no change in status, stable for surgery.  I have reviewed the patient's chart and labs.  Questions were answered to the patient's satisfaction.    Trial of bile duct stent for biliary obstruction  Stan Head, MD, [facility]

## 2012-07-25 NOTE — Transfer of Care (Signed)
Immediate Anesthesia Transfer of Care Note  Patient: [name] Foley  Procedure(s) Performed: Procedure(s) (LRB) with comments: ENDOSCOPIC RETROGRADE CHOLANGIOPANCREATOGRAPHY (ERCP) (N/A) - with stent  Patient Location: PACU  Anesthesia Type: MAC  Level of Consciousness: awake  Airway & Oxygen Therapy: Patient Spontanous Breathing and Patient connected to nasal cannula oxygen  Post-op Assessment: Report given to PACU RN and Post -op Vital signs reviewed and stable  Post vital signs: Reviewed and stable  Complications: No apparent anesthesia complications

## 2012-07-25 NOTE — H&P (View-Only) (Signed)
  Subjective:    Patient ID: [name] Foley, female    DOB: 10/28/1945, 65 y.o.   MRN: [id number]  HPI This middle-aged African American woman presents with complaints of recurrent epigastric pain. Over the past couple of weeks or longer she's had increasing epigastric pain. She's been in the ER twice for laboratory testing is been unrevealing except for chronic elevation of her LFTs which were mildly worse. She is having epigastric pain radiating to her back and some bloating. Her bowel habits are under control on her pancreatic enzymes with no diarrhea. She's been using hydrocodone but hasn't really helped much she is out of that. She is also run out of her Remeron, her clonazepam because she has not been able to get in to see her primary care physician. She is not sleeping well. She is trying to eat some ensure or drink that and stay on a liquid diet. This is similar to her previous pancreatitis episodes. I think she was trying to get admitted when she went to the hospital but was not successful. She is really managing okay as an outpatient except that she is currently not having good pain control. Medications, allergies, past medical history, past surgical history, family history and social history are reviewed and updated in the EMR.   Review of Systems No fevers. Some nausea no vomiting.    Objective:   Physical Exam General:  NAD Eyes:   anicteric Lungs:  clear Heart:  S1S2 no rubs, murmurs or gallops Abdomen:  soft and moderately tender in the epigastrium, with some guarding, BS+, I'll be distended Ext:   no edema    Data Reviewed:  Lab Results  Component Value Date   WBC 8.6 07/03/2012   HGB 13.4 07/03/2012   HCT 39.0 07/03/2012   MCV 66.3* 07/03/2012   PLT 378 07/03/2012     Chemistry      Component Value Date/Time   NA 134* 07/03/2012 1800   K 4.3 07/13/2012 1546   CL 99 07/03/2012 1800   CO2 24 07/03/2012 1800   BUN 9 07/03/2012 1800   CREATININE 0.62 07/03/2012 1800        Component Value Date/Time   CALCIUM 9.6 07/03/2012 1800   ALKPHOS 337* 07/03/2012 1800   AST 46* 07/03/2012 1800   ALT 54* 07/03/2012 1800   BILITOT 0.3 07/03/2012 1800     Lab Results  Component Value Date   LIPASE 6* 07/03/2012            Assessment & Plan:   1. Chronic pancreatitis   2. Biliary obstruction   3. Hypokalemia   4. Depression    1. It sounds like her chronic pancreatitis is flaring. Question if there is a component of biliary obstruction worsening. Plan for CT the abdomen and pelvis. 2. Oxycodone 5 mg one to 2 every 4 hours as needed #60 no refills for pain control, she is to stay on a liquid diet 3. Recheck potassium level 4. I have refilled her Remeron and her clonazepam for one month until she gets to her primary care physician 5. Further plans pending the results of the evaluation  CC: AVBUERE,EDWIN A, MD  

## 2012-07-26 ENCOUNTER — Encounter (HOSPITAL_COMMUNITY): Payer: Self-pay | Admitting: Internal Medicine

## 2012-07-26 MED ORDER — OXYCODONE HCL 5 MG PO TABS
10.0000 mg | ORAL_TABLET | Freq: Three times a day (TID) | ORAL | Status: DC | PRN
Start: 2012-07-26 — End: 2012-09-07

## 2012-07-26 NOTE — Progress Notes (Signed)
[facility] Progress Note  Subjective:  Patient says that pain is improved to 7/10 compared to 9/10 prior to procedure.  No nausea or vomiting.  Tolerating clear liquids and wants to eat.  Only got one dose of morphine around 1 AM.  Last oxycodone was yesterday around 5 PM.  Says that she is going to ask for another dose of morphine this AM so that she can sleep; was up all night urinating due to the IVF's.    Objective:  Vital signs in last 24 hours: Temp:  [97.6 F (36.4 C)-98.9 F (37.2 C)] 98.9 F (37.2 C) (10/03 0531) Pulse Rate:  [64-86] 64  (10/03 0531) Resp:  [6-28] 17  (10/03 0531) BP: (127-143)/(71-92) 130/82 mmHg (10/03 0531) SpO2:  [96 %-100 %] 98 % (10/03 0531) Weight:  [162 lb (73.483 kg)] 162 lb (73.483 kg) (10/02 1719) Last BM Date: 07/25/12 General:   Alert, Well-developed, in NAD Heart:  Regular rate and rhythm; no murmurs Pulm:  CTAB.  No W/R/R. Abdomen:  Soft, nondistended. Normal bowel sounds.  Epigastric and RUQ TTP. Extremities:  Without edema. Neurologic:  Alert and  oriented x4;  grossly normal neurologically. Psych:  Alert and cooperative. Normal mood and affect.  Intake/Output from previous day: 10/02 0701 - 10/03 0700 In: 1540 [P.O.:740; I.V.:800] Out: 4 [Urine:2; Stool:2]  Lab Results:  Basename 07/25/12 1720  WBC 8.6  HGB 12.6  HCT 38.0  PLT 368   BMET  Basename 07/25/12 1720  NA --  K --  CL --  CO2 --  GLUCOSE --  BUN --  CREATININE 0.66  CALCIUM --    Dg Ercp  07/25/2012  *RADIOLOGY REPORT*  Clinical Data: Common bile duct stricture  ERCP and stent placement  Comparison:  M r c p 12/03/2011, CT scan of the abdomen/pelvis 07/18/2012  Technique:  Multiple spot images obtained with the fluoroscopic device and submitted for interpretation post-procedure.  ERCP was performed by Dr.  Leone Payor.  Findings: Multiple fluoroscopic spot images obtained at the time of ERCP and biliary stenting demonstrate cannulation of the common  bile duct and placement of a plastic biliary stent.  There is moderate dilatation of the common bile duct.  Multiple irregular calcifications in the background consistent with the sequela of chronic pancreatitis.  IMPRESSION: Fluoroscopic spot images obtained at the time of ERCP and placement of a plastic biliary stent as detailed above.  These images were submitted for radiologic interpretation only. Please see the procedural report for the amount of contrast and the fluoroscopy time utilized.   Original Report Authenticated By: Vilma Prader     Assessment / Plan: -Chronic pancreatitis -Mild distal CBD stricture s/p ERCP with plastic stent placement 10/2.  Cytology/brushing pending. -Epigastric abdominal pain.  Likely related to the chronic pancreatitis rather than the subtle CBD stricture, however, stent was placed to see if it would alleviate her pain at all; patient says that pain is improved.  *Await results from cytology/brushings. *Continue chronic pancreatic enzymes. *Advance diet to low fat/carb restricted.  If she tolerates this well I suspect that she will be discharged home later today.   LOS: 1 day   ZEHR, JESSICA D.  07/26/2012, 8:42 AM  Pager number [phone]   GI Attending  I have also seen and assessed the patient and agree with the above note. She tolerated solid food - says she feels better. abd is nontender now Needs refill on pain medications - she was taking 2 most of time at home (5mg   oxycodone). Will rx #60 10 mg oxycodone every 6 hrs prn abdominal pain no refills See me in 1 month in office    Iva Boop, MD, [facility] [phone] (pager) 07/26/2012 10:28 AM

## 2012-07-26 NOTE — Discharge Summary (Signed)
[facility] Discharge Summary  Name: [name] Foley MRN: [id number] DOB: 1946/07/25 66 y.o. PCP:  Dorrene German, MD  Date of Admission: 07/25/2012 11:21 AM Date of Discharge: 07/26/2012 Attending Physician: Iva Boop, MD  Discharge Diagnosis: Chronic pancreatitis. Epigastric abdominal pain. CBD stricture with biliary ductal dilation.  Consultations: None  Procedures Performed:  Ct Abdomen Pelvis W Contrast  07/18/2012  *RADIOLOGY REPORT*  Clinical Data: Chronic pancreatitis, biliary obstruction.  CT ABDOMEN AND PELVIS WITH CONTRAST  Technique:  Multidetector CT imaging of the abdomen and pelvis was performed following the standard protocol during bolus administration of intravenous contrast.  Contrast: OMNIPAQUE IOHEXOL 300 MG/ML  SOLN  Comparison: 12/03/2011 MRI, 12/02/2011 CT  Findings: Mild dependent atelectasis.  Heart size upper normal to mildly enlarged.  Small hiatal hernia.  Hepatic steatosis.  Central intrahepatic and extrahepatic biliary ductal dilatation to the level of the ampulla. The CBD measures up to 12 mm, as compared to 16 mm previously by my measurements. Status post cholecystectomy.  Spleen size within normal limits. Unremarkable adrenal glands.  Pancreas parenchymal atrophy and coarse calcifications throughout. Associated main pancreatic ductal ectasia with slight interval decreased dilatation; measuring 7 mm as compared to 10 mm previously. No peripancreatic fat stranding or fluid collection. Right renal cyst again noted.  No hydronephrosis or hydroureter.  Decompressed colon limits evaluation.  No bowel obstruction or overt evidence for colitis.  Appendix within normal limits.  No free intraperitoneal air or fluid.  No lymphadenopathy.  Normal caliber aorta and branch vessels.  The portal vein, superior mesenteric vein, and splenic vein are patent.  Fat containing right anterior abdominal wall and periumbilical hernias.  No bowel extension or CT  evidence for incarceration.  Thin-walled bladder.  Absent uterus.  No adnexal mass.  No acute osseous finding.  IMPRESSION: Sequelae of chronic pancreatitis.  No CT evidence of active inflammation.  Dilated central intrahepatic and extrahepatic bile ducts, slightly decreased from the prior.   Original Report Authenticated By: Waneta Martins, M.D.    Dg Ercp  07/25/2012  *RADIOLOGY REPORT*  Clinical Data: Common bile duct stricture  ERCP and stent placement  Comparison:  M r c p 12/03/2011, CT scan of the abdomen/pelvis 07/18/2012  Technique:  Multiple spot images obtained with the fluoroscopic device and submitted for interpretation post-procedure.  ERCP was performed by Dr.  Leone Payor.  Findings: Multiple fluoroscopic spot images obtained at the time of ERCP and biliary stenting demonstrate cannulation of the common bile duct and placement of a plastic biliary stent.  There is moderate dilatation of the common bile duct.  Multiple irregular calcifications in the background consistent with the sequela of chronic pancreatitis.  IMPRESSION: Fluoroscopic spot images obtained at the time of ERCP and placement of a plastic biliary stent as detailed above.  These images were submitted for radiologic interpretation only. Please see the procedural report for the amount of contrast and the fluoroscopy time utilized.   Original Report Authenticated By: Vilma Prader    GI Procedures: 10/2 ERCP with brushing for cytology of a mild distal CBD stricture.  Plastic stent placement.  History/Physical Exam:  See Admission H&P  Admission HPI: Patient with chronic pancreatitis and uncontrolled epigastric abdominal pain.  Underwent elective ERCP 10/2 with stent placement to see if this would help improve her pain.  Hospital Course by problem list:  Patient was admitted for observation post ERCP with stent placement on 10/2.  Started on her home medications along with morphine and oxycodone for pain control.  She did well during  her stay with improvement of her abdominal pain; no nausea or vomiting.  Start on clear liquids and advanced to low fat/carb restricted diet on 10/3, which she tolerated as well.  It was determined that she was stable for D/C with follow-up with Dr. Leone Payor in one month.  Discharge Vitals:  BP 123/82  Pulse 72  Temp 99 F (37.2 C) (Oral)  Resp 22  Ht 5\' 2"  (1.575 m)  Wt 162 lb (73.483 kg)  BMI 29.63 kg/m2  SpO2 99%  Discharge Labs:  Results for orders placed during the hospital encounter of 07/25/12 (from the past 24 hour(s))  GLUCOSE, CAPILLARY     Status: Abnormal   Collection Time   07/25/12 12:31 PM      Component Value Range   Glucose-Capillary 60 (*) 70 - 99 mg/dL  GLUCOSE, CAPILLARY     Status: Abnormal   Collection Time   07/25/12  1:29 PM      Component Value Range   Glucose-Capillary 103 (*) 70 - 99 mg/dL  GLUCOSE, CAPILLARY     Status: Normal   Collection Time   07/25/12  3:50 PM      Component Value Range   Glucose-Capillary 91  70 - 99 mg/dL  CBC     Status: Abnormal   Collection Time   07/25/12  5:20 PM      Component Value Range   WBC 8.6  4.0 - 10.5 K/uL   RBC 5.57 (*) 3.87 - 5.11 MIL/uL   Hemoglobin 12.6  12.0 - 15.0 g/dL   HCT 16.1  09.6 - 04.5 %   MCV 68.2 (*) 78.0 - 100.0 fL   MCH 22.6 (*) 26.0 - 34.0 pg   MCHC 33.2  30.0 - 36.0 g/dL   RDW 40.9 (*) 81.1 - 91.4 %   Platelets 368  150 - 400 K/uL  CREATININE, SERUM     Status: Normal   Collection Time   07/25/12  5:20 PM      Component Value Range   Creatinine, Ser 0.66  0.50 - 1.10 mg/dL   GFR calc non Af Amer >90  >90 mL/min   GFR calc Af Amer >90  >90 mL/min  GLUCOSE, CAPILLARY     Status: Normal   Collection Time   07/25/12  5:36 PM      Component Value Range   Glucose-Capillary 81  70 - 99 mg/dL   Comment 1 Notify RN      Disposition and follow-up:   [name] Foley was discharged from [facility] in stable condition.    Follow-up Appointments: Discharge Orders    Future  Appointments: Provider: Department: Dept Phone: Center:   08/27/2012 1:30 PM Iva Boop, MD [facility] Office [phone] [facility]     Future Orders Please Complete By Expires   Resume previous diet      Call MD for:  temperature >100.5      Discharge instructions      Activity as tolerated - No restrictions      Driving Restrictions      Comments:   Do not drive while taking oxycodone.   Call MD for:      Scheduling Instructions:   Severe increasing abdominal pain, nausea, vomiting.      Discharge Medications:   Medication List     As of 07/26/2012 11:31 AM    TAKE these medications         clonazePAM 1  MG tablet   Commonly known as: KLONOPIN   Take 1 tablet (1 mg total) by mouth at bedtime.      clopidogrel 75 MG tablet   Commonly known as: PLAVIX   Take 75 mg by mouth daily.      clotrimazole 1 % cream   Commonly known as: LOTRIMIN   Apply 1 application topically 2 (two) times daily. Apply to affected area 2 times daily      megestrol 40 MG/ML suspension   Commonly known as: MEGACE   Take 400 mg by mouth daily.      metFORMIN 500 MG tablet   Commonly known as: GLUCOPHAGE   Take 500 mg by mouth daily.      mirtazapine 15 MG tablet   Commonly known as: REMERON   Take 1 tablet (15 mg total) by mouth at bedtime.      omeprazole 40 MG capsule   Commonly known as: PRILOSEC   Take 1 capsule (40 mg total) by mouth daily. 30 minutes before breakfast      oxyCODONE 5 MG immediate release tablet   Commonly known as: Oxy IR/ROXICODONE   Take 2 tablets (10 mg total) by mouth every 8 (eight) hours as needed for pain (may take 2 tablets).      Pancrelipase (Lip-Prot-Amyl) 24000 UNITS Cpep   Take 3 capsules by mouth 3 (three) times daily. 3 capsules with meals and 1 with snacks      QUEtiapine 25 MG tablet   Commonly known as: SEROQUEL   Take 25 mg by mouth at bedtime.      ASK your doctor about these medications         metoCLOPramide 10 MG tablet    Commonly known as: REGLAN   Take 1 tablet (10 mg total) by mouth every 6 (six) hours as needed (nausea).        SignedCristi Loron, [place] Dowd D. 07/26/2012, 11:31 AM

## 2012-07-26 NOTE — Progress Notes (Signed)
Patient discharged to home.  Reviewed discharge instructions and prescriptions with patient.  IV removed from right hand.  Patient tolerating solid food well.  Patient without questions about discharge instructions.  Patient escorted to lobby via wheelchair by Nursing tech.  Patient discharged.

## 2012-07-27 ENCOUNTER — Encounter: Payer: Self-pay | Admitting: Internal Medicine

## 2012-07-27 NOTE — Progress Notes (Signed)
Quick Note:  Negative cytology ______

## 2012-08-10 ENCOUNTER — Ambulatory Visit (HOSPITAL_COMMUNITY): Admit: 2012-08-10 | Payer: Self-pay | Admitting: Cardiology

## 2012-08-10 ENCOUNTER — Encounter (HOSPITAL_COMMUNITY)
Admission: EM | Disposition: A | Discharge status: Home / Self Care | Payer: Self-pay | Source: Home / Self Care | Attending: Cardiovascular Disease

## 2012-08-10 ENCOUNTER — Inpatient Hospital Stay (HOSPITAL_COMMUNITY)
Admission: EM | Admit: 2012-08-10 | Discharge: 2012-08-13 | DRG: 247 | Disposition: A | Discharge status: Home / Self Care | Payer: Medicaid Other | Attending: Cardiovascular Disease | Admitting: Cardiovascular Disease

## 2012-08-10 ENCOUNTER — Encounter (HOSPITAL_COMMUNITY): Payer: Self-pay | Admitting: *Deleted

## 2012-08-10 DIAGNOSIS — Z9861 Coronary angioplasty status: Secondary | ICD-10-CM

## 2012-08-10 DIAGNOSIS — K861 Other chronic pancreatitis: Secondary | ICD-10-CM | POA: Diagnosis present

## 2012-08-10 DIAGNOSIS — I2510 Atherosclerotic heart disease of native coronary artery without angina pectoris: Secondary | ICD-10-CM

## 2012-08-10 DIAGNOSIS — E876 Hypokalemia: Secondary | ICD-10-CM | POA: Diagnosis present

## 2012-08-10 DIAGNOSIS — Z794 Long term (current) use of insulin: Secondary | ICD-10-CM

## 2012-08-10 DIAGNOSIS — I2109 ST elevation (STEMI) myocardial infarction involving other coronary artery of anterior wall: Secondary | ICD-10-CM

## 2012-08-10 DIAGNOSIS — I213 ST elevation (STEMI) myocardial infarction of unspecified site: Secondary | ICD-10-CM

## 2012-08-10 DIAGNOSIS — F1021 Alcohol dependence, in remission: Secondary | ICD-10-CM

## 2012-08-10 DIAGNOSIS — K219 Gastro-esophageal reflux disease without esophagitis: Secondary | ICD-10-CM | POA: Diagnosis present

## 2012-08-10 DIAGNOSIS — I10 Essential (primary) hypertension: Secondary | ICD-10-CM

## 2012-08-10 DIAGNOSIS — D649 Anemia, unspecified: Secondary | ICD-10-CM | POA: Diagnosis present

## 2012-08-10 DIAGNOSIS — E119 Type 2 diabetes mellitus without complications: Secondary | ICD-10-CM

## 2012-08-10 DIAGNOSIS — Z79899 Other long term (current) drug therapy: Secondary | ICD-10-CM

## 2012-08-10 DIAGNOSIS — E785 Hyperlipidemia, unspecified: Secondary | ICD-10-CM | POA: Diagnosis present

## 2012-08-10 DIAGNOSIS — Z7982 Long term (current) use of aspirin: Secondary | ICD-10-CM

## 2012-08-10 DIAGNOSIS — F17200 Nicotine dependence, unspecified, uncomplicated: Secondary | ICD-10-CM

## 2012-08-10 HISTORY — PX: LEFT HEART CATH: SHX5478

## 2012-08-10 HISTORY — PX: PERCUTANEOUS CORONARY STENT INTERVENTION (PCI-S): SHX5485

## 2012-08-10 LAB — LIPASE, BLOOD: Lipase: 5 U/L — ABNORMAL LOW (ref 11–59)

## 2012-08-10 LAB — CBC
MCH: 22.6 pg — ABNORMAL LOW (ref 26.0–34.0)
MCHC: 33.1 g/dL (ref 30.0–36.0)
MCV: 68.3 fL — ABNORMAL LOW (ref 78.0–100.0)
Platelets: 434 10*3/uL — ABNORMAL HIGH (ref 150–400)
RDW: 18.3 % — ABNORMAL HIGH (ref 11.5–15.5)
WBC: 12.3 10*3/uL — ABNORMAL HIGH (ref 4.0–10.5)

## 2012-08-10 LAB — POCT I-STAT, CHEM 8
BUN: 9 mg/dL (ref 6–23)
Creatinine, Ser: 0.7 mg/dL (ref 0.50–1.10)
Sodium: 143 mEq/L (ref 135–145)
TCO2: 17 mmol/L (ref 0–100)

## 2012-08-10 LAB — COMPREHENSIVE METABOLIC PANEL
AST: 21 U/L (ref 0–37)
Albumin: 2.8 g/dL — ABNORMAL LOW (ref 3.5–5.2)
Chloride: 109 mEq/L (ref 96–112)
Creatinine, Ser: 0.67 mg/dL (ref 0.50–1.10)
Potassium: 3 mEq/L — ABNORMAL LOW (ref 3.5–5.1)
Total Bilirubin: 0.2 mg/dL — ABNORMAL LOW (ref 0.3–1.2)

## 2012-08-10 LAB — DIFFERENTIAL
Basophils Absolute: 0.1 10*3/uL (ref 0.0–0.1)
Eosinophils Absolute: 0.5 10*3/uL (ref 0.0–0.7)
Lymphs Abs: 6.3 10*3/uL — ABNORMAL HIGH (ref 0.7–4.0)
Monocytes Absolute: 1.1 10*3/uL — ABNORMAL HIGH (ref 0.1–1.0)

## 2012-08-10 LAB — PROTIME-INR: INR: 1.3 (ref 0.00–1.49)

## 2012-08-10 LAB — APTT: aPTT: 31 seconds (ref 24–37)

## 2012-08-10 LAB — POCT I-STAT TROPONIN I

## 2012-08-10 SURGERY — LEFT HEART CATH
Anesthesia: LOCAL

## 2012-08-10 MED ORDER — HEPARIN SODIUM (PORCINE) 5000 UNIT/ML IJ SOLN
4000.0000 [IU] | INTRAMUSCULAR | Status: AC
  Administered 2012-08-10: 4000 [IU] via INTRAVENOUS

## 2012-08-10 MED ORDER — CLOPIDOGREL BISULFATE 300 MG PO TABS
ORAL_TABLET | ORAL | Status: AC
  Filled 2012-08-10: qty 1

## 2012-08-10 MED ORDER — FENTANYL CITRATE 0.05 MG/ML IJ SOLN
INTRAMUSCULAR | Status: AC
  Filled 2012-08-10: qty 2

## 2012-08-10 MED ORDER — BIVALIRUDIN 250 MG IV SOLR
INTRAVENOUS | Status: AC
  Filled 2012-08-10: qty 250

## 2012-08-10 MED ORDER — NITROGLYCERIN IN D5W 200-5 MCG/ML-% IV SOLN
INTRAVENOUS | Status: AC
  Filled 2012-08-10: qty 250

## 2012-08-10 MED ORDER — VERAPAMIL HCL 2.5 MG/ML IV SOLN
INTRAVENOUS | Status: AC
  Filled 2012-08-10: qty 2

## 2012-08-10 MED ORDER — HEPARIN (PORCINE) IN NACL 2-0.9 UNIT/ML-% IJ SOLN
INTRAMUSCULAR | Status: AC
  Filled 2012-08-10: qty 1500

## 2012-08-10 MED ORDER — ONDANSETRON HCL 4 MG/2ML IJ SOLN
4.0000 mg | Freq: Once | INTRAMUSCULAR | Status: AC
  Administered 2012-08-10: 4 mg via INTRAVENOUS
  Filled 2012-08-10: qty 2

## 2012-08-10 MED ORDER — MIDAZOLAM HCL 2 MG/2ML IJ SOLN
INTRAMUSCULAR | Status: AC
  Filled 2012-08-10: qty 2

## 2012-08-10 MED ORDER — NITROGLYCERIN 0.2 MG/ML ON CALL CATH LAB
INTRAVENOUS | Status: AC
  Filled 2012-08-10: qty 1

## 2012-08-10 MED ORDER — SODIUM CHLORIDE 0.9 % IV SOLN
INTRAVENOUS | Status: DC
  Administered 2012-08-10: 22:00:00 via INTRAVENOUS

## 2012-08-10 MED ORDER — LIDOCAINE HCL (PF) 1 % IJ SOLN
INTRAMUSCULAR | Status: AC
  Filled 2012-08-10: qty 30

## 2012-08-10 MED ORDER — ASPIRIN 81 MG PO CHEW
324.0000 mg | CHEWABLE_TABLET | Freq: Once | ORAL | Status: AC
  Administered 2012-08-10: 324 mg via ORAL
  Filled 2012-08-10: qty 4

## 2012-08-10 MED ORDER — FENTANYL CITRATE 0.05 MG/ML IJ SOLN
50.0000 ug | Freq: Once | INTRAMUSCULAR | Status: AC
  Administered 2012-08-10: 50 ug via INTRAVENOUS
  Filled 2012-08-10: qty 2

## 2012-08-10 NOTE — ED Notes (Signed)
Arrives as stemi into ED trauma room C, EDP Dr. Fonnie Jarvis present on arrival.

## 2012-08-10 NOTE — ED Notes (Signed)
To cath lab now on monitor with RN x2 and RRT RN., VSS, pain 10/10.

## 2012-08-10 NOTE — ED Notes (Signed)
Here by EMS for CP, nv, sob, arrives as STEMI, arrives alert, NAD, calm, interactive, skin  W&D, resps e/u, speaking in clear complete sentences. Rates pain 10/10

## 2012-08-10 NOTE — ED Notes (Signed)
No changes alert, NAD, calm, interactive, skin W&D, resps e/u, speakingin clear complete sentences, chewing sucking on ASA at this time.

## 2012-08-10 NOTE — H&P (Signed)
Chief Complaint:  [name] Foley is a 66 year old African female history of coronary artery disease status post Taxus stent implantation remotely. She has had stent occlusion 2006. She is a history of chronic pancreatitis, alcohol use, tobacco abuse. She developed chest pain and a cough this evening and was brought to [facility] ER where an EKG revealed anterolateral ST segment elevation. She was brought to the Cath Lab by Dr. Peter Swaziland for urgent intervention.  HPI:  This is a 66 y.o. female with a past medical history significant for CAD status post Taxus stent implantation remotely. She said stent occlusion in 2006. Other problems as outlined. She developed chest pain at 9 PM this evening and was brought to [facility] ER where she was noted to have anterolateral ST segment elevation. A STEMI was called and she was brought to the Cath Lab with Dr. Peter Swaziland.   PMHx:  Past Medical History  Diagnosis Date  . Diabetes mellitus   . 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 Coronary artery disease   . Coronary stent occlusion   . GERD (gastroesophageal reflux disease)   . Hypertension     no medication currently B/P has been ok  . Pneumonia     12/02/11 pneumonia  . Stented coronary artery   . Tobacco dependence   . Alcohol abuse     abstinent since 2009  . Insomnia   . Psoriasis   . Hemorrhoids   . Colon adenoma 04/09/2012    diminutive cecal    Past Surgical History  Procedure Date  . Cholecystectomy   . Abdominal hysterectomy   . Cornary stent placement   . Eus 01/05/2012    Procedure: UPPER ENDOSCOPIC ULTRASOUND (EUS) LINEAR;  Surgeon: Rachael Fee, MD;  Location: [facility];  Service: Endoscopy;  Laterality: N/A;  radial linear  . Colonoscopy     multiple  . Ercp 07/25/2012    Procedure: ENDOSCOPIC RETROGRADE CHOLANGIOPANCREATOGRAPHY (ERCP);  Surgeon: Iva Boop, MD;  Location: [facility];  Service: Endoscopy;  Laterality: N/A;  with stent    FAMHx:    Family History  Problem Relation Age of Onset  . Malignant hyperthermia Neg Hx   . Colon cancer      SOCHx:   reports that she has been smoking.  She has never used smokeless tobacco. She reports that she does not drink alcohol or use illicit drugs.  ALLERGIES:  Allergies  Allergen Reactions  . Diclofenac Sodium     REACTION: Chronic renal insufficiency.  . Other Other (See Comments)    Blackeyed peas and Pinto Beans...break out in hives    ROS: Pertinent items are noted in HPI.  HOME MEDS:  (Not in a hospital admission)  LABS/IMAGING: Results for orders placed during the hospital encounter of 08/10/12 (from the past 48 hour(s))  CBC     Status: Abnormal   Collection Time   08/10/12 10:07 PM      Component Value Range Comment   WBC 12.3 (*) 4.0 - 10.5 K/uL    RBC 4.64  3.87 - 5.11 MIL/uL    Hemoglobin 10.5 (*) 12.0 - 15.0 g/dL    HCT 69.6 (*) 29.5 - 46.0 %    MCV 68.3 (*) 78.0 - 100.0 fL    MCH 22.6 (*) 26.0 - 34.0 pg    MCHC 33.1  30.0 - 36.0 g/dL    RDW 28.4 (*) 13.2 - 15.5 %    Platelets 434 (*)  150 - 400 K/uL   DIFFERENTIAL     Status: Normal (Preliminary result)   Collection Time   08/10/12 10:07 PM      Component Value Range Comment   Neutrophils Relative PENDING  43 - 77 %    Neutro Abs PENDING  1.7 - 7.7 K/uL    Band Neutrophils PENDING  0 - 10 %    Lymphocytes Relative PENDING  12 - 46 %    Lymphs Abs PENDING  0.7 - 4.0 K/uL    Monocytes Relative PENDING  3 - 12 %    Monocytes Absolute PENDING  0.1 - 1.0 K/uL    Eosinophils Relative PENDING  0 - 5 %    Eosinophils Absolute PENDING  0.0 - 0.7 K/uL    Basophils Relative PENDING  0 - 1 %    Basophils Absolute PENDING  0.0 - 0.1 K/uL    WBC Morphology PENDING      RBC Morphology PENDING      Smear Review PENDING      nRBC PENDING  0 /100 WBC    Metamyelocytes Relative PENDING      Myelocytes PENDING      Promyelocytes Absolute PENDING      Blasts PENDING     PROTIME-INR     Status: Abnormal    Collection Time   08/10/12 10:07 PM      Component Value Range Comment   Prothrombin Time 15.9 (*) 11.6 - 15.2 seconds    INR 1.30  0.00 - 1.49   APTT     Status: Normal   Collection Time   08/10/12 10:07 PM      Component Value Range Comment   aPTT 31  24 - 37 seconds   COMPREHENSIVE METABOLIC PANEL     Status: Abnormal   Collection Time   08/10/12 10:07 PM      Component Value Range Comment   Sodium 140  135 - 145 mEq/L    Potassium 3.0 (*) 3.5 - 5.1 mEq/L    Chloride 109  96 - 112 mEq/L    CO2 18 (*) 19 - 32 mEq/L    Glucose, Bld 166 (*) 70 - 99 mg/dL    BUN 10  6 - 23 mg/dL    Creatinine, Ser 1.61  0.50 - 1.10 mg/dL    Calcium 8.5  8.4 - 09.6 mg/dL    Total Protein 6.3  6.0 - 8.3 g/dL    Albumin 2.8 (*) 3.5 - 5.2 g/dL    AST 21  0 - 37 U/L    ALT 14  0 - 35 U/L    Alkaline Phosphatase 213 (*) 39 - 117 U/L    Total Bilirubin 0.2 (*) 0.3 - 1.2 mg/dL    GFR calc non Af Amer >90  >90 mL/min    GFR calc Af Amer >90  >90 mL/min   LIPASE, BLOOD     Status: Abnormal   Collection Time   08/10/12 10:07 PM      Component Value Range Comment   Lipase 5 (*) 11 - 59 U/L   POCT I-STAT TROPONIN I     Status: Normal   Collection Time   08/10/12 10:20 PM      Component Value Range Comment   Troponin i, poc 0.00  0.00 - 0.08 ng/mL    Comment 3            POCT I-STAT, CHEM 8     Status: Abnormal  Collection Time   08/10/12 10:22 PM      Component Value Range Comment   Sodium 143  135 - 145 mEq/L    Potassium 3.0 (*) 3.5 - 5.1 mEq/L    Chloride 111  96 - 112 mEq/L    BUN 9  6 - 23 mg/dL    Creatinine, Ser 4.54  0.50 - 1.10 mg/dL    Glucose, Bld 098 (*) 70 - 99 mg/dL    Calcium, Ion 1.19 (*) 1.13 - 1.30 mmol/L    TCO2 17  0 - 100 mmol/L    Hemoglobin 11.2 (*) 12.0 - 15.0 g/dL    HCT 14.7 (*) 82.9 - 46.0 %    No results found.  VITALS: Blood pressure 102/69, temperature 97.7 F (36.5 C), temperature source Oral, resp. rate 32, weight 73.5 kg (162 lb 0.6 oz), SpO2  100.00%.  EXAM: General appearance: alert, cooperative and moderate distress Neck: no adenopathy, no carotid bruit, no JVD, supple, symmetrical, trachea midline and thyroid not enlarged, symmetric, no tenderness/mass/nodules Lungs: clear to auscultation bilaterally Heart: regular rate and rhythm, S1, S2 normal, no murmur, click, rub or gallop Extremities: extremities normal, atraumatic, no cyanosis or edema  IMPRESSION: ST segment elevation myocardial infarction. Dr. Peter Swaziland is performing acute intervention. She'll push with aspirin Plavix, her enzymes were cycled and she went to appropriate medications in the coronary care unit.  PLAN: 1: cycle cardiac enzymes.  2: Standard medications including beta blocker, statin, ACE inhibitor, aspirin and Plavix.  3: the remainder of her medical problem to be treated by the triad hospitalists.  Runell Gess 08/10/2012, 10:55 PM

## 2012-08-11 ENCOUNTER — Inpatient Hospital Stay (HOSPITAL_COMMUNITY): Payer: Medicaid Other

## 2012-08-11 ENCOUNTER — Encounter (HOSPITAL_COMMUNITY): Payer: Self-pay | Admitting: *Deleted

## 2012-08-11 DIAGNOSIS — E119 Type 2 diabetes mellitus without complications: Secondary | ICD-10-CM

## 2012-08-11 DIAGNOSIS — I2510 Atherosclerotic heart disease of native coronary artery without angina pectoris: Secondary | ICD-10-CM

## 2012-08-11 DIAGNOSIS — I213 ST elevation (STEMI) myocardial infarction of unspecified site: Secondary | ICD-10-CM | POA: Diagnosis present

## 2012-08-11 DIAGNOSIS — I219 Acute myocardial infarction, unspecified: Secondary | ICD-10-CM

## 2012-08-11 DIAGNOSIS — F17200 Nicotine dependence, unspecified, uncomplicated: Secondary | ICD-10-CM

## 2012-08-11 DIAGNOSIS — I10 Essential (primary) hypertension: Secondary | ICD-10-CM

## 2012-08-11 LAB — COMPREHENSIVE METABOLIC PANEL
ALT: 19 U/L (ref 0–35)
AST: 44 U/L — ABNORMAL HIGH (ref 0–37)
Albumin: 3.2 g/dL — ABNORMAL LOW (ref 3.5–5.2)
Calcium: 9 mg/dL (ref 8.4–10.5)
Creatinine, Ser: 0.64 mg/dL (ref 0.50–1.10)
GFR calc non Af Amer: >90 mL/min (ref >90)
Sodium: 139 mEq/L (ref 135–145)
Total Protein: 7.3 g/dL (ref 6.0–8.3)

## 2012-08-11 LAB — TROPONIN I
Troponin I: 3.27 ng/mL (ref <0.30)
Troponin I: >20 ng/mL (ref <0.30)
Troponin I: >20 ng/mL (ref <0.30)
Troponin I: 13.16 ng/mL (ref <0.30)

## 2012-08-11 LAB — HEMOGLOBIN A1C
Hgb A1c MFr Bld: 6 % — ABNORMAL HIGH (ref <5.7)
Mean Plasma Glucose: 126 mg/dL — ABNORMAL HIGH (ref <117)

## 2012-08-11 LAB — IRON AND TIBC
Iron: 34 ug/dL — ABNORMAL LOW (ref 42–135)
Saturation Ratios: 13 % — ABNORMAL LOW (ref 20–55)
UIBC: 238 ug/dL (ref 125–400)

## 2012-08-11 LAB — CBC WITH DIFFERENTIAL/PLATELET
Basophils Absolute: 0 10*3/uL (ref 0.0–0.1)
Eosinophils Absolute: 0.1 10*3/uL (ref 0.0–0.7)
Hemoglobin: 11.8 g/dL — ABNORMAL LOW (ref 12.0–15.0)
Lymphocytes Relative: 17 % (ref 12–46)
MCH: 22.8 pg — ABNORMAL LOW (ref 26.0–34.0)
MCHC: 33.4 g/dL (ref 30.0–36.0)
Monocytes Absolute: 0.7 10*3/uL (ref 0.1–1.0)
Neutrophils Relative %: 76 % (ref 43–77)
Platelets: 398 10*3/uL (ref 150–400)

## 2012-08-11 LAB — PRO B NATRIURETIC PEPTIDE: Pro B Natriuretic peptide (BNP): 163 pg/mL — ABNORMAL HIGH (ref 0–125)

## 2012-08-11 LAB — GLUCOSE, CAPILLARY
Glucose-Capillary: 135 mg/dL — ABNORMAL HIGH (ref 70–99)
Glucose-Capillary: 99 mg/dL (ref 70–99)

## 2012-08-11 LAB — LIPID PANEL
Cholesterol: 89 mg/dL (ref 0–200)
LDL Cholesterol: 50 mg/dL (ref 0–99)
Triglycerides: 48 mg/dL (ref <150)

## 2012-08-11 LAB — BASIC METABOLIC PANEL
BUN: 7 mg/dL (ref 6–23)
Creatinine, Ser: 0.62 mg/dL (ref 0.50–1.10)
GFR calc non Af Amer: >90 mL/min (ref >90)
Glucose, Bld: 110 mg/dL — ABNORMAL HIGH (ref 70–99)
Potassium: 3.8 mEq/L (ref 3.5–5.1)

## 2012-08-11 LAB — TSH: TSH: 1.471 u[IU]/mL (ref 0.350–4.500)

## 2012-08-11 LAB — CBC
HCT: 33.2 % — ABNORMAL LOW (ref 36.0–46.0)
Hemoglobin: 11 g/dL — ABNORMAL LOW (ref 12.0–15.0)
MCHC: 33.1 g/dL (ref 30.0–36.0)
MCV: 67.5 fL — ABNORMAL LOW (ref 78.0–100.0)

## 2012-08-11 LAB — RETICULOCYTES: RBC.: 4.92 MIL/uL (ref 3.87–5.11)

## 2012-08-11 MED ORDER — CLONAZEPAM 1 MG PO TABS
1.0000 mg | ORAL_TABLET | Freq: Every day | ORAL | Status: DC
  Administered 2012-08-11 – 2012-08-12 (×3): 1 mg via ORAL
  Filled 2012-08-11 (×3): qty 1

## 2012-08-11 MED ORDER — SODIUM CHLORIDE 0.9 % IV SOLN
1.0000 mL/kg/h | INTRAVENOUS | Status: AC
  Administered 2012-08-11: 1 mL/kg/h via INTRAVENOUS

## 2012-08-11 MED ORDER — ONDANSETRON HCL 4 MG/2ML IJ SOLN: 4.0000 mg | Freq: Four times a day (QID) | INTRAMUSCULAR | Status: DC | PRN

## 2012-08-11 MED ORDER — CLOPIDOGREL BISULFATE 75 MG PO TABS
75.0000 mg | ORAL_TABLET | Freq: Every day | ORAL | Status: DC
  Administered 2012-08-11 – 2012-08-13 (×3): 75 mg via ORAL
  Filled 2012-08-11 (×3): qty 1

## 2012-08-11 MED ORDER — METOCLOPRAMIDE HCL 10 MG PO TABS
10.0000 mg | ORAL_TABLET | Freq: Four times a day (QID) | ORAL | Status: DC | PRN
  Filled 2012-08-11: qty 1

## 2012-08-11 MED ORDER — OXYCODONE HCL 5 MG PO TABS
10.0000 mg | ORAL_TABLET | Freq: Three times a day (TID) | ORAL | Status: DC | PRN
  Administered 2012-08-11 (×2): 10 mg via ORAL
  Filled 2012-08-11 (×2): qty 2

## 2012-08-11 MED ORDER — PANCRELIPASE (LIP-PROT-AMYL) 12000-38000 UNITS PO CPEP
2.0000 | ORAL_CAPSULE | Freq: Three times a day (TID) | ORAL | Status: DC
  Administered 2012-08-11 – 2012-08-13 (×8): 2 via ORAL
  Filled 2012-08-11 (×10): qty 2

## 2012-08-11 MED ORDER — WHITE PETROLATUM GEL
Status: AC
  Administered 2012-08-11: 0.2
  Filled 2012-08-11: qty 5

## 2012-08-11 MED ORDER — PANTOPRAZOLE SODIUM 40 MG PO TBEC
40.0000 mg | DELAYED_RELEASE_TABLET | Freq: Every day | ORAL | Status: DC
  Administered 2012-08-11 – 2012-08-13 (×3): 40 mg via ORAL
  Filled 2012-08-11 (×3): qty 1

## 2012-08-11 MED ORDER — ASPIRIN 300 MG RE SUPP: 300.0000 mg | RECTAL | Status: DC

## 2012-08-11 MED ORDER — POTASSIUM CHLORIDE CRYS ER 20 MEQ PO TBCR
40.0000 meq | EXTENDED_RELEASE_TABLET | Freq: Two times a day (BID) | ORAL | Status: AC
  Administered 2012-08-11 (×2): 40 meq via ORAL
  Filled 2012-08-11 (×2): qty 2

## 2012-08-11 MED ORDER — ATORVASTATIN CALCIUM 40 MG PO TABS
40.0000 mg | ORAL_TABLET | Freq: Every day | ORAL | Status: DC
  Administered 2012-08-11 – 2012-08-12 (×2): 40 mg via ORAL
  Filled 2012-08-11 (×3): qty 1

## 2012-08-11 MED ORDER — CLOTRIMAZOLE 1 % EX CREA
1.0000 "application " | TOPICAL_CREAM | Freq: Two times a day (BID) | CUTANEOUS | Status: DC
  Administered 2012-08-11 – 2012-08-13 (×6): 1 via TOPICAL
  Filled 2012-08-11: qty 15

## 2012-08-11 MED ORDER — INSULIN ASPART 100 UNIT/ML ~~LOC~~ SOLN: 0.0000 [IU] | SUBCUTANEOUS | Status: DC

## 2012-08-11 MED ORDER — ASPIRIN EC 81 MG PO TBEC
81.0000 mg | DELAYED_RELEASE_TABLET | Freq: Every day | ORAL | Status: DC
  Administered 2012-08-11 – 2012-08-13 (×3): 81 mg via ORAL
  Filled 2012-08-11 (×3): qty 1

## 2012-08-11 MED ORDER — CLOPIDOGREL BISULFATE 75 MG PO TABS
75.0000 mg | ORAL_TABLET | Freq: Once | ORAL | Status: AC
  Administered 2012-08-11: 75 mg via ORAL
  Filled 2012-08-11: qty 1

## 2012-08-11 MED ORDER — ASPIRIN 81 MG PO CHEW: 324.0000 mg | CHEWABLE_TABLET | ORAL | Status: DC

## 2012-08-11 MED ORDER — ENOXAPARIN SODIUM 40 MG/0.4ML ~~LOC~~ SOLN
40.0000 mg | Freq: Every day | SUBCUTANEOUS | Status: DC
  Administered 2012-08-12 – 2012-08-13 (×2): 40 mg via SUBCUTANEOUS
  Filled 2012-08-11 (×2): qty 0.4

## 2012-08-11 MED ORDER — QUETIAPINE FUMARATE 25 MG PO TABS
25.0000 mg | ORAL_TABLET | Freq: Every day | ORAL | Status: DC
  Administered 2012-08-11 – 2012-08-12 (×3): 25 mg via ORAL
  Filled 2012-08-11 (×4): qty 1

## 2012-08-11 MED ORDER — MEGESTROL ACETATE 40 MG/ML PO SUSP
400.0000 mg | Freq: Every day | ORAL | Status: DC
Start: 2012-08-11 — End: 2012-08-13
  Administered 2012-08-11 – 2012-08-13 (×3): 400 mg via ORAL
  Filled 2012-08-11 (×3): qty 10

## 2012-08-11 MED ORDER — ACETAMINOPHEN 325 MG PO TABS
650.0000 mg | ORAL_TABLET | ORAL | Status: DC | PRN
  Administered 2012-08-11 (×2): 650 mg via ORAL
  Filled 2012-08-11 (×2): qty 2

## 2012-08-11 MED ORDER — MIRTAZAPINE 15 MG PO TABS
15.0000 mg | ORAL_TABLET | Freq: Every day | ORAL | Status: DC
  Administered 2012-08-11 – 2012-08-12 (×3): 15 mg via ORAL
  Filled 2012-08-11 (×4): qty 1

## 2012-08-11 MED ORDER — METOPROLOL TARTRATE 12.5 MG HALF TABLET
12.5000 mg | ORAL_TABLET | Freq: Two times a day (BID) | ORAL | Status: DC
  Administered 2012-08-11 – 2012-08-13 (×6): 12.5 mg via ORAL
  Filled 2012-08-11 (×7): qty 1

## 2012-08-11 MED ORDER — NITROGLYCERIN 0.4 MG SL SUBL: 0.4000 mg | SUBLINGUAL_TABLET | SUBLINGUAL | Status: DC | PRN

## 2012-08-11 MED ORDER — METFORMIN HCL 500 MG PO TABS
500.0000 mg | ORAL_TABLET | Freq: Every day | ORAL | Status: DC
  Filled 2012-08-11: qty 1

## 2012-08-11 MED ORDER — INSULIN ASPART 100 UNIT/ML ~~LOC~~ SOLN
0.0000 [IU] | Freq: Three times a day (TID) | SUBCUTANEOUS | Status: DC
  Administered 2012-08-11: 3 [IU] via SUBCUTANEOUS
  Administered 2012-08-11: 2 [IU] via SUBCUTANEOUS
  Administered 2012-08-12 (×2): 3 [IU] via SUBCUTANEOUS

## 2012-08-11 NOTE — Progress Notes (Signed)
12:00 noon Came to ambulate, patient resting, states she just had pain medication, still having some mid sternal CP.  Requested to rest.  Discussed the above with patients nurse.

## 2012-08-11 NOTE — Progress Notes (Signed)
Clarified in chart that patient has been followed by [facility] in the past (Dr. Allyson Sabal) -- Dr. Swaziland just performed cath yesterday. Will keep attending as Dr. Allyson Sabal. Call with questions. Dayna Dunn PA-C, [facility]

## 2012-08-11 NOTE — Progress Notes (Signed)
[facility] PROGRESS NOTE  [name] Foley YNW:[id number] DOB: 01/28/1946 DOA: 08/10/2012 PCP: Dorrene German, MD  Assessment/Plan: #1 DM2- continue SSI coverage ordered for elevated Blood glucose levels, follow andResume metformin when appropriate.  #2 HTN- metoprolol, per her primary team.  #3 CAD- Cardiac Cath done by Cards 08/10/2012, Stent placement X 1 in LAD . Placed on Plavix , ASA, by Cards.  #4 Anemia- hgb stable, follow anemia panel.  #5Hypokalemia- resolved, Magnesium level within normal limits  #6Tobacco Use Disorder- Tobacco cessation discussed 10/18, nicotine patch while hospitalized.     Code Status: Full code Family Communication: Directly with patient at bedside Disposition Plan: Per primary team  HPI/Subjective: Complaining of some chest pain this a.m., but states much less than yesterday. Denies nausea or vomiting  Objective: Filed Vitals:   08/11/12 0437 08/11/12 0500 08/11/12 0600 08/11/12 0700  BP:  127/80 137/88 140/83  Pulse:  65 67 71  Temp:      TempSrc:      Resp:  20 27 22   Height:      Weight: 72.8 kg (160 lb 7.9 oz)     SpO2:  98% 98% 94%    Intake/Output Summary (Last 24 hours) at 08/11/12 1020 Last data filed at 08/11/12 0700  Gross per 24 hour  Intake 494.88 ml  Output   1000 ml  Net -505.12 ml   Filed Weights   08/10/12 2216 08/11/12 0045 08/11/12 0437  Weight: 73.5 kg (162 lb 0.6 oz) 73.9 kg (162 lb 14.7 oz) 72.8 kg (160 lb 7.9 oz)    Exam:   General: Elderly  Female, alert and oriented x3 no apparent distress  Cardiovascular: Regular rate and rhythm, normal S1-S2  Respiratory: Clear to auscultation bilaterally, no wheezes  Abdomen: Soft, bowel sounds present nontender nondistended no organomegaly and no masses palpable  Data Reviewed: Basic Metabolic Panel:  Lab 08/11/12 6578 08/11/12 0047 08/10/12 2222 08/10/12 2207  NA 139 139 143 140  K 3.8 3.5 3.0* 3.0*  CL 107 105 111 109  CO2 22 21 -- 18*  GLUCOSE  110* 122* 159* 166*  BUN 7 9 9 10   CREATININE 0.62 0.64 0.70 0.67  CALCIUM 8.6 9.0 -- 8.5  MG -- 1.8 -- --  PHOS -- -- -- --   Liver Function Tests:  Lab 08/11/12 0047 08/10/12 2207  AST 44* 21  ALT 19 14  ALKPHOS 252* 213*  BILITOT 0.2* 0.2*  PROT 7.3 6.3  ALBUMIN 3.2* 2.8*    Lab 08/10/12 2207  LIPASE 5*  AMYLASE --   No results found for this basename: AMMONIA:5 in the last 168 hours CBC:  Lab 08/11/12 0520 08/11/12 0047 08/10/12 2222 08/10/12 2207  WBC 9.8 11.8* -- 12.3*  NEUTROABS -- 9.0* -- 4.3  HGB 11.0* 11.8* 11.2* 10.5*  HCT 33.2* 35.3* 33.0* 31.7*  MCV 67.5* 68.3* -- 68.3*  PLT 359 398 -- 434*   Cardiac Enzymes:  Lab 08/11/12 0520 08/11/12 0046  CKTOTAL -- --  CKMB -- --  CKMBINDEX -- --  TROPONINI >20.00* 3.27*   BNP (last 3 results)  Basename 08/11/12 0046 08/10/12 2207 12/02/11 2028  PROBNP 163.0* 139.0* 75.9   CBG:  Lab 08/11/12 0744 08/11/12 0432  GLUCAP 97 106*    Recent Results (from the past 240 hour(s))  MRSA PCR SCREENING     Status: Normal   Collection Time   08/11/12  1:14 AM      Component Value Range Status Comment  MRSA by PCR NEGATIVE  NEGATIVE Final      Studies: Dg Chest Port 1 View  08/11/2012  *RADIOLOGY REPORT*  Clinical Data: Chest pain, shortness of breath  PORTABLE CHEST - 1 VIEW  Comparison: 12/02/2011  Findings: Lungs are essentially clear.  Mild bibasilar atelectasis. No pleural effusion or pneumothorax.  Cardiomediastinal silhouette is within normal limits.  IMPRESSION: No evidence of acute cardiopulmonary disease.   Original Report Authenticated By: Charline Bills, M.D.     Scheduled Meds:   . aspirin  324 mg Oral Once  . aspirin EC  81 mg Oral Daily  . atorvastatin  40 mg Oral q1800  . bivalirudin      . bivalirudin      . clonazePAM  1 mg Oral QHS  . clopidogrel      . clopidogrel  75 mg Oral Q breakfast  . clopidogrel  75 mg Oral Once  . clotrimazole  1 application Topical BID  . enoxaparin  40  mg Subcutaneous Daily  . fentaNYL      . fentaNYL  50 mcg Intravenous Once  . heparin      . heparin  4,000 Units Intravenous STAT  . insulin aspart  0-15 Units Subcutaneous TID WC  . insulin aspart  0-9 Units Subcutaneous Q4H  . lidocaine      . lipase/protease/amylase  2 capsule Oral TID AC  . megestrol  400 mg Oral Daily  . metoprolol tartrate  12.5 mg Oral BID  . midazolam      . mirtazapine  15 mg Oral QHS  . nitroGLYCERIN      . nitroGLYCERIN      . ondansetron (ZOFRAN) IV  4 mg Intravenous Once  . pantoprazole  40 mg Oral Daily  . potassium chloride  40 mEq Oral BID  . QUEtiapine  25 mg Oral QHS  . verapamil      . DISCONTD: aspirin  324 mg Oral NOW  . DISCONTD: aspirin  300 mg Rectal NOW  . DISCONTD: metFORMIN  500 mg Oral Q breakfast   Continuous Infusions:   . sodium chloride 20 mL/hr at 08/10/12 2222  . sodium chloride 1 mL/kg/hr (08/11/12 0105)    Principal Problem:  *STEMI (ST elevation myocardial infarction) Active Problems:  DIABETES MELLITUS II, UNCOMPLICATED  HYPERLIPIDEMIA  TOBACCO DEPENDENCE  HYPERTENSION, BENIGN SYSTEMIC  CAD- LAD DES 2007, patent 2008  ALCOHOL ABUSE, HX OF    Time spent:    Kela Millin  [facility] Pager [phone]. If 8PM-8AM, please contact night-coverage at [email or web address], password [facility] 08/11/2012, 10:20 AM  LOS: 1 day

## 2012-08-11 NOTE — Progress Notes (Signed)
Chaplain Note:  Chaplain responded to Code STEMI page, joining Cath Lab team in treatment room.  Pt arrived from [facility] with no family.  Chaplain provided spiritual comfort, support, and prayer for pt and Cath Lab staff.  Staff expressed appreciation for chaplain support.  Chaplain will refer case to next chaplain on call.  08/10/12 2219  Clinical Encounter Type  Visited With Patient;Health care provider  Visit Type Spiritual support  Referral From Other (Comment) (Code STEMI page)  Spiritual Encounters  Spiritual Needs Emotional  Stress Factors  Patient Stress Factors Major life changes;Health changes  Family Stress Factors None identified (No family present)   Verdie Shire, Chaplain [phone]

## 2012-08-11 NOTE — Progress Notes (Signed)
[facility]  Subjective: Complaining of a little chest pain still.  Objective: Vital signs in last 24 hours: Temp:  [97.7 F (36.5 C)-98 F (36.7 C)] 98 F (36.7 C) (10/19 0400) Pulse Rate:  [63-79] 71  (10/19 0700) Resp:  [16-32] 22  (10/19 0700) BP: (102-140)/(49-90) 140/83 mmHg (10/19 0700) SpO2:  [94 %-100 %] 94 % (10/19 0700) FiO2 (%):  [100 %] 100 % (10/18 2211) Weight:  [72.8 kg (160 lb 7.9 oz)-73.9 kg (162 lb 14.7 oz)] 72.8 kg (160 lb 7.9 oz) (10/19 0437)    Intake/Output from previous day: 10/18 0701 - 10/19 0700 In: 494.9 [P.O.:60; I.V.:434.9] Out: 1000 [Urine:1000] Intake/Output this shift:    Medications Current Facility-Administered Medications  Medication Dose Route Frequency Provider Last Rate Last Dose  . 0.9 %  sodium chloride infusion   Intravenous Continuous Hurman Horn, MD 20 mL/hr at 08/10/12 2222    . 0.9 %  sodium chloride infusion  1 mL/kg/hr Intravenous Continuous Peter M [place], MD 73.5 mL/hr at 08/11/12 0105 1 mL/kg/hr at 08/11/12 0105  . acetaminophen (TYLENOL) tablet 650 mg  650 mg Oral Q4H PRN Runell Gess, MD   650 mg at 08/11/12 0118  . aspirin chewable tablet 324 mg  324 mg Oral Once Hurman Horn, MD   324 mg at 08/10/12 2226  . aspirin EC tablet 81 mg  81 mg Oral Daily Runell Gess, MD      . atorvastatin (LIPITOR) tablet 40 mg  40 mg Oral q1800 Runell Gess, MD      . bivalirudin (ANGIOMAX) 250 MG injection           . bivalirudin (ANGIOMAX) 250 MG injection           . clonazePAM (KLONOPIN) tablet 1 mg  1 mg Oral QHS Runell Gess, MD   1 mg at 08/11/12 0118  . clopidogrel (PLAVIX) 300 MG tablet           . clopidogrel (PLAVIX) tablet 75 mg  75 mg Oral Q breakfast Runell Gess, MD      . clopidogrel (PLAVIX) tablet 75 mg  75 mg Oral Once Runell Gess, MD   75 mg at 08/11/12 0119  . clotrimazole (LOTRIMIN) 1 % cream 1 application  1 application Topical BID Runell Gess, MD   1  application at 08/11/12 0125  . enoxaparin (LOVENOX) injection 40 mg  40 mg Subcutaneous Daily Peter M [place], MD      . fentaNYL (SUBLIMAZE) 0.05 MG/ML injection           . fentaNYL (SUBLIMAZE) injection 50 mcg  50 mcg Intravenous Once Hurman Horn, MD   50 mcg at 08/10/12 2225  . heparin 2-0.9 UNIT/ML-% infusion           . heparin injection 4,000 Units  4,000 Units Intravenous STAT Hurman Horn, MD   4,000 Units at 08/10/12 2221  . insulin aspart (novoLOG) injection 0-15 Units  0-15 Units Subcutaneous TID WC Runell Gess, MD      . insulin aspart (novoLOG) injection 0-9 Units  0-9 Units Subcutaneous Q4H Harvette C Jenkins, MD      . lidocaine (XYLOCAINE) 1 % injection           . lipase/protease/amylase (CREON-10/PANCREASE) capsule 2 capsule  2 capsule Oral TID AC Runell Gess, MD      . megestrol (MEGACE) 40 MG/ML suspension 400 mg  400 mg Oral Daily Runell Gess, MD      . metoCLOPramide (REGLAN) tablet 10 mg  10 mg Oral Q6H PRN Runell Gess, MD      . metoprolol tartrate (LOPRESSOR) tablet 12.5 mg  12.5 mg Oral BID Runell Gess, MD   12.5 mg at 08/11/12 0118  . midazolam (VERSED) 2 MG/2ML injection           . mirtazapine (REMERON) tablet 15 mg  15 mg Oral QHS Runell Gess, MD   15 mg at 08/11/12 0119  . nitroGLYCERIN (NITROSTAT) SL tablet 0.4 mg  0.4 mg Sublingual Q5 Min x 3 PRN Runell Gess, MD      . nitroGLYCERIN (NTG ON-CALL) 0.2 mg/mL injection           . nitroGLYCERIN 0.2 mg/mL in dextrose 5 % infusion           . ondansetron (ZOFRAN) injection 4 mg  4 mg Intravenous Once Hurman Horn, MD   4 mg at 08/10/12 2225  . ondansetron (ZOFRAN) injection 4 mg  4 mg Intravenous Q6H PRN Runell Gess, MD      . oxyCODONE (Oxy IR/ROXICODONE) immediate release tablet 10 mg  10 mg Oral Q8H PRN Runell Gess, MD   10 mg at 08/11/12 0739  . pantoprazole (PROTONIX) EC tablet 40 mg  40 mg Oral Daily Runell Gess, MD      . potassium chloride SA  (K-DUR,KLOR-CON) CR tablet 40 mEq  40 mEq Oral BID Ron Parker, MD   40 mEq at 08/11/12 0305  . QUEtiapine (SEROQUEL) tablet 25 mg  25 mg Oral QHS Runell Gess, MD   25 mg at 08/11/12 0118  . verapamil (ISOPTIN) 2.5 MG/ML injection           . DISCONTD: aspirin chewable tablet 324 mg  324 mg Oral NOW Runell Gess, MD      . DISCONTD: aspirin suppository 300 mg  300 mg Rectal NOW Runell Gess, MD      . DISCONTD: metFORMIN (GLUCOPHAGE) tablet 500 mg  500 mg Oral Q breakfast Runell Gess, MD        PE: General appearance: alert, cooperative and no distress Lungs: clear to auscultation bilaterally Heart: regular rate and rhythm, S1, S2 normal, no murmur, click, rub or gallop Extremities: No LEE Pulses: 2+ and symmetric Neurologic: Grossly normal  Lab Results:   Basename 08/11/12 0520 08/11/12 0047 08/10/12 2222 08/10/12 2207  WBC 9.8 11.8* -- 12.3*  HGB 11.0* 11.8* 11.2* --  HCT 33.2* 35.3* 33.0* --  PLT 359 398 -- 434*   BMET  Basename 08/11/12 0520 08/11/12 0047 08/10/12 2222 08/10/12 2207  NA 139 139 143 --  K 3.8 3.5 3.0* --  CL 107 105 111 --  CO2 22 21 -- 18*  GLUCOSE 110* 122* 159* --  BUN 7 9 9  --  CREATININE 0.62 0.64 0.70 --  CALCIUM 8.6 9.0 -- 8.5   PT/INR  Basename 08/10/12 2207  LABPROT 15.9*  INR 1.30   Cholesterol  Basename 08/11/12 0520  CHOL 89   Cardiac Enzymes No components found with this basename: TROPONIN:3, CKMB:3  Studies/Results: PROCEDURAL FINDINGS  Hemodynamics:  AO 142/79 with a mean of 106 mmHg  LV 142/16 mmHg  Coronary angiography:  Coronary dominance: right  Left mainstem: Normal.  Left anterior descending (LAD): The LAD is occluded after the takeoff of a small first diagonal branch  and the septal perforator branch. This is a site of prior stent. There is no collateral flow noted.  Left circumflex (LCx): The left circumflex is a very large vessel which gives rise to a large bifurcating first obtuse marginal  vessel and then terminates in a second marginal branch. There is 20% narrowing in the first marginal branch.  Right coronary artery (RCA): The right coronary is a large dominant vessel. It has mild wall irregularities less than 10%.  Left ventriculography: performed at the end of the procedure, the left ventricle is normal in size with hypokinesis of the apex. Overall left ventricle systolic function is well-preserved with ejection fraction estimated at 55%. There is no significant mitral insufficiency.  PCI Note: Following the diagnostic procedure, the decision was made to proceed with PCI. The radial sheath was upsized to a 6 [place]. Weight-based bivalirudin was given for anticoagulation. Plavix 600 mg po was given. Once a therapeutic ACT was achieved, a 6 [place] XB LAD guide catheter was inserted. The LAD occlusion was difficult to cross with a wire. We are unable to cross with a prowater or an [employer] wire. A whisper coronary guidewire was used to cross the lesion. We used a Pronto extraction catheter initially with good reperfusion. We obtained a fair amount of thrombus. At this point there appeared to be significant stenosis in the distal portion of the prior stent. The lesion was predilated with a 2.5 mm balloon. It appeared that the lesion was within the prior stent so we dilated next with a 3.5 mm noncompliant balloon to 18 atmospheres. There still appeared to be some narrowing of the distal stent. We performed intravascular ultrasound which demonstrated a distal reference lumen diameter of 4 mm. The stent appeared to be well apposed. In the distal portion of the stent there was significant atherosclerotic disease within the stent with suboptimal result. We then dilated the lesion with a 4.0 mm noncompliant balloon. At this point there was a focal edge dissection distally. The lesion was then stented with a 4.0 x 16 mm Promus stent overlapping approximately half of the prior stent.. The stent was  postdilated with a 4.0 mm noncompliant balloon. Following PCI, there was 10% residual stenosis and TIMI-3 flow. Final angiography confirmed an excellent result. The patient tolerated the procedure well. There were no immediate procedural complications. A TR band was used for radial hemostasis. The patient was transferred to the post catheterization recovery area for further monitoring.  PCI Data:  Vessel - LAD/Segment - mid  Percent Stenosis (pre) 100%  TIMI-flow 0  Stent 4.0 x 16 mm Promus  Percent Stenosis (post) less than 10%  TIMI-flow (post) 3  Final Conclusions:  1. Single vessel occlusive coronary disease. Patient had occlusion of the mid LAD at the site of a previous DES.  2. Well-preserved left ventricular systolic function.  3. Successful intracoronary stenting of the mid LAD with a drug-eluting stent.  Recommendations: dual antiplatelet therapy for at least one year.  Theron Arista [facility]  08/11/2012, 12:10 AM    Assessment/Plan  Principal Problem:  *STEMI (ST elevation myocardial infarction) Active Problems:  DIABETES MELLITUS II, UNCOMPLICATED  HYPERLIPIDEMIA  TOBACCO DEPENDENCE  HYPERTENSION, BENIGN SYSTEMIC  CAD- LAD DES 2007, patent 2008  ALCOHOL ABUSE, HX OF  Plan:  S/P successful placement of DES to LAD at previous stent site.  Preserved EF.  BP and HR stable.   Troponin >20.  We will continue to cycle until trending down.     LOS: 1  day    HAGER, BRYAN 08/11/2012 7:50 AM   Agree with note written by Jones Skene PAC  S/P STEMI, LAD intervention with DES for occluded mid LAD stent. EF preserved (distal anteroapical Hypo). Done radially. Looks good. No CP. Exam benign. Will keep in step down for 1 more day , then out to tele and prob home on Monday. DAPT.  Runell Gess 08/11/2012 8:08 AM

## 2012-08-11 NOTE — ED Provider Notes (Signed)
History     CSN: [id number]  Arrival date & time 08/10/12  2207   First MD Initiated Contact with Patient 08/10/12 2213      Chief Complaint  Patient presents with  . Chest Pain  . Code STEMI    (Consider location/radiation/quality/duration/timing/severity/associated sxs/prior treatment) HPI 66 year old female has a history of coronary disease with prior stenting, she also has chronic abdominal pain from chronic pancreatitis, she has diabetes, she had recent stenting of a common bile duct to try to help her chronic abdominal pain, she now presents with a one-hour history of gradual onset diffuse chest pressure squeezing tightness aching heaviness with diaphoresis, nausea, and shortness of breath. She vomited the nitroglycerin and aspirin from EMS. Her pain is moderately severe just prior to arrival and after morphine is now moderately severe again but improving somewhat.  A code STEMI was called prior arrival by EMS due to the patient having 3 mm ST elevation in lead V2 as well as approximately 1 mm ST elevation in leads V1, V3, and V4 without ST depression noted, a repeat ECG just prior to arrival by EMS showed resolution of ST elevation in the precordial leads except for V2 and the ST elevation in lead V2 upon arrival was less than 2 mm from the EMS ECG which was repeated just prior to patient arrival. Past Medical History  Diagnosis Date  . Diabetes mellitus   . 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 Coronary stent occlusion   . GERD (gastroesophageal reflux disease)   . Hypertension     no medication currently B/P has been ok  . Pneumonia     12/02/11 pneumonia  . Stented coronary artery   . Tobacco dependence   . Alcohol abuse     abstinent since 2009  . Insomnia   . Psoriasis   . Hemorrhoids   . Colon adenoma 04/09/2012    diminutive cecal  . Coronary artery disease     Past Surgical History  Procedure Date  . Cholecystectomy   . Abdominal  hysterectomy   . Cornary stent placement   . Eus 01/05/2012    Procedure: UPPER ENDOSCOPIC ULTRASOUND (EUS) LINEAR;  Surgeon: Rachael Fee, MD;  Location: [facility];  Service: Endoscopy;  Laterality: N/A;  radial linear  . Colonoscopy     multiple  . Ercp 07/25/2012    Procedure: ENDOSCOPIC RETROGRADE CHOLANGIOPANCREATOGRAPHY (ERCP);  Surgeon: Iva Boop, MD;  Location: [facility];  Service: Endoscopy;  Laterality: N/A;  with stent    Family History  Problem Relation Age of Onset  . Malignant hyperthermia Neg Hx   . Colon cancer      History  Substance Use Topics  . Smoking status: Current Every Day Smoker -- 0.5 packs/day for 15 years  . Smokeless tobacco: Never Used   Comment: TRYING TO CUT BACK.  Marland Kitchen Alcohol Use: No    OB History    Grav Para Term Preterm Abortions TAB SAB Ect Mult Living                  Review of Systems 10 Systems reviewed and are negative for acute change except as noted in the HPI. Allergies  Diclofenac sodium and Other  Home Medications  No current outpatient prescriptions on file.  BP 123/82  Pulse 77  Temp 98.9 F (37.2 C) (Oral)  Resp 22  Ht 5\' 2"  (1.575 m)  Wt 160 lb 7.9 oz (72.8 kg)  BMI 29.35 kg/m2  SpO2 95%  Physical Exam  Nursing note and vitals reviewed. Constitutional:       Awake, alert, nontoxic appearance.  HENT:  Head: Atraumatic.  Eyes: Right eye exhibits no discharge. Left eye exhibits no discharge.  Neck: Neck supple.  Cardiovascular: Normal rate and regular rhythm.   No murmur heard. Pulmonary/Chest: Effort normal and breath sounds normal. No respiratory distress. She has no wheezes. She has no rales. She exhibits no tenderness.  Abdominal: Soft. Bowel sounds are normal. She exhibits no distension and no mass. There is tenderness. There is no rebound and no guarding.       Mildly tender epigastrium without rebound and patient states this is baseline tenderness for her  Musculoskeletal: She exhibits no  tenderness.       Baseline ROM, no obvious new focal weakness.  Neurological: She is alert.       Mental status and motor strength appears baseline for patient and situation.  Skin: No rash noted.  Psychiatric: She has a normal mood and affect.    ED Course  Procedures (including critical care time) ECG: Sinus rhythm, ventricular rate 59, normal axis, normal intervals, lead 32 has 2 mm ST elevation but remaining leads did not demonstrate ST elevation, she does have slight less than 1 mm ST depression in the inferior leads, compared to February 2013 the ST elevation in lead V2 is new in the ST elevations that were present in the prehospital ECG were new compared to the February 2013 ECG.   Pt taken emergently to cath lab. CRITICAL CARE Performed by: Hurman Horn   Total critical care time:  Critical care time was exclusive of separately billable procedures and treating other patients.  Critical care was necessary to treat or prevent imminent or life-threatening deterioration.  Critical care was time spent personally by me on the following activities: development of treatment plan with patient and/or surrogate as well as nursing, discussions with consultants, evaluation of patient's response to treatment, examination of patient, obtaining history from patient or surrogate, ordering and performing treatments and interventions, ordering and review of laboratory studies, ordering and review of radiographic studies, pulse oximetry and re-evaluation of patient's condition. Labs Reviewed  CBC - Abnormal; Notable for the following:    WBC 12.3 (*)     Hemoglobin 10.5 (*)     HCT 31.7 (*)     MCV 68.3 (*)     MCH 22.6 (*)     RDW 18.3 (*)     Platelets 434 (*)     All other components within normal limits  DIFFERENTIAL - Abnormal; Notable for the following:    Neutrophils Relative 35 (*)     Lymphocytes Relative 51 (*)     Lymphs Abs 6.3 (*)     Monocytes Absolute 1.1 (*)     All  other components within normal limits  PROTIME-INR - Abnormal; Notable for the following:    Prothrombin Time 15.9 (*)     All other components within normal limits  COMPREHENSIVE METABOLIC PANEL - Abnormal; Notable for the following:    Potassium 3.0 (*)     CO2 18 (*)     Glucose, Bld 166 (*)     Albumin 2.8 (*)     Alkaline Phosphatase 213 (*)     Total Bilirubin 0.2 (*)     All other components within normal limits  PRO B NATRIURETIC PEPTIDE - Abnormal; Notable for the following:    Pro  B Natriuretic peptide (BNP) 139.0 (*)     All other components within normal limits  POCT I-STAT, CHEM 8 - Abnormal; Notable for the following:    Potassium 3.0 (*)     Glucose, Bld 159 (*)     Calcium, Ion 1.11 (*)     Hemoglobin 11.2 (*)     HCT 33.0 (*)     All other components within normal limits  LIPASE, BLOOD - Abnormal; Notable for the following:    Lipase 5 (*)     All other components within normal limits  PLATELET INHIBITION P2Y12 - Abnormal; Notable for the following:    Platelet Function  P2Y12 179 (*)     All other components within normal limits  TROPONIN I - Abnormal; Notable for the following:    Troponin I 3.27 (*)     All other components within normal limits  TROPONIN I - Abnormal; Notable for the following:    Troponin I >20.00 (*)  CRITICAL VALUE NOTED.  VALUE IS CONSISTENT WITH PREVIOUSLY REPORTED AND CALLED VALUE.   All other components within normal limits  TROPONIN I - Abnormal; Notable for the following:    Troponin I >20.00 (*)     All other components within normal limits  CBC WITH DIFFERENTIAL - Abnormal; Notable for the following:    WBC 11.8 (*)     RBC 5.17 (*)     Hemoglobin 11.8 (*)     HCT 35.3 (*)     MCV 68.3 (*)     MCH 22.8 (*)     RDW 18.3 (*)     Neutro Abs 9.0 (*)     All other components within normal limits  COMPREHENSIVE METABOLIC PANEL - Abnormal; Notable for the following:    Glucose, Bld 122 (*)     Albumin 3.2 (*)     AST 44 (*)       Alkaline Phosphatase 252 (*)     Total Bilirubin 0.2 (*)     All other components within normal limits  PRO B NATRIURETIC PEPTIDE - Abnormal; Notable for the following:    Pro B Natriuretic peptide (BNP) 163.0 (*)     All other components within normal limits  HEMOGLOBIN A1C - Abnormal; Notable for the following:    Hemoglobin A1C 6.0 (*)     Mean Plasma Glucose 126 (*)     All other components within normal limits  LIPID PANEL - Abnormal; Notable for the following:    HDL 29 (*)     All other components within normal limits  CBC - Abnormal; Notable for the following:    Hemoglobin 11.0 (*)     HCT 33.2 (*)     MCV 67.5 (*)     MCH 22.4 (*)     RDW 18.1 (*)     All other components within normal limits  BASIC METABOLIC PANEL - Abnormal; Notable for the following:    Glucose, Bld 110 (*)     All other components within normal limits  IRON AND TIBC - Abnormal; Notable for the following:    Iron 34 (*)     Saturation Ratios 13 (*)     All other components within normal limits  GLUCOSE, CAPILLARY - Abnormal; Notable for the following:    Glucose-Capillary 106 (*)     All other components within normal limits  GLUCOSE, CAPILLARY - Abnormal; Notable for the following:    Glucose-Capillary 135 (*)  All other components within normal limits  GLUCOSE, CAPILLARY - Abnormal; Notable for the following:    Glucose-Capillary 190 (*)     All other components within normal limits  APTT  POCT I-STAT TROPONIN I  TSH  MAGNESIUM  MRSA PCR SCREENING  RETICULOCYTES  GLUCOSE, CAPILLARY  CBC  VITAMIN B12  FOLATE  FERRITIN  TROPONIN I  BASIC METABOLIC PANEL  CBC   Dg Chest Port 1 View  08/11/2012  *RADIOLOGY REPORT*  Clinical Data: Chest pain, shortness of breath  PORTABLE CHEST - 1 VIEW  Comparison: 12/02/2011  Findings: Lungs are essentially clear.  Mild bibasilar atelectasis. No pleural effusion or pneumothorax.  Cardiomediastinal silhouette is within normal limits.  IMPRESSION:  No evidence of acute cardiopulmonary disease.   Original Report Authenticated By: Charline Bills, M.D.      1. STEMI (ST elevation myocardial infarction)   2. Coronary atherosclerosis of unspecified type of vessel, native or graft   3. Type II or unspecified type diabetes mellitus without mention of complication, not stated as uncontrolled   4. Essential hypertension, benign   5. Tobacco use disorder       MDM  Patient / Family / Caregiver informed of clinical course, understand medical decision-making process, and agree with plan.         Hurman Horn, MD 08/11/12 [phone]

## 2012-08-11 NOTE — CV Procedure (Signed)
Cardiac Catheterization Procedure Note  Name: [name] Foley MRN: [id number] DOB: [birth year]  Procedure: Left Heart Cath, Selective Coronary Angiography, LV angiography, PTCA and stenting of the LAD, intravascular ultrasound.  Indication: 66 year old black female with history of coronary disease status post stenting of the mid LAD with a Taxus stent in 2007. She presents with an acute anterior ST elevation myocardial infarction. She has 2 mm of ST elevation in lead V2. She has one half to 1 mm of ST elevation in leads aVL, V1, and V3. She has ongoing severe chest pain.  Procedural Details:  The right wrist was prepped, draped, and anesthetized with 1% lidocaine. Using the modified Seldinger technique, a 6 French sheath was introduced into the right radial artery. 3 mg of verapamil was administered through the sheath, weight-based unfractionated heparin was administered intravenously. Standard Judkins catheters were used for selective coronary angiography and left ventriculography. Catheter exchanges were performed over an exchange length guidewire.  PROCEDURAL FINDINGS Hemodynamics: AO 142/79 with a mean of 106 mmHg LV 142/16 mmHg   Coronary angiography: Coronary dominance: right  Left mainstem: Normal.  Left anterior descending (LAD): The LAD is occluded after the takeoff of a small first diagonal branch and the septal perforator branch. This is a site of prior stent. There is no collateral flow noted.  Left circumflex (LCx): The left circumflex is a very large vessel which gives rise to a large bifurcating first obtuse marginal vessel and then terminates in a second marginal branch. There is 20% narrowing in the first marginal branch.  Right coronary artery (RCA): The right coronary is a large dominant vessel. It has mild  wall irregularities less than 10%.  Left ventriculography:  performed at the end of the procedure, the left ventricle is normal in size with hypokinesis of the  apex. Overall left ventricle systolic function is well-preserved with ejection fraction estimated at 55%. There is no significant mitral insufficiency.   PCI Note:  Following the diagnostic procedure, the decision was made to proceed with PCI. The radial sheath was upsized to a 6 [place]. Weight-based bivalirudin was given for anticoagulation. Plavix 600 mg po was given. Once a therapeutic ACT was achieved, a 6 [place]  XB LAD guide catheter was inserted.  The LAD occlusion was difficult to cross with a wire. We are unable to cross with a prowater or an [employer] wire.  A  whisper coronary guidewire was used to cross the lesion.  We used a Pronto extraction catheter initially with good reperfusion. We obtained a fair amount of thrombus. At this point there appeared to be significant stenosis in the distal portion of the prior stent.  The lesion was predilated with a  2.5 mm balloon.  It appeared that the lesion was within the prior stent so we dilated next with a 3.5 mm noncompliant balloon to 18 atmospheres. There still appeared to be some narrowing of the distal stent. We performed intravascular ultrasound which demonstrated a distal reference lumen diameter of 4 mm. The stent appeared to be well apposed. In the distal portion of the stent there was significant atherosclerotic disease within the stent with suboptimal result. We then dilated the lesion with a 4.0 mm noncompliant balloon. At this point there was a focal edge dissection distally. The lesion was then stented with a  4.0 x 16 mm Promus stent overlapping approximately half of the prior stent..  The stent was postdilated with a  4.0 mm noncompliant balloon.  Following PCI, there was  10% residual stenosis and TIMI-3 flow. Final angiography confirmed an excellent result. The patient tolerated the procedure well. There were no immediate procedural complications. A TR band was used for radial hemostasis. The patient was transferred to the post  catheterization recovery area for further monitoring.  PCI Data: Vessel -  LAD/Segment -  mid Percent Stenosis (pre)  100% TIMI-flow  0 Stent  4.0 x 16 mm Promus Percent Stenosis (post)  less than 10% TIMI-flow (post)  3  Final Conclusions:    1. Single vessel occlusive coronary disease. Patient had occlusion of the mid LAD at the site of a previous DES. 2. Well-preserved left ventricular systolic function. 3. Successful intracoronary stenting of the mid LAD with a drug-eluting stent.   Recommendations: dual antiplatelet therapy for at least one year.  Theron Arista [facility] 08/11/2012, 12:10 AM

## 2012-08-11 NOTE — Progress Notes (Signed)
UR completed 

## 2012-08-11 NOTE — Consult Note (Addendum)
[facility] Medical Consultation  [name] Cina ZOX:[id number] DOB: November 02, 1945 DOA: 08/10/2012 PCP: Dorrene German, MD   Requesting physician: Dr. Nanetta Batty [facility]) Date of consultation: 08/11/2012 Reason for consultation: Medical Management  Impression/Recommendations CAD- Cardiac Cath done by Cards 08/10/2012, Stent placement X 1 in LAD .  Placed on Plavix , ASA, by Cards.   DM2- on Metformin at home , placed on hold for next 48 hours and SSI coverage ordered for elevated Blood glucose levels. HTN- Monitor and adjust BP meds as needed.   Anemia- Monitor Hb levels.  Send anemia panel.   Hypokalemia-  Check Magnesium level and replete K+.   Tobacco Use Disorder-  Tobacco cessation discussed, nicotine patch while hospitalized.   DVT prophylaxis  The [facility] team will followup again tomorrow.  Thank you for this consultation.    Chief Complaint: Chest Pain   HPI: [name] Foley is an 66 y.o. Female with multiple medical problems including CAD who presented to the ED with complaints of severe substernal chest pain that was unremitting and 10/10 and started at 9:30 pm.  Her EKG revealed a STEMI with elevations in the anterolateral leads, and she was taken emergently to the Cath lab and underwent  A PCI and Stent placement into the LAD.    The [facility] service was consulted to follow her and manage her medical conditions.      Review of Systems:  The patient denies anorexia, fever, weight loss, vision loss, decreased hearing, hoarseness, syncope, dyspnea on exertion, peripheral edema, balance deficits, hemoptysis, abdominal pain, melena, hematochezia, severe indigestion/heartburn, hematuria, incontinence, genital sores, muscle weakness, suspicious skin lesions, transient blindness, difficulty walking, depression, unusual weight change, abnormal bleeding, enlarged lymph nodes, angioedema, and breast masses.    Past Medical History  Diagnosis Date  .  Diabetes mellitus   . 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 Coronary stent occlusion   . GERD (gastroesophageal reflux disease)   . Hypertension     no medication currently B/P has been ok  . Pneumonia     12/02/11 pneumonia  . Stented coronary artery   . Tobacco dependence   . Alcohol abuse     abstinent since 2009  . Insomnia   . Psoriasis   . Hemorrhoids   . Colon adenoma 04/09/2012    diminutive cecal  . Coronary artery disease    Past Surgical History  Procedure Date  . Cholecystectomy   . Abdominal hysterectomy   . Cornary stent placement   . Eus 01/05/2012    Procedure: UPPER ENDOSCOPIC ULTRASOUND (EUS) LINEAR;  Surgeon: Rachael Fee, MD;  Location: [facility];  Service: Endoscopy;  Laterality: N/A;  radial linear  . Colonoscopy     multiple  . Ercp 07/25/2012    Procedure: ENDOSCOPIC RETROGRADE CHOLANGIOPANCREATOGRAPHY (ERCP);  Surgeon: Iva Boop, MD;  Location: [facility];  Service: Endoscopy;  Laterality: N/A;  with stent    Medications:  HOME MEDS: Prior to Admission medications   Medication Sig Start Date End Date Taking? Authorizing Provider  clonazePAM (KLONOPIN) 1 MG tablet Take 1 tablet (1 mg total) by mouth at bedtime. 07/13/12   Iva Boop, MD  clopidogrel (PLAVIX) 75 MG tablet Take 75 mg by mouth daily.      Historical Provider, MD  clotrimazole (LOTRIMIN) 1 % cream Apply 1 application topically 2 (two) times daily. Apply to affected area 2 times daily 06/11/12 06/11/13  Glynn Octave, MD  megestrol (MEGACE) 40 MG/ML suspension Take 400 mg by mouth daily.    Historical Provider, MD  metFORMIN (GLUCOPHAGE) 500 MG tablet Take 500 mg by mouth daily.      Historical Provider, MD  metoCLOPramide (REGLAN) 10 MG tablet Take 1 tablet (10 mg total) by mouth every 6 (six) hours as needed (nausea). 07/04/12 07/14/12  Dione Booze, MD  mirtazapine (REMERON) 15 MG tablet Take 1 tablet (15 mg total) by mouth at bedtime. 07/13/12   Iva Boop, MD  omeprazole (PRILOSEC) 40 MG capsule Take 1 capsule (40 mg total) by mouth daily. 30 minutes before breakfast 07/13/12   Iva Boop, MD  oxyCODONE (OXY IR/ROXICODONE) 5 MG immediate release tablet Take 2 tablets (10 mg total) by mouth every 8 (eight) hours as needed for pain (may take 2 tablets). 07/26/12   Shanda Bumps D. Zehr, PA  Pancrelipase, Lip-Prot-Amyl, 24000 UNITS CPEP Take 3 capsules by mouth 3 (three) times daily. 3 capsules with meals and 1 with snacks 05/24/12   Iva Boop, MD  QUEtiapine (SEROQUEL) 25 MG tablet Take 25 mg by mouth at bedtime.    Historical Provider, MD    Allergies  Allergen Reactions  . Diclofenac Sodium     REACTION: Chronic renal insufficiency.  . Other Other (See Comments)    Blackeyed peas and Pinto Beans...break out in hives    Social History:  reports that she has been smoking  And trying to cut down and she wants to quit.  She smokes 1/2 PPD.  She has never used smokeless tobacco. She reports that she does not drink alcohol, she quit drinking Alcohol in 1999,  She denies any history of illicit drug usage.     Family History  Problem Relation Age of Onset  . Malignant hyperthermia Neg Hx   . Colon cancer          Diabetes mellitis  In 2 Sisters   Physical Exam:  GEN:  Pleasant  66 year old elderly appearing well nourished and well developed African American Female examined  and in no acute distress; cooperative with exam Filed Vitals:   08/10/12 2216 08/10/12 2234 08/11/12 0045 08/11/12 0100  BP:   127/78 131/49  Pulse:  79 67 63  Temp:   97.7 F (36.5 C)   TempSrc:   Oral   Resp:   20 21  Height:   5\' 2"  (1.575 m)   Weight: 73.5 kg (162 lb 0.6 oz)  73.9 kg (162 lb 14.7 oz)   SpO2:   99% 96%   Blood pressure 131/49, pulse 63, temperature 97.7 F (36.5 C), temperature source Oral, resp. rate 21, height 5\' 2"  (1.575 m), weight 73.9 kg (162 lb 14.7 oz), SpO2 96.00%. PSYCH: SHe is alert and oriented x4; does not appear anxious  does not appear depressed; affect is normal HEENT: Normocephalic and Atraumatic, Mucous membranes pink; PERRLA; EOM intact; Fundi:  Benign;  No scleral icterus, Nares: Patent, Oropharynx: Clear, Edentulous, Neck:  FROM, no cervical lymphadenopathy nor thyromegaly or carotid bruit; no JVD; Breasts:: Not examined CHEST WALL: No tenderness CHEST: Normal respiration, clear to auscultation bilaterally HEART: Regular rate and rhythm; no murmurs rubs or gallops BACK: No kyphosis or scoliosis; no CVA tenderness ABDOMEN: Positive Bowel Sounds, soft non-tender; no masses, no organomegaly, no pannus; no intertriginous candida. Rectal Exam: Not done EXTREMITIES: No bone or joint deformity; age-appropriate arthropathy of the hands and knees; no cyanosis, clubbing or edema; no ulcerations. Genitalia: not examined PULSES:  2+ and symmetric SKIN: Normal hydration no rash or ulceration CNS: Cranial nerves 2-12 grossly intact no focal neurologic deficit   Labs on Admission:  Basic Metabolic Panel:  Lab 08/10/12 7829 08/10/12 2207  NA 143 140  K 3.0* 3.0*  CL 111 109  CO2 -- 18*  GLUCOSE 159* 166*  BUN 9 10  CREATININE 0.70 0.67  CALCIUM -- 8.5  MG -- --  PHOS -- --   Liver Function Tests:  Lab 08/10/12 2207  AST 21  ALT 14  ALKPHOS 213*  BILITOT 0.2*  PROT 6.3  ALBUMIN 2.8*    Lab 08/10/12 2207  LIPASE 5*  AMYLASE --   No results found for this basename: AMMONIA:5 in the last 168 hours CBC:  Lab 08/10/12 2222 08/10/12 2207  WBC -- 12.3*  NEUTROABS -- 4.3  HGB 11.2* 10.5*  HCT 33.0* 31.7*  MCV -- 68.3*  PLT -- 434*   Cardiac Enzymes: No results found for this basename: CKTOTAL:5,CKMB:5,CKMBINDEX:5,TROPONINI:5 in the last 168 hours BNP: No components found with this basename: POCBNP:5 CBG: No results found for this basename: GLUCAP:5 in the last 168 hours  Radiological Exams on Admission: No results found.  EKG: Independently reviewed. At 10:12 PM   +ST elevations in V2  and V3.     CODE STATUS:     FULL CODE      Time spent: 60 minutes  Ron Parker [facility] Pager [phone]  If 7PM-7AM, please contact night-coverage [email or web address] Password [email or web address] 08/11/2012, 1:51 AM

## 2012-08-12 LAB — BASIC METABOLIC PANEL
GFR calc Af Amer: 86 mL/min — ABNORMAL LOW (ref >90)
GFR calc non Af Amer: 74 mL/min — ABNORMAL LOW (ref >90)
Potassium: 3.8 mEq/L (ref 3.5–5.1)
Sodium: 137 mEq/L (ref 135–145)

## 2012-08-12 LAB — GLUCOSE, CAPILLARY: Glucose-Capillary: 164 mg/dL — ABNORMAL HIGH (ref 70–99)

## 2012-08-12 LAB — CBC
Hemoglobin: 11.9 g/dL — ABNORMAL LOW (ref 12.0–15.0)
RBC: 5.3 MIL/uL — ABNORMAL HIGH (ref 3.87–5.11)
WBC: 8.6 10*3/uL (ref 4.0–10.5)

## 2012-08-12 LAB — FOLATE: Folate: >20 ng/mL

## 2012-08-12 MED ORDER — GUAIFENESIN-DM 100-10 MG/5ML PO SYRP
5.0000 mL | ORAL_SOLUTION | ORAL | Status: DC | PRN
  Administered 2012-08-12: 5 mL via ORAL
  Filled 2012-08-12: qty 5

## 2012-08-12 NOTE — Progress Notes (Addendum)
[facility] PROGRESS NOTE  [place] F Vanloan NUU:[id number] DOB: 01-20-1946 DOA: 08/10/2012 PCP: Dorrene German, MD  Assessment/Plan: #1 DM2- continue SSI coverage, patient to Resume metformin after 48 hours from cath as directed -Her glucose control #2 HTN- metoprolol, per her primary team.  #3 CAD- Cardiac Cath done by Cards 08/10/2012, Stent placement X 1 in LAD . Placed on Plavix , ASA, by Cards.  #4 Anemia- hgb stable, follow anemia panel.  #5Hypokalemia- resolved, Magnesium level within normal limits  #6Tobacco Use Disorder- Tobacco cessation discussed 10/18, nicotine patch while hospitalized.   From medicine standpoint okay to DC in a.m. as per primary/Dr. Hazle Coca note today. She is to followup outpatient with her PCP. Blood sugars are controlled at this time, will sign off please call as needed  Code Status: Full code Family Communication: Directly with patient at bedside Disposition Plan: Per primary team  HPI/Subjective: She denies chest pain, no SOB. Also denies nausea or vomiting.  Objective: Filed Vitals:   08/12/12 0700 08/12/12 0800 08/12/12 1000 08/12/12 1100  BP: 137/90 116/87 116/79 105/73  Pulse:      Temp:  98.6 F (37 C)    TempSrc:  Oral    Resp:      Height:      Weight:      SpO2:  95%      Intake/Output Summary (Last 24 hours) at 08/12/12 1240 Last data filed at 08/12/12 1100  Gross per 24 hour  Intake    780 ml  Output   2600 ml  Net  -1820 ml   Filed Weights   08/10/12 2216 08/11/12 0045 08/11/12 0437  Weight: 73.5 kg (162 lb 0.6 oz) 73.9 kg (162 lb 14.7 oz) 72.8 kg (160 lb 7.9 oz)    Exam:   General: Elderly  Female, alert and oriented x3 no apparent distress  Cardiovascular: Regular rate and rhythm, normal S1-S2  Respiratory: Clear to auscultation bilaterally, no wheezes  Abdomen: Soft, bowel sounds present nontender nondistended no organomegaly and no masses palpable  Data Reviewed: Basic Metabolic Panel:  Lab  08/12/12 0526 08/11/12 0520 08/11/12 0047 08/10/12 2222 08/10/12 2207  NA 137 139 139 143 140  K 3.8 3.8 3.5 3.0* 3.0*  CL 106 107 105 111 109  CO2 21 22 21  -- 18*  GLUCOSE 116* 110* 122* 159* 166*  BUN 10 7 9 9 10   CREATININE 0.82 0.62 0.64 0.70 0.67  CALCIUM 9.1 8.6 9.0 -- 8.5  MG -- -- 1.8 -- --  PHOS -- -- -- -- --   Liver Function Tests:  Lab 08/11/12 0047 08/10/12 2207  AST 44* 21  ALT 19 14  ALKPHOS 252* 213*  BILITOT 0.2* 0.2*  PROT 7.3 6.3  ALBUMIN 3.2* 2.8*    Lab 08/10/12 2207  LIPASE 5*  AMYLASE --   No results found for this basename: AMMONIA:5 in the last 168 hours CBC:  Lab 08/12/12 0526 08/11/12 0520 08/11/12 0047 08/10/12 2222 08/10/12 2207  WBC 8.6 9.8 11.8* -- 12.3*  NEUTROABS -- -- 9.0* -- 4.3  HGB 11.9* 11.0* 11.8* 11.2* 10.5*  HCT 35.5* 33.2* 35.3* 33.0* 31.7*  MCV 67.0* 67.5* 68.3* -- 68.3*  PLT 408* 359 398 -- 434*   Cardiac Enzymes:  Lab 08/11/12 1835 08/11/12 1300 08/11/12 0520 08/11/12 0046  CKTOTAL -- -- -- --  CKMB -- -- -- --  CKMBINDEX -- -- -- --  TROPONINI 13.16* >20.00* >20.00* 3.27*   BNP (last 3 results)  Basename 08/11/12  1610 08/10/12 2207 12/02/11 2028  PROBNP 163.0* 139.0* 75.9   CBG:  Lab 08/12/12 1159 08/12/12 0755 08/11/12 2159 08/11/12 1634 08/11/12 1246  GLUCAP 92 172* 99 190* 135*    Recent Results (from the past 240 hour(s))  MRSA PCR SCREENING     Status: Normal   Collection Time   08/11/12  1:14 AM      Component Value Range Status Comment   MRSA by PCR NEGATIVE  NEGATIVE Final      Studies: Dg Chest Port 1 View  08/11/2012  *RADIOLOGY REPORT*  Clinical Data: Chest pain, shortness of breath  PORTABLE CHEST - 1 VIEW  Comparison: 12/02/2011  Findings: Lungs are essentially clear.  Mild bibasilar atelectasis. No pleural effusion or pneumothorax.  Cardiomediastinal silhouette is within normal limits.  IMPRESSION: No evidence of acute cardiopulmonary disease.   Original Report Authenticated By: Charline Bills, M.D.     Scheduled Meds:    . aspirin EC  81 mg Oral Daily  . atorvastatin  40 mg Oral q1800  . clonazePAM  1 mg Oral QHS  . clopidogrel  75 mg Oral Q breakfast  . clotrimazole  1 application Topical BID  . enoxaparin  40 mg Subcutaneous Daily  . insulin aspart  0-15 Units Subcutaneous TID WC  . lipase/protease/amylase  2 capsule Oral TID AC  . megestrol  400 mg Oral Daily  . metoprolol tartrate  12.5 mg Oral BID  . mirtazapine  15 mg Oral QHS  . pantoprazole  40 mg Oral Daily  . QUEtiapine  25 mg Oral QHS  . white petrolatum      . DISCONTD: insulin aspart  0-9 Units Subcutaneous Q4H   Continuous Infusions:    . sodium chloride 20 mL/hr at 08/10/12 2222    Principal Problem:  *STEMI (ST elevation myocardial infarction) Active Problems:  DIABETES MELLITUS II, UNCOMPLICATED  HYPERLIPIDEMIA  TOBACCO DEPENDENCE  HYPERTENSION, BENIGN SYSTEMIC  CAD- LAD DES 2007, patent 2008  ALCOHOL ABUSE, HX OF    Time spent:    Kela Millin  [facility] Pager [phone]. If 8PM-8AM, please contact night-coverage at [email or web address], password [facility] 08/12/2012, 12:40 PM  LOS: 2 days

## 2012-08-12 NOTE — Progress Notes (Signed)
Subjective:  No CP/SOB  Objective:  Temp:  [98.3 F (36.8 C)-99.2 F (37.3 C)] 99.1 F (37.3 C) (10/20 0311) Pulse Rate:  [67-125] 83  (10/19 2200) Resp:  [16-23] 20  (10/20 0400) BP: (103-152)/(60-99) 137/90 mmHg (10/20 0700) SpO2:  [93 %-98 %] 96 % (10/20 0311) Weight change:   Intake/Output from previous day: 10/19 0701 - 10/20 0700 In: 1020 [P.O.:1020] Out: 2150 [Urine:2150]  Intake/Output from this shift:    Physical Exam: General appearance: alert, cooperative and no distress Neck: no adenopathy, no carotid bruit, no JVD, supple, symmetrical, trachea midline and thyroid not enlarged, symmetric, no tenderness/mass/nodules Lungs: clear to auscultation bilaterally Heart: regular rate and rhythm, S1, S2 normal, no murmur, click, rub or gallop Extremities: extremities normal, atraumatic, no cyanosis or edema  Lab Results: Results for orders placed during the hospital encounter of 08/10/12 (from the past 48 hour(s))  CBC     Status: Abnormal   Collection Time   08/10/12 10:07 PM      Component Value Range Comment   WBC 12.3 (*) 4.0 - 10.5 K/uL    RBC 4.64  3.87 - 5.11 MIL/uL    Hemoglobin 10.5 (*) 12.0 - 15.0 g/dL    HCT 40.9 (*) 81.1 - 46.0 %    MCV 68.3 (*) 78.0 - 100.0 fL    MCH 22.6 (*) 26.0 - 34.0 pg    MCHC 33.1  30.0 - 36.0 g/dL    RDW 91.4 (*) 78.2 - 15.5 %    Platelets 434 (*) 150 - 400 K/uL   DIFFERENTIAL     Status: Abnormal   Collection Time   08/10/12 10:07 PM      Component Value Range Comment   Neutrophils Relative 35 (*) 43 - 77 %    Lymphocytes Relative 51 (*) 12 - 46 %    Monocytes Relative 9  3 - 12 %    Eosinophils Relative 4  0 - 5 %    Basophils Relative 1  0 - 1 %    Neutro Abs 4.3  1.7 - 7.7 K/uL    Lymphs Abs 6.3 (*) 0.7 - 4.0 K/uL    Monocytes Absolute 1.1 (*) 0.1 - 1.0 K/uL    Eosinophils Absolute 0.5  0.0 - 0.7 K/uL    Basophils Absolute 0.1  0.0 - 0.1 K/uL    Smear Review MORPHOLOGY UNREMARKABLE     PROTIME-INR     Status:  Abnormal   Collection Time   08/10/12 10:07 PM      Component Value Range Comment   Prothrombin Time 15.9 (*) 11.6 - 15.2 seconds    INR 1.30  0.00 - 1.49   APTT     Status: Normal   Collection Time   08/10/12 10:07 PM      Component Value Range Comment   aPTT 31  24 - 37 seconds   COMPREHENSIVE METABOLIC PANEL     Status: Abnormal   Collection Time   08/10/12 10:07 PM      Component Value Range Comment   Sodium 140  135 - 145 mEq/L    Potassium 3.0 (*) 3.5 - 5.1 mEq/L    Chloride 109  96 - 112 mEq/L    CO2 18 (*) 19 - 32 mEq/L    Glucose, Bld 166 (*) 70 - 99 mg/dL    BUN 10  6 - 23 mg/dL    Creatinine, Ser 9.56  0.50 - 1.10 mg/dL    Calcium 8.5  8.4 - 10.5 mg/dL    Total Protein 6.3  6.0 - 8.3 g/dL    Albumin 2.8 (*) 3.5 - 5.2 g/dL    AST 21  0 - 37 U/L    ALT 14  0 - 35 U/L    Alkaline Phosphatase 213 (*) 39 - 117 U/L    Total Bilirubin 0.2 (*) 0.3 - 1.2 mg/dL    GFR calc non Af Amer >90  >90 mL/min    GFR calc Af Amer >90  >90 mL/min   PRO B NATRIURETIC PEPTIDE     Status: Abnormal   Collection Time   08/10/12 10:07 PM      Component Value Range Comment   Pro B Natriuretic peptide (BNP) 139.0 (*) 0 - 125 pg/mL   LIPASE, BLOOD     Status: Abnormal   Collection Time   08/10/12 10:07 PM      Component Value Range Comment   Lipase 5 (*) 11 - 59 U/L   POCT I-STAT TROPONIN I     Status: Normal   Collection Time   08/10/12 10:20 PM      Component Value Range Comment   Troponin i, poc 0.00  0.00 - 0.08 ng/mL    Comment 3            POCT I-STAT, CHEM 8     Status: Abnormal   Collection Time   08/10/12 10:22 PM      Component Value Range Comment   Sodium 143  135 - 145 mEq/L    Potassium 3.0 (*) 3.5 - 5.1 mEq/L    Chloride 111  96 - 112 mEq/L    BUN 9  6 - 23 mg/dL    Creatinine, Ser 9.60  0.50 - 1.10 mg/dL    Glucose, Bld 454 (*) 70 - 99 mg/dL    Calcium, Ion 0.98 (*) 1.13 - 1.30 mmol/L    TCO2 17  0 - 100 mmol/L    Hemoglobin 11.2 (*) 12.0 - 15.0 g/dL    HCT  11.9 (*) 14.7 - 46.0 %   TROPONIN I     Status: Abnormal   Collection Time   08/11/12 12:46 AM      Component Value Range Comment   Troponin I 3.27 (*) <0.30 ng/mL   PRO B NATRIURETIC PEPTIDE     Status: Abnormal   Collection Time   08/11/12 12:46 AM      Component Value Range Comment   Pro B Natriuretic peptide (BNP) 163.0 (*) 0 - 125 pg/mL   PLATELET INHIBITION P2Y12     Status: Abnormal   Collection Time   08/11/12 12:47 AM      Component Value Range Comment   Platelet Function  P2Y12 179 (*) 194 - 418 PRU   CBC WITH DIFFERENTIAL     Status: Abnormal   Collection Time   08/11/12 12:47 AM      Component Value Range Comment   WBC 11.8 (*) 4.0 - 10.5 K/uL    RBC 5.17 (*) 3.87 - 5.11 MIL/uL    Hemoglobin 11.8 (*) 12.0 - 15.0 g/dL    HCT 82.9 (*) 56.2 - 46.0 %    MCV 68.3 (*) 78.0 - 100.0 fL    MCH 22.8 (*) 26.0 - 34.0 pg    MCHC 33.4  30.0 - 36.0 g/dL    RDW 13.0 (*) 86.5 - 15.5 %    Platelets 398  150 - 400 K/uL    Neutrophils  Relative 76  43 - 77 %    Lymphocytes Relative 17  12 - 46 %    Monocytes Relative 6  3 - 12 %    Eosinophils Relative 1  0 - 5 %    Basophils Relative 0  0 - 1 %    Neutro Abs 9.0 (*) 1.7 - 7.7 K/uL    Lymphs Abs 2.0  0.7 - 4.0 K/uL    Monocytes Absolute 0.7  0.1 - 1.0 K/uL    Eosinophils Absolute 0.1  0.0 - 0.7 K/uL    Basophils Absolute 0.0  0.0 - 0.1 K/uL    RBC Morphology ELLIPTOCYTES     TSH     Status: Normal   Collection Time   08/11/12 12:47 AM      Component Value Range Comment   TSH 1.471  0.350 - 4.500 uIU/mL   COMPREHENSIVE METABOLIC PANEL     Status: Abnormal   Collection Time   08/11/12 12:47 AM      Component Value Range Comment   Sodium 139  135 - 145 mEq/L    Potassium 3.5  3.5 - 5.1 mEq/L    Chloride 105  96 - 112 mEq/L    CO2 21  19 - 32 mEq/L    Glucose, Bld 122 (*) 70 - 99 mg/dL    BUN 9  6 - 23 mg/dL    Creatinine, Ser 1.61  0.50 - 1.10 mg/dL    Calcium 9.0  8.4 - 09.6 mg/dL    Total Protein 7.3  6.0 - 8.3 g/dL     Albumin 3.2 (*) 3.5 - 5.2 g/dL    AST 44 (*) 0 - 37 U/L    ALT 19  0 - 35 U/L    Alkaline Phosphatase 252 (*) 39 - 117 U/L    Total Bilirubin 0.2 (*) 0.3 - 1.2 mg/dL    GFR calc non Af Amer >90  >90 mL/min    GFR calc Af Amer >90  >90 mL/min   MAGNESIUM     Status: Normal   Collection Time   08/11/12 12:47 AM      Component Value Range Comment   Magnesium 1.8  1.5 - 2.5 mg/dL   HEMOGLOBIN E4V     Status: Abnormal   Collection Time   08/11/12 12:47 AM      Component Value Range Comment   Hemoglobin A1C 6.0 (*) <5.7 %    Mean Plasma Glucose 126 (*) <117 mg/dL   MRSA PCR SCREENING     Status: Normal   Collection Time   08/11/12  1:14 AM      Component Value Range Comment   MRSA by PCR NEGATIVE  NEGATIVE   GLUCOSE, CAPILLARY     Status: Abnormal   Collection Time   08/11/12  4:32 AM      Component Value Range Comment   Glucose-Capillary 106 (*) 70 - 99 mg/dL   TROPONIN I     Status: Abnormal   Collection Time   08/11/12  5:20 AM      Component Value Range Comment   Troponin I >20.00 (*) <0.30 ng/mL CRITICAL VALUE NOTED.  VALUE IS CONSISTENT WITH PREVIOUSLY REPORTED AND CALLED VALUE.  LIPID PANEL     Status: Abnormal   Collection Time   08/11/12  5:20 AM      Component Value Range Comment   Cholesterol 89  0 - 200 mg/dL    Triglycerides 48  <409 mg/dL  HDL 29 (*) >39 mg/dL    Total CHOL/HDL Ratio 3.1      VLDL 10  0 - 40 mg/dL    LDL Cholesterol 50  0 - 99 mg/dL   CBC     Status: Abnormal   Collection Time   08/11/12  5:20 AM      Component Value Range Comment   WBC 9.8  4.0 - 10.5 K/uL    RBC 4.92  3.87 - 5.11 MIL/uL    Hemoglobin 11.0 (*) 12.0 - 15.0 g/dL    HCT 24.4 (*) 01.0 - 46.0 %    MCV 67.5 (*) 78.0 - 100.0 fL    MCH 22.4 (*) 26.0 - 34.0 pg    MCHC 33.1  30.0 - 36.0 g/dL    RDW 27.2 (*) 53.6 - 15.5 %    Platelets 359  150 - 400 K/uL   BASIC METABOLIC PANEL     Status: Abnormal   Collection Time   08/11/12  5:20 AM      Component Value Range Comment    Sodium 139  135 - 145 mEq/L    Potassium 3.8  3.5 - 5.1 mEq/L    Chloride 107  96 - 112 mEq/L    CO2 22  19 - 32 mEq/L    Glucose, Bld 110 (*) 70 - 99 mg/dL    BUN 7  6 - 23 mg/dL    Creatinine, Ser 6.44  0.50 - 1.10 mg/dL    Calcium 8.6  8.4 - 03.4 mg/dL    GFR calc non Af Amer >90  >90 mL/min    GFR calc Af Amer >90  >90 mL/min   IRON AND TIBC     Status: Abnormal   Collection Time   08/11/12  5:20 AM      Component Value Range Comment   Iron 34 (*) 42 - 135 ug/dL    TIBC 742  595 - 638 ug/dL    Saturation Ratios 13 (*) 20 - 55 %    UIBC 238  125 - 400 ug/dL   RETICULOCYTES     Status: Normal   Collection Time   08/11/12  5:20 AM      Component Value Range Comment   Retic Ct Pct 1.1  0.4 - 3.1 %    RBC. 4.92  3.87 - 5.11 MIL/uL    Retic Count, Manual 54.1  19.0 - 186.0 K/uL   GLUCOSE, CAPILLARY     Status: Normal   Collection Time   08/11/12  7:44 AM      Component Value Range Comment   Glucose-Capillary 97  70 - 99 mg/dL   GLUCOSE, CAPILLARY     Status: Abnormal   Collection Time   08/11/12 12:46 PM      Component Value Range Comment   Glucose-Capillary 135 (*) 70 - 99 mg/dL   TROPONIN I     Status: Abnormal   Collection Time   08/11/12  1:00 PM      Component Value Range Comment   Troponin I >20.00 (*) <0.30 ng/mL   GLUCOSE, CAPILLARY     Status: Abnormal   Collection Time   08/11/12  4:34 PM      Component Value Range Comment   Glucose-Capillary 190 (*) 70 - 99 mg/dL   TROPONIN I     Status: Abnormal   Collection Time   08/11/12  6:35 PM      Component Value Range Comment   Troponin  I 13.16 (*) <0.30 ng/mL   GLUCOSE, CAPILLARY     Status: Normal   Collection Time   08/11/12  9:59 PM      Component Value Range Comment   Glucose-Capillary 99  70 - 99 mg/dL   BASIC METABOLIC PANEL     Status: Abnormal   Collection Time   08/12/12  5:26 AM      Component Value Range Comment   Sodium 137  135 - 145 mEq/L    Potassium 3.8  3.5 - 5.1 mEq/L    Chloride 106   96 - 112 mEq/L    CO2 21  19 - 32 mEq/L    Glucose, Bld 116 (*) 70 - 99 mg/dL    BUN 10  6 - 23 mg/dL    Creatinine, Ser 1.61  0.50 - 1.10 mg/dL    Calcium 9.1  8.4 - 09.6 mg/dL    GFR calc non Af Amer 74 (*) >90 mL/min    GFR calc Af Amer 86 (*) >90 mL/min   CBC     Status: Abnormal   Collection Time   08/12/12  5:26 AM      Component Value Range Comment   WBC 8.6  4.0 - 10.5 K/uL    RBC 5.30 (*) 3.87 - 5.11 MIL/uL    Hemoglobin 11.9 (*) 12.0 - 15.0 g/dL    HCT 04.5 (*) 40.9 - 46.0 %    MCV 67.0 (*) 78.0 - 100.0 fL    MCH 22.5 (*) 26.0 - 34.0 pg    MCHC 33.5  30.0 - 36.0 g/dL    RDW 81.1 (*) 91.4 - 15.5 %    Platelets 408 (*) 150 - 400 K/uL     Imaging: Imaging results have been reviewed  Assessment/Plan:   1. Principal Problem: 2.  *STEMI (ST elevation myocardial infarction) 3. Active Problems: 4.  DIABETES MELLITUS II, UNCOMPLICATED 5.  HYPERLIPIDEMIA 6.  TOBACCO DEPENDENCE 7.  HYPERTENSION, BENIGN SYSTEMIC 8.  CAD- LAD DES 2007, patent 2008 9.  ALCOHOL ABUSE, HX OF 10.   Time Spent Directly with Patient:  20 minutes  Length of Stay:  LOS: 2 days   POD # 2 STEMI/LAD intervention. No CP/SOB. Exam benign. Labs ok.On appropriate meds. OK to transfer to tele. [facility]. Prob home tomorrow.  [name] Foley 08/12/2012, 7:45 AM

## 2012-08-13 LAB — GLUCOSE, CAPILLARY

## 2012-08-13 LAB — BASIC METABOLIC PANEL
BUN: 18 mg/dL (ref 6–23)
Calcium: 9 mg/dL (ref 8.4–10.5)
GFR calc Af Amer: 82 mL/min — ABNORMAL LOW (ref >90)
GFR calc non Af Amer: 71 mL/min — ABNORMAL LOW (ref >90)
Potassium: 3.9 mEq/L (ref 3.5–5.1)
Sodium: 140 mEq/L (ref 135–145)

## 2012-08-13 MED ORDER — METOPROLOL TARTRATE 25 MG PO TABS: 12.5000 mg | ORAL_TABLET | Freq: Two times a day (BID) | ORAL | Status: DC

## 2012-08-13 MED ORDER — NITROGLYCERIN 0.4 MG SL SUBL: 0.4000 mg | SUBLINGUAL_TABLET | SUBLINGUAL | Status: DC | PRN

## 2012-08-13 MED ORDER — METOCLOPRAMIDE HCL 10 MG PO TABS: 10.0000 mg | ORAL_TABLET | Freq: Four times a day (QID) | ORAL | Status: DC | PRN

## 2012-08-13 MED ORDER — ASPIRIN 81 MG PO TBEC: 81.0000 mg | DELAYED_RELEASE_TABLET | Freq: Every day | ORAL | Status: DC

## 2012-08-13 MED ORDER — ATORVASTATIN CALCIUM 40 MG PO TABS: 40.0000 mg | ORAL_TABLET | Freq: Every day | ORAL | Status: DC

## 2012-08-13 MED FILL — Dextrose Inj 5%: INTRAVENOUS | Qty: 50 | Status: AC

## 2012-08-13 NOTE — Progress Notes (Signed)
Subjective: No complaints, walked without problems  Objective: Vital signs in last 24 hours: Temp:  [97.9 F (36.6 C)-98.7 F (37.1 C)] 98.5 F (36.9 C) (10/21 0500) Pulse Rate:  [77-98] 77  (10/21 0500) Resp:  [18-20] 18  (10/21 0500) BP: (91-116)/(54-80) 92/61 mmHg (10/21 0500) SpO2:  [97 %-98 %] 97 % (10/21 0500) Weight:  [74.844 kg (165 lb)] 74.844 kg (165 lb) (10/21 0500) Weight change:    Intake/Output from previous day: -30 10/20 0701 - 10/21 0700 In: 420 [P.O.:420] Out: 450 [Urine:450] Intake/Output this shift:    PE: General:alert and oriented, no problems Heart:S1S2 RRR Lungs:clear without rales rhonchi or wheezes Abd:+ BS, soft, non tender Ext:no edema    Lab Results:  Basename 08/12/12 0526 08/11/12 0520  WBC 8.6 9.8  HGB 11.9* 11.0*  HCT 35.5* 33.2*  PLT 408* 359   BMET  Basename 08/13/12 0505 08/12/12 0526  NA 140 137  K 3.9 3.8  CL 108 106  CO2 21 21  GLUCOSE 109* 116*  BUN 18 10  CREATININE 0.85 0.82  CALCIUM 9.0 9.1    Basename 08/11/12 1835 08/11/12 1300  TROPONINI 13.16* >20.00*    Lab Results  Component Value Date   CHOL 89 08/11/2012   HDL 29* 08/11/2012   LDLCALC 50 08/11/2012   TRIG 48 08/11/2012   CHOLHDL 3.1 08/11/2012   Lab Results  Component Value Date   HGBA1C 6.0* 08/11/2012     Lab Results  Component Value Date   TSH 1.471 08/11/2012    Hepatic Function Panel  Basename 08/11/12 0047  PROT 7.3  ALBUMIN 3.2*  AST 44*  ALT 19  ALKPHOS 252*  BILITOT 0.2*  BILIDIR --  IBILI --    Basename 08/11/12 0520  CHOL 89   No results found for this basename: PROTIME in the last 72 hours    EKG: Orders placed during the hospital encounter of 08/10/12  . ED EKG  . ED EKG  . EKG 12-LEAD  . EKG 12-LEAD  . EKG 12-LEAD  . EKG 12-LEAD  . EKG 12-LEAD  . EKG 12-LEAD  . EKG 12-LEAD  . EKG 12-LEAD  . EKG 12-LEAD  . EKG 12-LEAD  . EKG 12-LEAD    Studies/Results: Cardiac cath emergent: Final Conclusions:    1. Single vessel occlusive coronary disease. Patient had occlusion of the mid LAD at the site of a previous DES.  2. Well-preserved left ventricular systolic function.  3. Successful intracoronary stenting of the mid LAD with a drug-eluting stent.     Medications: I have reviewed the patient's current medications.    Marland Kitchen aspirin EC  81 mg Oral Daily  . atorvastatin  40 mg Oral q1800  . clonazePAM  1 mg Oral QHS  . clopidogrel  75 mg Oral Q breakfast  . clotrimazole  1 application Topical BID  . enoxaparin  40 mg Subcutaneous Daily  . insulin aspart  0-15 Units Subcutaneous TID WC  . lipase/protease/amylase  2 capsule Oral TID AC  . megestrol  400 mg Oral Daily  . metoprolol tartrate  12.5 mg Oral BID  . mirtazapine  15 mg Oral QHS  . pantoprazole  40 mg Oral Daily  . QUEtiapine  25 mg Oral QHS   Assessment/Plan: Principal Problem:  *STEMI (ST elevation myocardial infarction), with PCI with DES to mid LAD at site of previous stent Active Problems:  DIABETES MELLITUS II, UNCOMPLICATED  HYPERLIPIDEMIA  TOBACCO DEPENDENCE  HYPERTENSION, BENIGN SYSTEMIC  CAD- LAD  DES 2007, patent 2008  ALCOHOL ABUSE, HX OF  PLAN:  EF 55% at cath.  EKG with evolutionary changes of acute MI, will recheck today for baseline.  Pk troponin >20 now declining.  On ASA/plavix, BB, statin BP borderline Ambulated with cardiac rehab  Pt lives alone.     LOS: 3 days   INGOLD,LAURA R 08/13/2012, 9:29 AM   I have seen and examined the patient along with Nada Boozer, NP.  I have reviewed the chart, notes and new data.  I agree with NP's note.   Feels well. No angina, heart failure or arrhythmia. No groin complications. DC home. Reinforced mandatory dual antiplatelet therapy.  Thurmon Fair, MD, [facility] [phone] 08/13/2012, 10:29 AM

## 2012-08-13 NOTE — Progress Notes (Signed)
CARDIAC REHAB PHASE I   PRE:  Rate/Rhythm: 93SR  BP:  Supine:   Sitting: 97/70  Standing:    SaO2: 99%RA  MODE:  Ambulation: 550 ft   POST:  Rate/Rhythem: 93  BP:  Supine:   Sitting: 104/73  Standing:    SaO2: 100%RA 0750-0850 Pt walked 550 ft on RA with steady gait. Tolerated well. Denied CP. Education completed. Gave pt stent card. Discussed smoking cessation and gave pt fake cigarette and handouts. Encouraged her to call 1-800-QUITNOW if any questions. Quit once for 3 months. Declined CRP 2 due to no transportation. To recliner after walk with call bell.  Duanne Limerick

## 2012-08-13 NOTE — Progress Notes (Signed)
Reviewed discharge instructions with patient and she stated her understanding.  Reinforced smoking cessation with patient.  Patient discharged via wheelchair by volunteers.  [name] Foley

## 2012-08-14 NOTE — Discharge Summary (Signed)
Physician Discharge Summary  Patient ID: [name] Foley MRN: [id number] DOB/AGE: 1946-01-13 66 y.o.  Admit date: 08/10/2012 Discharge date: 08/13/2012  Discharge Diagnoses:  Principal Problem:  *STEMI (ST elevation myocardial infarction), with PCI with DES to mid LAD at site of previous stent Active Problems:  DIABETES MELLITUS II, UNCOMPLICATED  HYPERLIPIDEMIA  TOBACCO DEPENDENCE  HYPERTENSION, BENIGN SYSTEMIC  CAD- LAD DES 2007, patent 2008  ALCOHOL ABUSE, HX OF  Procedure: 08/11/2012 urgent cardiac catheterization by Dr. Peter [place]and 08/11/2012 Successful intracoronary stenting of the mid LAD with a drug-eluting stent By Dr. Peter [place]   Discharged Condition: good  Hospital Course: [name] Foley is a 66 year old African female history of coronary artery disease status post Taxus stent implantation remotely. She has had stent occlusion 2006. She is a history of chronic pancreatitis, alcohol use, tobacco abuse. She developed chest pain and a cough the evening of 10/18-10/19/2013  and was brought to [facility] ER where an EKG revealed anterolateral ST segment elevation. She was brought to the Cath Lab by Dr. Peter [place]and for urgent intervention.   She had successful intracoronary stenting of the mid LAD with a drug-eluting stent for single vessel occlusive coronary disease. Patient had occlusion of the mid LAD at the site of a previous DES, by Dr. Peter [place].  She was then admitted to the CCU.  Later in the day of October 19 patient was stable without further complaints.  Her troponin peaked at >20. She was up in the chair and and and related and did quite well without any acute problems.  [facility] followed her for her diabetes during the hospitalization.  Patient had continued to smoke despite coronary artery disease she was again counseled by physicians and cardiac rehabilitation to stop smoking.  By October 21 she was stable and medically for discharge home seemed related  without complications she was seen and evaluated by Dr. Royann Shivers who agreed with discharge.    Consults: cardiology  Significant Diagnostic Studies:  BMET    Component Value Date/Time   NA 140 08/13/2012 0505   K 3.9 08/13/2012 0505   CL 108 08/13/2012 0505   CO2 21 08/13/2012 0505   GLUCOSE 109* 08/13/2012 0505   BUN 18 08/13/2012 0505   CREATININE 0.85 08/13/2012 0505   CALCIUM 9.0 08/13/2012 0505   GFRNONAA 71* 08/13/2012 0505   GFRAA 82* 08/13/2012 0505    CBC    Component Value Date/Time   WBC 8.6 08/12/2012 0526   RBC 5.30* 08/12/2012 0526   HGB 11.9* 08/12/2012 0526   HCT 35.5* 08/12/2012 0526   PLT 408* 08/12/2012 0526   MCV 67.0* 08/12/2012 0526   MCH 22.5* 08/12/2012 0526   MCHC 33.5 08/12/2012 0526   RDW 18.2* 08/12/2012 0526   LYMPHSABS 2.0 08/11/2012 0047   MONOABS 0.7 08/11/2012 0047   EOSABS 0.1 08/11/2012 0047   BASOSABS 0.0 08/11/2012 0047   Cardiac enzymes peak troponin >20, before discharge it had decreased to 13.16.    ProBNP 163  Total cholesterol 89 triglycerides 48 HDL 29 LDL 50 Iron 34 UIBC 238 TIBC 272 saturation ratio is 13 ferritin 81 folate greater than 20  B12 363 Platelet function P2Y12  179 Hemoglobin A1c 6.0 TSH 1.471  EKG on admission with ST elevation of 2 mm in V2 and minimally in V3 2 days prior to discharge EKG with evolutionary changes of an anterior MI with deep T-wave inversions in leads V1 through V4  PORTABLE CHEST - 1 VIEW  Comparison: 12/02/2011  Findings: Lungs are essentially clear. Mild bibasilar atelectasis.  No pleural effusion or pneumothorax.  Cardiomediastinal silhouette is within normal limits.  IMPRESSION:  No evidence of acute cardiopulmonary disease.   Discharge Exam: Blood pressure 105/71, pulse 93, temperature 98.5 F (36.9 C), temperature source Oral, resp. rate 18, height 5\' 2"  (1.575 m), weight 74.844 kg (165 lb), SpO2 97.00%.   PE: General:alert and oriented, no problems  Heart:S1S2 RRR    Lungs:clear without rales rhonchi or wheezes  Abd:+ BS, soft, non tender  Ext:no edema  Disposition: 01-Home or Self Care  Discharge Orders    Future Appointments: Provider: Department: Dept Phone: Center:   09/07/2012 9:45 AM Iva Boop, MD [facility] Office [phone] [facility]       Medication List     As of 08/14/2012  3:08 PM    TAKE these medications         aspirin 81 MG EC tablet   Take 1 tablet (81 mg total) by mouth daily.      atorvastatin 40 MG tablet   Commonly known as: LIPITOR   Take 1 tablet (40 mg total) by mouth daily at 6 PM.      clonazePAM 1 MG tablet   Commonly known as: KLONOPIN   Take 1 tablet (1 mg total) by mouth at bedtime.      clopidogrel 75 MG tablet   Commonly known as: PLAVIX   Take 75 mg by mouth daily.      clotrimazole 1 % cream   Commonly known as: LOTRIMIN   Apply 1 application topically 2 (two) times daily. Apply to affected area 2 times daily      megestrol 40 MG/ML suspension   Commonly known as: MEGACE   Take 400 mg by mouth daily.      metFORMIN 500 MG tablet   Commonly known as: GLUCOPHAGE   Take 500 mg by mouth daily.      metoCLOPramide 10 MG tablet   Commonly known as: REGLAN   Take 1 tablet (10 mg total) by mouth every 6 (six) hours as needed (nausea).      metoprolol tartrate 25 MG tablet   Commonly known as: LOPRESSOR   Take 0.5 tablets (12.5 mg total) by mouth 2 (two) times daily.      mirtazapine 15 MG tablet   Commonly known as: REMERON   Take 1 tablet (15 mg total) by mouth at bedtime.      nitroGLYCERIN 0.4 MG SL tablet   Commonly known as: NITROSTAT   Place 1 tablet (0.4 mg total) under the tongue every 5 (five) minutes x 3 doses as needed for chest pain.      omeprazole 40 MG capsule   Commonly known as: PRILOSEC   Take 1 capsule (40 mg total) by mouth daily. 30 minutes before breakfast      oxyCODONE 5 MG immediate release tablet   Commonly known as: Oxy IR/ROXICODONE   Take 2  tablets (10 mg total) by mouth every 8 (eight) hours as needed for pain (may take 2 tablets).      Pancrelipase (Lip-Prot-Amyl) 24000 UNITS Cpep   Take 3 capsules by mouth 3 (three) times daily. 3 capsules with meals and 1 with snacks      QUEtiapine 25 MG tablet   Commonly known as: SEROQUEL   Take 25 mg by mouth at bedtime.           Follow-up Information    Follow up with [facility]  J, MD. On 08/29/2012. (at 2:00  PM)    Contact information:   [address] [place] [address] [phone]        Discharge instructions: Heart Healthy Diabetic diet  Take 1 NTG, under your tongue, while sitting.  If no relief of pain may repeat NTG, one tab every 5 minutes up to 3 tablets total over 15 minutes.  If no relief CALL 911.  If you have dizziness/lightheadness  while taking NTG, stop taking and call 911.        Call [facility] if any bleeding, swelling or drainage at cath site.  May shower, no tub baths for 48 hours for groin sticks.   Call the office any problems  No strenuous activity for 2 weeks, no lifting over 8 pounds.  No smoking    Signed: Khristian Phillippi R 08/14/2012, 3:08 PM

## 2012-08-27 ENCOUNTER — Ambulatory Visit: Payer: Medicaid Other | Admitting: Internal Medicine

## 2012-09-03 ENCOUNTER — Ambulatory Visit (INDEPENDENT_AMBULATORY_CARE_PROVIDER_SITE_OTHER): Payer: Self-pay | Admitting: Surgery

## 2012-09-05 ENCOUNTER — Ambulatory Visit (INDEPENDENT_AMBULATORY_CARE_PROVIDER_SITE_OTHER): Payer: Self-pay | Admitting: Surgery

## 2012-09-07 ENCOUNTER — Encounter: Payer: Self-pay | Admitting: Internal Medicine

## 2012-09-07 ENCOUNTER — Other Ambulatory Visit (INDEPENDENT_AMBULATORY_CARE_PROVIDER_SITE_OTHER): Payer: Medicaid Other

## 2012-09-07 ENCOUNTER — Ambulatory Visit (INDEPENDENT_AMBULATORY_CARE_PROVIDER_SITE_OTHER): Payer: Medicaid Other | Admitting: Internal Medicine

## 2012-09-07 VITALS — BP 100/66 | HR 76 | Ht 62.0 in | Wt 172.5 lb

## 2012-09-07 DIAGNOSIS — K861 Other chronic pancreatitis: Secondary | ICD-10-CM

## 2012-09-07 DIAGNOSIS — R10816 Epigastric abdominal tenderness: Secondary | ICD-10-CM

## 2012-09-07 DIAGNOSIS — R1013 Epigastric pain: Secondary | ICD-10-CM

## 2012-09-07 DIAGNOSIS — K831 Obstruction of bile duct: Secondary | ICD-10-CM

## 2012-09-07 LAB — HEPATIC FUNCTION PANEL
Alkaline Phosphatase: 173 U/L — ABNORMAL HIGH (ref 39–117)
Bilirubin, Direct: 0.1 mg/dL (ref 0.0–0.3)
Total Bilirubin: 0.4 mg/dL (ref 0.3–1.2)
Total Protein: 7.1 g/dL (ref 6.0–8.3)

## 2012-09-07 LAB — AMYLASE: Amylase: 79 U/L (ref 27–131)

## 2012-09-07 MED ORDER — OXYCODONE HCL 5 MG PO TABS: 10.0000 mg | ORAL_TABLET | Freq: Three times a day (TID) | ORAL | Status: DC | PRN

## 2012-09-07 NOTE — Patient Instructions (Addendum)
You have been given a separate informational sheet regarding your tobacco use, the importance of quitting and local resources to help you quit.  Your physician has requested that you go to the basement for the following lab work before leaving today: Amylase, Lipase, LFT's  You have been given a written rx for Oxycodone to take to the pharmacy.  You have been scheduled for a CT scan of the abdomen  at [facility] CT ([address]---this is in the same building as Architectural technologist).   You are scheduled on Nov. 21 at 10:30am. You should arrive 15 minutes prior to your appointment time for registration. Please follow the written instructions below on the day of your exam:  WARNING: IF YOU ARE ALLERGIC TO IODINE/X-RAY DYE, PLEASE NOTIFY RADIOLOGY IMMEDIATELY AT [phone]! YOU WILL BE GIVEN A 13 HOUR PREMEDICATION PREP.  1) Do not eat or drink anything after 6:30am (4 hours prior to your test) 2) You have been given 2 bottles of oral contrast to drink. The solution may taste  better if refrigerated, but do NOT add ice or any other liquid to this solution. Shake well before drinking.    Drink 1 bottle of contrast @ 8:30am (2 hours prior to your exam)  Drink 1 bottle of contrast @ 9:30am (1 hour prior to your exam)  You may take any medications as prescribed with a small amount of water except for the following: Metformin, Glucophage, Glucovance, Avandamet, Riomet, Fortamet, Actoplus Met, Janumet, Glumetza or Metaglip. The above medications must be held the day of the exam AND 48 hours after the exam.  The purpose of you drinking the oral contrast is to aid in the visualization of your intestinal tract. The contrast solution may cause some diarrhea. Before your exam is started, you will be given a small amount of fluid to drink. Depending on your individual set of symptoms, you may also receive an intravenous injection of x-ray contrast/dye. Plan on being at [facility] for 30  minutes or long, depending on the type of exam you are having performed.  This test typically takes 30-45 minutes to complete.  If you have any questions regarding your exam or if you need to reschedule, you may call the CT department at [phone] between the hours of 8:00 am and 5:00 pm, Monday-Friday.  Thank you for choosing me and [facility].  Iva Boop, M.D., [facility]  ________________________________________________________________________

## 2012-09-07 NOTE — Progress Notes (Signed)
Subjective:    Patient ID: [name] Foley, female    DOB: 11-01-1945, 66 y.o.   MRN: [id number]  HPI She is being seen in follow-up after she had stenting of a very distal CBD stenosis at [facility] 07/25/2012. to see if it would help her abdominal pain. It may have helped some - she is still having epigastric tenderness and pain. Ran out of oxycodone last week and wants a refill. She has regained some weight. No steatorrhea complaints.  Allergies  Allergen Reactions  . Diclofenac Sodium     REACTION: Chronic renal insufficiency.  . Other Other (See Comments)    Blackeyed peas and Pinto Beans...break out in hives   Outpatient Prescriptions Prior to Visit  Medication Sig Dispense Refill  . aspirin EC 81 MG EC tablet Take 1 tablet (81 mg total) by mouth daily.      Marland Kitchen atorvastatin (LIPITOR) 40 MG tablet Take 1 tablet (40 mg total) by mouth daily at 6 PM.  30 tablet  11  . clonazePAM (KLONOPIN) 1 MG tablet Take 1 tablet (1 mg total) by mouth at bedtime.  30 tablet  0  . clopidogrel (PLAVIX) 75 MG tablet Take 75 mg by mouth daily.        . clotrimazole (LOTRIMIN) 1 % cream Apply 1 application topically 2 (two) times daily. Apply to affected area 2 times daily      . megestrol (MEGACE) 40 MG/ML suspension Take 400 mg by mouth daily.      . metFORMIN (GLUCOPHAGE) 500 MG tablet Take 500 mg by mouth daily.        . metoCLOPramide (REGLAN) 10 MG tablet Take 1 tablet (10 mg total) by mouth every 6 (six) hours as needed (nausea).  20 tablet  0  . metoprolol tartrate (LOPRESSOR) 25 MG tablet Take 0.5 tablets (12.5 mg total) by mouth 2 (two) times daily.  30 tablet  11  . mirtazapine (REMERON) 15 MG tablet Take 1 tablet (15 mg total) by mouth at bedtime.  30 tablet  0  . nitroGLYCERIN (NITROSTAT) 0.4 MG SL tablet Place 1 tablet (0.4 mg total) under the tongue every 5 (five) minutes x 3 doses as needed for chest pain.  25 tablet  4  . omeprazole (PRILOSEC) 40 MG capsule Take 1 capsule (40 mg total) by mouth  daily. 30 minutes before breakfast  30 capsule  11  . Pancrelipase, Lip-Prot-Amyl, 24000 UNITS CPEP Take 3 capsules by mouth 3 (three) times daily. 3 capsules with meals and 1 with snacks      . QUEtiapine (SEROQUEL) 25 MG tablet Take 25 mg by mouth at bedtime.      . [DISCONTINUED] oxyCODONE (OXY IR/ROXICODONE) 5 MG immediate release tablet Take 2 tablets (10 mg total) by mouth every 8 (eight) hours as needed for pain (may take 2 tablets).  60 tablet  0   Last reviewed on 09/07/2012 10:08 AM by Iva Boop, MD Past Medical History  Diagnosis Date  . Diabetes mellitus   . 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 Coronary stent occlusion   . GERD (gastroesophageal reflux disease)   . Hypertension     no medication currently B/P has been ok  . Pneumonia     12/02/11 pneumonia  . Stented coronary artery   . Tobacco dependence   . Alcohol abuse     abstinent since 2009  . Insomnia   . Psoriasis   . Hemorrhoids   .  Colon adenoma 04/09/2012    diminutive cecal  . Coronary artery disease   . HLD (hyperlipidemia)   . History of MI (myocardial infarction)   . Hiatal hernia   . Common bile duct stricture    Past Surgical History  Procedure Date  . Cholecystectomy   . Abdominal hysterectomy   . Cornary stent placement   . Eus 01/05/2012    Procedure: UPPER ENDOSCOPIC ULTRASOUND (EUS) LINEAR;  Surgeon: Rachael Fee, MD;  Location: [facility];  Service: Endoscopy;  Laterality: N/A;  radial linear  . Colonoscopy     multiple  . Ercp 07/25/2012    Procedure: ENDOSCOPIC RETROGRADE CHOLANGIOPANCREATOGRAPHY (ER[facility]);  Surgeon: Iva Boop, MD;  Location: [facility];  Service: Endoscopy;  Laterality: N/A;  with stent   History   Social History  . Marital Status: Single                 Occupational History  . RETIRED    Social History Main Topics  . Smoking status: Current Every Day Smoker -- 0.5 packs/day for 15 years  . Smokeless tobacco: Never  Used     Comment: TRYING TO CUT BACK.  Marland Kitchen Alcohol Use: No     Comment: past h/o alcohol abuse quit 4/09  . Drug Use: No  . Sexually Active: No     Family History  Problem Relation Age of Onset  . Malignant hyperthermia Neg Hx   . Colon cancer Mother     colon resection  . Coronary artery disease Brother   . Renal Disease Brother   . Cirrhosis Father   . Diabetes Mother       Review of Systems Had STEMI treated with coronary stent in October, later part of month.    Objective:   Physical Exam General:  NAD Eyes:   anicteric Lungs:  clear Heart:  S1S2 no rubs, murmurs or gallops Abdomen:  soft tender epigastrium with fullness there, BS+ Ext:   no edema    Data Reviewed:  [place] hospital notes Last CT [facility] labs         Assessment & Plan:   1. Chronic pancreatitis  oxyCODONE (OXY IR/ROXICODONE) 5 MG immediate release tablet, Amylase, Lipase, Hepatic function panel, CT Abdomen W Contrast  2. Epigastric abdominal tenderness  Amylase, Lipase, Hepatic function panel  3. Epigastric pain  oxyCODONE (OXY IR/ROXICODONE) 5 MG immediate release tablet, Amylase, Lipase, Hepatic function panel, CT Abdomen W Contrast  4. Common bile duct (CBD) stricture  Amylase, Lipase, Hepatic function panel   Refill oxycodone (ran out 1 week ago)Await labs and CT results I think she will need stent pull by January - not clear it has helped She would like to wait until after holidays to pull stent if she can. I reviewed signs of stent occlusion and to call immediately or go to ED if occur.   WU:[id number] A, MD

## 2012-09-13 ENCOUNTER — Telehealth: Payer: Self-pay

## 2012-09-13 ENCOUNTER — Other Ambulatory Visit: Payer: Medicaid Other

## 2012-09-13 NOTE — Telephone Encounter (Signed)
Message copied by [place], Kanchan Gal E on Thu Sep 13, 2012  5:13 PM ------      Message from: Stan Head E      Created: Thu Sep 13, 2012  4:56 PM       Change the CT to [place] of abdomen - epigastric pain, chronic pancreatitis, biliary dilation      ----- Message -----         From: Danaysia Rader E [place], CMA         Sent: 09/12/2012   5:05 PM           To: Iva Boop, MD            Spoke to [facility] CT and we have contacted patient that CT has been denied until a peer to peer can be done.  She is R/S'ed for 09-17-12 at 11:00am.  The phone # is [phone], her ID# is [id number], CPT code [place] 74[address].  Thank you and let me know if you need more info.

## 2012-09-13 NOTE — Telephone Encounter (Signed)
Canceled CT appointment for 09/17/12.  Will contact pt tomorrow and set up U/S.

## 2012-09-14 ENCOUNTER — Other Ambulatory Visit: Payer: Self-pay

## 2012-09-14 DIAGNOSIS — K838 Other specified diseases of biliary tract: Secondary | ICD-10-CM

## 2012-09-14 DIAGNOSIS — K861 Other chronic pancreatitis: Secondary | ICD-10-CM

## 2012-09-14 DIAGNOSIS — R1013 Epigastric pain: Secondary | ICD-10-CM

## 2012-09-14 NOTE — Telephone Encounter (Signed)
Pt informed of date/time of U/S at [facility] 09/19/12 at 9AM, verbalized understanding.

## 2012-09-14 NOTE — Progress Notes (Signed)
Patient informed of date, time for U/S at [facility] 09/19/12 at 9am.  Verbalized understanding.

## 2012-09-17 ENCOUNTER — Other Ambulatory Visit: Payer: Medicaid Other

## 2012-09-19 ENCOUNTER — Ambulatory Visit (HOSPITAL_COMMUNITY): Payer: Medicaid Other

## 2012-09-19 ENCOUNTER — Telehealth: Payer: Self-pay | Admitting: Internal Medicine

## 2012-09-19 NOTE — Telephone Encounter (Signed)
Patient has a cold and not able to go for [place] today.  She is rescheduled for 09/28/12

## 2012-09-24 ENCOUNTER — Encounter (INDEPENDENT_AMBULATORY_CARE_PROVIDER_SITE_OTHER): Payer: Self-pay | Admitting: Surgery

## 2012-09-24 ENCOUNTER — Ambulatory Visit (INDEPENDENT_AMBULATORY_CARE_PROVIDER_SITE_OTHER): Payer: Medicaid Other | Admitting: Surgery

## 2012-09-24 VITALS — BP 122/80 | HR 100 | Resp 25 | Ht 62.0 in | Wt 174.0 lb

## 2012-09-24 DIAGNOSIS — K648 Other hemorrhoids: Secondary | ICD-10-CM

## 2012-09-24 DIAGNOSIS — K429 Umbilical hernia without obstruction or gangrene: Secondary | ICD-10-CM

## 2012-09-24 DIAGNOSIS — L290 Pruritus ani: Secondary | ICD-10-CM

## 2012-09-24 DIAGNOSIS — R197 Diarrhea, unspecified: Secondary | ICD-10-CM

## 2012-09-24 NOTE — Progress Notes (Signed)
Subjective:     Patient ID: [name] Foley, female   DOB: 12/09/1945, 66 y.o.   MRN: [id number]  HPI  [name] Terrio  Aug 17, 1946 [id number]  Patient Care Team: Dorrene German, MD as PCP - General (Internal Medicine) Iva Boop, MD as Consulting Physician (Gastroenterology) Runell Gess, MD as Consulting Physician (Cardiology)  This patient is a 66 y.o.female who presents today for surgical evaluation at the request of Dr. Concepcion Elk.   Reason for evaluation: Rectal pain.  Hemorrhoids.  Hopefully eliminate with numerous health issues.  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  History of diarrhea.  Under better control now.  Bowel movements three times a day.  Also history of coronary artery disease.  Had a recurrent heart attack.  Needed a new CAD drug eluting stent placed in October.  She is on chronic Plavix.  The patient has been struggling with rectal pain for a year.  Preparation H. Can help with the burning.  Sometimes feels a lump popping out.  Very painful to have a bowel movement.  No major bleeding.  No fevers or chills.  She has never had any treatments or banding or injection done.  No hemorrhoid surgery done.  Had an open cholecystectomy in 1976.  Claims she can walk up to 40 minutes without problems.  Patient Active Problem List  Diagnosis  . DIABETES MELLITUS II, UNCOMPLICATED  . HYPERLIPIDEMIA  . TOBACCO DEPENDENCE  . HYPERTENSION, BENIGN SYSTEMIC  . CAD- LAD DES 2007, patent 2008  . PANCREATITIS, CHRONIC   . PSORIASIS  . INSOMNIA NOS  . ALCOHOL ABUSE, HX OF  . Common bile duct (CBD) stricture  . CAP (community acquired pneumonia)  . Nasal congestion  . Cough  . Personal history of colonic polyp-adenoma  . Chronic diarrhea - ? steatorrhea vs. IBS or both  . Periumbilical hernia  . STEMI (ST elevation myocardial infarction), with PCI with DES to mid LAD at site of previous stent  . Pruritus ani  .  Internal hemorrhoids with pain & intermittent prolapse  . Diarrhea, possible steatodiarrhea    Past Medical History  Diagnosis Date  . Diabetes mellitus   . 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 Coronary stent occlusion   . GERD (gastroesophageal reflux disease)   . Hypertension     no medication currently B/P has been ok  . Pneumonia     12/02/11 pneumonia  . Stented coronary artery   . Tobacco dependence   . Alcohol abuse     abstinent since 2009  . Insomnia   . Psoriasis   . Hemorrhoids   . Colon adenoma 04/09/2012    diminutive cecal  . Coronary artery disease   . HLD (hyperlipidemia)   . History of MI (myocardial infarction)   . Hiatal hernia   . Common bile duct stricture   . GERD (gastroesophageal reflux disease)     Past Surgical History  Procedure Date  . Cholecystectomy 1976  . Abdominal hysterectomy 1994  . Carotid stent insertion 2006/2013  . Eus 01/05/2012    Procedure: UPPER ENDOSCOPIC ULTRASOUND (EUS) LINEAR;  Surgeon: Rachael Fee, MD;  Location: [facility];  Service: Endoscopy;  Laterality: N/A;  radial linear  . Colonoscopy     multiple  . Ercp 07/25/2012    Procedure: ENDOSCOPIC RETROGRADE CHOLANGIOPANCREATOGRAPHY (ERCP);  Surgeon: Iva Boop, MD;  Location: [facility];  Service:  Endoscopy;  Laterality: N/A;  with stent    History   Social History  . Marital Status: Single    Spouse Name: N/A    Number of Children: N/A  . Years of Education: N/A   Occupational History  . RETIRED    Social History Main Topics  . Smoking status: Current Every Day Smoker -- 0.5 packs/day for 15 years  . Smokeless tobacco: Never Used     Comment: TRYING TO CUT BACK.  Marland Kitchen Alcohol Use: No     Comment: past h/o alcohol abuse quit 4/09  . Drug Use: No  . Sexually Active: No   Other Topics Concern  . Not on file   Social History Narrative  . No narrative on file    Family History  Problem Relation Age of Onset  .  Malignant hyperthermia Neg Hx   . Colon cancer Mother     colon resection  . Coronary artery disease Brother   . Renal Disease Brother   . Cirrhosis Father   . Diabetes Mother     Current Outpatient Prescriptions  Medication Sig Dispense Refill  . aspirin EC 81 MG EC tablet Take 1 tablet (81 mg total) by mouth daily.      Marland Kitchen atorvastatin (LIPITOR) 40 MG tablet Take 1 tablet (40 mg total) by mouth daily at 6 PM.  30 tablet  11  . clonazePAM (KLONOPIN) 1 MG tablet Take 1 tablet (1 mg total) by mouth at bedtime.  30 tablet  0  . clopidogrel (PLAVIX) 75 MG tablet Take 75 mg by mouth daily.        . clotrimazole (LOTRIMIN) 1 % cream Apply 1 application topically 2 (two) times daily. Apply to affected area 2 times daily      . megestrol (MEGACE) 40 MG/ML suspension Take 400 mg by mouth daily.      . metFORMIN (GLUCOPHAGE) 500 MG tablet Take 500 mg by mouth daily.        . metoCLOPramide (REGLAN) 10 MG tablet Take 1 tablet (10 mg total) by mouth every 6 (six) hours as needed (nausea).  20 tablet  0  . metoprolol tartrate (LOPRESSOR) 25 MG tablet Take 0.5 tablets (12.5 mg total) by mouth 2 (two) times daily.  30 tablet  11  . mirtazapine (REMERON) 15 MG tablet Take 1 tablet (15 mg total) by mouth at bedtime.  30 tablet  0  . nitroGLYCERIN (NITROSTAT) 0.4 MG SL tablet Place 1 tablet (0.4 mg total) under the tongue every 5 (five) minutes x 3 doses as needed for chest pain.  25 tablet  4  . omeprazole (PRILOSEC) 40 MG capsule Take 1 capsule (40 mg total) by mouth daily. 30 minutes before breakfast  30 capsule  11  . oxyCODONE (OXY IR/ROXICODONE) 5 MG immediate release tablet Take 2 tablets (10 mg total) by mouth every 8 (eight) hours as needed for pain (may take 2 tablets).  60 tablet  0  . Pancrelipase, Lip-Prot-Amyl, 24000 UNITS CPEP Take 3 capsules by mouth 3 (three) times daily. 3 capsules with meals and 1 with snacks      . QUEtiapine (SEROQUEL) 25 MG tablet Take 25 mg by mouth at bedtime.           Allergies  Allergen Reactions  . Diclofenac Sodium     REACTION: Chronic renal insufficiency.  . Other Other (See Comments)    Blackeyed peas and Pinto Beans...break out in hives    BP 122/80  Pulse 100  Resp 25  Ht 5\' 2"  (1.575 m)  Wt 174 lb (78.926 kg)  BMI 31.83 kg/m2  No results found.   Review of Systems  Constitutional: Negative for fever, chills, diaphoresis, appetite change and fatigue.  HENT: Negative for ear pain, sore throat, trouble swallowing, neck pain and ear discharge.   Eyes: Negative for photophobia, discharge and visual disturbance.  Respiratory: Negative for cough, choking, chest tightness and shortness of breath.   Cardiovascular: Negative for chest pain and palpitations.       Patient walks 20 minutes without difficulty.  No exertional chest/neck/shoulder/arm pain.   Gastrointestinal: Positive for diarrhea, anal bleeding and rectal pain. Negative for nausea, vomiting, abdominal pain and constipation.       No personal nor family history of GI/colon cancer, inflammatory bowel disease, irritable bowel syndrome, allergy such as Celiac Sprue, dietary/dairy problems, colitis, ulcers nor gastritis.  No recent sick contacts/gastroenteritis.  No travel outside the country.  No changes in diet.    Genitourinary: Negative for dysuria, frequency and difficulty urinating.  Musculoskeletal: Negative for myalgias and gait problem.  Skin: Negative for color change, pallor and rash.  Neurological: Negative for dizziness, speech difficulty, weakness and numbness.  Hematological: Negative for adenopathy.  Psychiatric/Behavioral: Negative for confusion and agitation. The patient is not nervous/anxious.        Objective:   Physical Exam  Constitutional: She is oriented to person, place, and time. She appears well-developed and well-nourished. No distress.  HENT:  Head: Normocephalic.  Mouth/Throat: Oropharynx is clear and moist. No oropharyngeal exudate.  Eyes:  Conjunctivae normal and EOM are normal. Pupils are equal, round, and reactive to light. No scleral icterus.  Neck: Normal range of motion. Neck supple. No tracheal deviation present.  Cardiovascular: Normal rate, regular rhythm and intact distal pulses.   Pulmonary/Chest: Effort normal and breath sounds normal. No respiratory distress. She exhibits no tenderness.  Abdominal: Soft. She exhibits no distension, no abdominal bruit and no mass. There is no tenderness. There is no rigidity, no guarding, no CVA tenderness, no tenderness at McBurney's point and negative Murphy's sign. A hernia is present. Hernia confirmed positive in the ventral area. Hernia confirmed negative in the right inguinal area and confirmed negative in the left inguinal area.    Genitourinary:    No vaginal discharge found.       Exam done with assistance of female Medical Assistant in the room.  Perianal skin clean with good hygiene.  No pruritis.  A few small external skin tags but no hemorrhoids of significance.  No pilonidal disease.  No fissure.  No abscess/fistula.    Tolerates digital and anoscopic rectal exam.  Normal sphincter tone.  Sensitive  No rectal masses.  Hemorrhoidal piles enalrged & friable L lateral = R post > R ant   Musculoskeletal: Normal range of motion. She exhibits no tenderness.  Lymphadenopathy:    She has no cervical adenopathy.       Right: No inguinal adenopathy present.       Left: No inguinal adenopathy present.  Neurological: She is alert and oriented to person, place, and time. No cranial nerve deficit. She exhibits normal muscle tone. Coordination normal.  Skin: Skin is warm and dry. No rash noted. She is not diaphoretic. No erythema.  Psychiatric: She has a normal mood and affect. Her behavior is normal. Judgment and thought content normal.       Assessment:     Rectal pain with pruritis ani (psoriasis as  well)  Internal hemorrhoids with pain/friability    Plan:     I offered  many options.  Perhaps slowing her bowels, Pepto-Bismol to one bowel movement a day would help things.  I am skeptical that will be enough.  I did offer banding to her.  She initially hesitated, then felt it was reasonable proceed with that.  Given the fact she just had a heart attack less than two months ago, I strongly discouraged her from any major surgery at this time.  Hopefully, it will not come to that.  The anatomy & physiology of the anorectal region was discussed.  The pathophysiology of hemorrhoids and differential diagnosis was discussed.  Natural history progression  was discussed.   I stressed the importance of a bowel regimen to have daily soft bowel movements to minimize progression of disease.     The patient's symptoms are not adequately controlled.  Therefore, I recommended banding to treat the hemorrhoids.  I went over the technique, risks, benefits, and alternatives.   Goals of post-operative recovery were discussed as well.  Questions were answered.  The patient expressed understanding & wished to proceed.  The patient was positioned in the lateral decubitus position.  Perianal & rectal examination was done.  Using anoscopy, I ligated the hemorrhoids above the dentate line with banding.  The patient tolerated the procedure well.  Educational handouts further explaining the pathology, treatment options, and bowel regimen were given as well.   I also think she has a component of pruritus ani.  A dry patch on the right cheek suspicious for her psoriasis.  A little moist in intergluteal cleft.  Could benefit from drying out and keeping it from getting further irritated.  Avoid scratching as well.  The anatomy and physiology of the perianal region was discussed. Pathophysiology of pruritus ani with symptoms of itching, irritating, cracking, bleeding, etc. discussed. Risks of bacterial and fungal/yeast infections on top of this was discussed.  Discussion about dietary changes, bowel  regimen, minimizing friction trauma (scratching it, dry toilet paper, rubbing) to the area were discussed. Use of wet wipes and hypoallergenic medications was discussed.  The key is to keep the area clean and dry but avoid over moisturization and avoid severely dry skin, keep a happy medium range. Possible need for a short course of antifungal topical medication and/or high/low potency steroids were discussed as well.  Given that the skin is thinned out, I want off on steroids for now.  Handout explaining pathophysiology & treatment was given to the patient as well. Possible need for a biopsy may be needed later on as well for further workup and diagnosis.  The patient expressed understanding and appreciation. We will try and manage this nonoperatively first.  Soft periumbilical ventral hernia, incarcerated with fat.  Would hold off on any surgery on this especially given her numerous health issues and recent heart attack.

## 2012-09-24 NOTE — Patient Instructions (Addendum)
You have had to be your hemorrhoids tied down with rubber bands.  Expect some bleeding for the next week or so.  It should taper off.  Hemorrhoid Banding Hemorrhoids are veins in the anus and lower rectum that become enlarged. The most common symptoms are rectal bleeding, itching, and sometimes pain. Hemorrhoids might come out with straining or having a bowel movement, and they can sometimes be pushed back in. There are internal and external hemorrhoids. Only internal hemorrhoids can be treated with banding. In this procedure, a rubber band is placed near the hemorrhoid tissue, cutting off the blood supply. This procedure prevents the hemorrhoids from slipping down. LET YOUR CAREGIVER KNOW ABOUT: All medicines you are taking, especially blood thinners such as aspirin and coumadin.  RISKS AND COMPLICATIONS This is not a painful procedure, but if you do have intense pain immediately let your surgeon know because the band may need to be removed. You may have some mild pain or discomfort in the first 2 days or so after treatment. Sometimes there may be delayed bleeding in the first week after treatment.  BEFORE THE PROCEDURE  There is no special preparation needed before banding. Your surgeon may have you do an enema prior to the procedure. You will go home the same day.  HOME CARE INSTRUCTIONS   Your surgeon might instruct you to do sitz baths as needed if you have discomfort or after a bowel movement.  You may be instructed to use fiber supplements. SEEK MEDICAL CARE IF:  You have an increase in pain.  Your pain does not get better. SEEK IMMEDIATE MEDICAL CARE IF:  You have intense pain.  Fever greater than 100.5 F (38.1 C).  Bleeding that does not stop, or pus from the anus. Document Released: 08/07/2009 Document Revised: 01/02/2012 Document Reviewed: 08/07/2009 [facility] Patient Information 2013 [place], [place]. HEMORRHOIDS   The rectum is the last few inches of your colon, and it  naturally stretches to hold stool.  Hemorrhoidal piles are natural clusters of blood vessels that help the rectum stretch to hold stool and allow bowel movements to eliminate feces.  Hemorrhoids are abnormally swollen blood vessels in the rectum.  Too much pressure in the rectum causes hemorrhoids by forcing blood to stretch and bulge the walls of the veins, sometimes even rupturing them.  Hemorrhoids can become like varicose veins you might see on a person's legs. When bulging hemorrhoidal veins are irritated, they can swell, burn, itch, become very painful, and bleed. Once the rectal veins have been stretched out and hemorrhoids created, they are difficult to get rid of completely and tend to recur with less straining than it took to cause them in the first place. Fortunately, good habits and simple medical treatment usually control hemorrhoids well, and surgery is only recommended in unusually severe cases. Some of the most frequent causes of hemorrhoids:    Constant sitting    Straining with bowel movements (from constipation or hard stools)    Diarrhea    Sitting on the toilet for a long time    Severe coughing    Childbirth    Heavy Lifting  Types of Hemorrhoids:    Internal hemorrhoids usually don't hurt or itch; they are deep inside the rectum and usually have no sensation. However, internal hemorrhoids can bleed.  Such bleeding should not be ignored and mask blood from a dangerous source like colorectal cancer, so persistent rectal bleeding should be investigated with a colonoscopy.    External hemorrhoids cause  most of the symptoms - pain, burning, and itching. Unirritated hemorrhoids can look like small skin tags coming out of the anus.     Thrombosed hemorrhoids can form when a hemorrhoid blood vessel bursts and causes the hemorrhoid to swell.  A purple blood clot can form in it and become an excruciatingly painful lump at the anus. Because of these unpleasant symptoms, immediate incision  and drainage by a surgeon at an office visit can provide much relief of the pain.    PREVENTION Avoiding the causes listed in above will prevent most cases of hemorrhoids, but this advice is sometimes hard to follow:  How can you avoid sitting all day if you have a seated job? Also, we try to avoid coughing and diarrhea, but sometimes it's beyond your control.  Still, there are some practical hints to help:    If your main job activity is seated, always stand or walk during your breaks. Make it a point to stand and walk at least 5 minutes every hour and try to shift frequently in your chair to avoid direct rectal pressure.    Always exhale as you strain or lift. Don't hold your breath.    Treat coughing, diarrhea and constipation early since irritated hemorrhoids may soon follow.    Do not delay or try to prevent a bowel movement when the urge is present.   Exercise regularly (walking or jogging 60 minutes a day) to stimulate the bowels to move.   Avoid dry toilet paper when cleaning after bowel movements.  Moistened tissues such as baby wipes are less irritating.  Lightly pat the rectal area dry.  Using irrigating showers or bottle irrigation washing can more gently clean this sensitive area.   Keep the anal and genital area clean and  dry.  Talcum or baby powders can help   GET YOUR STOOLS SOFT.   This is the most important way to prevent irritated hemorrhoids.  Hard stools are like sandpaper to the anorectal canal and will cause more problems.   The goal: ONE SOFT BOWEL MOVEMENT A DAY!  To have soft, regular bowel movements:    Drink at least 8 tall glasses of water a day.     AVOID CONSTIPATION    Take plenty of fiber.  Fiber is the undigested part of plant food that passes into the colon, acting s "natures broom" to encourage bowel motility and movement.  Fiber can absorb and hold large amounts of water. This results in a larger, bulkier stool, which is soft and easier to pass. Work gradually  over several weeks up to 6 servings a day of fiber (25g a day even more if needed) in the form of: o Vegetables -- Root (potatoes, carrots, turnips), leafy green (lettuce, salad greens, celery, spinach), or cooked high residue (cabbage, broccoli, etc) o Fruit -- Fresh (unpeeled skin & pulp), Dried (prunes, apricots, cherries, etc ),  or stewed ( applesauce)  o Whole grain breads, pasta, etc (whole wheat)  o Bran cereals    Bulking Agents -- This type of water-retaining fiber generally is easily obtained each day by one of the following:  o Psyllium bran -- The psyllium plant is remarkable because its ground seeds can retain so much water. This product is available as Metamucil, Konsyl, Effersyllium, Per Diem Fiber, or the less expensive generic preparation in drug and health food stores. Although labeled a laxative, it really is not a laxative.  o Methylcellulose -- This is another fiber derived  from wood which also retains water. It is available as Citrucel. o Polyethylene Glycol - and "artificial" fiber commonly called Miralax or Glycolax.  It is helpful for people with gassy or bloated feelings with regular fiber o Flax Seed - a less gassy fiber than psyllium   No reading or other relaxing activity while on the toilet. If bowel movements take longer than 5 minutes, you are too constipated   Laxatives can be useful for a short period if constipation is severe o Osmotics (Milk of Magnesia, Fleets phosphosoda, Magnesium citrate, MiraLax, GoLytely) are safer than  o Stimulants (Senokot, Castor Oil, Dulcolax, Ex Lax)    o Do not take laxatives for more than 7days in a row.   Laxatives are not a good long-term solution as it can stress the intestine and colon and causes too much mineral and fluid losses.    If badly constipated, try a Bowel Retraining Program: o Do not use laxatives.  o Eat a diet high in roughage, such as bran cereals and leafy vegetables.  o Drink six (6) ounces of prune or apricot  juice each morning.  o Eat two (2) large servings of stewed fruit each day.  o Take one (1) heaping dose of a bulking agent (ex. Metamucil, Citrucel, Miralax) twice a day.  o Use sugar-free sweetener when possible to avoid excessive calories.  o Eat a normal breakfast.  o Set aside 15 minutes after breakfast to sit on the toilet, but do not strain to have a bowel movement.  o If you do not have a bowel movement by the third day, use an enema and repeat the above steps.    AVOID DIARRHEA o Switch to liquids and simpler foods for a few days to avoid stressing your intestines further. o Avoid dairy products (especially milk & ice cream) for a short time.  The intestines often can lose the ability to digest lactose when stressed. o Avoid foods that cause gassiness or bloating.  Typical foods include beans and other legumes, cabbage, broccoli, and dairy foods.  Every person has some sensitivity to other foods, so listen to our body and avoid those foods that trigger problems for you. o Adding fiber (Citrucel, Metamucil, psyllium, Miralax) gradually can help thicken stools by absorbing excess fluid and retrain the intestines to act more normally.  Slowly increase the dose over a few weeks.  Too much fiber too soon can backfire and cause cramping & bloating. o Probiotics (such as active yogurt, Align, etc) may help repopulate the intestines and colon with normal bacteria and calm down a sensitive digestive tract.  Most studies show it to be of mild help, though, and such products can be costly. o Medicines:   Bismuth subsalicylate (ex. Kayopectate, Pepto Bismol) every 30 minutes for up to 6 doses can help control diarrhea.  Avoid if pregnant.   Loperamide (Immodium) can slow down diarrhea.  Start with two tablets (4mg  total) first and then try one tablet every 6 hours.  Avoid if you are having fevers or severe pain.  If you are not better or start feeling worse, stop all medicines and call your doctor for  advice o Call your doctor if you are getting worse or not better.  Sometimes further testing (cultures, endoscopy, X-ray studies, bloodwork, etc) may be needed to help diagnose and treat the cause of the diarrhea.   If these preventive measures fail, you must take action right away! Hemorrhoids are one condition that can be mild  in the morning and become intolerable by nightfall.  He also had a rash between your glutei/the cheeks.  That is probably causing itching which we call Anal Pruritus Anal pruritus is an itching of the anus, which is often due to increased moisture of the skin around the anus. Moisture may be due to sweating or a small amount of remaining stool. The itching and scratching can cause further skin damage.  CAUSES   Poor hygiene.  Excessive moisture from sweating or residual stool in the anal area.  Perfumed soaps and sprays and colored toilet paper.  Chemicals in the foods you eat.  Dietary factors such as caffeine, beer, milk products, chocolate, nuts, citrus fruits, tomatoes, spicy seasonings, jalapeno peppers, and salsa.  Hemorrhoids, infections, and other anal diseases.  Excessive washing.  Overuse of laxatives.  Skin disorders (psoriasis, eczema, or seborrhea). HOME CARE INSTRUCTIONS   Practice good hygiene.  Clean the anal area gently with wet toilet paper, baby wipes, or a wet washcloth after every bowel movement and at bedtime. Avoid using soaps on the anal area. Dry the area thoroughly. Pat the area dry with toilet paper or a towel.  Do not scrub the anal area with anything, even toilet paper.  Try not to scratch the itchy area. Scratching produces more damage, which makes the itching worse.  Take sitz baths in warm water for 15 to 20 minutes, 2 to 3 times a day. Pat the area dry with a soft cloth after each bath.  Zinc oxide ointment or a moisture barrier cream can be applied several times daily to protect the skin.  Only take medicines as  directed by your caregiver.  Talk to your caregiver about fiber supplements. These are helpful in normalizing the stool if you have frequent loose stools.  Wear cotton underwear and loose clothing.  Do not use irritants such as bubble baths, scented toilet paper, or genital deodorants. SEEK MEDICAL CARE IF:   Itching does not improve in several days or gets worse.  You have a fever.  There are problems with increased pain, swelling, or redness. MAKE SURE YOU:   Understand these instructions.  Will watch your condition.  Will get help right away if you are not doing well or get worse. Document Released: 04/11/2011 Document Revised: 01/02/2012 Document Reviewed: 04/11/2011 [facility] Patient Information 2013 [place], [place].

## 2012-09-28 ENCOUNTER — Ambulatory Visit (HOSPITAL_COMMUNITY)
Admission: RE | Admit: 2012-09-28 | Discharge: 2012-09-28 | Disposition: A | Discharge status: Home / Self Care | Payer: Medicaid Other | Source: Ambulatory Visit | Attending: Internal Medicine | Admitting: Internal Medicine

## 2012-09-28 DIAGNOSIS — K838 Other specified diseases of biliary tract: Secondary | ICD-10-CM

## 2012-09-28 DIAGNOSIS — K861 Other chronic pancreatitis: Secondary | ICD-10-CM

## 2012-09-28 DIAGNOSIS — R1013 Epigastric pain: Secondary | ICD-10-CM

## 2012-09-28 DIAGNOSIS — K7689 Other specified diseases of liver: Secondary | ICD-10-CM | POA: Insufficient documentation

## 2012-09-28 NOTE — Progress Notes (Signed)
Quick Note:  [place] w/o new info No deterioriation Have her get a 1 view abdomen to see if stent is still in place   ______

## 2012-10-01 ENCOUNTER — Other Ambulatory Visit: Payer: Self-pay

## 2012-10-01 ENCOUNTER — Other Ambulatory Visit: Payer: Self-pay | Admitting: Internal Medicine

## 2012-10-01 DIAGNOSIS — K838 Other specified diseases of biliary tract: Secondary | ICD-10-CM

## 2012-10-01 DIAGNOSIS — Z1231 Encounter for screening mammogram for malignant neoplasm of breast: Secondary | ICD-10-CM

## 2012-10-03 ENCOUNTER — Other Ambulatory Visit: Payer: Self-pay | Admitting: Podiatry

## 2012-10-08 ENCOUNTER — Ambulatory Visit (INDEPENDENT_AMBULATORY_CARE_PROVIDER_SITE_OTHER)
Admission: RE | Admit: 2012-10-08 | Discharge: 2012-10-08 | Disposition: A | Discharge status: Home / Self Care | Payer: Medicaid Other | Source: Ambulatory Visit | Attending: Internal Medicine | Admitting: Internal Medicine

## 2012-10-08 DIAGNOSIS — K838 Other specified diseases of biliary tract: Secondary | ICD-10-CM

## 2012-10-09 ENCOUNTER — Other Ambulatory Visit: Payer: Self-pay

## 2012-10-09 DIAGNOSIS — T85590A Other mechanical complication of bile duct prosthesis, initial encounter: Secondary | ICD-10-CM

## 2012-10-18 ENCOUNTER — Telehealth: Payer: Self-pay | Admitting: Internal Medicine

## 2012-10-18 NOTE — Telephone Encounter (Signed)
Patient requesting that I send her a replacement set of instructions,.  I have placed them in the mail today.

## 2012-10-22 ENCOUNTER — Telehealth: Payer: Self-pay | Admitting: Internal Medicine

## 2012-10-22 NOTE — Telephone Encounter (Signed)
I have reviewed the instructions verbally with the patient

## 2012-10-25 ENCOUNTER — Encounter (HOSPITAL_COMMUNITY)
Admission: RE | Disposition: A | Discharge status: Home / Self Care | Payer: Self-pay | Source: Ambulatory Visit | Attending: Internal Medicine

## 2012-10-25 ENCOUNTER — Ambulatory Visit (HOSPITAL_COMMUNITY)
Admission: RE | Admit: 2012-10-25 | Discharge: 2012-10-25 | Disposition: A | Discharge status: Home / Self Care | Payer: Medicaid Other | Source: Ambulatory Visit | Attending: Internal Medicine | Admitting: Internal Medicine

## 2012-10-25 ENCOUNTER — Encounter (HOSPITAL_COMMUNITY): Payer: Self-pay

## 2012-10-25 ENCOUNTER — Ambulatory Visit (HOSPITAL_COMMUNITY): Payer: Medicaid Other

## 2012-10-25 DIAGNOSIS — K831 Obstruction of bile duct: Secondary | ICD-10-CM | POA: Insufficient documentation

## 2012-10-25 DIAGNOSIS — K861 Other chronic pancreatitis: Secondary | ICD-10-CM | POA: Insufficient documentation

## 2012-10-25 DIAGNOSIS — E119 Type 2 diabetes mellitus without complications: Secondary | ICD-10-CM | POA: Insufficient documentation

## 2012-10-25 DIAGNOSIS — T85590A Other mechanical complication of bile duct prosthesis, initial encounter: Secondary | ICD-10-CM

## 2012-10-25 DIAGNOSIS — I10 Essential (primary) hypertension: Secondary | ICD-10-CM | POA: Insufficient documentation

## 2012-10-25 DIAGNOSIS — Z01812 Encounter for preprocedural laboratory examination: Secondary | ICD-10-CM | POA: Insufficient documentation

## 2012-10-25 DIAGNOSIS — Z4689 Encounter for fitting and adjustment of other specified devices: Secondary | ICD-10-CM | POA: Insufficient documentation

## 2012-10-25 HISTORY — PX: ESOPHAGOGASTRODUODENOSCOPY: SHX5428

## 2012-10-25 LAB — GLUCOSE, CAPILLARY: Glucose-Capillary: 67 mg/dL — ABNORMAL LOW (ref 70–99)

## 2012-10-25 SURGERY — EGD (ESOPHAGOGASTRODUODENOSCOPY)
Anesthesia: Moderate Sedation

## 2012-10-25 MED ORDER — FENTANYL CITRATE 0.05 MG/ML IJ SOLN
INTRAMUSCULAR | Status: AC
  Filled 2012-10-25: qty 4

## 2012-10-25 MED ORDER — FENTANYL CITRATE 0.05 MG/ML IJ SOLN
INTRAMUSCULAR | Status: DC | PRN
  Administered 2012-10-25 (×3): 25 ug via INTRAVENOUS

## 2012-10-25 MED ORDER — BUTAMBEN-TETRACAINE-BENZOCAINE 2-2-14 % EX AERO
INHALATION_SPRAY | CUTANEOUS | Status: DC | PRN
  Administered 2012-10-25: 2 via TOPICAL

## 2012-10-25 MED ORDER — DIPHENHYDRAMINE HCL 50 MG/ML IJ SOLN
INTRAMUSCULAR | Status: AC
  Filled 2012-10-25: qty 1

## 2012-10-25 MED ORDER — SODIUM CHLORIDE 0.9 % IV SOLN
INTRAVENOUS | Status: DC
  Administered 2012-10-25: 500 mL via INTRAVENOUS

## 2012-10-25 MED ORDER — OXYCODONE HCL 10 MG PO TABS: 10.0000 mg | ORAL_TABLET | Freq: Four times a day (QID) | ORAL | Status: DC | PRN

## 2012-10-25 MED ORDER — DIPHENHYDRAMINE HCL 50 MG/ML IJ SOLN
INTRAMUSCULAR | Status: DC | PRN
  Administered 2012-10-25: 25 mg via INTRAVENOUS

## 2012-10-25 MED ORDER — MIDAZOLAM HCL 10 MG/2ML IJ SOLN
INTRAMUSCULAR | Status: DC | PRN
  Administered 2012-10-25 (×4): 2 mg via INTRAVENOUS

## 2012-10-25 MED ORDER — DEXTROSE 50 % IV SOLN
50.0000 mL | Freq: Once | INTRAVENOUS | Status: DC
  Filled 2012-10-25: qty 50

## 2012-10-25 MED ORDER — GLUCAGON HCL (RDNA) 1 MG IJ SOLR
INTRAMUSCULAR | Status: AC
  Filled 2012-10-25: qty 1

## 2012-10-25 MED ORDER — MIDAZOLAM HCL 10 MG/2ML IJ SOLN
INTRAMUSCULAR | Status: AC
  Filled 2012-10-25: qty 4

## 2012-10-25 NOTE — H&P (Signed)
Cc:  Chronic pancreatitis, to remove biliary stent  HPI: S/p placement of biliary stent for ? CBD stricture - in setting of chronic pancreatitis. Stent placement did not seem to improve pain or biochemical abnormalitis.   Allergies  Allergen Reactions  . Diclofenac Sodium     REACTION: Chronic renal insufficiency.  . Other Other (See Comments)    Blackeyed peas and Pinto Beans...break out in hives  Medications reviewed. Past Medical History  Diagnosis Date  . Diabetes mellitus   . 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 Coronary stent occlusion   . GERD (gastroesophageal reflux disease)   . Hypertension     no medication currently B/P has been ok  . Pneumonia     12/02/11 pneumonia  . Stented coronary artery   . Tobacco dependence   . Alcohol abuse     abstinent since 2009  . Insomnia   . Psoriasis   . Hemorrhoids   . Colon adenoma 04/09/2012    diminutive cecal  . Coronary artery disease   . HLD (hyperlipidemia)   . History of MI (myocardial infarction)   . Hiatal hernia   . Common bile duct stricture   . GERD (gastroesophageal reflux disease)    Past Surgical History  Procedure Date  . Cholecystectomy 1976  . Abdominal hysterectomy 1994  . Carotid stent insertion 2006/2013  . Eus 01/05/2012    Procedure: UPPER ENDOSCOPIC ULTRASOUND (EUS) LINEAR;  Surgeon: Rachael Fee, MD;  Location: [facility];  Service: Endoscopy;  Laterality: N/A;  radial linear  . Colonoscopy     multiple  . Ercp 07/25/2012    Procedure: ENDOSCOPIC RETROGRADE CHOLANGIOPANCREATOGRAPHY (ERCP);  Surgeon: Iva Boop, MD;  Location: [facility];  Service: Endoscopy;  Laterality: N/A;  with stent   History   Social History  . Marital Status: Single    Spouse Name: N/A    Number of Children: N/A  . Years of Education: N/A   Occupational History  . RETIRED    Social History Main Topics  . Smoking status: Current Every Day Smoker -- 0.5 packs/day for 15  years  . Smokeless tobacco: Never Used     Comment: TRYING TO CUT BACK.  Marland Kitchen Alcohol Use: No     Comment: past h/o alcohol abuse quit 4/09  . Drug Use: No  . Sexually Active: No   Other Topics Concern  . None   Social History Narrative  . None   Family History  Problem Relation Age of Onset  . Malignant hyperthermia Neg Hx   . Colon cancer Mother     colon resection  . Coronary artery disease Brother   . Renal Disease Brother   . Cirrhosis Father   . Diabetes Mother    PE: See pre-deation evaluation  Ass/Plan:  Chronic pancreatitis with dilated biliary tree - for biliary stent removal. The risks and benefits as well as alternatives of endoscopic procedure(s) have been discussed and reviewed. All questions answered. The patient agrees to proceed.

## 2012-10-25 NOTE — Op Note (Signed)
[facility] [address] [place], [address]   ENDOSCOPY PROCEDURE REPORT  PATIENT: [name] Foley, [name].  MR#: [id number] BIRTHDATE: 04-17-1946 , 66  yrs. old GENDER: Female ENDOSCOPIST: Iva Boop, MD, [facility] PROCEDURE DATE:  10/25/2012 PROCEDURE:  EGD w/ fb removal ASA CLASS:     Class III INDICATIONS:  remove biliary stent. MEDICATIONS: Fentanyl 75 mcg IV, Versed 8 mg IV, and Benadryl 25 mg IV TOPICAL ANESTHETIC: Cetacaine Spray  DESCRIPTION OF PROCEDURE: After the risks benefits and alternatives of the procedure were thoroughly explained, informed consent was obtained.  The duodenoscope       was introduced through the mouth and advanced to the second portion of the duodenum. Without limitations.  The instrument was slowly withdrawn as the mucosa was fully examined.        DUODENUM: A foreign body was found in the ampulla.   Indwelling biliary stent was removed using a snare and fluoroscopic guidance confirmed complete removal.  The remainder of the upper endoscopy exam was otherwise normal, with limitations on views as typical for duodenoscope.  Retroflexed views revealed no abnormalities.     The scope was then withdrawn from the patient and the procedure completed.  COMPLICATIONS: There were no complications. ENDOSCOPIC IMPRESSION: 1.   A biliary was removed from the common bile duct via the ampulla - she did not seem to improve with stent placement 2.   The remainder of the upper endoscopy exam was otherwise normal - limited views (duodenoscope used)  RECOMMENDATIONS: Call office next 2-3 days to schedule an office appointment for late January/early February Oxycodone 10 mg every 6 hrs prn #60 no refill prescribed today    eSigned:  Iva Boop, MD, [facility] 10/25/2012 1:03 PM   CC:The Patient and Fleet Contras, MD

## 2012-10-26 ENCOUNTER — Encounter (HOSPITAL_COMMUNITY): Payer: Self-pay | Admitting: Internal Medicine

## 2012-11-12 ENCOUNTER — Ambulatory Visit
Admission: RE | Admit: 2012-11-12 | Discharge: 2012-11-12 | Disposition: A | Discharge status: Home / Self Care | Payer: Medicaid Other | Source: Ambulatory Visit | Attending: Internal Medicine | Admitting: Internal Medicine

## 2012-11-12 DIAGNOSIS — Z1231 Encounter for screening mammogram for malignant neoplasm of breast: Secondary | ICD-10-CM

## 2012-11-28 ENCOUNTER — Ambulatory Visit: Payer: Medicaid Other | Admitting: Internal Medicine

## 2012-12-03 ENCOUNTER — Other Ambulatory Visit (INDEPENDENT_AMBULATORY_CARE_PROVIDER_SITE_OTHER): Payer: Medicaid Other

## 2012-12-03 ENCOUNTER — Ambulatory Visit (INDEPENDENT_AMBULATORY_CARE_PROVIDER_SITE_OTHER): Payer: Medicaid Other | Admitting: Internal Medicine

## 2012-12-03 ENCOUNTER — Encounter: Payer: Self-pay | Admitting: Internal Medicine

## 2012-12-03 VITALS — BP 130/84 | HR 80 | Ht 62.0 in | Wt 172.4 lb

## 2012-12-03 DIAGNOSIS — K860 Alcohol-induced chronic pancreatitis: Secondary | ICD-10-CM

## 2012-12-03 DIAGNOSIS — R1013 Epigastric pain: Secondary | ICD-10-CM

## 2012-12-03 DIAGNOSIS — K838 Other specified diseases of biliary tract: Secondary | ICD-10-CM

## 2012-12-03 DIAGNOSIS — G8929 Other chronic pain: Secondary | ICD-10-CM

## 2012-12-03 DIAGNOSIS — K861 Other chronic pancreatitis: Secondary | ICD-10-CM

## 2012-12-03 LAB — HEPATIC FUNCTION PANEL
ALT: 22 U/L (ref 0–35)
AST: 27 U/L (ref 0–37)
Alkaline Phosphatase: 136 U/L — ABNORMAL HIGH (ref 39–117)
Bilirubin, Direct: 0.1 mg/dL (ref 0.0–0.3)
Total Bilirubin: 0.3 mg/dL (ref 0.3–1.2)

## 2012-12-03 MED ORDER — OXYCODONE HCL 10 MG PO TABS: 10.0000 mg | ORAL_TABLET | Freq: Four times a day (QID) | ORAL | Status: DC | PRN

## 2012-12-03 NOTE — Patient Instructions (Addendum)
Your physician has requested that you go to the basement for the following lab work before leaving today: Hepatic Function Test  You have been given a written rx for oxycodone today.  Dr. Leone Payor will call you with plans regarding options for pain management.  Follow up with [place] in three months.  Thank you for choosing me and [facility].  Iva Boop, M.D., [facility]

## 2012-12-03 NOTE — Progress Notes (Signed)
Subjective:    Patient ID: [name] Foley, female    DOB: [birth year], 67 y.o.   MRN: [id number]  HPI [name] returns for followup of her chronic pancreatitis. She is managing using oxycodone, and needs a refill. She denies the area, she is on pancreatic enzyme supplements. She has been stable and no difference as I removed her biliary stent in January. She received flu and pneumonia vaccination when she was hospitalized in late 2013. She just had a mammogram that was okay she tells me.  Allergies  Allergen Reactions  . Diclofenac Sodium     REACTION: Chronic renal insufficiency.  . Other Other (See Comments)    Blackeyed peas and Pinto Beans...break out in hives   Outpatient Prescriptions Prior to Visit  Medication Sig Dispense Refill  . aspirin EC 81 MG EC tablet Take 1 tablet (81 mg total) by mouth daily.      Marland Kitchen atorvastatin (LIPITOR) 40 MG tablet Take 1 tablet (40 mg total) by mouth daily at 6 PM.  30 tablet  11  . clonazePAM (KLONOPIN) 1 MG tablet Take 1 tablet (1 mg total) by mouth at bedtime.  30 tablet  0  . clopidogrel (PLAVIX) 75 MG tablet Take 75 mg by mouth daily.        . clotrimazole (LOTRIMIN) 1 % cream Apply 1 application topically 2 (two) times daily. Apply to affected area 2 times daily      . megestrol (MEGACE) 40 MG/ML suspension Take 400 mg by mouth daily.      . metFORMIN (GLUCOPHAGE) 500 MG tablet Take 500 mg by mouth daily.        . metoCLOPramide (REGLAN) 10 MG tablet Take 1 tablet (10 mg total) by mouth every 6 (six) hours as needed (nausea).  20 tablet  0  . metoprolol tartrate (LOPRESSOR) 25 MG tablet Take 0.5 tablets (12.5 mg total) by mouth 2 (two) times daily.  30 tablet  11  . mirtazapine (REMERON) 15 MG tablet Take 1 tablet (15 mg total) by mouth at bedtime.  30 tablet  0  . nitroGLYCERIN (NITROSTAT) 0.4 MG SL tablet Place 1 tablet (0.4 mg total) under the tongue every 5 (five) minutes x 3 doses as needed for chest pain.  25 tablet  4  . omeprazole  (PRILOSEC) 40 MG capsule Take 1 capsule (40 mg total) by mouth daily. 30 minutes before breakfast  30 capsule  11  . Pancrelipase, Lip-Prot-Amyl, 24000 UNITS CPEP Take 3 capsules by mouth 3 (three) times daily. 3 capsules with meals and 1 with snacks      . QUEtiapine (SEROQUEL) 25 MG tablet Take 25 mg by mouth at bedtime.      . Oxycodone HCl 10 MG TABS Take 1 tablet (10 mg total) by mouth every 6 (six) hours as needed.  60 tablet  0   No facility-administered medications prior to visit.   Past Medical History  Diagnosis Date  . Diabetes mellitus   . 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 Coronary stent occlusion   . GERD (gastroesophageal reflux disease)   . Hypertension     no medication currently B/P has been ok  . Pneumonia     12/02/11 pneumonia  . Stented coronary artery   . Tobacco dependence   . Alcohol abuse     abstinent since 2009  . Insomnia   . Psoriasis   . Hemorrhoids   . Colon adenoma 04/09/2012  diminutive cecal  . Coronary artery disease   . HLD (hyperlipidemia)   . History of MI (myocardial infarction)   . Hiatal hernia   . Common bile duct stricture   . GERD (gastroesophageal reflux disease)    Past Surgical History  Procedure Laterality Date  . Cholecystectomy  1976  . Abdominal hysterectomy  1994  . Carotid stent insertion  2006/2013  . Eus  01/05/2012    Procedure: UPPER ENDOSCOPIC ULTRASOUND (EUS) LINEAR;  Surgeon: Rachael Fee, MD;  Location: [facility];  Service: Endoscopy;  Laterality: N/A;  radial linear  . Colonoscopy      multiple  . Ercp  07/25/2012    Procedure: ENDOSCOPIC RETROGRADE CHOLANGIOPANCREATOGRAPHY (ERCP);  Surgeon: Iva Boop, MD;  Location: [facility];  Service: Endoscopy;  Laterality: N/A;  with stent  . Esophagogastroduodenoscopy  10/25/2012    Procedure: ESOPHAGOGASTRODUODENOSCOPY (EGD);  Surgeon: Iva Boop, MD;  Location: [facility];  Service: Endoscopy;  Laterality: N/A;  EGD with  stent removal using ERCP scope   She lives alone her apartment, 2 sisters are still in town. They're doing okay to   Review of Systems As per history of present illness    Objective:   Physical Exam General:  NAD Eyes:   anicteric Abdomen:  soft and mild to moderately tender with fullness in the epigastrium as before, BS+ Ext:   no edema    Data Reviewed:  Wt Readings from Last 3 Encounters:  12/03/12 172 lb 6 oz (78.189 kg)  10/25/12 174 lb (78.926 kg)  10/25/12 174 lb (78.926 kg)      Assessment & Plan:   1. Chronic alcoholic pancreatitis   2. Abdominal pain, chronic, epigastric   3. Dilated bile duct    1. She is stable and maintaining. I'm going to look into whether or not there is an option for celiac plexus ablation versus possible surgical therapy for her chronic pancreatitis. I don't think there is any rush for either but she believes is worthwhile pursuing to see if she could reduce or illuminate narcotic use. 2. Refill oxycodone 10 mg every 6 hours as needed #60 no refills. 3. Return in 3 months, as long as stable can refill meds until then. 4. I will call her back regarding possible intervention to reduce pain.  CC: Dorrene German, MD

## 2012-12-27 NOTE — Progress Notes (Signed)
Quick Note:  Let her know that labs are better  I reviewed her case with other doctors and we think taking medicines makes more sense than surgery on pancreas  She need to see me in May/June  She needs to let us know a few days before her oxycodone runs out so it can be refilled - if she cannot get to [place] will need t let us know in time to mail the rx ______

## 2012-12-31 ENCOUNTER — Other Ambulatory Visit: Payer: Self-pay

## 2012-12-31 MED ORDER — OXYCODONE HCL 10 MG PO TABS: 10.0000 mg | ORAL_TABLET | Freq: Four times a day (QID) | ORAL | Status: DC | PRN

## 2013-02-12 ENCOUNTER — Telehealth: Payer: Self-pay | Admitting: Internal Medicine

## 2013-02-12 MED ORDER — OXYCODONE HCL 10 MG PO TABS: 10.0000 mg | ORAL_TABLET | Freq: Four times a day (QID) | ORAL | Status: DC | PRN

## 2013-02-12 NOTE — Telephone Encounter (Signed)
Sheri please advise on refill, thank you!

## 2013-02-12 NOTE — Telephone Encounter (Signed)
Dr. Leone Payor out of town therefore Amy [employer] PA-C signed oxycodone rx.  This refill was appropriate according to last office note.  Patient was informed and will come tomorrow to pick up, also a follow up appointment was made for 03/12/13 at 10:45am.

## 2013-02-12 NOTE — Telephone Encounter (Signed)
According to notes in 12/03/12 Dr. Leone Payor said ok to refill pain meds until office visit in May.  Will need to get an MD/PA to agree to refill and sign rx.  She also needs an office visit in May.

## 2013-03-12 ENCOUNTER — Encounter: Payer: Self-pay | Admitting: Internal Medicine

## 2013-03-12 ENCOUNTER — Ambulatory Visit (INDEPENDENT_AMBULATORY_CARE_PROVIDER_SITE_OTHER): Payer: Medicare Other | Admitting: Internal Medicine

## 2013-03-12 VITALS — BP 116/78 | HR 82 | Ht 62.0 in | Wt 182.0 lb

## 2013-03-12 DIAGNOSIS — K861 Other chronic pancreatitis: Secondary | ICD-10-CM

## 2013-03-12 DIAGNOSIS — R1013 Epigastric pain: Secondary | ICD-10-CM

## 2013-03-12 DIAGNOSIS — G8929 Other chronic pain: Secondary | ICD-10-CM

## 2013-03-12 MED ORDER — OXYCODONE HCL 10 MG PO TABS: 10.0000 mg | ORAL_TABLET | Freq: Four times a day (QID) | ORAL | Status: DC | PRN

## 2013-03-12 NOTE — Patient Instructions (Addendum)
Today you have been given a written rx for oxycodone.  Follow up with [place] in 4 months.   Thank you for choosing me and [facility].  Iva Boop, M.D., [facility]

## 2013-03-12 NOTE — Progress Notes (Signed)
Subjective:    Patient ID: [name] Foley, female    DOB: May 04, 1946, 67 y.o.   MRN: [id number]  HPI Things are stable overall. No steatorrhea. Says oxycodone still helps 10 mg q6 prn. Requesting refill Says she does not get narcotics  from anyone else Megace helps appetite (weight is up) uses intermittently  Medications, allergies, past medical history, past surgical history, family history and social history are reviewed and updated in the EMR.  Review of Systems As abobve    Objective:   Physical Exam General:  NAD Eyes:   anicteric Lungs:  clear Heart:  S1S2 no rubs, murmurs or gallops Abdomen:  soft and  Mildly tender epigastrium, + umbilical hernia, reducible, BS+ Ext:   no edema    Data Reviewed:  Wt Readings from Last 3 Encounters:  03/12/13 182 lb (82.555 kg)  12/03/12 172 lb 6 oz (78.189 kg)  10/25/12 174 lb (78.926 kg)      Assessment & Plan:  Chronic pancreatitis - Plan: Oxycodone HCl 10 MG TABS  Abdominal pain, chronic, epigastric - Plan: Oxycodone HCl 10 MG TABS  1. Refill oxycodone 10 mg #60 2. REV 4 months 3. Ok to refill oxycodone til then unless changes 4. Consider stopping Megace  - weight stable and up - defer to PCP 5. I have checked on [place] Controlled substances database and she is only getting narcotics from this office.     Medication List       These changes are accurate as of: 03/12/2013 12:45 PM. If you have any questions, ask your nurse or doctor.          TAKE these medications       AMBULATORY NON FORMULARY MEDICATION  Nicotine patch 14mg  use 1 patch every 24 hrs transdermal     aspirin 81 MG EC tablet  Take 1 tablet (81 mg total) by mouth daily.     atorvastatin 40 MG tablet  Commonly known as:  LIPITOR  Take 1 tablet (40 mg total) by mouth daily at 6 PM.     clonazePAM 1 MG tablet  Commonly known as:  KLONOPIN  Take 1 tablet (1 mg total) by mouth at bedtime.     clopidogrel 75 MG tablet  Commonly known as:  PLAVIX   Take 75 mg by mouth daily.     clotrimazole 1 % cream  Commonly known as:  LOTRIMIN  Apply 1 application topically 2 (two) times daily. Apply to affected area 2 times daily     megestrol 40 MG/ML suspension  Commonly known as:  MEGACE  Take 400 mg by mouth daily.     metFORMIN 500 MG tablet  Commonly known as:  GLUCOPHAGE  Take 500 mg by mouth daily.     metoCLOPramide 10 MG tablet  Commonly known as:  REGLAN  Take 1 tablet (10 mg total) by mouth every 6 (six) hours as needed (nausea).     metoprolol tartrate 25 MG tablet  Commonly known as:  LOPRESSOR  Take 0.5 tablets (12.5 mg total) by mouth 2 (two) times daily.     mirtazapine 15 MG tablet  Commonly known as:  REMERON  Take 1 tablet (15 mg total) by mouth at bedtime.     nitroGLYCERIN 0.4 MG SL tablet  Commonly known as:  NITROSTAT  Place 1 tablet (0.4 mg total) under the tongue every 5 (five) minutes x 3 doses as needed for chest pain.     omeprazole 40 MG capsule  Commonly known as:  PRILOSEC  Take 1 capsule (40 mg total) by mouth daily. 30 minutes before breakfast     Oxycodone HCl 10 MG Tabs  Take 1 tablet (10 mg total) by mouth every 6 (six) hours as needed.     Pancrelipase (Lip-Prot-Amyl) 24000 UNITS Cpep  Take 3 capsules by mouth 3 (three) times daily. 3 capsules with meals and 1 with snacks     QUEtiapine 25 MG tablet  Commonly known as:  SEROQUEL  Take 25 mg by mouth at bedtime.        ZO:[id number],[id number] A, MD

## 2013-04-03 ENCOUNTER — Encounter: Payer: Self-pay | Admitting: Cardiovascular Disease

## 2013-04-03 ENCOUNTER — Ambulatory Visit (INDEPENDENT_AMBULATORY_CARE_PROVIDER_SITE_OTHER): Payer: Medicare Other | Admitting: Cardiovascular Disease

## 2013-04-03 VITALS — BP 92/70 | HR 70 | Ht 62.0 in | Wt 179.7 lb

## 2013-04-03 DIAGNOSIS — I2510 Atherosclerotic heart disease of native coronary artery without angina pectoris: Secondary | ICD-10-CM

## 2013-04-03 DIAGNOSIS — I219 Acute myocardial infarction, unspecified: Secondary | ICD-10-CM

## 2013-04-03 DIAGNOSIS — I213 ST elevation (STEMI) myocardial infarction of unspecified site: Secondary | ICD-10-CM

## 2013-04-03 DIAGNOSIS — I10 Essential (primary) hypertension: Secondary | ICD-10-CM

## 2013-04-03 DIAGNOSIS — E785 Hyperlipidemia, unspecified: Secondary | ICD-10-CM

## 2013-04-03 MED ORDER — NICOTINE 14 MG/24HR TD PT24: 1.0000 | MEDICATED_PATCH | TRANSDERMAL | Status: DC

## 2013-04-03 NOTE — Progress Notes (Signed)
04/03/2013 [place] F [name] Foley   Nov 21, 1945  [id number]  Primary Physician Dorrene German, MD Primary Cardiologist: Runell Gess MD Roseanne Reno   HPI:  The patient is a very pleasant 67 year old mildly overweight single [place] American female with no children who I last saw in the office 5 months ago. She has a history of CAD status post proximal LAD stenting by Dr. Alanda Amass in the past with 2 overlapping stents. 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 Risk factors include continued tobacco abuse, hypertension, diabetes and hyperlipidemia. She has had chronic pancreatitis with discontinued tobacco abuse in the past, followed by Dr. Leone Payor. She presented with an acute anterior wall myocardial infarction (STEMI) on October 19 at 12:30 in the morning and underwent urgent angiography by Dr. Peter [place] via the right radial approach. Her proximal LAD was occluded and he placed a 4.0 x 15 mm long Promus drug-eluting stent with excellent result. Her circumflex and RCA had minimal disease and an EF of 55%. She has had no recurrent symptoms on aspirin and Plavix. Her most recent lipid profile performed by Dr. Concepcion Elk revealed a total cholesterol of 85, LDL of 47 and HDL of 25.since I saw her last she denies chest pain or shortness of breath. She does however still smoke.    Current Outpatient Prescriptions  Medication Sig Dispense Refill  . aspirin EC 81 MG EC tablet Take 1 tablet (81 mg total) by mouth daily.      Marland Kitchen atorvastatin (LIPITOR) 40 MG tablet Take 1 tablet (40 mg total) by mouth daily at 6 PM.  30 tablet  11  . clonazePAM (KLONOPIN) 1 MG tablet Take 1 tablet (1 mg total) by mouth at bedtime.  30 tablet  0  . clopidogrel (PLAVIX) 75 MG tablet Take 75 mg by mouth daily.        . clotrimazole (LOTRIMIN) 1 % cream Apply 1 application topically 2 (two) times daily. Apply to affected area 2 times daily      . megestrol (MEGACE) 40 MG/ML suspension  Take 400 mg by mouth daily.      . metFORMIN (GLUCOPHAGE) 500 MG tablet Take 500 mg by mouth daily.        . metoCLOPramide (REGLAN) 10 MG tablet Take 1 tablet (10 mg total) by mouth every 6 (six) hours as needed (nausea).  20 tablet  0  . metoprolol tartrate (LOPRESSOR) 25 MG tablet Take 0.5 tablets (12.5 mg total) by mouth 2 (two) times daily.  30 tablet  11  . mirtazapine (REMERON) 15 MG tablet Take 1 tablet (15 mg total) by mouth at bedtime.  30 tablet  0  . nitroGLYCERIN (NITROSTAT) 0.4 MG SL tablet Place 1 tablet (0.4 mg total) under the tongue every 5 (five) minutes x 3 doses as needed for chest pain.  25 tablet  4  . omeprazole (PRILOSEC) 40 MG capsule Take 1 capsule (40 mg total) by mouth daily. 30 minutes before breakfast  30 capsule  11  . Oxycodone HCl 10 MG TABS Take 1 tablet (10 mg total) by mouth every 6 (six) hours as needed.  60 tablet  0  . Pancrelipase, Lip-Prot-Amyl, 24000 UNITS CPEP Take 3 capsules by mouth 3 (three) times daily. 3 capsules with meals and 1 with snacks      . QUEtiapine (SEROQUEL) 25 MG tablet Take 25 mg by mouth at bedtime.      . AMBULATORY NON FORMULARY MEDICATION Nicotine patch 14mg  use  1 patch every 24 hrs transdermal       No current facility-administered medications for this visit.    Allergies  Allergen Reactions  . Diclofenac Sodium     REACTION: Chronic renal insufficiency.  . Other Other (See Comments)    Blackeyed peas and Pinto Beans...break out in hives    History   Social History  . Marital Status: Single    Spouse Name: N/A    Number of Children: 0  . Years of Education: N/A   Occupational History  . RETIRED    Social History Main Topics  . Smoking status: Current Some Day Smoker -- 0.50 packs/day for 15 years    Types: Cigarettes  . Smokeless tobacco: Never Used     Comment: 2 cigarettes a day  . 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 Drug Use: No  . Sexually Active: No   Other Topics Concern    . Not on file   Social History Narrative  . No narrative on file     Review of Systems: General: negative for chills, fever, night sweats or weight changes.  Cardiovascular: negative for chest pain, dyspnea on exertion, edema, orthopnea, palpitations, paroxysmal nocturnal dyspnea or shortness of breath Dermatological: negative for rash Respiratory: negative for cough or wheezing Urologic: negative for hematuria Abdominal: negative for nausea, vomiting, diarrhea, bright red blood per rectum, melena, or hematemesis Neurologic: negative for visual changes, syncope, or dizziness All other systems reviewed and are otherwise negative except as noted above.    Blood pressure 92/70, pulse 70, height 5\' 2"  (1.575 m), weight 179 lb 11.2 oz (81.511 kg).  General appearance: alert and no distress Neck: no adenopathy, no carotid bruit, no JVD, supple, symmetrical, trachea midline and thyroid not enlarged, symmetric, no tenderness/mass/nodules Lungs: clear to auscultation bilaterally Heart: regular rate and rhythm, S1, S2 normal, no murmur, click, rub or gallop Extremities: extremities normal, atraumatic, no cyanosis or edema  EKG normal sinus rhythm at 70 with nonspecific ST and T wave changes  ASSESSMENT AND PLAN:   STEMI (ST elevation myocardial infarction), with PCI with DES to mid LAD at site of previous stent Status post LAD stenting by Dr. Alanda Amass in the past with 2 overlapping stents of her proximal LAD. She had a STEMI that occurred 08/11/12 and she underwent emergent angiography by Dr. Peter [place] via the right radial approach. Her proximal LAD was occluded and he placed a 4 mm x 15 mm long Promus drug-eluting stent with excellent result. Her circumflex and RCA had minimal disease and her EF was 55%. She's had no recurrent symptoms.  HYPERLIPIDEMIA Followed by Dr. Ginette Otto on statin drug      Runell Gess MD [facility], [facility] 04/03/2013 12:37 PM

## 2013-04-03 NOTE — Assessment & Plan Note (Signed)
Followed by Dr. Ginette Otto on statin drug

## 2013-04-03 NOTE — Patient Instructions (Addendum)
Your physician wants you to follow-up in: 6 months with an extender and 12 months with Dr Berry. You will receive a reminder letter in the mail two months in advance. If you don't receive a letter, please call our office to schedule the follow-up appointment.  

## 2013-04-03 NOTE — Assessment & Plan Note (Signed)
Status post LAD stenting by Dr. Alanda Amass in the past with 2 overlapping stents of her proximal LAD. She had a STEMI that occurred 08/11/12 and she underwent emergent angiography by Dr. Peter [place] via the right radial approach. Her proximal LAD was occluded and he placed a 4 mm x 15 mm long Promus drug-eluting stent with excellent result. Her circumflex and RCA had minimal disease and her EF was 55%. She's had no recurrent symptoms.

## 2013-04-11 ENCOUNTER — Telehealth: Payer: Self-pay | Admitting: Internal Medicine

## 2013-04-11 ENCOUNTER — Ambulatory Visit: Payer: Medicaid Other

## 2013-04-11 DIAGNOSIS — K861 Other chronic pancreatitis: Secondary | ICD-10-CM

## 2013-04-11 DIAGNOSIS — R1013 Epigastric pain: Secondary | ICD-10-CM

## 2013-04-11 MED ORDER — OXYCODONE HCL 10 MG PO TABS: 10.0000 mg | ORAL_TABLET | Freq: Four times a day (QID) | ORAL | Status: DC | PRN

## 2013-04-11 NOTE — Telephone Encounter (Signed)
Spoke to patient and she will try and find a ride out here today.  The lab has a oxy confirmation drug screen test , it has a 3 day turn around time .  Do we have to get results back before she gets rx?

## 2013-04-11 NOTE — Telephone Encounter (Signed)
OK Let her know she needs a urine drug screen to see that she is taking the medication - do that today and then she can have the Rx

## 2013-04-11 NOTE — Telephone Encounter (Signed)
Please advise if ok to refill oxycodone HCL 10mg ?  Thank you.

## 2013-04-11 NOTE — Telephone Encounter (Signed)
No - just needs to submit - do the test you found

## 2013-04-11 NOTE — Telephone Encounter (Signed)
Patient informed that needs to submit urine and then may come up and get rx per Dr. Leone Payor.  She hopes to get her sister to bring her.  Oxy rx will be printed, signed and put up front for pick up , I will note on the envelope to make sure patient did lab first.

## 2013-04-12 LAB — OXYCODONE SCREEN, UA, RFLX CONFIRM

## 2013-04-16 LAB — OPIATES/OPIOIDS (LC/MS-MS)
Codeine Urine: NEGATIVE ng/mL
Heroin (6-AM), UR: NEGATIVE ng/mL
Hydrocodone: NEGATIVE ng/mL
Hydromorphone: NEGATIVE ng/mL
Morphine Urine: NEGATIVE ng/mL
Norhydrocodone, Ur: NEGATIVE ng/mL
Noroxycodone, Ur: 623 ng/mL
Oxycodone, ur: 172 ng/mL
Oxymorphone: 104 ng/mL

## 2013-04-30 ENCOUNTER — Ambulatory Visit (INDEPENDENT_AMBULATORY_CARE_PROVIDER_SITE_OTHER): Payer: Medicaid Other | Admitting: General Surgery

## 2013-05-13 ENCOUNTER — Encounter (INDEPENDENT_AMBULATORY_CARE_PROVIDER_SITE_OTHER): Payer: Self-pay | Admitting: Surgery

## 2013-05-13 ENCOUNTER — Encounter (INDEPENDENT_AMBULATORY_CARE_PROVIDER_SITE_OTHER): Payer: Self-pay

## 2013-05-13 ENCOUNTER — Ambulatory Visit (INDEPENDENT_AMBULATORY_CARE_PROVIDER_SITE_OTHER): Payer: Medicare Other | Admitting: Surgery

## 2013-05-13 ENCOUNTER — Telehealth: Payer: Self-pay | Admitting: Internal Medicine

## 2013-05-13 VITALS — BP 126/76 | HR 72 | Temp 97.8°F | Resp 16 | Ht 62.0 in | Wt 181.8 lb

## 2013-05-13 DIAGNOSIS — K861 Other chronic pancreatitis: Secondary | ICD-10-CM

## 2013-05-13 DIAGNOSIS — K648 Other hemorrhoids: Secondary | ICD-10-CM

## 2013-05-13 DIAGNOSIS — R197 Diarrhea, unspecified: Secondary | ICD-10-CM

## 2013-05-13 DIAGNOSIS — G8929 Other chronic pain: Secondary | ICD-10-CM

## 2013-05-13 MED ORDER — OXYCODONE HCL 10 MG PO TABS: 10.0000 mg | ORAL_TABLET | Freq: Four times a day (QID) | ORAL | Status: DC | PRN

## 2013-05-13 NOTE — Telephone Encounter (Signed)
Ok to refill per Dr. Leone Payor, patient informed and will come pick up signed rx.

## 2013-05-13 NOTE — Progress Notes (Signed)
Subjective:     Patient ID: [name] Foley, female   DOB: Oct 17, 1946, 67 y.o.   MRN: [id number]  HPI   [name] Foley  10/14/1964 [id number]  Patient Care Team: Dorrene German, MD as PCP - General (Internal Medicine) Iva Boop, MD as Consulting Physician (Gastroenterology) Runell Gess, MD as Consulting Physician (Cardiology)  This patient is a 67 y.o.female who presents today for surgical evaluation at the request of Dr. Concepcion Elk.   Reason for evaluation: Rectal pain.  Hemorrhoids.  Elderly woman with chronic pancreatitis and diarrhea.  On chronic narcotics.  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  History of diarrhea.  Under better control now.  Bowel movements three times a day.  More well formed.  Also history of coronary artery disease.  Had a recurrent heart attack.  Needed a new CAD drug eluting stent placed in October 2013.  She is on chronic Plavix.  The patient has been struggling with rectal pain for a over year.  Preparation H can help with the burning.  I saw her in December.  I placed him bands.  They fell off after a couple weeks.  She still struggles with discomfort.  Still feels some prolapse.  Painful to have bowel movement but denies any fissures.  No drainage.  No major bleeding.  No fevers or chills.  She has never had any treatments or banding or injection done.  No hemorrhoid surgery done.  Had an open cholecystectomy in 1976.  Claims she can walk up to 40 minutes without problems.  Patient Active Problem List   Diagnosis Date Noted  . Hyperlipidemia   . Pruritus ani 09/24/2012  . Internal hemorrhoids with pain & intermittent prolapse 09/24/2012  . Diarrhea, possible steatodiarrhea 09/24/2012  . STEMI (ST elevation myocardial infarction), with PCI with DES to mid LAD at site of previous stent 08/11/2012  . Periumbilical hernia 05/10/2012  . Personal history of colonic polyp-adenoma 04/09/2012  .  Chronic diarrhea - ? steatorrhea vs. IBS or both 04/09/2012  . Nasal congestion 04/06/2012  . Cough 04/06/2012  . CAP (community acquired pneumonia) 12/06/2011  . Common bile duct (CBD) stenosis? 12/03/2011  . PANCREATITIS, CHRONIC  08/13/2008  . PSORIASIS 08/13/2008  . ALCOHOL ABUSE, HX OF 03/21/2008  . HYPERLIPIDEMIA 01/25/2007  . CAD- LAD DES 2007, patent 2008 01/25/2007  . DIABETES MELLITUS II, UNCOMPLICATED 12/21/2006  . TOBACCO DEPENDENCE 12/21/2006  . HYPERTENSION, BENIGN SYSTEMIC 12/21/2006  . INSOMNIA NOS 12/21/2006    Past Medical History  Diagnosis Date  . Diabetes mellitus   . 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 Coronary stent occlusion   . GERD (gastroesophageal reflux disease)   . Hypertension     no medication currently B/P has been ok  . Pneumonia     12/02/11 pneumonia  . Stented coronary artery   . Tobacco dependence   . Alcohol abuse     abstinent since 2009  . Insomnia   . Psoriasis   . Hemorrhoids   . Colon adenoma 04/09/2012    diminutive cecal  . Coronary artery disease   . HLD (hyperlipidemia)   . History of MI (myocardial infarction)   . Hiatal hernia   . Common bile duct stricture   . GERD (gastroesophageal reflux disease)   . Hyperlipidemia     Past Surgical History  Procedure Laterality Date  . Cholecystectomy  1976  .  Abdominal hysterectomy  1994  . Carotid stent insertion  2006/2013  . Eus  01/05/2012    Procedure: UPPER ENDOSCOPIC ULTRASOUND (EUS) LINEAR;  Surgeon: Rachael Fee, MD;  Location: [facility];  Service: Endoscopy;  Laterality: N/A;  radial linear  . Colonoscopy      multiple  . Ercp  07/25/2012    Procedure: ENDOSCOPIC RETROGRADE CHOLANGIOPANCREATOGRAPHY (ERCP);  Surgeon: Iva Boop, MD;  Location: [facility];  Service: Endoscopy;  Laterality: N/A;  with stent  . Esophagogastroduodenoscopy  10/25/2012    Procedure: ESOPHAGOGASTRODUODENOSCOPY (EGD);  Surgeon: Iva Boop, MD;   Location: [facility];  Service: Endoscopy;  Laterality: N/A;  EGD with stent removal using ERCP scope    History   Social History  . Marital Status: Single    Spouse Name: N/A    Number of Children: 0  . Years of Education: N/A   Occupational History  . RETIRED    Social History Main Topics  . Smoking status: Current Some Day Smoker -- 0.50 packs/day for 15 years    Types: Cigarettes  . Smokeless tobacco: Never Used     Comment: 2 cigarettes a day  . 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 Drug Use: No  . Sexually Active: No   Other Topics Concern  . Not on file   Social History Narrative  . No narrative on file    Family History  Problem Relation Age of Onset  . Malignant hyperthermia Neg Hx   . Colon cancer Mother     colon resection  . Coronary artery disease Brother   . Renal Disease Brother   . Cirrhosis Father   . Diabetes Mother     Current Outpatient Prescriptions  Medication Sig Dispense Refill  . AMBULATORY NON FORMULARY MEDICATION Nicotine patch 14mg  use 1 patch every 24 hrs transdermal      . aspirin EC 81 MG EC tablet Take 1 tablet (81 mg total) by mouth daily.      Marland Kitchen atorvastatin (LIPITOR) 40 MG tablet Take 1 tablet (40 mg total) by mouth daily at 6 PM.  30 tablet  11  . clonazePAM (KLONOPIN) 1 MG tablet Take 1 tablet (1 mg total) by mouth at bedtime.  30 tablet  0  . clopidogrel (PLAVIX) 75 MG tablet Take 75 mg by mouth daily.        . clotrimazole (LOTRIMIN) 1 % cream Apply 1 application topically 2 (two) times daily. Apply to affected area 2 times daily      . megestrol (MEGACE) 40 MG/ML suspension Take 400 mg by mouth daily.      . metFORMIN (GLUCOPHAGE) 500 MG tablet Take 500 mg by mouth daily.        . metoCLOPramide (REGLAN) 10 MG tablet Take 1 tablet (10 mg total) by mouth every 6 (six) hours as needed (nausea).  20 tablet  0  . metoprolol tartrate (LOPRESSOR) 25 MG tablet Take 0.5 tablets (12.5 mg total) by mouth 2  (two) times daily.  30 tablet  11  . mirtazapine (REMERON) 15 MG tablet Take 1 tablet (15 mg total) by mouth at bedtime.  30 tablet  0  . nicotine (NICODERM CQ) 14 mg/24hr patch Place 1 patch onto the skin daily.  28 patch  0  . nitroGLYCERIN (NITROSTAT) 0.4 MG SL tablet Place 1 tablet (0.4 mg total) under the tongue every 5 (five) minutes x 3 doses as needed  for chest pain.  25 tablet  4  . omeprazole (PRILOSEC) 40 MG capsule Take 1 capsule (40 mg total) by mouth daily. 30 minutes before breakfast  30 capsule  11  . Oxycodone HCl 10 MG TABS Take 1 tablet (10 mg total) by mouth every 6 (six) hours as needed.  60 tablet  0  . Pancrelipase, Lip-Prot-Amyl, 24000 UNITS CPEP Take 3 capsules by mouth 3 (three) times daily. 3 capsules with meals and 1 with snacks      . QUEtiapine (SEROQUEL) 25 MG tablet Take 25 mg by mouth at bedtime.       No current facility-administered medications for this visit.     Allergies  Allergen Reactions  . Diclofenac Sodium     REACTION: Chronic renal insufficiency.  . Other Other (See Comments)    Blackeyed peas and Pinto Beans...break out in hives    BP 126/76  Pulse 72  Temp(Src) 97.8 F (36.6 C) (Temporal)  Resp 16  Ht 5\' 2"  (1.575 m)  Wt 181 lb 12.8 oz (82.464 kg)  BMI 33.24 kg/m2  No results found.   Review of Systems  Constitutional: Negative for fever, chills, diaphoresis, appetite change and fatigue.  HENT: Negative for ear pain, sore throat, trouble swallowing, neck pain and ear discharge.   Eyes: Negative for photophobia, discharge and visual disturbance.  Respiratory: Negative for cough, choking, chest tightness and shortness of breath.   Cardiovascular: Negative for chest pain and palpitations.       Patient walks 20 minutes without difficulty.  No exertional chest/neck/shoulder/arm pain.   Gastrointestinal: Positive for diarrhea, anal bleeding and rectal pain. Negative for nausea, vomiting, abdominal pain and constipation.       No  personal nor family history of GI/colon cancer, inflammatory bowel disease, irritable bowel syndrome, allergy such as Celiac Sprue, dietary/dairy problems, colitis, ulcers nor gastritis.  No recent sick contacts/gastroenteritis.  No travel outside the country.  No changes in diet.    Genitourinary: Negative for dysuria, frequency and difficulty urinating.  Musculoskeletal: Negative for myalgias and gait problem.  Skin: Negative for color change, pallor and rash.  Neurological: Negative for dizziness, speech difficulty, weakness and numbness.  Hematological: Negative for adenopathy.  Psychiatric/Behavioral: Negative for confusion and agitation. The patient is not nervous/anxious.        Objective:   Physical Exam  Constitutional: She is oriented to person, place, and time. She appears well-developed and well-nourished. No distress.  HENT:  Head: Normocephalic.  Mouth/Throat: Oropharynx is clear and moist. No oropharyngeal exudate.  Eyes: Conjunctivae and EOM are normal. Pupils are equal, round, and reactive to light. No scleral icterus.  Neck: Normal range of motion. Neck supple. No tracheal deviation present.  Cardiovascular: Normal rate, regular rhythm and intact distal pulses.   Pulmonary/Chest: Effort normal and breath sounds normal. No respiratory distress. She exhibits no tenderness.  Abdominal: Soft. She exhibits no distension, no abdominal bruit and no mass. There is no tenderness. There is no rigidity, no guarding, no CVA tenderness, no tenderness at McBurney's point and negative Murphy's sign. A hernia is present. Hernia confirmed positive in the ventral area. Hernia confirmed negative in the right inguinal area and confirmed negative in the left inguinal area.    Genitourinary:    No vaginal discharge found.  Exam done with assistance of female Medical Assistant in the room.  Perianal skin clean with good hygiene.  Mild pink hypopigmentation/thinning but no major pruritis.   Circumferential external hemorrhoids.  No pilonidal  disease.  No fissure.  No abscess/fistula.    Tolerates digital rectal exam barely.  Cannot tolerate anoscope..  Normal sphincter tone.  Very sensitive  No rectal masses.  Hemorrhoidal piles enlarged   Musculoskeletal: Normal range of motion. She exhibits no tenderness.  Lymphadenopathy:    She has no cervical adenopathy.       Right: No inguinal adenopathy present.       Left: No inguinal adenopathy present.  Neurological: She is alert and oriented to person, place, and time. No cranial nerve deficit. She exhibits normal muscle tone. Coordination normal.  Skin: Skin is warm and dry. No rash noted. She is not diaphoretic. No erythema.  Psychiatric: She has a normal mood and affect. Her behavior is normal. Judgment and thought content normal.       Assessment:     Rectal pain with pruritis ani (psoriasis as well)  Internal hemorrhoids with pain/friability    Plan:     I offered many options.  Perhaps slowing her bowels, Pepto-Bismol to one bowel movement a day would help things.  I am skeptical that will be enough.  Consider adding Imodium.  Bowel regimen per gastroenterology.  At this point, the only thing I can offer is hemorrhoidal ligation and pexy.  She seems more sensitive.  Hemorrhoids are more prominent.  I think her bowels or is it is certainly.  She is interested in proceeding with surgery.  I did discuss this with her:  The anatomy & physiology of the anorectal region was discussed.  The pathophysiology of hemorrhoids and differential diagnosis was discussed.  Natural history risks without surgery was discussed.   I stressed the importance of a bowel regimen to have daily soft bowel movements to minimize progression of disease.  Interventions such as sclerotherapy & banding were discussed.  The patient's symptoms are not adequately controlled by medicines and other non-operative treatments.  I feel the risks & problems of no  surgery outweigh the operative risks; therefore, I recommended surgery to treat the hemorrhoids by ligation, pexy, and possible resection.  Risks such as bleeding, infection, need for further treatment, heart attack, death, and other risks were discussed.   I noted a good likelihood this will help address the problem.  Goals of post-operative recovery were discussed as well.  Possibility that this will not correct all symptoms was explained.  Post-operative pain, bleeding, constipation, and other problems after surgery were discussed.  We will work to minimize complications.   Educational handouts further explaining the pathology, treatment options, and bowel regimen were given as well.  Questions were answered.  The patient expresses understanding & wishes to proceed with surgery.  I am concerned about the health of the patient and the ability to tolerate the operation.  Therefore, we will request clearance by cardiology to better assess operative risk & see if a reevaluation, further workup, adjustment to medications, etc is needed.  I suspect this may need to be delayed since her drug eluting stent placed in October 2013 And she may need to stay on Plavix longer before surgery is OK.  She claims she has been off Plavix for other procedures without problems.  We will see what her cardiologist thinks   Pruritus ani much improved.  Could benefit from drying out and keeping it from getting further irritated.  Avoid scratching as well.  Soft periumbilical ventral hernia, incarcerated with fat.  Would hold off on any surgery on this especially given her numerous health issues and recent  heart attack.

## 2013-05-13 NOTE — Patient Instructions (Addendum)
See the Handout(s) we gave you.  Consider surgery.  Please call our office at [phone] if you wish to schedule surgery or if you have further questions / concerns.   HEMORRHOIDS  The rectum is the last foot of your colon, and it naturally stretches to hold stool.  Hemorrhoidal piles are natural clusters of blood vessels that help the rectum and anal canal stretch to hold stool and allow bowel movements to eliminate feces.   Hemorrhoids are abnormally swollen blood vessels in the rectum.  Too much pressure in the rectum causes hemorrhoids by forcing blood to stretch and bulge the walls of the veins, sometimes even rupturing them.  Hemorrhoids can become like varicose veins you might see on a person's legs.  Most people will develop a flare of hemorrhoids in their lifetime.  When bulging hemorrhoidal veins are irritated, they can swell, burn, itch, cause pain, and bleed.  Most flares will calm down gradually own within a few weeks.  However, once hemorrhoids are created, they are difficult to get rid of completely and tend to flare more easily than the first flare.   Fortunately, good habits and simple medical treatment usually control hemorrhoids well, and surgery is needed only in severe cases. Types of Hemorrhoids:  Internal hemorrhoids usually don't initially hurt or itch; they are deep inside the rectum and usually have no sensation. If they begin to push out (prolapse), pain and burning can occur.  However, internal hemorrhoids can bleed.  Anal bleeding should not be ignored since bleeding could come from a dangerous source like colorectal cancer, so persistent rectal bleeding should be investigated by a doctor, sometimes with a colonoscopy.  External hemorrhoids cause most of the symptoms - pain, burning, and itching. Nonirritated hemorrhoids can look like small skin tags coming out of the anus.   Thrombosed hemorrhoids can form when a hemorrhoid blood vessel bursts and causes the hemorrhoid to  suddenly swell.  A purple blood clot can form in it and become an excruciatingly painful lump at the anus. Because of these unpleasant symptoms, immediate incision and drainage by a surgeon at an office visit can provide much relief of the pain.    PREVENTION Avoiding the most frequent causes listed below will prevent most cases of hemorrhoids: Constipation Hard stools Diarrhea  Constant sitting  Straining with bowel movements Sitting on the toilet for a long time  Severe coughing  episodes Pregnancy / Childbirth  Heavy Lifting  Sometimes avoiding the above triggers is difficult:  How can you avoid sitting all day if you have a seated job? Also, we try to avoid coughing and diarrhea, but sometimes it's beyond your control.  Still, there are some practical hints to help: Keep the anal and genital area clean.  Moistened tissues such as flushable wet wipes are less irritating than toilet paper.  Using irrigating showers or bottle irrigation washing gently cleans this sensitive area.   Avoid dry toilet paper when cleaning after bowel movements.  Marland Kitchen Keep the anal and genital area dry.  Lightly pat the rectal area dry.  Avoid rubbing.  Talcum or baby powders can help GET YOUR STOOLS SOFT.   This is the most important way to prevent irritated hemorrhoids.  Hard stools are like sandpaper to the anorectal canal and will cause more problems.  The goal: ONE SOFT BOWEL MOVEMENT A DAY!  BMs from every other day to 3 times a day is a tolerable range Treat coughing, diarrhea and constipation early since irritated  hemorrhoids may soon follow.  If your main job activity is seated, always stand or walk during your breaks. Make it a point to stand and walk at least 5 minutes every hour and try to shift frequently in your chair to avoid direct rectal pressure.  Always exhale as you strain or lift. Don't hold your breath.  Do not delay or try to prevent a bowel movement when the urge is present. Exercise regularly  (walking or jogging 60 minutes a day) to stimulate the bowels to move. No reading or other activity while on the toilet. If bowel movements take longer than 5 minutes, you are too constipated. AVOID CONSTIPATION Drink plenty of liquids (1 1/2 to 2 quarts of water and other fluids a day unless fluid restricted for another medical condition). Liquids that contain caffeine (coffee a, tea, soft drinks) can be dehydrating and should be avoided until constipation is controlled. Consider minimizing milk, as dairy products may be constipating. Eat plenty of fiber (30g a day ideal, more if needed).  Fiber is the undigested part of plant food that passes into the colon, acting as "natures broom" to encourage bowel motility and movement.  Fiber can absorb and hold large amounts of water. This results in a larger, bulkier stool, which is soft and easier to pass.  Eating foods high in fiber - 12 servings - such as  Vegetables: Root (potatoes, carrots, turnips), Leafy green (lettuce, salad greens, celery, spinach), High residue (cabbage, broccoli, etc.) Fruit: Fresh, Dried (prunes, apricots, cherries), Stewed (applesauce)  Whole grain breads, pasta, whole wheat Bran cereals, muffins, etc. Consider adding supplemental bulking fiber which retains large volumes of water: Psyllium ground seeds --available as Metamucil, Konsyl, Effersyllium, Per Diem Fiber, or the less expensive generic forms.  Citrucel  (methylcellulose wood fiber) . FiberCon (Polycarbophil) Polyethylene Glycol - and "artificial" fiber commonly called Miralax or Glycolax.  It is helpful for people with gassy or bloated feelings with regular fiber Flax Seed - a less gassy natural fiber  Laxatives can be useful for a short period if constipation is severe Osmotics (Milk of Magnesia, Fleets Phospho-Soda, Magnesium Citrate)  Stimulants (Senokot,   Castor Oil,  Dulcolax, Ex-Lax)    Laxatives are not a good long-term solution as it can stress the bowels  and cause too much mineral loss and dehydration.   Avoid taking laxatives for more than 7 days in a row.  AVOID DIARRHEA Switch to liquids and simpler foods for a few days to avoid stressing your intestines further. Avoid dairy products (especially milk & ice cream) for a short time.  The intestines often can lose the ability to digest lactose when stressed. Avoid foods that cause gassiness or bloating.  Typical foods include beans and other legumes, cabbage, broccoli, and dairy foods.  Every person has some sensitivity to other foods, so listen to your body and avoid those foods that trigger problems for you. Adding fiber (Citrucel, Metamucil, FiberCon, Flax seed, Miralax) gradually can help thicken stools by absorbing excess fluid and retrain the intestines to act more normally.  Slowly increase the dose over a few weeks.  Too much fiber too soon can backfire and cause cramping & bloating. Probiotics (such as active yogurt, Align, etc) may help repopulate the intestines and colon with normal bacteria and calm down a sensitive digestive tract.  Most studies show it to be of mild help, though, and such products can be costly. Medicines: Bismuth subsalicylate (ex. Kayopectate, Pepto Bismol) every 30 minutes for up to  6 doses can help control diarrhea.  Avoid if pregnant. Loperamide (Immodium) can slow down diarrhea.  Start with two tablets (4mg  total) first and then try one tablet every 6 hours.  Avoid if you are having fevers or severe pain.  If you are not better or start feeling worse, stop all medicines and call your doctor for advice Call your doctor if you are getting worse or not better.  Sometimes further testing (cultures, endoscopy, X-ray studies, bloodwork, etc) may be needed to help diagnose and treat the cause of the diarrhea. TREATMENT OF HEMORRHOID FLARE If these preventive measures fail, you must take action right away! Hemorrhoids are one condition that can be mild in the morning and  become intolerable by nightfall. Most hemorrhoidal flares take several weeks to calm down.  These suggestions can help: Warm soaks.  This helps more than any topical medication.  Use up to 8 times a day.  Usually sitz baths or sitting in a warm bathtub helps.  Sitting on moist warm towels are helpful.  Switching to ice packs/cool compresses can be helpful Normalize your bowels.  Extremes of diarrhea or constipation will make hemorrhoids worse.  One soft bowel movement a day is the goal.  Fiber can help get your bowels regular Wet wipes instead of toilet paper Pain control with a NSAID such as ibuprofen (Advil) or naproxen (Aleve) or acetaminophen (Tylenol) around the clock.  Narcotics are constipating and should be minimized if possible Topical creams contain steroids (bydrocortisone) or local anesthetic (xylocaine) can help make pain and itching more tolerable.   EVALUATION If hemorrhoids are still causing problems, you could benefit by an evaluation by a surgeon.  The surgeon will obtain a history and examine you.  If hemorrhoids are diagnosed, some therapies can be offered in the office, usually with an anoscope into the less sensitive area of the rectum: -injection of hemorrhoids (sclerotherapy) can scar the blood vessels of the swollen/enlarged hemorrhoids to help shrink them down to a more normal size -rubber banding of the enlarged hemorrhoids to help shrink them down to a more normal size -drainage of the blood clot causing a thrombosed hemorrhoid,  to relieve the severe pain   While 90% of the time such problems from hemorrhoids can be managed without preceding to surgery, sometimes the hemorrhoids require a operation to control the problem (uncontrolled bleeding, prolapse, pain, etc.).   This involves being placed under general anesthesia where the surgeon can confirm the diagnosis and remove, suture, or staple the hemorrhoid(s).  Your surgeon can help you treat the problem appropriately.      [facility] PREP INSTRUCTIONS for Anal/Rectal Surgery:   Obtain what you need at a pharmacy of your choice:      A bottle of Milk of Magnesia   DAY PRIOR TO SURGERY:    Switch to drinking liquids or pureed foods only    1:00pm    o Take 2 oz (4 tablespoons) Milk of Magnesia.     Midnight:  Do not eat or drink anything after midnight the night before your surgery.   MORNING OF PROCEDURE:    Remember to not to drink or eat anything that morning     If you have questions or problems, please call [facility] [phone] speak to someone in the clinic department at our office

## 2013-05-13 NOTE — Telephone Encounter (Signed)
Please advise if ok to rx, she saw Dr. Michaell Cowing this AM for her consult.

## 2013-05-29 ENCOUNTER — Other Ambulatory Visit: Payer: Self-pay

## 2013-06-05 ENCOUNTER — Telehealth (INDEPENDENT_AMBULATORY_CARE_PROVIDER_SITE_OTHER): Payer: Self-pay

## 2013-06-05 ENCOUNTER — Telehealth: Payer: Self-pay | Admitting: Cardiovascular Disease

## 2013-06-05 NOTE — Telephone Encounter (Signed)
Message forwarded to K. Vogel, RN.  

## 2013-06-05 NOTE — Telephone Encounter (Signed)
LM requesting the status of cardiac clearance request that I faxed over on 7/21. The pt is currently on Plavix so I need to find out the directions of the pt stopping the Plavix and clearance for surgery.

## 2013-06-05 NOTE — Telephone Encounter (Signed)
Checking on her [name] Foley  for surgery,also need to find out about stopping her Plavix.

## 2013-06-10 ENCOUNTER — Telehealth: Payer: Self-pay | Admitting: Internal Medicine

## 2013-06-10 DIAGNOSIS — K861 Other chronic pancreatitis: Secondary | ICD-10-CM

## 2013-06-10 DIAGNOSIS — G8929 Other chronic pain: Secondary | ICD-10-CM

## 2013-06-10 MED ORDER — OXYCODONE HCL 10 MG PO TABS: 10.0000 mg | ORAL_TABLET | Freq: Four times a day (QID) | ORAL | Status: DC | PRN

## 2013-06-10 NOTE — Telephone Encounter (Signed)
Advise regarding refill Sir, thank you. 

## 2013-06-10 NOTE — Telephone Encounter (Signed)
OK to refill oxycodone - same rx as last time

## 2013-06-10 NOTE — Telephone Encounter (Signed)
Patient informed that oxycodone refilled and written rx up front for pick up.

## 2013-06-11 NOTE — Telephone Encounter (Signed)
Their office called back to let me know that Dr Allyson Sabal will be in the office on 8/20 and they will check with him then on the clearance.

## 2013-06-14 ENCOUNTER — Encounter: Payer: Self-pay | Admitting: *Deleted

## 2013-06-14 NOTE — Telephone Encounter (Signed)
Dr Hazle Coca office called to notify us the pt has been cleared for surgery but she CAN NOT STOP the Plavix until September 2014. They will be faxing [place] a written note to show Dr Michaell Cowing.

## 2013-06-14 NOTE — Telephone Encounter (Signed)
I spoke with [name] Foley.  I advised that Dr Allyson Sabal said that she can have the surgery, but cannot stop the plavix until at least November.  [name] Foley verbalized understanding.

## 2013-06-14 NOTE — Telephone Encounter (Signed)
Please advise 

## 2013-06-14 NOTE — Telephone Encounter (Signed)
LM w/DeeDee she is going to check with Dr Hazle Coca nurse about the clearance and call me back.

## 2013-06-14 NOTE — Telephone Encounter (Signed)
She had a Promus drug-eluting stent placed in her LAD by Dr. Peter [place] 08/11/12. She cannot stop her dual antiplatelet therapy until November of this year

## 2013-06-17 NOTE — Telephone Encounter (Signed)
Would wait until the Fall per cardiology rec's

## 2013-06-19 NOTE — Telephone Encounter (Signed)
Called pt to let her know that Dr Allyson Sabal stated that she is cleared for surgery but she CAN'T STOP her PLAVIX till November due to the stent that was placed last October. I advised pt that she could call us in November to schedule surgery as long as she doesn't have any problems between now and then. The pt understands to call us in November.

## 2013-07-08 ENCOUNTER — Telehealth: Payer: Self-pay | Admitting: Internal Medicine

## 2013-07-08 DIAGNOSIS — G8929 Other chronic pain: Secondary | ICD-10-CM

## 2013-07-08 DIAGNOSIS — K861 Other chronic pancreatitis: Secondary | ICD-10-CM

## 2013-07-08 MED ORDER — OXYCODONE HCL 10 MG PO TABS: 10.0000 mg | ORAL_TABLET | Freq: Four times a day (QID) | ORAL | Status: DC | PRN

## 2013-07-08 NOTE — Telephone Encounter (Signed)
Ok to refill 

## 2013-07-08 NOTE — Telephone Encounter (Signed)
Patient notified to come pick up RX Patient will be here today

## 2013-08-01 ENCOUNTER — Telehealth: Payer: Self-pay | Admitting: Internal Medicine

## 2013-08-01 DIAGNOSIS — K861 Other chronic pancreatitis: Secondary | ICD-10-CM

## 2013-08-01 DIAGNOSIS — G8929 Other chronic pain: Secondary | ICD-10-CM

## 2013-08-01 NOTE — Telephone Encounter (Signed)
Please advise regarding refill Sir, thank you. 

## 2013-08-02 MED ORDER — OXYCODONE HCL 10 MG PO TABS: 10.0000 mg | ORAL_TABLET | Freq: Four times a day (QID) | ORAL | Status: DC | PRN

## 2013-08-02 NOTE — Telephone Encounter (Signed)
Spoke to patient and informed her that her hydrocodone rx is up front for pick up.

## 2013-08-02 NOTE — Telephone Encounter (Signed)
Ok to refill hydrocodone? 

## 2013-08-20 ENCOUNTER — Other Ambulatory Visit: Payer: Self-pay | Admitting: Internal Medicine

## 2013-08-27 ENCOUNTER — Other Ambulatory Visit (INDEPENDENT_AMBULATORY_CARE_PROVIDER_SITE_OTHER): Payer: Self-pay | Admitting: Surgery

## 2013-08-27 ENCOUNTER — Telehealth (INDEPENDENT_AMBULATORY_CARE_PROVIDER_SITE_OTHER): Payer: Self-pay | Admitting: Surgery

## 2013-08-27 NOTE — Telephone Encounter (Signed)
orders placed

## 2013-08-27 NOTE — Telephone Encounter (Signed)
Pt ready for hems sx/ last OV 7/14

## 2013-08-28 ENCOUNTER — Telehealth: Payer: Self-pay | Admitting: Cardiovascular Disease

## 2013-08-28 ENCOUNTER — Telehealth (INDEPENDENT_AMBULATORY_CARE_PROVIDER_SITE_OTHER): Payer: Self-pay

## 2013-08-28 ENCOUNTER — Telehealth: Payer: Self-pay | Admitting: Family Medicine

## 2013-08-28 NOTE — Telephone Encounter (Signed)
LM for Dr Hazle Coca nurse to call me so I can make sure about the pt's instructions for the Plavix before we schedule the pt's surgery for the end of the month.

## 2013-08-28 NOTE — Telephone Encounter (Signed)
Please call,need to talk to you about stopping her Plavix before surgery.

## 2013-08-28 NOTE — Telephone Encounter (Signed)
Returned call to [employer].  On hold x 3 mins.  RN ended call and called back.  Spoke w/ Baxter Hire, triage nurse.  RN asked if she could tell what surgery pt is having.  Baxter Hire stated pt is having a hemorrhoidal ligation under anesthesia.  Informed Dr. Allyson Sabal will be notified.  Verbalized understanding.  Message forwarded to K. Petra Kuba, RN to discuss w/ Dr. Allyson Sabal.

## 2013-08-28 NOTE — Telephone Encounter (Signed)
Please call,need to talk to you about stopping her Plavix before surgery. °

## 2013-08-29 ENCOUNTER — Other Ambulatory Visit: Payer: Self-pay

## 2013-08-29 ENCOUNTER — Telehealth: Payer: Self-pay | Admitting: Internal Medicine

## 2013-08-29 DIAGNOSIS — K861 Other chronic pancreatitis: Secondary | ICD-10-CM

## 2013-08-29 DIAGNOSIS — G8929 Other chronic pain: Secondary | ICD-10-CM

## 2013-08-29 MED ORDER — OXYCODONE HCL 10 MG PO TABS: 10.0000 mg | ORAL_TABLET | Freq: Four times a day (QID) | ORAL | Status: DC | PRN

## 2013-08-29 NOTE — Telephone Encounter (Signed)
Please advise if refill ok Sir, thank you.

## 2013-08-29 NOTE — Telephone Encounter (Signed)
Refill ok? 

## 2013-08-29 NOTE — Telephone Encounter (Signed)
Patient informed that rx requested will be up front for pick up , she plans on coming tomorrow.

## 2013-08-29 NOTE — Telephone Encounter (Signed)
Will await until okay to hold Plavix before proceeding with THD hemorrhoidal ligation. Our reason of waiting until the fall was to give the 12 months since placement of her stents. Until cardiology feels it is still not safe to come off Plavix, I'm not excited about proceeding with hemorrhoidal ligation and pexy with bleeding risks. Will try and manage it nonoperatively until cleared to come off Plavix preoperatively.  Ardeth Sportsman, M.D., F.A.C.S. Gastrointestinal and Minimally Invasive Surgery [facility] [address] [place], [place] [address] [phone] Main / Paging

## 2013-08-29 NOTE — Telephone Encounter (Signed)
Pt cannot stop plavix per Dr Allyson Sabal.  Form faxed to Dr Michaell Cowing

## 2013-09-04 ENCOUNTER — Telehealth (INDEPENDENT_AMBULATORY_CARE_PROVIDER_SITE_OTHER): Payer: Self-pay | Admitting: Surgery

## 2013-09-04 ENCOUNTER — Telehealth: Payer: Self-pay | Admitting: *Deleted

## 2013-09-04 NOTE — Telephone Encounter (Signed)
Letter was faxed from [employer] to Dr Michaell Cowing.  I will have Dr Allyson Sabal sign the original and fax that as well.

## 2013-09-04 NOTE — Telephone Encounter (Signed)
Message copied by Marella Bile on Wed Sep 04, 2013 10:10 AM ------      Message from: Runell Gess      Created: Wed Sep 04, 2013  9:35 AM      Regarding: Stoppipng DAPT       I just spoke with Dr Michaell Cowing about stopping DAPT on [name] Foley for hemorrhoid surgery. It's been over a year since implant so I gave him clearance to do that.            JJB ------

## 2013-09-04 NOTE — Telephone Encounter (Signed)
I called and discussed with the patient's cardiologist, Dr. Erlene Quan.  Patient had stenting from a myocardial infarction in October 2013.  It now has been over a year.  He notes that in June 2014 thatshe had pretty good exercise tolerance and seem to be doing well.  On reevaluation, he feels it is safe for the patient to proceed with [facility] hemorrhoidal ligation off Plavix for a brief period.  We will try and set up to hold Plavix 4 days preoperatively then proceeded start postoperative day one.  Try to have a narrow window.  His office will send clarifying information in support of this conversation.  Ardeth Sportsman, M.D., F.A.C.S. Gastrointestinal and Minimally Invasive Surgery [facility] [address] [place], [place] [address] [phone] Main / Paging

## 2013-09-05 ENCOUNTER — Encounter (INDEPENDENT_AMBULATORY_CARE_PROVIDER_SITE_OTHER): Payer: Self-pay

## 2013-09-05 ENCOUNTER — Telehealth (INDEPENDENT_AMBULATORY_CARE_PROVIDER_SITE_OTHER): Payer: Self-pay

## 2013-09-05 NOTE — Telephone Encounter (Signed)
Pt advised to stop the Plavix 5 days before surgery per the cardiac clearance note received from Dr Hazle Coca office. The pt understands.

## 2013-09-05 NOTE — Telephone Encounter (Signed)
Called pt to let her know that Dr Michaell Cowing did speak with Dr Allyson Sabal about the issue of coming off the Plavix and they both agree for the pt to stop the Plavix for her to have the [facility] sx by Dr Michaell Cowing. I need to find out if the pt is supposed to stop the Plavix 5 days before surgery like we would suggest or if Dr Allyson Sabal has a different plan. I will get back in touch with the pt. The pt has surgery planned for 09/17/13.

## 2013-09-16 ENCOUNTER — Other Ambulatory Visit: Payer: Self-pay | Admitting: Cardiology

## 2013-09-16 ENCOUNTER — Other Ambulatory Visit: Payer: Self-pay | Admitting: Internal Medicine

## 2013-09-16 NOTE — Telephone Encounter (Signed)
Rx was sent to pharmacy electronically. 

## 2013-09-17 DIAGNOSIS — K648 Other hemorrhoids: Secondary | ICD-10-CM

## 2013-09-17 HISTORY — PX: HEMORRHOID SURGERY: SHX153

## 2013-09-26 ENCOUNTER — Ambulatory Visit: Payer: Medicaid Other | Admitting: Cardiology

## 2013-09-30 ENCOUNTER — Telehealth: Payer: Self-pay | Admitting: Internal Medicine

## 2013-09-30 DIAGNOSIS — K861 Other chronic pancreatitis: Secondary | ICD-10-CM

## 2013-09-30 DIAGNOSIS — G8929 Other chronic pain: Secondary | ICD-10-CM

## 2013-09-30 MED ORDER — OXYCODONE HCL 10 MG PO TABS: 10.0000 mg | ORAL_TABLET | Freq: Four times a day (QID) | ORAL | Status: DC | PRN

## 2013-09-30 NOTE — Telephone Encounter (Signed)
Dr. Leone Payor out of office today so spoke to [facility] about refill.  She approved it and signed rx put up front for pick up.  Patient informed and will come by today to get since she is totally out of her oxy.

## 2013-10-07 ENCOUNTER — Encounter (INDEPENDENT_AMBULATORY_CARE_PROVIDER_SITE_OTHER): Payer: Medicaid Other | Admitting: Surgery

## 2013-10-18 ENCOUNTER — Encounter: Payer: Self-pay | Admitting: Internal Medicine

## 2013-10-23 ENCOUNTER — Encounter (INDEPENDENT_AMBULATORY_CARE_PROVIDER_SITE_OTHER): Payer: Self-pay | Admitting: Surgery

## 2013-10-23 ENCOUNTER — Ambulatory Visit (INDEPENDENT_AMBULATORY_CARE_PROVIDER_SITE_OTHER): Payer: Medicare Other | Admitting: Surgery

## 2013-10-23 VITALS — BP 126/70 | HR 84 | Temp 98.6°F | Resp 14 | Ht 62.0 in | Wt 178.0 lb

## 2013-10-23 DIAGNOSIS — K648 Other hemorrhoids: Secondary | ICD-10-CM

## 2013-10-23 MED ORDER — OXYCODONE HCL 5 MG PO TABS: 5.0000 mg | ORAL_TABLET | Freq: Four times a day (QID) | ORAL | Status: DC | PRN

## 2013-10-23 NOTE — Progress Notes (Signed)
Subjective:     Patient ID: [name] Foley, female   DOB: 12-26-1945, 67 y.o.   MRN: [id number]  HPI  Note: This dictation was prepared with Dragon/digital dictation along with [employer]. Any transcriptional errors that result from this process are unintentional.       [name] Foley  1946-01-17 [id number]  Patient Care Team: Dorrene German, MD as PCP - General (Internal Medicine) Iva Boop, MD as Consulting Physician (Gastroenterology) Runell Gess, MD as Consulting Physician (Cardiology)  Procedure (Date: 09/17/2013):  Hemorrhoidal ligation and pexy using THD system  This patient returns for surgical re-evaluation.  She has struggled with postoperative pain.  She has been constipated.  She has been drinking olive oil to help with this.  She forgot to start any fiber supplement.  She Round out of her narcotic pain medicines.  She still has soreness.  Noticed some blood with bowel movements.  That is improved.  She complained that she been hurting for the past month, but she never called [place].  Apparently she got confused and I guess called her gastroenterologist instead  Patient Active Problem List   Diagnosis Date Noted  . Hyperlipidemia   . Pruritus ani 09/24/2012  . Internal hemorrhoids with pain & intermittent prolapse s/p THD hem ligation/pexy 09/17/2013 09/24/2012  . STEMI (ST elevation myocardial infarction), with PCI with DES to mid LAD at site of previous stent 08/11/2012  . Periumbilical hernia 05/10/2012  . Personal history of colonic polyp-adenoma 04/09/2012  . Chronic diarrhea - ? steatorrhea vs. IBS or both 04/09/2012  . Nasal congestion 04/06/2012  . Cough 04/06/2012  . CAP (community acquired pneumonia) 12/06/2011  . Common bile duct (CBD) stenosis? 12/03/2011  . PANCREATITIS, CHRONIC  08/13/2008  . PSORIASIS 08/13/2008  . ALCOHOL ABUSE, HX OF 03/21/2008  . HYPERLIPIDEMIA 01/25/2007  . CAD- LAD DES 2007, patent 2008 01/25/2007  .  DIABETES MELLITUS II, UNCOMPLICATED 12/21/2006  . TOBACCO DEPENDENCE 12/21/2006  . HYPERTENSION, BENIGN SYSTEMIC 12/21/2006  . INSOMNIA NOS 12/21/2006    Past Medical History  Diagnosis Date  . Diabetes mellitus   . 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 Coronary stent occlusion   . GERD (gastroesophageal reflux disease)   . Hypertension     no medication currently B/P has been ok  . Pneumonia     12/02/11 pneumonia  . Stented coronary artery   . Tobacco dependence   . Alcohol abuse     abstinent since 2009  . Insomnia   . Psoriasis   . Hemorrhoids   . Colon adenoma 04/09/2012    diminutive cecal  . Coronary artery disease   . HLD (hyperlipidemia)   . History of MI (myocardial infarction)   . Hiatal hernia   . Common bile duct stricture   . GERD (gastroesophageal reflux disease)   . Hyperlipidemia     Past Surgical History  Procedure Laterality Date  . Cholecystectomy  1976  . Abdominal hysterectomy  1994  . Carotid stent insertion  2006/2013  . Eus  01/05/2012    Procedure: UPPER ENDOSCOPIC ULTRASOUND (EUS) LINEAR;  Surgeon: Rachael Fee, MD;  Location: [facility];  Service: Endoscopy;  Laterality: N/A;  radial linear  . Colonoscopy      multiple  . Ercp  07/25/2012    Procedure: ENDOSCOPIC RETROGRADE CHOLANGIOPANCREATOGRAPHY (ERCP);  Surgeon: Iva Boop, MD;  Location: [facility];  Service: Endoscopy;  Laterality: N/A;  with stent  .  Esophagogastroduodenoscopy  10/25/2012    Procedure: ESOPHAGOGASTRODUODENOSCOPY (EGD);  Surgeon: Iva Boop, MD;  Location: [facility];  Service: Endoscopy;  Laterality: N/A;  EGD with stent removal using ERCP scope  . Hemorrhoid surgery  09/17/2013    THD hem ligation/pexy    History   Social History  . Marital Status: Single    Spouse Name: N/A    Number of Children: 0  . Years of Education: N/A   Occupational History  . RETIRED    Social History Main Topics  . Smoking status: Current  Some Day Smoker -- 0.50 packs/day for 15 years    Types: Cigarettes  . Smokeless tobacco: Never Used     Comment: 2 cigarettes a day  . 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 Drug Use: No  . Sexual Activity: No   Other Topics Concern  . Not on file   Social History Narrative  . No narrative on file    Family History  Problem Relation Age of Onset  . Malignant hyperthermia Neg Hx   . Colon cancer Mother     colon resection  . Coronary artery disease Brother   . Renal Disease Brother   . Cirrhosis Father   . Diabetes Mother     Current Outpatient Prescriptions  Medication Sig Dispense Refill  . AMBULATORY NON FORMULARY MEDICATION Nicotine patch 14mg  use 1 patch every 24 hrs transdermal      . aspirin EC 81 MG EC tablet Take 1 tablet (81 mg total) by mouth daily.      Marland Kitchen atorvastatin (LIPITOR) 40 MG tablet TAKE ONE (1) TABLET EACH DAY AT 6PM  30 tablet  5  . clonazePAM (KLONOPIN) 1 MG tablet Take 1 tablet (1 mg total) by mouth at bedtime.  30 tablet  0  . clopidogrel (PLAVIX) 75 MG tablet Take 75 mg by mouth daily.        . megestrol (MEGACE) 40 MG/ML suspension Take 400 mg by mouth daily.      . metFORMIN (GLUCOPHAGE) 500 MG tablet Take 500 mg by mouth daily.        . metoCLOPramide (REGLAN) 10 MG tablet Take 1 tablet (10 mg total) by mouth every 6 (six) hours as needed (nausea).  20 tablet  0  . metoprolol tartrate (LOPRESSOR) 25 MG tablet TAKE 1/2 TABLET TWICE DAILY  30 tablet  5  . mirtazapine (REMERON) 15 MG tablet Take 1 tablet (15 mg total) by mouth at bedtime.  30 tablet  0  . nicotine (NICODERM CQ) 14 mg/24hr patch Place 1 patch onto the skin daily.  28 patch  0  . nitroGLYCERIN (NITROSTAT) 0.4 MG SL tablet Place 1 tablet (0.4 mg total) under the tongue every 5 (five) minutes x 3 doses as needed for chest pain.  25 tablet  4  . omeprazole (PRILOSEC) 40 MG capsule TAKE ONE (1) CAPSULE EACH DAY 30 MINUTES BEFORE BREAKFAST  30 capsule  7  .  Pancrelipase, Lip-Prot-Amyl, 24000 UNITS CPEP Take 3 capsules by mouth 3 (three) times daily. 3 capsules with meals and 1 with snacks      . QUEtiapine (SEROQUEL) 25 MG tablet Take 25 mg by mouth at bedtime.       No current facility-administered medications for this visit.     Allergies  Allergen Reactions  . Diclofenac Sodium     REACTION: Chronic renal insufficiency.  . Other Other (See Comments)  Blackeyed peas and Pinto Beans...break out in hives    BP 126/70  Pulse 84  Temp(Src) 98.6 F (37 C) (Temporal)  Resp 14  Ht 5\' 2"  (1.575 m)  Wt 178 lb (80.74 kg)  BMI 32.55 kg/m2  No results found.   Review of Systems  Constitutional: Negative for fever, chills and diaphoresis.  HENT: Negative for ear pain, sore throat and trouble swallowing.   Eyes: Negative for photophobia and visual disturbance.  Respiratory: Negative for cough and choking.   Cardiovascular: Negative for chest pain and palpitations.  Gastrointestinal: Negative for nausea, vomiting, abdominal pain, diarrhea, constipation, anal bleeding and rectal pain.  Genitourinary: Negative for dysuria, frequency and difficulty urinating.  Musculoskeletal: Negative for gait problem and myalgias.  Skin: Negative for color change, pallor and rash.  Neurological: Negative for dizziness, speech difficulty, weakness and numbness.  Hematological: Negative for adenopathy.  Psychiatric/Behavioral: Negative for confusion and agitation. The patient is not nervous/anxious.        Objective:   Physical Exam  Constitutional: She is oriented to person, place, and time. She appears well-developed and well-nourished. No distress.  HENT:  Head: Normocephalic.  Mouth/Throat: Oropharynx is clear and moist. No oropharyngeal exudate.  Eyes: Conjunctivae and EOM are normal. Pupils are equal, round, and reactive to light. No scleral icterus.  Neck: Normal range of motion. No tracheal deviation present.  Cardiovascular: Normal rate and  intact distal pulses.   Pulmonary/Chest: Effort normal. No respiratory distress. She exhibits no tenderness.  Abdominal: Soft. She exhibits no distension. There is no tenderness. Hernia confirmed negative in the right inguinal area and confirmed negative in the left inguinal area.  Incisions clean with normal healing ridges.  No hernias  Genitourinary: No vaginal discharge found.  Exam done with assistance of female Medical Assistant in the room.  Perianal skin clean with good hygiene.  No pruritis.  No pilonidal disease.  No fissure.  No abscess/fistula.    Small Rant & R post external skin tags.  Normal sphincter tone.    Musculoskeletal: Normal range of motion. She exhibits no tenderness.  Lymphadenopathy:       Right: No inguinal adenopathy present.       Left: No inguinal adenopathy present.  Neurological: She is alert and oriented to person, place, and time. No cranial nerve deficit. She exhibits normal muscle tone. Coordination normal.  Skin: Skin is warm and dry. No rash noted. She is not diaphoretic.  Psychiatric: She has a normal mood and affect. Her behavior is normal.       Assessment:     Struggling with poor pain control.  Noncompliance with bowel regimen or pain regimen.     Plan:     I apologize that she had been struggling.  Again however, I stressed to her that I cannot help her if I do not know what is going on.  I know that she has issues, I work with her to help improve them.  I renewed the oxycodone.  I recommend that she take Tylenol round-the-clock.  She was afraid D., would hurt her pancreas.  I told her it would not.  She has alcohol issues in the past, I see no evidence of any cirrhosis that would be a contraindication.  She cannot take nonsteroidals being on Plavix.  I recommended warm soaks in the bath.  She seemed a little skeptical of my recs, but after explaining it many more times, she seemed to understand the reasoning behind it.  Diet as  tolerated.  Low  fat high fiber diet ideal.  Bowel regimen with 30 g fiber a day and fiber supplement as needed to avoid problems.  I again reminded her to take a fiber supplement to avoid severe constipation and diarrhea as we did discuss many times before.  She confessed that she forgot promised to start back up on that again  Return to clinic in a few weeks, sooner as needed.   Instructions discussed.  Followup with primary care physician for other health issues as would normally be done.  Questions answered.  I stressed to her to call me to give me feedback so we can adjust the plan if needed. The patient expressed understanding and appreciation

## 2013-10-23 NOTE — Patient Instructions (Signed)
HEMORRHOIDS  The rectum is the last foot of your colon, and it naturally stretches to hold stool.  Hemorrhoidal piles are natural clusters of blood vessels that help the rectum and anal canal stretch to hold stool and allow bowel movements to eliminate feces.   Hemorrhoids are abnormally swollen blood vessels in the rectum.  Too much pressure in the rectum causes hemorrhoids by forcing blood to stretch and bulge the walls of the veins, sometimes even rupturing them.  Hemorrhoids can become like varicose veins you might see on a person's legs.  Most people will develop a flare of hemorrhoids in their lifetime.  When bulging hemorrhoidal veins are irritated, they can swell, burn, itch, cause pain, and bleed.  Most flares will calm down gradually own within a few weeks.  However, once hemorrhoids are created, they are difficult to get rid of completely and tend to flare more easily than the first flare.   Fortunately, good habits and simple medical treatment usually control hemorrhoids well, and surgery is needed only in severe cases. Types of Hemorrhoids:  Internal hemorrhoids usually don't initially hurt or itch; they are deep inside the rectum and usually have no sensation. If they begin to push out (prolapse), pain and burning can occur.  However, internal hemorrhoids can bleed.  Anal bleeding should not be ignored since bleeding could come from a dangerous source like colorectal cancer, so persistent rectal bleeding should be investigated by a doctor, sometimes with a colonoscopy.  External hemorrhoids cause most of the symptoms - pain, burning, and itching. Nonirritated hemorrhoids can look like small skin tags coming out of the anus.   Thrombosed hemorrhoids can form when a hemorrhoid blood vessel bursts and causes the hemorrhoid to suddenly swell.  A purple blood clot can form in it and become an excruciatingly painful lump at the anus. Because of these unpleasant symptoms, immediate incision and  drainage by a surgeon at an office visit can provide much relief of the pain.    PREVENTION Avoiding the most frequent causes listed below will prevent most cases of hemorrhoids: Constipation Hard stools Diarrhea  Constant sitting  Straining with bowel movements Sitting on the toilet for a long time  Severe coughing  episodes Pregnancy / Childbirth  Heavy Lifting  Sometimes avoiding the above triggers is difficult:  How can you avoid sitting all day if you have a seated job? Also, we try to avoid coughing and diarrhea, but sometimes it's beyond your control.  Still, there are some practical hints to help: Keep the anal and genital area clean.  Moistened tissues such as flushable wet wipes are less irritating than toilet paper.  Using irrigating showers or bottle irrigation washing gently cleans this sensitive area.   Avoid dry toilet paper when cleaning after bowel movements.  Marland Kitchen Keep the anal and genital area dry.  Lightly pat the rectal area dry.  Avoid rubbing.  Talcum or baby powders can help GET YOUR STOOLS SOFT.   This is the most important way to prevent irritated hemorrhoids.  Hard stools are like sandpaper to the anorectal canal and will cause more problems.  The goal: ONE SOFT BOWEL MOVEMENT A DAY!  BMs from every other day to 3 times a day is a tolerable range Treat coughing, diarrhea and constipation early since irritated hemorrhoids may soon follow.  If your main job activity is seated, always stand or walk during your breaks. Make it a point to stand and walk at least 5 minutes every hour  and try to shift frequently in your chair to avoid direct rectal pressure.  Always exhale as you strain or lift. Don't hold your breath.  Do not delay or try to prevent a bowel movement when the urge is present. Exercise regularly (walking or jogging 60 minutes a day) to stimulate the bowels to move. No reading or other activity while on the toilet. If bowel movements take longer than 5 minutes,  you are too constipated. AVOID CONSTIPATION Drink plenty of liquids (1 1/2 to 2 quarts of water and other fluids a day unless fluid restricted for another medical condition). Liquids that contain caffeine (coffee a, tea, soft drinks) can be dehydrating and should be avoided until constipation is controlled. Consider minimizing milk, as dairy products may be constipating. Eat plenty of fiber (30g a day ideal, more if needed).  Fiber is the undigested part of plant food that passes into the colon, acting as "natures broom" to encourage bowel motility and movement.  Fiber can absorb and hold large amounts of water. This results in a larger, bulkier stool, which is soft and easier to pass.  Eating foods high in fiber - 12 servings - such as  Vegetables: Root (potatoes, carrots, turnips), Leafy green (lettuce, salad greens, celery, spinach), High residue (cabbage, broccoli, etc.) Fruit: Fresh, Dried (prunes, apricots, cherries), Stewed (applesauce)  Whole grain breads, pasta, whole wheat Bran cereals, muffins, etc. Add a fiber supplement which retains large volumes of water: Psyllium ground seeds (native plant from [place])--available as Metamucil, Konsyl, Effersyllium, Per Diem Fiber, or the less expensive generic forms.  Citrucel  (methylcellulose wood fiber) . FiberCon (Polycarbophil) Polyethylene Glycol - and "artificial" fiber commonly called Miralax or Glycolax.  It is helpful for people with gassy or bloated feelings with regular fiber Flax Seed - a less gassy natural fiber  Laxatives can be useful for a short period if constipation is severe Osmotics (Milk of Magnesia, Fleets Phospho-Soda, Magnesium Citrate)  Stimulants (Senokot,   Castor Oil,  Dulcolax, Ex-Lax)    Laxatives are not a good long-term solution as it can stress the bowels and cause too much mineral loss and dehydration.   Avoid taking laxatives for more than 7 days in a row.  AVOID DIARRHEA Switch to liquids and simpler  foods for a few days to avoid stressing your intestines further. Avoid dairy products (especially milk & ice cream) for a short time.  The intestines often can lose the ability to digest lactose when stressed. Avoid foods that cause gassiness or bloating.  Typical foods include beans and other legumes, cabbage, broccoli, and dairy foods.  Every person has some sensitivity to other foods, so listen to your body and avoid those foods that trigger problems for you. Adding fiber (Citrucel, Metamucil, FiberCon, Flax seed, Miralax) gradually can help thicken stools by absorbing excess fluid and retrain the intestines to act more normally.  Slowly increase the dose over a few weeks.  Too much fiber too soon can backfire and cause cramping & bloating. Probiotics (such as active yogurt, Align, etc) may help repopulate the intestines and colon with normal bacteria and calm down a sensitive digestive tract.  Most studies show it to be of mild help, though, and such products can be costly. Medicines: Bismuth subsalicylate (ex. Kayopectate, Pepto Bismol) every 30 minutes for up to 6 doses can help control diarrhea.  Avoid if pregnant. Loperamide (Immodium) can slow down diarrhea.  Start with two tablets (4mg  total) first and then try one tablet every  6 hours.  Avoid if you are having fevers or severe pain.  If you are not better or start feeling worse, stop all medicines and call your doctor for advice Call your doctor if you are getting worse or not better.  Sometimes further testing (cultures, endoscopy, X-ray studies, bloodwork, etc) may be needed to help diagnose and treat the cause of the diarrhea. TREATMENT OF HEMORRHOID FLARE If these preventive measures fail, you must take action right away! Hemorrhoids are one condition that can be mild in the morning and become intolerable by nightfall. Most hemorrhoidal flares take several weeks to calm down.  These suggestions can help: Warm soaks.  This helps more than  any topical medication.  Use up to 8 times a day.  Usually sitz baths or sitting in a warm bathtub helps.  Sitting on moist warm towels are helpful.  Switching to ice packs/cool compresses can be helpful  Use a Sitz Bath 4-8 times a day for relief A sitz bath is a warm water bath taken in the sitting position that covers only the hips and buttocks. It may be used for either healing or hygiene purposes. Sitz baths are also used to relieve pain, itching, or muscle spasms. The water may contain medicine. Moist heat will help you heal and relax.  HOME CARE INSTRUCTIONS  Take 3 to 4 sitz baths a day. 1. Fill the bathtub half full with warm water. 2. Sit in the water and open the drain a little. 3. Turn on the warm water to keep the tub half full. Keep the water running constantly. 4. Soak in the water for 15 to 20 minutes. 5. After the sitz bath, pat the affected area dry first. SEEK MEDICAL CARE IF:  You get worse instead of better. Stop the sitz baths if you get worse.  Normalize your bowels.  Extremes of diarrhea or constipation will make hemorrhoids worse.  One soft bowel movement a day is the goal.  Fiber can help get your bowels regular Wet wipes instead of toilet paper Pain control with a NSAID such as ibuprofen (Advil) or naproxen (Aleve) or acetaminophen (Tylenol) around the clock.  Narcotics are constipating and should be minimized if possible Topical creams contain steroids (bydrocortisone) or local anesthetic (xylocaine) can help make pain and itching more tolerable.   EVALUATION If hemorrhoids are still causing problems, you could benefit by an evaluation by a surgeon.  The surgeon will obtain a history and examine you.  If hemorrhoids are diagnosed, some therapies can be offered in the office, usually with an anoscope into the less sensitive area of the rectum: -injection of hemorrhoids (sclerotherapy) can scar the blood vessels of the swollen/enlarged hemorrhoids to help shrink them  down to a more normal size -rubber banding of the enlarged hemorrhoids to help shrink them down to a more normal size -drainage of the blood clot causing a thrombosed hemorrhoid,  to relieve the severe pain   While 90% of the time such problems from hemorrhoids can be managed without preceding to surgery, sometimes the hemorrhoids require a operation to control the problem (uncontrolled bleeding, prolapse, pain, etc.).   This involves being placed under general anesthesia where the surgeon can confirm the diagnosis and remove, suture, or staple the hemorrhoid(s).  Your surgeon can help you treat the problem appropriately.    GETTING TO GOOD BOWEL HEALTH. Irregular bowel habits such as constipation and diarrhea can lead to many problems over time.  Having one soft bowel movement a  day is the most important way to prevent further problems.  The anorectal canal is designed to handle stretching and feces to safely manage our ability to get rid of solid waste (feces, poop, stool) out of our body.  BUT, hard constipated stools can act like ripping concrete bricks and diarrhea can be a burning fire to this very sensitive area of our body, causing inflamed hemorrhoids, anal fissures, increasing risk is perirectal abscesses, abdominal pain/bloating, an making irritable bowel worse.     The goal: ONE SOFT BOWEL MOVEMENT A DAY!  To have soft, regular bowel movements:    Drink at least 8 tall glasses of water a day.     Take plenty of fiber.  Fiber is the undigested part of plant food that passes into the colon, acting s "natures broom" to encourage bowel motility and movement.  Fiber can absorb and hold large amounts of water. This results in a larger, bulkier stool, which is soft and easier to pass. Work gradually over several weeks up to 6 servings a day of fiber (25g a day even more if needed) in the form of: o Vegetables -- Root (potatoes, carrots, turnips), leafy green (lettuce, salad greens, celery, spinach),  or cooked high residue (cabbage, broccoli, etc) o Fruit -- Fresh (unpeeled skin & pulp), Dried (prunes, apricots, cherries, etc ),  or stewed ( applesauce)  o Whole grain breads, pasta, etc (whole wheat)  o Bran cereals    Bulking Agents -- This type of water-retaining fiber generally is easily obtained each day by one of the following:  o Psyllium bran -- The psyllium plant is remarkable because its ground seeds can retain so much water. This product is available as Metamucil, Konsyl, Effersyllium, Per Diem Fiber, or the less expensive generic preparation in drug and health food stores. Although labeled a laxative, it really is not a laxative.  o Methylcellulose -- This is another fiber derived from wood which also retains water. It is available as Citrucel. o Polyethylene Glycol - and "artificial" fiber commonly called Miralax or Glycolax.  It is helpful for people with gassy or bloated feelings with regular fiber o Flax Seed - a less gassy fiber than psyllium   No reading or other relaxing activity while on the toilet. If bowel movements take longer than 5 minutes, you are too constipated   AVOID CONSTIPATION.  High fiber and water intake usually takes care of this.  Sometimes a laxative is needed to stimulate more frequent bowel movements, but    Laxatives are not a good long-term solution as it can wear the colon out. o Osmotics (Milk of Magnesia, Fleets phosphosoda, Magnesium citrate, MiraLax, GoLytely) are safer than  o Stimulants (Senokot, Castor Oil, Dulcolax, Ex Lax)    o Do not take laxatives for more than 7days in a row.    IF SEVERELY CONSTIPATED, try a Bowel Retraining Program: o Do not use laxatives.  o Eat a diet high in roughage, such as bran cereals and leafy vegetables.  o Drink six (6) ounces of prune or apricot juice each morning.  o Eat two (2) large servings of stewed fruit each day.  o Take one (1) heaping tablespoon of a psyllium-based bulking agent twice a day. Use  sugar-free sweetener when possible to avoid excessive calories.  o Eat a normal breakfast.  o Set aside 15 minutes after breakfast to sit on the toilet, but do not strain to have a bowel movement.  o If you do not  have a bowel movement by the third day, use an enema and repeat the above steps.    Controlling diarrhea o Switch to liquids and simpler foods for a few days to avoid stressing your intestines further. o Avoid dairy products (especially milk & ice cream) for a short time.  The intestines often can lose the ability to digest lactose when stressed. o Avoid foods that cause gassiness or bloating.  Typical foods include beans and other legumes, cabbage, broccoli, and dairy foods.  Every person has some sensitivity to other foods, so listen to our body and avoid those foods that trigger problems for you. o Adding fiber (Citrucel, Metamucil, psyllium, Miralax) gradually can help thicken stools by absorbing excess fluid and retrain the intestines to act more normally.  Slowly increase the dose over a few weeks.  Too much fiber too soon can backfire and cause cramping & bloating. o Probiotics (such as active yogurt, Align, etc) may help repopulate the intestines and colon with normal bacteria and calm down a sensitive digestive tract.  Most studies show it to be of mild help, though, and such products can be costly. o Medicines:   Bismuth subsalicylate (ex. Kayopectate, Pepto Bismol) every 30 minutes for up to 6 doses can help control diarrhea.  Avoid if pregnant.   Loperamide (Immodium) can slow down diarrhea.  Start with two tablets (4mg  total) first and then try one tablet every 6 hours.  Avoid if you are having fevers or severe pain.  If you are not better or start feeling worse, stop all medicines and call your doctor for advice o Call your doctor if you are getting worse or not better.  Sometimes further testing (cultures, endoscopy, X-ray studies, bloodwork, etc) may be needed to help diagnose  and treat the cause of the diarrhea. o

## 2013-10-25 ENCOUNTER — Other Ambulatory Visit: Payer: Self-pay | Admitting: Internal Medicine

## 2013-11-07 ENCOUNTER — Telehealth: Payer: Self-pay | Admitting: Internal Medicine

## 2013-11-07 MED ORDER — PANCRELIPASE (LIP-PROT-AMYL) 24000-76000 UNITS PO CPEP: 3.0000 | ORAL_CAPSULE | Freq: Three times a day (TID) | ORAL | Status: DC

## 2013-11-07 MED ORDER — OXYCODONE HCL 5 MG PO TABS: 5.0000 mg | ORAL_TABLET | Freq: Four times a day (QID) | ORAL | Status: DC | PRN

## 2013-11-07 NOTE — Telephone Encounter (Signed)
Please let me know if I may refill these for her, thank you Sir.

## 2013-11-07 NOTE — Telephone Encounter (Signed)
Creon for 1 year Oxycodone for 1 month

## 2013-11-07 NOTE — Telephone Encounter (Signed)
Spoke to patient and told her the oxy rx was ready for pick up.  Creon sent in.

## 2013-11-20 ENCOUNTER — Other Ambulatory Visit (HOSPITAL_COMMUNITY): Payer: Self-pay | Admitting: Internal Medicine

## 2013-11-20 DIAGNOSIS — Z1231 Encounter for screening mammogram for malignant neoplasm of breast: Secondary | ICD-10-CM

## 2013-12-03 ENCOUNTER — Ambulatory Visit (HOSPITAL_COMMUNITY): Payer: Medicaid Other

## 2013-12-06 ENCOUNTER — Ambulatory Visit (HOSPITAL_COMMUNITY)
Admission: RE | Admit: 2013-12-06 | Discharge: 2013-12-06 | Disposition: A | Discharge status: Home / Self Care | Payer: Medicare Other | Source: Ambulatory Visit | Attending: Internal Medicine | Admitting: Internal Medicine

## 2013-12-06 DIAGNOSIS — Z1231 Encounter for screening mammogram for malignant neoplasm of breast: Secondary | ICD-10-CM | POA: Insufficient documentation

## 2014-01-15 ENCOUNTER — Telehealth: Payer: Self-pay | Admitting: Internal Medicine

## 2014-01-15 MED ORDER — OXYCODONE HCL 5 MG PO TABS: 5.0000 mg | ORAL_TABLET | Freq: Four times a day (QID) | ORAL | Status: DC | PRN

## 2014-01-15 NOTE — Telephone Encounter (Signed)
Yes Not sure when I can sign it - perhaps tomorrow

## 2014-01-15 NOTE — Telephone Encounter (Signed)
Informed patient that we will refill her rx but that Dr. Carlean Purl is at the hospital so I will call her when he signs it.  We hope that will be tomorrow.

## 2014-01-15 NOTE — Telephone Encounter (Signed)
Please advise if ok Sir.

## 2014-01-16 NOTE — Telephone Encounter (Signed)
Oxy rx signed, pt informed and put up front for pick up.

## 2014-01-21 ENCOUNTER — Other Ambulatory Visit: Payer: Self-pay

## 2014-01-21 MED ORDER — OMEPRAZOLE 40 MG PO CPDR: 40.0000 mg | DELAYED_RELEASE_CAPSULE | Freq: Every day | ORAL | Status: DC

## 2014-01-22 ENCOUNTER — Other Ambulatory Visit: Payer: Self-pay

## 2014-01-22 MED ORDER — METOPROLOL TARTRATE 25 MG PO TABS: 12.5000 mg | ORAL_TABLET | Freq: Two times a day (BID) | ORAL | Status: DC

## 2014-01-22 NOTE — Telephone Encounter (Signed)
Rx was sent to pharmacy electronically. 

## 2014-01-24 ENCOUNTER — Other Ambulatory Visit: Payer: Self-pay | Admitting: *Deleted

## 2014-01-27 ENCOUNTER — Other Ambulatory Visit: Payer: Self-pay

## 2014-01-27 MED ORDER — METOPROLOL TARTRATE 25 MG PO TABS: 12.5000 mg | ORAL_TABLET | Freq: Two times a day (BID) | ORAL | Status: DC

## 2014-01-27 NOTE — Telephone Encounter (Signed)
Rx was sent to pharmacy electronically. 

## 2014-02-19 ENCOUNTER — Telehealth: Payer: Self-pay | Admitting: Internal Medicine

## 2014-02-19 DIAGNOSIS — Z0283 Encounter for blood-alcohol and blood-drug test: Secondary | ICD-10-CM

## 2014-02-19 MED ORDER — OXYCODONE HCL 5 MG PO TABS: 5.0000 mg | ORAL_TABLET | Freq: Four times a day (QID) | ORAL | Status: DC | PRN

## 2014-02-19 NOTE — Telephone Encounter (Signed)
Ok to refill Sir? 

## 2014-02-19 NOTE — Telephone Encounter (Signed)
Spoke to patient and she will come by Friday and do urine drug screen.  Once it's been collected I will give her signed oxy rx. I will have Dr. Carlean Purl sign it tomorrow.

## 2014-02-19 NOTE — Telephone Encounter (Signed)
OK Please have her do a urine drug screen for also Am at [facility] tomorrow AM

## 2014-02-19 NOTE — Telephone Encounter (Signed)
No - needs to be collected

## 2014-02-19 NOTE — Telephone Encounter (Signed)
Do we have to get results before giving rx to her Sir?

## 2014-02-27 ENCOUNTER — Other Ambulatory Visit: Payer: Medicare Other

## 2014-02-27 DIAGNOSIS — Z0283 Encounter for blood-alcohol and blood-drug test: Secondary | ICD-10-CM

## 2014-02-28 LAB — DRUG SCREEN, URINE
Amphetamine Screen, Ur: NEGATIVE
BARBITURATE QUANT UR: NEGATIVE
Benzodiazepines.: NEGATIVE
Cocaine Metabolites: NEGATIVE
Creatinine,U: 406.26 mg/dL
METHADONE: NEGATIVE
Marijuana Metabolite: NEGATIVE
Opiates: NEGATIVE
PROPOXYPHENE: NEGATIVE
Phencyclidine (PCP): NEGATIVE

## 2014-02-28 NOTE — Progress Notes (Signed)
Quick Note:  No opiates in urine - should be if using the meds Maybe taking prn  Please have her come in to see me before next Rx due ______

## 2014-03-25 ENCOUNTER — Telehealth: Payer: Self-pay | Admitting: Internal Medicine

## 2014-03-25 ENCOUNTER — Ambulatory Visit: Payer: Medicare Other | Admitting: Internal Medicine

## 2014-03-25 NOTE — Telephone Encounter (Signed)
No charge per Dr. Gessner. 

## 2014-05-08 ENCOUNTER — Encounter: Payer: Self-pay | Admitting: Gastroenterology

## 2014-05-26 ENCOUNTER — Encounter: Payer: Self-pay | Admitting: Internal Medicine

## 2014-05-26 ENCOUNTER — Ambulatory Visit (INDEPENDENT_AMBULATORY_CARE_PROVIDER_SITE_OTHER): Payer: Medicare Other | Admitting: Internal Medicine

## 2014-05-26 VITALS — BP 142/90 | HR 74 | Ht 62.0 in | Wt 169.6 lb

## 2014-05-26 DIAGNOSIS — K861 Other chronic pancreatitis: Secondary | ICD-10-CM

## 2014-05-26 DIAGNOSIS — K860 Alcohol-induced chronic pancreatitis: Secondary | ICD-10-CM

## 2014-05-26 DIAGNOSIS — G8929 Other chronic pain: Secondary | ICD-10-CM

## 2014-05-26 DIAGNOSIS — R1013 Epigastric pain: Secondary | ICD-10-CM

## 2014-05-26 DIAGNOSIS — K529 Noninfective gastroenteritis and colitis, unspecified: Secondary | ICD-10-CM

## 2014-05-26 DIAGNOSIS — R197 Diarrhea, unspecified: Secondary | ICD-10-CM

## 2014-05-26 MED ORDER — OXYCODONE HCL 5 MG PO TABS: 5.0000 mg | ORAL_TABLET | Freq: Four times a day (QID) | ORAL | Status: DC | PRN

## 2014-05-26 NOTE — Patient Instructions (Signed)
I have refilled your oxycodone. Call when you need more. You will be asked to submit urine tests at times again - to see if you are taking the medication.  I appreciate the opportunity to care for you. Gatha Mayer, MD, [facility]

## 2014-05-26 NOTE — Assessment & Plan Note (Signed)
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

## 2014-05-26 NOTE — Assessment & Plan Note (Signed)
controlled 

## 2014-05-26 NOTE — Progress Notes (Signed)
Subjective:    Patient ID: [name] Foley, female    DOB: 12-06-1945, 68 y.o.   MRN: [id number]  HPI Records she is having some intermittent epigastric pain off and on like she usually does. She is out of her narcotics. We had a discussion about the negative urine test in that it did not show opiates. She says she goes for weeks without taking and she says she is not selling her medications are given and anybody else nor is anybody taking him from her.  Allergies  Allergen Reactions  . Diclofenac Sodium     REACTION: Chronic renal insufficiency.  . Other Other (See Comments)    Blackeyed peas and Pinto Beans...break out in hives   Outpatient Prescriptions Prior to Visit  Medication Sig Dispense Refill  . AMBULATORY NON FORMULARY MEDICATION Nicotine patch 14mg  use 1 patch every 24 hrs transdermal      . aspirin EC 81 MG EC tablet Take 1 tablet (81 mg total) by mouth daily.      Marland Kitchen atorvastatin (LIPITOR) 40 MG tablet TAKE ONE (1) TABLET EACH DAY AT 6PM  30 tablet  5  . clonazePAM (KLONOPIN) 1 MG tablet Take 1 tablet (1 mg total) by mouth at bedtime.  30 tablet  0  . clopidogrel (PLAVIX) 75 MG tablet Take 75 mg by mouth daily.        . metFORMIN (GLUCOPHAGE) 500 MG tablet Take 500 mg by mouth daily.        . metoCLOPramide (REGLAN) 10 MG tablet Take 1 tablet (10 mg total) by mouth every 6 (six) hours as needed (nausea).  20 tablet  0  . metoprolol tartrate (LOPRESSOR) 25 MG tablet Take 0.5 tablets (12.5 mg total) by mouth 2 (two) times daily.  90 tablet  0  . mirtazapine (REMERON) 15 MG tablet Take 1 tablet (15 mg total) by mouth at bedtime.  30 tablet  0  . nicotine (NICODERM CQ) 14 mg/24hr patch Place 1 patch onto the skin daily.  28 patch  0  . nitroGLYCERIN (NITROSTAT) 0.4 MG SL tablet Place 1 tablet (0.4 mg total) under the tongue every 5 (five) minutes x 3 doses as needed for chest pain.  25 tablet  4  . omeprazole (PRILOSEC) 40 MG capsule Take 1 capsule (40 mg total) by mouth  daily.  90 capsule  0  . Pancrelipase, Lip-Prot-Amyl, 24000 UNITS CPEP Take 3 capsules (72,000 Units total) by mouth 3 (three) times daily. 3 capsules with meals and 1 with snacks  180 capsule  11  . QUEtiapine (SEROQUEL) 25 MG tablet Take 25 mg by mouth at bedtime.      Marland Kitchen oxyCODONE (OXY IR/ROXICODONE) 5 MG immediate release tablet Take 1-2 tablets (5-10 mg total) by mouth every 6 (six) hours as needed for moderate pain, severe pain or breakthrough pain.  40 tablet  0  . megestrol (MEGACE) 40 MG/ML suspension Take 400 mg by mouth daily.       No facility-administered medications prior to visit.   Past Medical History  Diagnosis Date  . Diabetes mellitus   . 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 Coronary stent occlusion   . GERD (gastroesophageal reflux disease)   . Hypertension     no medication currently B/P has been ok  . Pneumonia     12/02/11 pneumonia  . Stented coronary artery   . Tobacco dependence   . Alcohol abuse  abstinent since 2009  . Insomnia   . Psoriasis   . Hemorrhoids   . Colon adenoma 04/09/2012    diminutive cecal  . Coronary artery disease   . HLD (hyperlipidemia)   . History of MI (myocardial infarction)   . Hiatal hernia   . Common bile duct stricture   . GERD (gastroesophageal reflux disease)   . Hyperlipidemia   . Depression   . Fatty liver   . Hypokalemia    Past Surgical History  Procedure Laterality Date  . Cholecystectomy  1976  . Abdominal hysterectomy  1994  . Carotid stent insertion  2006/2013  . Eus  01/05/2012    Procedure: UPPER ENDOSCOPIC ULTRASOUND (EUS) LINEAR;  Surgeon: Milus Banister, MD;  Location: [facility];  Service: Endoscopy;  Laterality: N/A;  radial linear  . Colonoscopy      multiple  . Ercp  07/25/2012    Procedure: ENDOSCOPIC RETROGRADE CHOLANGIOPANCREATOGRAPHY (ERCP);  Surgeon: Gatha Mayer, MD;  Location: [facility];  Service: Endoscopy;  Laterality: N/A;  with stent  .  Esophagogastroduodenoscopy  10/25/2012    Procedure: ESOPHAGOGASTRODUODENOSCOPY (EGD);  Surgeon: Gatha Mayer, MD;  Location: [facility];  Service: Endoscopy;  Laterality: N/A;  EGD with stent removal using ERCP scope  . Hemorrhoid surgery  09/17/2013    [place] hem ligation/pexy   Review of Systems As per history of present illness    Objective:   Physical Exam General:  NAD Eyes:   anicteric Lungs:  clear Heart:  S1S2 no rubs, murmurs or gallops Abdomen:  soft and mildly tender in epigastrium, BS+ Ext:   no edema      Assessment & Plan:   PANCREATITIS, CHRONIC  Controlled on enzyme replacement  Chronic diarrhea - ? steatorrhea vs. IBS or both controlled  Abdominal pain, chronic, epigastric - from chronic pancreatitis Continue as needed oxycodone 5 mg. Note that when she was here for refill in the past her urine test did not show evidence of opiates but she says she can go for weeks without using. I plan to retest her at some point in the future and she is aware of that. Will refill the oxycodone #40 today. Return to the office in 6 months sooner as needed.   Current outpatient prescriptions:AMBULATORY NON FORMULARY MEDICATION, Nicotine patch 14mg  use 1 patch every 24 hrs transdermal, Disp: , Rfl: ;  aspirin EC 81 MG EC tablet, Take 1 tablet (81 mg total) by mouth daily., Disp: , Rfl: ;  atorvastatin (LIPITOR) 40 MG tablet, TAKE ONE (1) TABLET EACH DAY AT 6PM, Disp: 30 tablet, Rfl: 5;  clonazePAM (KLONOPIN) 1 MG tablet, Take 1 tablet (1 mg total) by mouth at bedtime., Disp: 30 tablet, Rfl: 0 clopidogrel (PLAVIX) 75 MG tablet, Take 75 mg by mouth daily.  , Disp: , Rfl: ;  metFORMIN (GLUCOPHAGE) 500 MG tablet, Take 500 mg by mouth daily.  , Disp: , Rfl: ;  metoCLOPramide (REGLAN) 10 MG tablet, Take 1 tablet (10 mg total) by mouth every 6 (six) hours as needed (nausea)., Disp: 20 tablet, Rfl: 0;  metoprolol tartrate (LOPRESSOR) 25 MG tablet, Take 0.5 tablets (12.5 mg total) by mouth 2  (two) times daily., Disp: 90 tablet, Rfl: 0 mirtazapine (REMERON) 15 MG tablet, Take 1 tablet (15 mg total) by mouth at bedtime., Disp: 30 tablet, Rfl: 0;  nicotine (NICODERM CQ) 14 mg/24hr patch, Place 1 patch onto the skin daily., Disp: 28 patch, Rfl: 0;  nitroGLYCERIN (NITROSTAT) 0.4 MG SL tablet,  Place 1 tablet (0.4 mg total) under the tongue every 5 (five) minutes x 3 doses as needed for chest pain., Disp: 25 tablet, Rfl: 4 omeprazole (PRILOSEC) 40 MG capsule, Take 1 capsule (40 mg total) by mouth daily., Disp: 90 capsule, Rfl: 0;  oxyCODONE (OXY IR/ROXICODONE) 5 MG immediate release tablet, Take 1-2 tablets (5-10 mg total) by mouth every 6 (six) hours as needed for moderate pain, severe pain or breakthrough pain., Disp: 40 tablet, Rfl: 0 Pancrelipase, Lip-Prot-Amyl, 24000 UNITS CPEP, Take 3 capsules (72,000 Units total) by mouth 3 (three) times daily. 3 capsules with meals and 1 with snacks, Disp: 180 capsule, Rfl: 11;  QUEtiapine (SEROQUEL) 25 MG tablet, Take 25 mg by mouth at bedtime., Disp: , Rfl:    Cc:Philis Fendt, MD

## 2014-05-26 NOTE — Assessment & Plan Note (Signed)
Continue as needed oxycodone 5 mg. Note that when she was here for refill in the past her urine test did not show evidence of opiates but she says she can go for weeks without using. I plan to retest her at some point in the future and she is aware of that. Will refill the oxycodone #40 today. Return to the office in 6 months sooner as needed.

## 2014-07-02 ENCOUNTER — Telehealth: Payer: Self-pay | Admitting: Internal Medicine

## 2014-07-02 DIAGNOSIS — G8929 Other chronic pain: Secondary | ICD-10-CM

## 2014-07-02 DIAGNOSIS — K860 Alcohol-induced chronic pancreatitis: Secondary | ICD-10-CM

## 2014-07-02 DIAGNOSIS — R1013 Epigastric pain: Secondary | ICD-10-CM

## 2014-07-02 MED ORDER — OXYCODONE HCL 5 MG PO TABS: 5.0000 mg | ORAL_TABLET | Freq: Four times a day (QID) | ORAL | Status: DC | PRN

## 2014-07-02 NOTE — Telephone Encounter (Signed)
Prescription printed and patient notified to come by office and pick it up. Pt will come by tomorrow.

## 2014-07-02 NOTE — Telephone Encounter (Signed)
Yes

## 2014-07-02 NOTE — Telephone Encounter (Signed)
Ok to refill Dr. Gessner? 

## 2014-08-01 ENCOUNTER — Telehealth: Payer: Self-pay | Admitting: Internal Medicine

## 2014-08-01 DIAGNOSIS — G8929 Other chronic pain: Secondary | ICD-10-CM

## 2014-08-01 DIAGNOSIS — R1013 Epigastric pain: Secondary | ICD-10-CM

## 2014-08-01 DIAGNOSIS — K860 Alcohol-induced chronic pancreatitis: Secondary | ICD-10-CM

## 2014-08-01 NOTE — Telephone Encounter (Signed)
Can we refill Sir?

## 2014-08-04 MED ORDER — OXYCODONE HCL 5 MG PO TABS: 5.0000 mg | ORAL_TABLET | Freq: Four times a day (QID) | ORAL | Status: DC | PRN

## 2014-08-04 NOTE — Telephone Encounter (Signed)
Ok to refill 

## 2014-08-04 NOTE — Telephone Encounter (Signed)
rx out front.  I spoke with her and notified her to pick it up

## 2014-08-04 NOTE — Telephone Encounter (Signed)
Dr. Carlean Purl are you willing to refill?

## 2014-09-02 ENCOUNTER — Other Ambulatory Visit: Payer: Self-pay | Admitting: Internal Medicine

## 2014-09-02 ENCOUNTER — Telehealth: Payer: Self-pay | Admitting: Internal Medicine

## 2014-09-02 DIAGNOSIS — K860 Alcohol-induced chronic pancreatitis: Secondary | ICD-10-CM

## 2014-09-02 DIAGNOSIS — F1190 Opioid use, unspecified, uncomplicated: Secondary | ICD-10-CM

## 2014-09-02 DIAGNOSIS — R1013 Epigastric pain: Secondary | ICD-10-CM

## 2014-09-02 DIAGNOSIS — G8929 Other chronic pain: Secondary | ICD-10-CM

## 2014-09-02 MED ORDER — OXYCODONE HCL 5 MG PO TABS: 5.0000 mg | ORAL_TABLET | Freq: Four times a day (QID) | ORAL | Status: DC | PRN

## 2014-09-02 NOTE — Telephone Encounter (Signed)
May we refill? 

## 2014-09-02 NOTE — Telephone Encounter (Signed)
Spoke with patient and informed her to come for drug screen and the oxy rx.

## 2014-09-02 NOTE — Telephone Encounter (Signed)
Needs urine drug screen and may refill

## 2014-09-03 ENCOUNTER — Other Ambulatory Visit: Payer: Medicare Other

## 2014-09-03 DIAGNOSIS — F1190 Opioid use, unspecified, uncomplicated: Secondary | ICD-10-CM

## 2014-09-04 LAB — DRUG SCREEN, URINE
AMPHETAMINE SCRN UR: NEGATIVE
BARBITURATE QUANT UR: NEGATIVE
Benzodiazepines.: POSITIVE — AB
COCAINE METABOLITES: NEGATIVE
CREATININE, U: 580.73 mg/dL
MARIJUANA METABOLITE: NEGATIVE
Methadone: NEGATIVE
Opiates: NEGATIVE
PHENCYCLIDINE (PCP): NEGATIVE
Propoxyphene: NEGATIVE

## 2014-09-05 NOTE — Progress Notes (Signed)
Quick Note:  Have her repeat drug screen next week - ______

## 2014-09-10 ENCOUNTER — Other Ambulatory Visit: Payer: Medicare Other

## 2014-09-10 ENCOUNTER — Other Ambulatory Visit: Payer: Self-pay | Admitting: Internal Medicine

## 2014-09-10 DIAGNOSIS — F1190 Opioid use, unspecified, uncomplicated: Secondary | ICD-10-CM

## 2014-09-11 LAB — DRUG SCREEN, URINE
Amphetamine Screen, Ur: NEGATIVE
BARBITURATE QUANT UR: NEGATIVE
Benzodiazepines.: NEGATIVE
COCAINE METABOLITES: NEGATIVE
Creatinine,U: 166.74 mg/dL
MARIJUANA METABOLITE: NEGATIVE
METHADONE: NEGATIVE
OPIATES: NEGATIVE
Phencyclidine (PCP): NEGATIVE
Propoxyphene: NEGATIVE

## 2014-09-11 NOTE — Progress Notes (Signed)
Quick Note:  Let her know no opiates on these two drug screens - I must assume she is not taking the narcotics I prescribed I am discharging her from the practice because of this I will creat letter ______

## 2014-09-15 NOTE — Progress Notes (Signed)
Quick Note:  OK - Good work ______

## 2014-09-16 ENCOUNTER — Telehealth: Payer: Self-pay | Admitting: Cardiovascular Disease

## 2014-09-16 LAB — OXYCODONE W/CONF

## 2014-09-17 ENCOUNTER — Encounter: Payer: Self-pay | Admitting: *Deleted

## 2014-09-17 NOTE — Telephone Encounter (Signed)
Close encounter 

## 2014-09-19 LAB — OXYCODONE, URINE (LC/MS-MS)
NOROXYCODONE, UR: 10151 ng/mL — AB (ref <50)
Oxycodone, ur: 1490 ng/mL — ABNORMAL HIGH (ref <50)
Oxymorphone: 942 ng/mL — ABNORMAL HIGH (ref <50)

## 2014-09-23 ENCOUNTER — Encounter: Payer: Self-pay | Admitting: Cardiovascular Disease

## 2014-09-23 ENCOUNTER — Ambulatory Visit (INDEPENDENT_AMBULATORY_CARE_PROVIDER_SITE_OTHER): Payer: Medicare Other | Admitting: Cardiovascular Disease

## 2014-09-23 VITALS — BP 142/84 | HR 82 | Ht 62.0 in | Wt 155.0 lb

## 2014-09-23 DIAGNOSIS — I739 Peripheral vascular disease, unspecified: Secondary | ICD-10-CM

## 2014-09-23 DIAGNOSIS — I10 Essential (primary) hypertension: Secondary | ICD-10-CM

## 2014-09-23 DIAGNOSIS — I2510 Atherosclerotic heart disease of native coronary artery without angina pectoris: Secondary | ICD-10-CM

## 2014-09-23 NOTE — Progress Notes (Signed)
09/23/2014 [place]   11/05/1945  [id number]  Primary Physician Philis Fendt, MD Primary Cardiologist: Lorretta Harp MD Renae Gloss   HPI:  The patient is a very pleasant 68 -year-old mildly overweight single [place] American female with no children who I last saw in the office 18 months ago. She has a history of CAD status post proximal LAD stenting by Dr. Rollene Fare in the past with 2 overlapping stents. 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 Risk factors include continued tobacco abuse, hypertension, diabetes and hyperlipidemia. She has had chronic pancreatitis with discontinued tobacco abuse in the past, followed by Dr. Carlean Purl. She presented with an acute anterior wall myocardial infarction (STEMI) on October 19 at 12:30 in the morning and underwent urgent angiography by Dr. Peter Martinique via the right radial approach. Her proximal LAD was occluded and he placed a 4.0 x 15 mm long Promus drug-eluting stent with excellent result. Her circumflex and RCA had minimal disease and an EF of 55%. She has had no recurrent symptoms on aspirin and Plavix. Her primary care doctor follows her lipid profile closely. .Since I saw her last she denies chest pain or shortness of breath.   Current Outpatient Prescriptions  Medication Sig Dispense Refill  . AMBULATORY NON FORMULARY MEDICATION Nicotine patch 14mg  use 1 patch every 24 hrs transdermal    . aspirin EC 81 MG EC tablet Take 1 tablet (81 mg total) by mouth daily.    Marland Kitchen atorvastatin (LIPITOR) 40 MG tablet TAKE ONE (1) TABLET EACH DAY AT 6PM 30 tablet 5  . clonazePAM (KLONOPIN) 1 MG tablet Take 1 tablet (1 mg total) by mouth at bedtime. 30 tablet 0  . clopidogrel (PLAVIX) 75 MG tablet Take 75 mg by mouth daily.      . meloxicam (MOBIC) 7.5 MG tablet   5  . metFORMIN (GLUCOPHAGE) 500 MG tablet Take 500 mg by mouth daily.      . metoCLOPramide (REGLAN) 10 MG tablet Take 1 tablet (10 mg total)  by mouth every 6 (six) hours as needed (nausea). 20 tablet 0  . metoprolol tartrate (LOPRESSOR) 25 MG tablet Take 0.5 tablets (12.5 mg total) by mouth 2 (two) times daily. 90 tablet 0  . mirtazapine (REMERON) 15 MG tablet Take 1 tablet (15 mg total) by mouth at bedtime. 30 tablet 0  . nicotine (NICODERM CQ) 14 mg/24hr patch Place 1 patch onto the skin daily. 28 patch 0  . nitroGLYCERIN (NITROSTAT) 0.4 MG SL tablet Place 1 tablet (0.4 mg total) under the tongue every 5 (five) minutes x 3 doses as needed for chest pain. 25 tablet 4  . omeprazole (PRILOSEC) 40 MG capsule TAKE ONE CAPSULE BY MOUTH DAILY 90 capsule 0  . oxyCODONE (OXY IR/ROXICODONE) 5 MG immediate release tablet Take 1-2 tablets (5-10 mg total) by mouth every 6 (six) hours as needed for moderate pain, severe pain or breakthrough pain. 40 tablet 0  . Pancrelipase, Lip-Prot-Amyl, 24000 UNITS CPEP Take 3 capsules (72,000 Units total) by mouth 3 (three) times daily. 3 capsules with meals and 1 with snacks 180 capsule 11  . QUEtiapine (SEROQUEL) 25 MG tablet Take 25 mg by mouth at bedtime.    . Vitamin D, Ergocalciferol, (DRISDOL) 50000 UNITS CAPS capsule   5   No current facility-administered medications for this visit.    Allergies  Allergen Reactions  . Diclofenac Sodium     REACTION: Chronic renal insufficiency.  . Other  Other (See Comments)    Blackeyed peas and Pinto Beans...break out in hives    History   Social History  . Marital Status: Single    Spouse Name: N/A    Number of Children: 0  . Years of Education: N/A   Occupational History  . RETIRED    Social History Main Topics  . Smoking status: Former Smoker -- 0.50 packs/day for 15 years    Types: Cigarettes  . Smokeless tobacco: Never Used     Comment: 2 cigarettes a day  . 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 Drug Use: No  . Sexual Activity: No   Other Topics Concern  . Not on file   Social History Narrative     Review  of Systems: General: negative for chills, fever, night sweats or weight changes.  Cardiovascular: negative for chest pain, dyspnea on exertion, edema, orthopnea, palpitations, paroxysmal nocturnal dyspnea or shortness of breath Dermatological: negative for rash Respiratory: negative for cough or wheezing Urologic: negative for hematuria Abdominal: negative for nausea, vomiting, diarrhea, bright red blood per rectum, melena, or hematemesis Neurologic: negative for visual changes, syncope, or dizziness All other systems reviewed and are otherwise negative except as noted above.    Blood pressure 142/84, pulse 82, height 5\' 2"  (1.575 m), weight 155 lb (70.308 kg).  General appearance: alert and no distress Neck: no adenopathy, no carotid bruit, no JVD, supple, symmetrical, trachea midline and thyroid not enlarged, symmetric, no tenderness/mass/nodules Lungs: clear to auscultation bilaterally Heart: regular rate and rhythm, S1, S2 normal, no murmur, click, rub or gallop Extremities: extremities normal, atraumatic, no cyanosis or edema  EKG normal sinus rhythm at 82 without ST or T-wave changes. I personally reviewed this EKG  ASSESSMENT AND PLAN:   Coronary atherosclerosis History of CAD status post proximal LAD stenting by Dr. Rollene Fare in the past with 2 overlapping stents. She had an anterior wall myocardial infarction 08/11/12 and underwent urgent catheterization by Dr. Peter Martinique via right radial approach. He placed a 4 mm x 15 mm long Promus drug-eluting stent with excellent results. Her circumflex and RCA were free of significant disease and her ejection fraction was 55%. She remains on dual antiplatelet therapy. She denies chest pain or shortness of breath.  HYPERTENSION, BENIGN SYSTEMIC History of hypertension with blood pressure measured today at 142/84. She is on metoprolol. Continue current medicines at current dosing  HYPERLIPIDEMIA History of hyperlipidemia on atorvastatin  40 mg a day followed by her PCP. We will obtain copies of her recent lab work  Peripheral arterial disease History of PAD status post peripheral angiography performed 09/16/02 revealing a chronically occluded right external iliac artery. The patient denies claudication.      Lorretta Harp MD FACP,FACC,FAHA, [facility] 09/23/2014 4:03 PM

## 2014-09-23 NOTE — Assessment & Plan Note (Signed)
History of CAD status post proximal LAD stenting by Dr. Rollene Fare in the past with 2 overlapping stents. She had an anterior wall myocardial infarction 08/11/12 and underwent urgent catheterization by Dr. Peter Martinique via right radial approach. He placed a 4 mm x 15 mm long Promus drug-eluting stent with excellent results. Her circumflex and RCA were free of significant disease and her ejection fraction was 55%. She remains on dual antiplatelet therapy. She denies chest pain or shortness of breath.

## 2014-09-23 NOTE — Assessment & Plan Note (Signed)
History of PAD status post peripheral angiography performed 09/16/02 revealing a chronically occluded right external iliac artery. The patient denies claudication.

## 2014-09-23 NOTE — Assessment & Plan Note (Signed)
History of hyperlipidemia on atorvastatin 40 mg a day followed by her PCP. We will obtain copies of her recent lab work

## 2014-09-23 NOTE — Assessment & Plan Note (Signed)
History of hypertension with blood pressure measured today at 142/84. She is on metoprolol. Continue current medicines at current dosing

## 2014-09-23 NOTE — Patient Instructions (Signed)
Your physician wants you to follow-up in 1 year with Dr. Berry. You will receive a reminder letter in the mail 2 months in advance. If you do not receive a letter, please call our office to schedule the follow-up appointment.  

## 2014-10-02 ENCOUNTER — Encounter (HOSPITAL_COMMUNITY): Payer: Self-pay | Admitting: Cardiology

## 2014-10-06 ENCOUNTER — Telehealth: Payer: Self-pay | Admitting: Internal Medicine

## 2014-10-06 DIAGNOSIS — R1013 Epigastric pain: Secondary | ICD-10-CM

## 2014-10-06 DIAGNOSIS — G8929 Other chronic pain: Secondary | ICD-10-CM

## 2014-10-06 DIAGNOSIS — K860 Alcohol-induced chronic pancreatitis: Secondary | ICD-10-CM

## 2014-10-06 MED ORDER — OXYCODONE HCL 5 MG PO TABS: 5.0000 mg | ORAL_TABLET | Freq: Four times a day (QID) | ORAL | Status: DC | PRN

## 2014-10-06 NOTE — Telephone Encounter (Signed)
Ok to refill x 1  

## 2014-10-06 NOTE — Telephone Encounter (Signed)
May we refill Sir? 

## 2014-10-06 NOTE — Telephone Encounter (Signed)
Patient informed and will come Wednesday to pick up.

## 2014-11-06 ENCOUNTER — Other Ambulatory Visit: Payer: Self-pay | Admitting: Cardiovascular Disease

## 2014-11-06 ENCOUNTER — Telehealth: Payer: Self-pay | Admitting: Internal Medicine

## 2014-11-06 DIAGNOSIS — K860 Alcohol-induced chronic pancreatitis: Secondary | ICD-10-CM

## 2014-11-06 DIAGNOSIS — G8929 Other chronic pain: Secondary | ICD-10-CM

## 2014-11-06 DIAGNOSIS — R1013 Epigastric pain: Secondary | ICD-10-CM

## 2014-11-06 MED ORDER — OXYCODONE HCL 5 MG PO TABS: 5.0000 mg | ORAL_TABLET | Freq: Four times a day (QID) | ORAL | Status: DC | PRN

## 2014-11-06 NOTE — Telephone Encounter (Signed)
Please advise Sir, thank you. 

## 2014-11-06 NOTE — Telephone Encounter (Signed)
Spoke with patient and told her that rx would be ready for pick up on 11/07/14.

## 2014-11-06 NOTE — Telephone Encounter (Signed)
OK x 1 

## 2014-11-06 NOTE — Telephone Encounter (Signed)
Rx(s) sent to pharmacy electronically.  

## 2014-11-20 ENCOUNTER — Other Ambulatory Visit: Payer: Self-pay | Admitting: Internal Medicine

## 2014-11-24 ENCOUNTER — Other Ambulatory Visit: Payer: Self-pay | Admitting: Internal Medicine

## 2015-01-07 ENCOUNTER — Telehealth: Payer: Self-pay | Admitting: Internal Medicine

## 2015-01-07 DIAGNOSIS — K860 Alcohol-induced chronic pancreatitis: Secondary | ICD-10-CM

## 2015-01-07 DIAGNOSIS — G8929 Other chronic pain: Secondary | ICD-10-CM

## 2015-01-07 DIAGNOSIS — R1013 Epigastric pain: Secondary | ICD-10-CM

## 2015-01-07 NOTE — Telephone Encounter (Signed)
May we refill Sir? 

## 2015-01-08 MED ORDER — OXYCODONE HCL 5 MG PO TABS: 5.0000 mg | ORAL_TABLET | Freq: Four times a day (QID) | ORAL | Status: DC | PRN

## 2015-01-08 NOTE — Telephone Encounter (Signed)
Ok to refill 

## 2015-01-08 NOTE — Telephone Encounter (Signed)
Patient informed that rx up front for pick up.

## 2015-01-15 ENCOUNTER — Other Ambulatory Visit: Payer: Self-pay

## 2015-01-15 DIAGNOSIS — Z1231 Encounter for screening mammogram for malignant neoplasm of breast: Secondary | ICD-10-CM

## 2015-01-27 ENCOUNTER — Ambulatory Visit: Payer: Medicare Other

## 2015-02-03 ENCOUNTER — Other Ambulatory Visit: Payer: Self-pay | Admitting: Internal Medicine

## 2015-02-06 ENCOUNTER — Ambulatory Visit: Payer: Medicare Other

## 2015-03-10 ENCOUNTER — Telehealth: Payer: Self-pay | Admitting: Internal Medicine

## 2015-03-10 DIAGNOSIS — K860 Alcohol-induced chronic pancreatitis: Secondary | ICD-10-CM

## 2015-03-10 DIAGNOSIS — G8929 Other chronic pain: Secondary | ICD-10-CM

## 2015-03-10 DIAGNOSIS — R1013 Epigastric pain: Secondary | ICD-10-CM

## 2015-03-10 MED ORDER — OXYCODONE HCL 5 MG PO TABS: 5.0000 mg | ORAL_TABLET | Freq: Four times a day (QID) | ORAL | Status: DC | PRN

## 2015-03-10 NOTE — Telephone Encounter (Signed)
Please advise Sir? 

## 2015-03-10 NOTE — Telephone Encounter (Signed)
Spoke with patient and informed her that her rx is ready for pick up and she said she will make an office visit appointment when she comes to get it.

## 2015-03-10 NOTE — Telephone Encounter (Signed)
OK to refill x 1 Please schedule a f/u visit also

## 2015-04-06 ENCOUNTER — Encounter: Payer: Self-pay | Admitting: Internal Medicine

## 2015-04-15 ENCOUNTER — Telehealth: Payer: Self-pay | Admitting: Internal Medicine

## 2015-04-15 DIAGNOSIS — G8929 Other chronic pain: Secondary | ICD-10-CM

## 2015-04-15 DIAGNOSIS — R1013 Epigastric pain: Secondary | ICD-10-CM

## 2015-04-15 DIAGNOSIS — K860 Alcohol-induced chronic pancreatitis: Secondary | ICD-10-CM

## 2015-04-15 NOTE — Telephone Encounter (Signed)
OK I could sign it tomorrow when there

## 2015-04-15 NOTE — Telephone Encounter (Signed)
Please advise Sir, thank you. 

## 2015-04-15 NOTE — Telephone Encounter (Signed)
Informed patient and she request I call her back tomorrow afternoon when rx ready.

## 2015-04-16 MED ORDER — OXYCODONE HCL 5 MG PO TABS: 5.0000 mg | ORAL_TABLET | Freq: Four times a day (QID) | ORAL | Status: DC | PRN

## 2015-04-16 NOTE — Telephone Encounter (Signed)
Patient aware that oxy rx up front for pick up.

## 2015-04-20 ENCOUNTER — Other Ambulatory Visit: Payer: Self-pay

## 2015-05-05 ENCOUNTER — Ambulatory Visit: Payer: Medicare Other | Admitting: Internal Medicine

## 2015-05-05 ENCOUNTER — Telehealth: Payer: Self-pay | Admitting: Internal Medicine

## 2015-05-05 NOTE — Telephone Encounter (Signed)
Ok No charge

## 2015-05-05 NOTE — Telephone Encounter (Signed)
Pt called to reschedule her appointment for today due to diarrhea.  She is now scheduled. For 06/2015 @ 11:30 am

## 2015-07-01 ENCOUNTER — Ambulatory Visit: Payer: Medicare Other | Admitting: Internal Medicine

## 2015-07-01 ENCOUNTER — Telehealth: Payer: Self-pay | Admitting: Internal Medicine

## 2015-07-25 ENCOUNTER — Other Ambulatory Visit: Payer: Self-pay | Admitting: Internal Medicine

## 2015-08-27 ENCOUNTER — Emergency Department (HOSPITAL_COMMUNITY): Payer: Medicare Other

## 2015-08-27 ENCOUNTER — Encounter (HOSPITAL_COMMUNITY): Payer: Self-pay | Admitting: Emergency Medicine

## 2015-08-27 ENCOUNTER — Observation Stay (HOSPITAL_COMMUNITY)
Admission: EM | Admit: 2015-08-27 | Discharge: 2015-08-28 | Disposition: A | Discharge status: Home / Self Care | Payer: Medicare Other | Attending: Cardiovascular Disease | Admitting: Cardiovascular Disease

## 2015-08-27 DIAGNOSIS — Z79899 Other long term (current) drug therapy: Secondary | ICD-10-CM | POA: Diagnosis not present

## 2015-08-27 DIAGNOSIS — F329 Major depressive disorder, single episode, unspecified: Secondary | ICD-10-CM | POA: Diagnosis not present

## 2015-08-27 DIAGNOSIS — Z7982 Long term (current) use of aspirin: Secondary | ICD-10-CM | POA: Diagnosis not present

## 2015-08-27 DIAGNOSIS — I25119 Atherosclerotic heart disease of native coronary artery with unspecified angina pectoris: Secondary | ICD-10-CM | POA: Diagnosis not present

## 2015-08-27 DIAGNOSIS — Z87891 Personal history of nicotine dependence: Secondary | ICD-10-CM | POA: Insufficient documentation

## 2015-08-27 DIAGNOSIS — Z86018 Personal history of other benign neoplasm: Secondary | ICD-10-CM | POA: Insufficient documentation

## 2015-08-27 DIAGNOSIS — I10 Essential (primary) hypertension: Secondary | ICD-10-CM | POA: Diagnosis present

## 2015-08-27 DIAGNOSIS — K760 Fatty (change of) liver, not elsewhere classified: Secondary | ICD-10-CM | POA: Insufficient documentation

## 2015-08-27 DIAGNOSIS — Z872 Personal history of diseases of the skin and subcutaneous tissue: Secondary | ICD-10-CM | POA: Insufficient documentation

## 2015-08-27 DIAGNOSIS — R072 Precordial pain: Principal | ICD-10-CM | POA: Insufficient documentation

## 2015-08-27 DIAGNOSIS — Z8701 Personal history of pneumonia (recurrent): Secondary | ICD-10-CM | POA: Diagnosis not present

## 2015-08-27 DIAGNOSIS — K219 Gastro-esophageal reflux disease without esophagitis: Secondary | ICD-10-CM | POA: Insufficient documentation

## 2015-08-27 DIAGNOSIS — I2510 Atherosclerotic heart disease of native coronary artery without angina pectoris: Secondary | ICD-10-CM | POA: Diagnosis present

## 2015-08-27 DIAGNOSIS — K861 Other chronic pancreatitis: Secondary | ICD-10-CM | POA: Insufficient documentation

## 2015-08-27 DIAGNOSIS — E119 Type 2 diabetes mellitus without complications: Secondary | ICD-10-CM | POA: Insufficient documentation

## 2015-08-27 DIAGNOSIS — R079 Chest pain, unspecified: Secondary | ICD-10-CM | POA: Diagnosis present

## 2015-08-27 DIAGNOSIS — I739 Peripheral vascular disease, unspecified: Secondary | ICD-10-CM | POA: Insufficient documentation

## 2015-08-27 DIAGNOSIS — E1159 Type 2 diabetes mellitus with other circulatory complications: Secondary | ICD-10-CM

## 2015-08-27 DIAGNOSIS — I252 Old myocardial infarction: Secondary | ICD-10-CM | POA: Insufficient documentation

## 2015-08-27 DIAGNOSIS — E785 Hyperlipidemia, unspecified: Secondary | ICD-10-CM | POA: Diagnosis present

## 2015-08-27 HISTORY — DX: Essential (primary) hypertension: I10

## 2015-08-27 HISTORY — DX: Other chronic pancreatitis: K86.1

## 2015-08-27 HISTORY — DX: Type 2 diabetes mellitus without complications: E11.9

## 2015-08-27 HISTORY — DX: Peripheral vascular disease, unspecified: I73.9

## 2015-08-27 HISTORY — DX: ST elevation (STEMI) myocardial infarction involving other coronary artery of anterior wall: I21.09

## 2015-08-27 LAB — CBC
HCT: 30.9 % — ABNORMAL LOW (ref 36.0–46.0)
Hemoglobin: 10.4 g/dL — ABNORMAL LOW (ref 12.0–15.0)
MCH: 22 pg — ABNORMAL LOW (ref 26.0–34.0)
MCHC: 33.7 g/dL (ref 30.0–36.0)
MCV: 65.5 fL — AB (ref 78.0–100.0)
PLATELETS: 197 10*3/uL (ref 150–400)
RBC: 4.72 MIL/uL (ref 3.87–5.11)
RDW: 17.8 % — AB (ref 11.5–15.5)
WBC: 5 10*3/uL (ref 4.0–10.5)

## 2015-08-27 LAB — BASIC METABOLIC PANEL
Anion gap: 17 — ABNORMAL HIGH (ref 5–15)
BUN: 6 mg/dL (ref 6–20)
CHLORIDE: 100 mmol/L — AB (ref 101–111)
CO2: 16 mmol/L — ABNORMAL LOW (ref 22–32)
CREATININE: 0.93 mg/dL (ref 0.44–1.00)
Calcium: 8.4 mg/dL — ABNORMAL LOW (ref 8.9–10.3)
GFR calc Af Amer: >60 mL/min (ref >60)
Glucose, Bld: 87 mg/dL (ref 65–99)
Potassium: 3 mmol/L — ABNORMAL LOW (ref 3.5–5.1)
SODIUM: 133 mmol/L — AB (ref 135–145)

## 2015-08-27 LAB — I-STAT TROPONIN, ED: Troponin i, poc: 0 ng/mL (ref 0.00–0.08)

## 2015-08-27 LAB — TROPONIN I: Troponin I: <0.03 ng/mL (ref <0.031)

## 2015-08-27 MED ORDER — CLOPIDOGREL BISULFATE 75 MG PO TABS
75.0000 mg | ORAL_TABLET | Freq: Every day | ORAL | Status: DC
  Administered 2015-08-28: 75 mg via ORAL
  Filled 2015-08-27: qty 1

## 2015-08-27 MED ORDER — REGADENOSON 0.4 MG/5ML IV SOLN
0.4000 mg | Freq: Once | INTRAVENOUS | Status: DC
  Filled 2015-08-27: qty 5

## 2015-08-27 MED ORDER — HEPARIN SODIUM (PORCINE) 5000 UNIT/ML IJ SOLN
5000.0000 [IU] | Freq: Three times a day (TID) | INTRAMUSCULAR | Status: DC
  Administered 2015-08-27 – 2015-08-28 (×2): 5000 [IU] via SUBCUTANEOUS
  Filled 2015-08-27 (×2): qty 1

## 2015-08-27 MED ORDER — OXYCODONE HCL 5 MG PO TABS
5.0000 mg | ORAL_TABLET | Freq: Four times a day (QID) | ORAL | Status: DC | PRN
Start: 2015-08-27 — End: 2015-08-28

## 2015-08-27 MED ORDER — VITAMIN D (ERGOCALCIFEROL) 1.25 MG (50000 UNIT) PO CAPS
50000.0000 [IU] | ORAL_CAPSULE | ORAL | Status: DC
Start: 2015-08-28 — End: 2015-08-28
  Filled 2015-08-27: qty 1

## 2015-08-27 MED ORDER — ASPIRIN EC 81 MG PO TBEC
81.0000 mg | DELAYED_RELEASE_TABLET | Freq: Every day | ORAL | Status: DC
  Administered 2015-08-28: 81 mg via ORAL
  Filled 2015-08-27: qty 1

## 2015-08-27 MED ORDER — NITROGLYCERIN 2 % TD OINT
0.5000 [in_us] | TOPICAL_OINTMENT | Freq: Four times a day (QID) | TRANSDERMAL | Status: DC
  Administered 2015-08-28: 0.5 [in_us] via TOPICAL
  Filled 2015-08-27: qty 30

## 2015-08-27 MED ORDER — SODIUM CHLORIDE 0.9 % IJ SOLN: 3.0000 mL | INTRAMUSCULAR | Status: DC | PRN

## 2015-08-27 MED ORDER — METOCLOPRAMIDE HCL 10 MG PO TABS: 10.0000 mg | ORAL_TABLET | Freq: Four times a day (QID) | ORAL | Status: DC | PRN

## 2015-08-27 MED ORDER — NICOTINE 14 MG/24HR TD PT24
14.0000 mg | MEDICATED_PATCH | Freq: Every day | TRANSDERMAL | Status: DC
  Administered 2015-08-28: 14 mg via TRANSDERMAL
  Filled 2015-08-27: qty 1

## 2015-08-27 MED ORDER — METFORMIN HCL 500 MG PO TABS: 500.0000 mg | ORAL_TABLET | Freq: Every day | ORAL | Status: DC

## 2015-08-27 MED ORDER — ATORVASTATIN CALCIUM 40 MG PO TABS: 40.0000 mg | ORAL_TABLET | Freq: Every day | ORAL | Status: DC

## 2015-08-27 MED ORDER — ACETAMINOPHEN 325 MG PO TABS: 650.0000 mg | ORAL_TABLET | ORAL | Status: DC | PRN

## 2015-08-27 MED ORDER — METOPROLOL TARTRATE 12.5 MG HALF TABLET
12.5000 mg | ORAL_TABLET | Freq: Two times a day (BID) | ORAL | Status: DC
Start: 2015-08-27 — End: 2015-08-28
  Administered 2015-08-28 (×2): 12.5 mg via ORAL
  Filled 2015-08-27 (×3): qty 1

## 2015-08-27 MED ORDER — METFORMIN HCL 500 MG PO TABS
500.0000 mg | ORAL_TABLET | Freq: Every day | ORAL | Status: DC
  Administered 2015-08-28: 500 mg via ORAL
  Filled 2015-08-27: qty 1

## 2015-08-27 MED ORDER — SODIUM CHLORIDE 0.9 % IJ SOLN
3.0000 mL | Freq: Two times a day (BID) | INTRAMUSCULAR | Status: DC
  Administered 2015-08-27: 3 mL via INTRAVENOUS

## 2015-08-27 MED ORDER — QUETIAPINE FUMARATE 25 MG PO TABS
25.0000 mg | ORAL_TABLET | Freq: Every day | ORAL | Status: DC
  Administered 2015-08-27: 25 mg via ORAL
  Filled 2015-08-27: qty 1

## 2015-08-27 MED ORDER — SODIUM CHLORIDE 0.9 % IV SOLN: 250.0000 mL | INTRAVENOUS | Status: DC | PRN

## 2015-08-27 MED ORDER — PANCRELIPASE (LIP-PROT-AMYL) 12000-38000 UNITS PO CPEP
24000.0000 [IU] | ORAL_CAPSULE | Freq: Three times a day (TID) | ORAL | Status: DC
  Administered 2015-08-28: 24000 [IU] via ORAL
  Filled 2015-08-27: qty 2

## 2015-08-27 MED ORDER — ONDANSETRON HCL 4 MG/2ML IJ SOLN: 4.0000 mg | Freq: Four times a day (QID) | INTRAMUSCULAR | Status: DC | PRN

## 2015-08-27 MED ORDER — MIRTAZAPINE 15 MG PO TABS
15.0000 mg | ORAL_TABLET | Freq: Every day | ORAL | Status: DC
  Administered 2015-08-28: 15 mg via ORAL
  Filled 2015-08-27 (×2): qty 1

## 2015-08-27 MED ORDER — NITROGLYCERIN 0.4 MG SL SUBL: 0.4000 mg | SUBLINGUAL_TABLET | SUBLINGUAL | Status: DC | PRN

## 2015-08-27 MED ORDER — SODIUM CHLORIDE 0.9 % IV BOLUS (SEPSIS)
1000.0000 mL | Freq: Once | INTRAVENOUS | Status: AC
  Administered 2015-08-27: 1000 mL via INTRAVENOUS

## 2015-08-27 MED ORDER — PANTOPRAZOLE SODIUM 40 MG PO TBEC
40.0000 mg | DELAYED_RELEASE_TABLET | Freq: Every day | ORAL | Status: DC
  Administered 2015-08-28: 40 mg via ORAL
  Filled 2015-08-27: qty 1

## 2015-08-27 MED ORDER — CLONAZEPAM 1 MG PO TABS
1.0000 mg | ORAL_TABLET | Freq: Every day | ORAL | Status: DC
  Administered 2015-08-27: 1 mg via ORAL
  Filled 2015-08-27: qty 2

## 2015-08-27 NOTE — H&P (Signed)
Primary cardiologist: Dr. Quay Burow  Reason for admission: Recurrent chest pain, CAD  Clinical Summary Ms. Partridge is a 69 y.o.female with past medical history outlined below, now presenting to the ER complaining of 2 day history of worsening chest discomfort consistent with her previous angina. She states that she has been under a lot of stress, recently lost 2 sisters within the last few weeks. She reports compliance with her medications, and does have a history of occasional angina which is responsive to nitroglycerin. She came to the ER as her symptoms did not improve as usual.  ECG in the ER showed sinus rhythm with counterclockwise rotation and nonspecific ST-T changes. Initial troponin I level is normal at 0.00.  Last cardiac catheterization was in 2013 at which time she underwent placement of DES to an occluded LAD in the setting of anterior STEMI. She had previously undergone placement of overlapping Taxus DES to the LAD in 2007. She has been on aspirin and Plavix since that time.  Was last seen in the office by Dr. Gwenlyn Found in December 2015.   Allergies  Allergen Reactions  . Diclofenac Sodium     REACTION: Chronic renal insufficiency.  . Other Other (See Comments)    Blackeyed peas and Pinto Beans...break out in hives    Home Medications . No current facility-administered medications on file prior to encounter.   Current Outpatient Prescriptions on File Prior to Encounter  Medication Sig Dispense Refill  . aspirin EC 81 MG EC tablet Take 1 tablet (81 mg total) by mouth daily.    Marland Kitchen atorvastatin (LIPITOR) 40 MG tablet TAKE ONE (1) TABLET EACH DAY AT 6PM 30 tablet 5  . clonazePAM (KLONOPIN) 1 MG tablet Take 1 tablet (1 mg total) by mouth at bedtime. 30 tablet 0  . clopidogrel (PLAVIX) 75 MG tablet Take 75 mg by mouth daily.      Marland Kitchen CREON 24000 UNITS CPEP TAKE 3 CAPSULES 3 TIMES A DAY WITH MEALS AND TAKE ONE CAPSULE WITH SNACKS 180 capsule 1  . meloxicam (MOBIC) 7.5 MG  tablet Take 7.5 mg by mouth daily.   5  . metFORMIN (GLUCOPHAGE) 500 MG tablet Take 500 mg by mouth daily.      . metoCLOPramide (REGLAN) 10 MG tablet Take 1 tablet (10 mg total) by mouth every 6 (six) hours as needed (nausea). 20 tablet 0  . metoprolol tartrate (LOPRESSOR) 25 MG tablet Take 0.5 tablets (12.5 mg total) by mouth 2 (two) times daily. 90 tablet 0  . mirtazapine (REMERON) 15 MG tablet Take 1 tablet (15 mg total) by mouth at bedtime. 30 tablet 0  . nicotine (NICODERM CQ) 14 mg/24hr patch Place 1 patch onto the skin daily. 28 patch 0  . nitroGLYCERIN (NITROSTAT) 0.4 MG SL tablet Place 1 tablet (0.4 mg total) under the tongue every 5 (five) minutes x 3 doses as needed for chest pain. 25 tablet 4  . omeprazole (PRILOSEC) 40 MG capsule TAKE ONE CAPSULE BY MOUTH DAILY 90 capsule 1  . oxyCODONE (OXY IR/ROXICODONE) 5 MG immediate release tablet Take 1-2 tablets (5-10 mg total) by mouth every 6 (six) hours as needed for moderate pain, severe pain or breakthrough pain. 40 tablet 0  . QUEtiapine (SEROQUEL) 25 MG tablet Take 25 mg by mouth at bedtime.    . Vitamin D, Ergocalciferol, (DRISDOL) 50000 UNITS CAPS capsule Take 50,000 Units by mouth every 7 (seven) days. No specific day  5  . metoprolol tartrate (LOPRESSOR) 25 MG tablet  Take 0.5 tablets (12.5 mg total) by mouth 2 (two) times daily. (Patient not taking: Reported on 08/27/2015) 90 tablet 3  . omeprazole (PRILOSEC) 40 MG capsule TAKE ONE CAPSULE BY MOUTH DAILY (Patient not taking: Reported on 08/27/2015) 1 capsule 0     Past Medical History  Diagnosis Date  . Type 2 diabetes mellitus ([place])   . Chronic pancreatitis ([place])   . GERD (gastroesophageal reflux disease)   . Essential hypertension   . History of pneumonia 11/2011  . Alcohol abuse     Abstinent since 2009  . Psoriasis   . Hemorrhoids   . Colon adenoma 03/2012    Diminutive cecal  . ST elevation myocardial infarction (STEMI) of anterior wall ([place]) 07/2012  . Hiatal hernia     . Common bile duct stricture   . GERD (gastroesophageal reflux disease)   . Hyperlipidemia   . Depression   . Fatty liver   . Coronary artery disease     DES x 2 to LAD 2007, DES to LAD 07/2012  . PAD (peripheral artery disease) ([facility])     Occluded right external iliac artery     Past Surgical History  Procedure Laterality Date  . Cholecystectomy  1976  . Abdominal hysterectomy  1994  . Eus  01/05/2012    Procedure: UPPER ENDOSCOPIC ULTRASOUND (EUS) LINEAR;  Surgeon: Milus Banister, MD;  Location: [facility];  Service: Endoscopy;  Laterality: N/A;  radial linear  . Colonoscopy      multiple  . Ercp  07/25/2012    Procedure: ENDOSCOPIC RETROGRADE CHOLANGIOPANCREATOGRAPHY (ERCP);  Surgeon: Gatha Mayer, MD;  Location: [facility];  Service: Endoscopy;  Laterality: N/A;  with stent  . Esophagogastroduodenoscopy  10/25/2012    Procedure: ESOPHAGOGASTRODUODENOSCOPY (EGD);  Surgeon: Gatha Mayer, MD;  Location: [facility];  Service: Endoscopy;  Laterality: N/A;  EGD with stent removal using ERCP scope  . Hemorrhoid surgery  09/17/2013    [place] hem ligation/pexy  . Transthoracic echocardiogram  04/20/10    PROXIMAL SEPTAL THICKENING IS NOTED. LV SYSTOLIC FUNCTION IS NORMAL. EF= >55%. LEFT ATRIAL SIZE IS NORMAL. RIGHT VENTRICULAR SYSTOLIC PRESSURE IS NORMAL. AV APPEARS TO BE MILDLY SCLEROTIC. NO AS.  Marland Kitchen Stress myocardial perfusion  04/20/10    NORMAL PATTERN OF PERFUSION IN ALL REGIONS. LV SIZE IS NORMAL. EF= 61%. NO SIGN ISCHEMIA DEMONSTRATED.  Marland Kitchen Left heart cath N/A 08/10/2012    Procedure: LEFT HEART CATH;  Surgeon: Peter M Martinique, MD;  Location: [facility] CATH LAB;  Service: Cardiovascular;  Laterality: N/A;  . Percutaneous coronary stent intervention (pci-s)  08/10/2012    Procedure: PERCUTANEOUS CORONARY STENT INTERVENTION (PCI-S);  Surgeon: Peter M Martinique, MD;  Location: [facility] CATH LAB;  Service: Cardiovascular;;    Family History  Problem Relation Age of Onset  . Malignant hyperthermia  Neg Hx   . Colon cancer Mother     Colon resection  . Coronary artery disease Brother   . Renal Disease Brother   . Cirrhosis Father   . Diabetes Mother     Social History Ms. Fehrenbach reports that she has quit smoking. Her smoking use included Cigarettes. She has a 7.5 pack-year smoking history. She has never used smokeless tobacco. Ms. Bohan reports that she does not drink alcohol.  Review of Systems Complete review of systems negative except as otherwise outlined in the clinical summary and also the following. Denies any recent abdominal pain. She follows with GI with a history of pancreatitis. No reported bleeding problems,  no changes in bowel habit.  Physical Examination Temp:  [98.5 F (36.9 C)] 98.5 F (36.9 C) (11/03 1713) Pulse Rate:  [90-98] 95 (11/03 1945) Resp:  [16-23] 16 (11/03 1945) BP: (96-118)/(59-82) 118/82 mmHg (11/03 1945) SpO2:  [94 %-98 %] 94 % (11/03 1945)  Telemetry: Sinus rhythm.  Gen.: Thin, chronically ill-appearing woman in no acute distress. HEENT: Conjunctiva and lids normal, oropharynx clear with poor dentition. Neck: Supple, no elevated JVP or carotid bruits, no thyromegaly. Lungs: Clear to auscultation, nonlabored breathing at rest. Cardiac: Regular rate and rhythm, no S3, soft systolic murmur, no pericardial rub. Abdomen: Soft, nontender, bowel sounds present, no guarding or rebound. Extremities: No pitting edema, distal pulses 1-2+. Skin: Warm and dry. Musculoskeletal: No kyphosis. Neuropsychiatric: Alert and oriented x3, affect grossly appropriate.  Lab Results  Basic Metabolic Panel:  Recent Labs Lab 08/27/15 1755  NA 133*  K 3.0*  CL 100*  CO2 16*  GLUCOSE 87  BUN 6  CREATININE 0.93  CALCIUM 8.4*    CBC:  Recent Labs Lab 08/27/15 1755  WBC 5.0  HGB 10.4*  HCT 30.9*  MCV 65.5*  PLT 197    Cardiac Enzymes: Troponin I 0.00  Imaging FINDINGS: Borderline wedging of 2 upper thoracic vertebral bodies,  likely chronic compared to the 2011 CT. Midline trachea. Normal heart size. Tortuous thoracic aorta. Coronary artery stent. No pleural effusion or pneumothorax. No congestive failure. Clear lungs.  IMPRESSION: No acute cardiopulmonary disease.   Impression  1. Recurrent chest pain in the setting of known CAD. Also recent stress and grieving with the loss of 2 sisters within the last few weeks. ECG shows no acute ST segment changes and initial troponin I level is negative.  2. CAD status post overlapping Taxus DES to the LAD in 2007, anterior STEMI in 2013 with occluded LAD treated with DES. She remains on dual antiplatelet therapy.  3.Type 2 diabetes mellitus, on metformin.  4. Essential hypertension.  5. History of chronic pancreatitis, followed by gastroenterology.  6. PAD with known occlusion of the right external iliac.   Recommendations  Patient is being admitted to telemetry for observation. Cycle cardiac markers, continue outpatient medications with the addition of nitroglycerin paste. DVT dose heparin for now unless cardiac markers trend abnormally. She will be kept nothing by mouth after midnight with tentative plan for [facility] tomorrow.  Satira Sark, M.D., F.A.C.C.

## 2015-08-27 NOTE — ED Notes (Signed)
Report attempted 

## 2015-08-27 NOTE — ED Notes (Signed)
Per EMS patient has had feeling tightness in her chest for two days.  Patient states she feels short of breath.  States she took 2 SL nitro with no relief and called 911.  EMS gave 1 SL nitro en route and 324mg  aspirin.  Patient in no apparent distress at this time.

## 2015-08-28 ENCOUNTER — Other Ambulatory Visit: Payer: Self-pay | Admitting: Student

## 2015-08-28 DIAGNOSIS — E785 Hyperlipidemia, unspecified: Secondary | ICD-10-CM | POA: Diagnosis not present

## 2015-08-28 DIAGNOSIS — I10 Essential (primary) hypertension: Secondary | ICD-10-CM | POA: Diagnosis not present

## 2015-08-28 DIAGNOSIS — I25110 Atherosclerotic heart disease of native coronary artery with unstable angina pectoris: Secondary | ICD-10-CM

## 2015-08-28 DIAGNOSIS — R072 Precordial pain: Secondary | ICD-10-CM | POA: Diagnosis not present

## 2015-08-28 DIAGNOSIS — I200 Unstable angina: Secondary | ICD-10-CM

## 2015-08-28 LAB — BASIC METABOLIC PANEL
Anion gap: 11 (ref 5–15)
BUN: 6 mg/dL (ref 6–20)
CHLORIDE: 112 mmol/L — AB (ref 101–111)
CO2: 19 mmol/L — AB (ref 22–32)
CREATININE: 0.96 mg/dL (ref 0.44–1.00)
Calcium: 8 mg/dL — ABNORMAL LOW (ref 8.9–10.3)
GFR calc non Af Amer: 60 mL/min — ABNORMAL LOW (ref >60)
Glucose, Bld: 91 mg/dL (ref 65–99)
POTASSIUM: 3.3 mmol/L — AB (ref 3.5–5.1)
SODIUM: 142 mmol/L (ref 135–145)

## 2015-08-28 LAB — CBC
HEMATOCRIT: 28.4 % — AB (ref 36.0–46.0)
Hemoglobin: 9.8 g/dL — ABNORMAL LOW (ref 12.0–15.0)
MCH: 22.8 pg — AB (ref 26.0–34.0)
MCHC: 34.5 g/dL (ref 30.0–36.0)
MCV: 66.2 fL — AB (ref 78.0–100.0)
Platelets: 169 10*3/uL (ref 150–400)
RBC: 4.29 MIL/uL (ref 3.87–5.11)
RDW: 18.3 % — ABNORMAL HIGH (ref 11.5–15.5)
WBC: 4 10*3/uL (ref 4.0–10.5)

## 2015-08-28 LAB — TROPONIN I: Troponin I: 0.03 ng/mL (ref <0.031)

## 2015-08-28 NOTE — Discharge Instructions (Signed)
You have a Stress Test scheduled at [facility]. Your doctor has ordered this test to get a better idea of how your heart works.  TEST SCHEDULED ON 09/03/2015 at 7:30AM.  Please arrive 15 minutes early for paperwork.   Location: [place] [phone]  Instructions:  No food/drink after midnight the night before.   No caffeine/decaf products 24 hours before, including medicines such as Excedrin or Goody Powders. Call if there are any questions.   Wear comfortable clothes and shoes.   It is OK to take your morning meds with a sip of water EXCEPT for those types of medicines listed below or otherwise instructed.  Special Medication Instructions:  Beta blockers such as metoprolol (Lopressor/Toprol XL), atenolol (Tenormin), carvedilol (Coreg), nebivolol (Bystolic), propranolol (Inderal) should not be taken for 24 hours before the test.  Calcium channel blockers such as diltiazem (Cardizem) or verapmil (Calan) should not be taken for 24 hours before the test.  Remove nitroglycerin patches and do not take nitrate preparations such as Imdur/isosorbide the day of your test.  No Persantine/Theophylline or Aggrenox medicines should be used within 24 hours of the test.   What To Expect: The whole test will take several hours. When you arrive in the lab, the technician will inject a small amount of radioactive tracer into your arm through an IV while you are resting quietly. This helps [place] to form pictures of your heart. You will likely only feel a sting from the IV. After a waiting period, resting pictures will be obtained under a big camera. These are the "before" pictures.  Next, you will be prepped for the stress portion of the test. This may include either walking on a treadmill or receiving a medicine that helps to dilate blood vessels in your heart to simulate the effect of exercise on your heart. If you are  walking on a treadmill, you will walk at different paces to try to get your heart rate to a goal number that is based on your age. If your doctor has chosen the pharmacologic test, then you will receive a medicine through your IV that may cause temporary nausea, flushing, shortness of breath and sometimes chest discomfort or vomiting. This is typically short-lived and usually resolves quickly. Your blood pressure and heart rate will be monitored, and we will be watching your EKG on a computer screen for any changes. During this portion of the test, the radiologist will inject another small amount of radioactive tracer into your IV. After a waiting period, you will undergo a second set of pictures. These are the "after" pictures.  The doctor reading the test will compare the before-and-after images to look for evidence of heart blockages or heart weakness. In certain instances, this test is done over 2 days but usually only takes 1 day to complete.

## 2015-08-28 NOTE — Discharge Summary (Signed)
CARDIOLOGY DISCHARGE SUMMARY   Patient ID: [name] F[facility] MRN: [id number] DOB/AGE: [id number] y.o.  Admit date: 08/27/2015 Discharge date: 08/28/2015  PCP: Philis Fendt, MD Primary Cardiologist: Dr. Gwenlyn Found  Primary Discharge Diagnosis: Unstable Angina Secondary Discharge Diagnosis: Hyperlipidemia with target LDL less than 100, HYPERTENSION, BENIGN SYSTEMIC, CAD (coronary artery disease), native coronary artery, Type 2 diabetes mellitus ([place])  Consults: None   Procedures: None  Hospital Course: [name] Hollings is a 69 y.o. female with past medical history of CAD (s/p DES to LAD in 2007, DES to occluded LAD in 2013), Type 2 DM, HTN, HLD, and PAD who presented to [name] ED on 08/27/2015 for recurrent episodes of chest pain for the past two days which was worse with lying down.  She reported the pain was not relieved with SL NTG like usual which prompted her to call EMS. She did report being under a lot of stress in the recent weeks due to the passing of her two sisters. Her initial EKG showed sinus rhythm with no acute ST changes since the previous tracing. A Myoview stress test was planned the following morning for further evaluation of the patient's symptoms.   Overnight, the patient denied any recurrence in her chest pain or other anginal symptoms. Her first two troponin values were negative. She had been NPO since midnight in anticipation of her stress test, but the patient refused the test in stating she "had things to do at home". The risks and benefits of the test were discussed with the patient and it was recommended she have the test while inpatient, but she still refused. She did agree to an outpatient stress test and this has been scheduled on 09/03/2015 at 0730. Instructions in preparation for the test were given to the patient with her hospital discharge paperwork.   The patient was last examined by Dr. Oval Linsey and deemed stable for discharge.  Labs:   Lab  Results  Component Value Date   WBC 4.0 08/28/2015   HGB 9.8* 08/28/2015   HCT 28.4* 08/28/2015   MCV 66.2* 08/28/2015   PLT 169 08/28/2015     Recent Labs Lab 08/28/15 0348  NA 142  K 3.3*  CL 112*  CO2 19*  BUN 6  CREATININE 0.96  CALCIUM 8.0*  GLUCOSE 91    Recent Labs  08/27/15 2235 08/28/15 0348  TROPONINI <0.03 0.03   No results for input(s): INR in the last 72 hours.    Radiology: Dg Chest 2 View: 08/27/2015  CLINICAL DATA:  Short of breath and left-sided chest pain for 2 days. Hypertension. Diabetes. Coronary artery stent. EXAM: CHEST  2 VIEW COMPARISON:  08/11/2012 FINDINGS: Borderline wedging of 2 upper thoracic vertebral bodies, likely chronic compared to the 2011 CT. Midline trachea. Normal heart size. Tortuous thoracic aorta. Coronary artery stent. No pleural effusion or pneumothorax. No congestive failure. Clear lungs. IMPRESSION: No acute cardiopulmonary disease. Electronically Signed   By: Abigail Miyamoto M.D.   On: 08/27/2015 19:08   EKG: NSR with rate in 90's. No acute ST or T-wave changes since previous tracing.   FOLLOW UP PLANS AND APPOINTMENTS Allergies  Allergen Reactions  . Diclofenac Sodium     REACTION: Chronic renal insufficiency.  . Other Other (See Comments)    Blackeyed peas and Pinto Beans...break out in hives     Medication List    STOP taking these medications        meloxicam 7.5 MG tablet  Commonly known  as:  MOBIC     omeprazole 40 MG capsule  Commonly known as:  PRILOSEC      TAKE these medications        aspirin 81 MG EC tablet  Take 1 tablet (81 mg total) by mouth daily.     atorvastatin 40 MG tablet  Commonly known as:  LIPITOR  TAKE ONE (1) TABLET EACH DAY AT 6PM     clonazePAM 1 MG tablet  Commonly known as:  KLONOPIN  Take 1 tablet (1 mg total) by mouth at bedtime.     clopidogrel 75 MG tablet  Commonly known as:  PLAVIX  Take 75 mg by mouth daily.     CREON 24000 UNITS Cpep  Generic drug:  Pancrelipase  (Lip-Prot-Amyl)  TAKE 3 CAPSULES 3 TIMES A DAY WITH MEALS AND TAKE ONE CAPSULE WITH SNACKS     metFORMIN 500 MG tablet  Commonly known as:  GLUCOPHAGE  Take 500 mg by mouth daily.     metoCLOPramide 10 MG tablet  Commonly known as:  REGLAN  Take 1 tablet (10 mg total) by mouth every 6 (six) hours as needed (nausea).     metoprolol tartrate 25 MG tablet  Commonly known as:  LOPRESSOR  Take 0.5 tablets (12.5 mg total) by mouth 2 (two) times daily.     mirtazapine 15 MG tablet  Commonly known as:  REMERON  Take 1 tablet (15 mg total) by mouth at bedtime.     nicotine 14 mg/24hr patch  Commonly known as:  NICODERM CQ  Place 1 patch onto the skin daily.     nitroGLYCERIN 0.4 MG SL tablet  Commonly known as:  NITROSTAT  Place 1 tablet (0.4 mg total) under the tongue every 5 (five) minutes x 3 doses as needed for chest pain.     oxyCODONE 5 MG immediate release tablet  Commonly known as:  Oxy IR/ROXICODONE  Take 1-2 tablets (5-10 mg total) by mouth every 6 (six) hours as needed for moderate pain, severe pain or breakthrough pain.     QUEtiapine 25 MG tablet  Commonly known as:  SEROQUEL  Take 25 mg by mouth at bedtime.     Vitamin D (Ergocalciferol) 50000 UNITS Caps capsule  Commonly known as:  DRISDOL  Take 50,000 Units by mouth every 7 (seven) days. No specific day         Follow-up Information    Follow up with [facility] On 09/03/2015.   Specialty:  Cardiology   Why:  Nuclear Stress Test on 09/03/2015 at 7:30 AM. Nothing to eat or drink after midnight before the test.   Contact information:   [place] [phone]      Follow up with Philis Fendt, MD On 09/14/2015.   Specialty:  Internal Medicine   Why:  Follow up @12pm    Contact information:   [place] [address] [phone]       BRING ALL MEDICATIONS WITH YOU TO FOLLOW UP APPOINTMENTS  Time spent with patient to  include physician time: 35 minutes Signed: Erma Heritage, PA 08/28/2015, 11:44 AM Co-Sign MD

## 2015-08-28 NOTE — Progress Notes (Signed)
Patient Name: [name] Foley Date of Encounter: 08/28/2015  Active Problems:   CAD (coronary artery disease), native coronary artery   Primary Cardiologist: Dr. Gwenlyn Found Patient Profile: 69 yo female w/ PMH of CAD (s/p DES to LAD 2007, DES to occluded LAD 2013), HTN, HLD, Type 2 DM, and PAD who presented to [name] ED on 08/27/2015 for 2 day history of chest discomfort.  SUBJECTIVE: Denies any recurrence in chest pain or shortness of breath overnight. Reports her pain was worse with lying down prior to admission, but has not had any pain since. Refusing her stress test this AM saying she "has things to do at her home".   OBJECTIVE Filed Vitals:   08/27/15 1945 08/27/15 2145 08/27/15 2221 08/28/15 0526  BP: 118/82 93/49 103/68 87/47  Pulse: 95 94 100 80  Temp:   99 F (37.2 C) 99.4 F (37.4 C)  TempSrc:   Oral Oral  Resp: 16 24 20 20   Height:   5\' 2"  (1.575 m)   Weight:   143 lb 4.8 oz (65 kg)   SpO2: 94% 98% 100% 98%    Intake/Output Summary (Last 24 hours) at 08/28/15 0834 Last data filed at 08/28/15 3009  Gross per 24 hour  Intake    240 ml  Output    200 ml  Net     40 ml   Filed Weights   08/27/15 2221  Weight: 143 lb 4.8 oz (65 kg)    PHYSICAL EXAM General: Well developed, well nourished, female in no acute distress. Head: Normocephalic, atraumatic.  Neck: Supple without bruits, JVD not elevated. Lungs:  Resp regular and unlabored, CTA without wheezing or rales. Heart: RRR, S1, S2, no S3, S4, or murmur; no rub. Abdomen: Soft, non-tender, non-distended with normoactive bowel sounds. No hepatomegaly. No rebound/guarding. No obvious abdominal masses. Extremities: No clubbing, cyanosis, or edema. Distal pedal pulses are 2+ bilaterally. Neuro: Alert and oriented X 3. Moves all extremities spontaneously. Psych: Normal affect.  LABS: CBC: Recent Labs  08/27/15 1755 08/28/15 0348  WBC 5.0 4.0  HGB 10.4* 9.8*  HCT 30.9* 28.4*  MCV 65.5* 66.2*  PLT 197  233   Basic Metabolic Panel: Recent Labs  08/27/15 1755 08/28/15 0348  NA 133* 142  K 3.0* 3.3*  CL 100* 112*  CO2 16* 19*  GLUCOSE 87 91  BUN 6 6  CREATININE 0.93 0.96  CALCIUM 8.4* 8.0*   Cardiac Enzymes: Recent Labs  08/27/15 2235 08/28/15 0348  TROPONINI <0.03 0.03    Recent Labs  08/27/15 1802  TROPIPOC 0.00   TELE: NSR with rate in 70's - 90's. No atopic events.       ECG: NSR with rate in 90's. No acute ST changes since previous tracing.   Radiology/Studies: Dg Chest 2 View: 08/27/2015  CLINICAL DATA:  Short of breath and left-sided chest pain for 2 days. Hypertension. Diabetes. Coronary artery stent. EXAM: CHEST  2 VIEW COMPARISON:  08/11/2012 FINDINGS: Borderline wedging of 2 upper thoracic vertebral bodies, likely chronic compared to the 2011 CT. Midline trachea. Normal heart size. Tortuous thoracic aorta. Coronary artery stent. No pleural effusion or pneumothorax. No congestive failure. Clear lungs. IMPRESSION: No acute cardiopulmonary disease. Electronically Signed   By: Abigail Miyamoto M.D.   On: 08/27/2015 19:08     Current Medications:  . aspirin EC  81 mg Oral Daily  . atorvastatin  40 mg Oral q1800  . clonazePAM  1 mg Oral QHS  .  clopidogrel  75 mg Oral Q breakfast  . heparin  5,000 Units Subcutaneous 3 times per day  . lipase/protease/amylase  24,000 Units Oral TID AC  . metFORMIN  500 mg Oral Q breakfast  . metoprolol tartrate  12.5 mg Oral BID  . mirtazapine  15 mg Oral QHS  . nicotine  14 mg Transdermal QHS  . nitroGLYCERIN  0.5 inch Topical 4 times per day  . pantoprazole  40 mg Oral Daily  . QUEtiapine  25 mg Oral QHS  . regadenoson  0.4 mg Intravenous Once  . sodium chloride  3 mL Intravenous Q12H  . Vitamin D (Ergocalciferol)  50,000 Units Oral Q7 days      ASSESSMENT AND PLAN:  1. Unstable Angina - presented to the ED with 2 day history of worsening chest pain. Reported recent stress and grieving with the loss of 2 sisters within the  last few weeks.  - history of CAD s/p overlapping Taxus DES to the LAD in 2007, anterior STEMI in 2013 with occluded LAD treated with DES.  - scheduled to have Myoview Lexiscan this morning but patient is refusing the test. The procedure, along with the risks and benefits of the procedure were explained in detail to the patient yet she refuses to have the test this morning. She voices understanding of the importance of the test and reports she is willing to have the test as an outpatient but does not want to stay in the hospital for the test. - continue ASA, statin, Plavix, and BB.  2.Type 2 diabetes mellitus - on metformin.  3. Essential hypertension - BP has been 87/47 - 118/82 in the past 24 hours.  4. History of chronic pancreatitis - followed by gastroenterology. - continue home medication regimen  5. PAD  - with known occlusion of the right external iliac - continue to monitor  Outpatient Nuclear Stress Test has been arranged on 09/03/2015 at 7:30 AM.   Signed, Erma Heritage , PA-C 8:34 AM 08/28/2015 Pager: [phone]

## 2015-08-29 NOTE — ED Provider Notes (Signed)
CSN: [id number]     Arrival date & time 08/27/15  1701 History   First MD Initiated Contact with Patient 08/27/15 1705     Chief Complaint  Patient presents with  . Chest Pain     (Consider location/radiation/quality/duration/timing/severity/associated sxs/prior Treatment) Patient is a 69 y.o. female presenting with chest pain.  Chest Pain Pain location:  Substernal area Pain quality: sharp and stabbing   Pain radiates to:  L arm Pain radiates to the back: no   Pain severity:  Moderate Onset quality:  Sudden Duration:  2 days Timing:  Constant Progression:  Partially resolved Chronicity:  Recurrent Context: at rest   Relieved by:  Nothing Worsened by:  Nothing tried Associated symptoms: shortness of breath    [place]  Bouyer is a 69 y.o.F with a pmhx of MI s/p cardiac cath a 2 DES placements on plavix  presenting to the ER complaining of 2 day history of worsening chest discomfort consistent with her previous angina. Pt has a history of angina and states that she is typically able to take her home nitro and her pain is completley relieved. However, this time the nitro did not help which is what brought her to the ED.She states that she has been under a lot of stress, recently lost 2 sisters within the last few weeks. Pt is a cardiology pt of Dr. Gwenlyn Found. She was last seen by him December 2015.  Past Medical History  Diagnosis Date  . Type 2 diabetes mellitus ([place])   . Chronic pancreatitis ([place])   . GERD (gastroesophageal reflux disease)   . Essential hypertension   . History of pneumonia 11/2011  . Alcohol abuse     Abstinent since 2009  . Psoriasis   . Hemorrhoids   . Colon adenoma 03/2012    Diminutive cecal  . ST elevation myocardial infarction (STEMI) of anterior wall ([place]) 07/2012  . Hiatal hernia   . Common bile duct stricture   . GERD (gastroesophageal reflux disease)   . Hyperlipidemia   . Depression   . Fatty liver   . Coronary artery disease     DES x 2 to LAD  2007, DES to LAD 07/2012  . PAD (peripheral artery disease) (HCC)     Occluded right external iliac artery    Past Surgical History  Procedure Laterality Date  . Cholecystectomy  1976  . Abdominal hysterectomy  1994  . Eus  01/05/2012    Procedure: UPPER ENDOSCOPIC ULTRASOUND (EUS) LINEAR;  Surgeon: Milus Banister, MD;  Location: [facility] ENDOSCOPY;  Service: Endoscopy;  Laterality: N/A;  radial linear  . Colonoscopy      multiple  . Ercp  07/25/2012    Procedure: ENDOSCOPIC RETROGRADE CHOLANGIOPANCREATOGRAPHY (ERCP);  Surgeon: Gatha Mayer, MD;  Location: [facility];  Service: Endoscopy;  Laterality: N/A;  with stent  . Esophagogastroduodenoscopy  10/25/2012    Procedure: ESOPHAGOGASTRODUODENOSCOPY (EGD);  Surgeon: Gatha Mayer, MD;  Location: [facility];  Service: Endoscopy;  Laterality: N/A;  EGD with stent removal using ERCP scope  . Hemorrhoid surgery  09/17/2013    [place] hem ligation/pexy  . Transthoracic echocardiogram  04/20/10    PROXIMAL SEPTAL THICKENING IS NOTED. LV SYSTOLIC FUNCTION IS NORMAL. EF= >55%. LEFT ATRIAL SIZE IS NORMAL. RIGHT VENTRICULAR SYSTOLIC PRESSURE IS NORMAL. AV APPEARS TO BE MILDLY SCLEROTIC. NO AS.  Marland Kitchen Stress myocardial perfusion  04/20/10    NORMAL PATTERN OF PERFUSION IN ALL REGIONS. LV SIZE IS NORMAL. EF= 61%. NO SIGN ISCHEMIA  DEMONSTRATED.  Marland Kitchen Left heart cath N/A 08/10/2012    Procedure: LEFT HEART CATH;  Surgeon: Peter M Martinique, MD;  Location: [facility] CATH LAB;  Service: Cardiovascular;  Laterality: N/A;  . Percutaneous coronary stent intervention (pci-s)  08/10/2012    Procedure: PERCUTANEOUS CORONARY STENT INTERVENTION (PCI-S);  Surgeon: Peter M Martinique, MD;  Location: [facility] CATH LAB;  Service: Cardiovascular;;   Family History  Problem Relation Age of Onset  . Malignant hyperthermia Neg Hx   . Colon cancer Mother     Colon resection  . Coronary artery disease Brother   . Renal Disease Brother   . Cirrhosis Father   . Diabetes Mother    Social History    Substance Use Topics  . Smoking status: Former Smoker -- 0.50 packs/day for 15 years    Types: Cigarettes  . Smokeless tobacco: Never Used     Comment: 2 cigarettes a day  . Alcohol Use: No     Comment: Past h/o alcohol abuse, quit 4/09   OB History    No data available     Review of Systems  Respiratory: Positive for shortness of breath.   Cardiovascular: Positive for chest pain.  All other systems reviewed and are negative.     Allergies  Diclofenac sodium and Other  Home Medications   Prior to Admission medications   Medication Sig Start Date End Date Taking? Authorizing Provider  aspirin EC 81 MG EC tablet Take 1 tablet (81 mg total) by mouth daily. 08/13/12  Yes Isaiah Serge, NP  atorvastatin (LIPITOR) 40 MG tablet TAKE ONE (1) TABLET EACH DAY AT 6PM 09/16/13  Yes Lorretta Harp, MD  clonazePAM (KLONOPIN) 1 MG tablet Take 1 tablet (1 mg total) by mouth at bedtime. 07/13/12  Yes Gatha Mayer, MD  clopidogrel (PLAVIX) 75 MG tablet Take 75 mg by mouth daily.     Yes Historical Provider, MD  CREON 24000 UNITS CPEP TAKE 3 CAPSULES 3 TIMES A DAY WITH MEALS AND TAKE ONE CAPSULE WITH SNACKS 11/20/14  Yes Gatha Mayer, MD  metFORMIN (GLUCOPHAGE) 500 MG tablet Take 500 mg by mouth daily.     Yes Historical Provider, MD  metoCLOPramide (REGLAN) 10 MG tablet Take 1 tablet (10 mg total) by mouth every 6 (six) hours as needed (nausea). 08/13/12  Yes Isaiah Serge, NP  mirtazapine (REMERON) 15 MG tablet Take 1 tablet (15 mg total) by mouth at bedtime. 07/13/12  Yes Gatha Mayer, MD  nicotine (NICODERM CQ) 14 mg/24hr patch Place 1 patch onto the skin daily. 04/03/13  Yes Lorretta Harp, MD  nitroGLYCERIN (NITROSTAT) 0.4 MG SL tablet Place 1 tablet (0.4 mg total) under the tongue every 5 (five) minutes x 3 doses as needed for chest pain. 08/13/12  Yes Isaiah Serge, NP  oxyCODONE (OXY IR/ROXICODONE) 5 MG immediate release tablet Take 1-2 tablets (5-10 mg total) by mouth every 6  (six) hours as needed for moderate pain, severe pain or breakthrough pain. 04/16/15  Yes Gatha Mayer, MD  QUEtiapine (SEROQUEL) 25 MG tablet Take 25 mg by mouth at bedtime.   Yes Historical Provider, MD  Vitamin D, Ergocalciferol, (DRISDOL) 50000 UNITS CAPS capsule Take 50,000 Units by mouth every 7 (seven) days. No specific day 09/02/14  Yes Historical Provider, MD  metoprolol tartrate (LOPRESSOR) 25 MG tablet Take 0.5 tablets (12.5 mg total) by mouth 2 (two) times daily. Patient not taking: Reported on 08/27/2015 11/06/14   Lorretta Harp, MD  BP 103/65 mmHg  Pulse 70  Temp(Src) 99.4 F (37.4 C) (Oral)  Resp 20  Ht 5\' 2"  (1.575 m)  Wt 143 lb 4.8 oz (65 kg)  BMI 26.20 kg/m2  SpO2 98% Physical Exam  Constitutional: She is oriented to person, place, and time. She appears well-developed and well-nourished. No distress.  HENT:  Head: Normocephalic and atraumatic.  Mouth/Throat: No oropharyngeal exudate.  Eyes: Conjunctivae and EOM are normal. Pupils are equal, round, and reactive to light. Right eye exhibits no discharge. Left eye exhibits no discharge. No scleral icterus.  Neck: Neck supple.  Cardiovascular: Normal rate, regular rhythm, normal heart sounds and intact distal pulses.  Exam reveals no gallop and no friction rub.   No murmur heard. Pulmonary/Chest: Effort normal and breath sounds normal. No respiratory distress. She has no wheezes. She has no rales. She exhibits tenderness.    Abdominal: Soft. She exhibits no distension. There is no tenderness. There is no guarding.  Musculoskeletal: Normal range of motion. She exhibits no edema.  Lymphadenopathy:    She has no cervical adenopathy.  Neurological: She is alert and oriented to person, place, and time. No cranial nerve deficit.  Strength 5/5 throughout. No sensory deficits.    Skin: Skin is warm and dry. No rash noted. She is not diaphoretic. No erythema. No pallor.  Psychiatric: She has a normal mood and affect. Her  behavior is normal.  Nursing note and vitals reviewed.   ED Course  Procedures (including critical care time) Labs Review Labs Reviewed  BASIC METABOLIC PANEL - Abnormal; Notable for the following:    Sodium 133 (*)    Potassium 3.0 (*)    Chloride 100 (*)    CO2 16 (*)    Calcium 8.4 (*)    Anion gap 17 (*)    All other components within normal limits  CBC - Abnormal; Notable for the following:    Hemoglobin 10.4 (*)    HCT 30.9 (*)    MCV 65.5 (*)    MCH 22.0 (*)    RDW 17.8 (*)    All other components within normal limits  BASIC METABOLIC PANEL - Abnormal; Notable for the following:    Potassium 3.3 (*)    Chloride 112 (*)    CO2 19 (*)    Calcium 8.0 (*)    GFR calc non Af Amer 60 (*)    All other components within normal limits  CBC - Abnormal; Notable for the following:    Hemoglobin 9.8 (*)    HCT 28.4 (*)    MCV 66.2 (*)    MCH 22.8 (*)    RDW 18.3 (*)    All other components within normal limits  TROPONIN I  TROPONIN I  I-STAT TROPOININ, ED    Imaging Review Dg Chest 2 View  08/27/2015  CLINICAL DATA:  Short of breath and left-sided chest pain for 2 days. Hypertension. Diabetes. Coronary artery stent. EXAM: CHEST  2 VIEW COMPARISON:  08/11/2012 FINDINGS: Borderline wedging of 2 upper thoracic vertebral bodies, likely chronic compared to the 2011 CT. Midline trachea. Normal heart size. Tortuous thoracic aorta. Coronary artery stent. No pleural effusion or pneumothorax. No congestive failure. Clear lungs. IMPRESSION: No acute cardiopulmonary disease. Electronically Signed   By: Abigail Miyamoto M.D.   On: 08/27/2015 19:08   I have personally reviewed and evaluated these images and lab results as part of my medical decision-making.   EKG Interpretation   Date/Time:  Thursday August 27 2015  17:10:47 EDT Ventricular Rate:  96 PR Interval:  168 QRS Duration: 85 QT Interval:  357 QTC Calculation: 451 R Axis:   49 Text Interpretation:  Normal sinus rhythm no  acust ST?T changes ST/T  changes from 2013 have resolved Confirmed by GOLDSTON  MD, SCOTT ([id number]) on  08/27/2015 5:17:42 PM      MDM   Final diagnoses:  Precordial pain  CAD (coronary artery disease), native coronary artery   69 y.o F with history of CAD, MI, multiple DES placements on plavix and aspirin presents for chest pain. Pt has history of angina which typically resolves with home nitro however, this episode was unable to be relieved with nitro which brought her to the ED. Associated SOB.  EKG reveals non specific ST-T changes. Initial troponin 0.0. CXR negative. Pt given IV morphine which improved her pain. Pain is not pleuritic in nature. Pt is not hypoxic or tachycardic. Low risk Well's criteria. Low suspicion PE. Pt moderate-high risk for ACS. Pt is cardiology pt of Dr. Gwenlyn Found. Will consult cardiology for admission.  Spoke with cardiology who will admit pt to their service for unstable angina.      Dondra Spry [place], PA-C 08/29/15 1611  Sherwood Gambler, MD 08/31/15 1531

## 2015-09-01 ENCOUNTER — Telehealth (HOSPITAL_COMMUNITY): Payer: Self-pay

## 2015-09-01 NOTE — Telephone Encounter (Signed)
Encounter complete. 

## 2015-09-02 ENCOUNTER — Ambulatory Visit (INDEPENDENT_AMBULATORY_CARE_PROVIDER_SITE_OTHER): Payer: Medicare Other | Admitting: Internal Medicine

## 2015-09-02 ENCOUNTER — Encounter: Payer: Self-pay | Admitting: Internal Medicine

## 2015-09-02 ENCOUNTER — Other Ambulatory Visit: Payer: Self-pay

## 2015-09-02 VITALS — BP 100/70 | HR 60 | Ht 62.0 in | Wt 146.2 lb

## 2015-09-02 DIAGNOSIS — I200 Unstable angina: Secondary | ICD-10-CM | POA: Diagnosis not present

## 2015-09-02 DIAGNOSIS — F4321 Adjustment disorder with depressed mood: Secondary | ICD-10-CM

## 2015-09-02 DIAGNOSIS — G4700 Insomnia, unspecified: Secondary | ICD-10-CM | POA: Diagnosis not present

## 2015-09-02 DIAGNOSIS — K860 Alcohol-induced chronic pancreatitis: Secondary | ICD-10-CM

## 2015-09-02 DIAGNOSIS — R1013 Epigastric pain: Secondary | ICD-10-CM

## 2015-09-02 DIAGNOSIS — M170 Bilateral primary osteoarthritis of knee: Secondary | ICD-10-CM

## 2015-09-02 DIAGNOSIS — G8929 Other chronic pain: Secondary | ICD-10-CM

## 2015-09-02 MED ORDER — CLONAZEPAM 1 MG PO TABS: 1.0000 mg | ORAL_TABLET | Freq: Every day | ORAL | Status: DC

## 2015-09-02 MED ORDER — DICLOFENAC SODIUM 1 % TD GEL: 4.0000 g | Freq: Four times a day (QID) | TRANSDERMAL | Status: DC

## 2015-09-02 MED ORDER — PANCRELIPASE (LIP-PROT-AMYL) 24000-76000 UNITS PO CPEP: ORAL_CAPSULE | ORAL | Status: DC

## 2015-09-02 MED ORDER — NITROGLYCERIN 0.4 MG SL SUBL: 0.4000 mg | SUBLINGUAL_TABLET | SUBLINGUAL | Status: DC | PRN

## 2015-09-02 MED ORDER — OXYCODONE HCL 5 MG PO TABS: 5.0000 mg | ORAL_TABLET | Freq: Four times a day (QID) | ORAL | Status: DC | PRN

## 2015-09-02 NOTE — Patient Instructions (Signed)
  Today you have been given a printed rx for oxy and also klonopin to take to your pharmacy.   We have sent the following medications to your pharmacy for you to pick up at your convenience: Voltaren gel , take this in place of your mobic Creon refilled   Follow up with Dr Carlean Purl in 6 months.    I appreciate the opportunity to care for you. Silvano Rusk, MD, [facility]

## 2015-09-02 NOTE — Progress Notes (Signed)
   Subjective:    Patient ID: [name] Foley, female    DOB: [birth year], 69 y.o.   MRN: [id number] Cc: f/u chronic pancreatitis  HPI Having some increased pain but no diarrhea. Two sisters died about a week apart about 2 weeks ago and she is grieving and not sleeping well despite being on some meds.  Wt Readings from Last 3 Encounters:  09/02/15 146 lb 3.2 oz (66.316 kg)  08/27/15 143 lb 4.8 oz (65 kg)  09/23/14 155 lb (70.308 kg)   Medications, allergies, past medical history, past surgical history, family history and social history are reviewed and updated in the EMR.  Review of Systems As above    Objective:   Physical Exam  BP 100/70 mmHg  Pulse 60  Ht 5\' 2"  (1.575 m)  Wt 146 lb 3.2 oz (66.316 kg)  BMI 26.73 kg/m2 abd soft, NT Lungs cta Heart s1s2 no rmg Depressed mood - tearful at times      Assessment & Plan:  Alcohol-induced chronic pancreatitis (HCC) - Plan: oxyCODONE (OXY IR/ROXICODONE) 5 MG immediate release tablet  Abdominal pain, chronic, epigastric - from chronic pancreatitis - Plan: oxyCODONE (OXY IR/ROXICODONE) 5 MG immediate release tablet  Grief reaction  Insomnia  Osteoarthritis of both knees, unspecified osteoarthritis type   Refill oxycodone, pancreatic enzymes, and Rx clonazepm 1 mg qhs prn insomnia. DC meloxicam given clopidogrel use and ulcer risks - start diclofenac gel.  RTC 6 months, sooner prn  Cc:Philis Fendt, MD

## 2015-09-03 ENCOUNTER — Encounter (HOSPITAL_COMMUNITY): Payer: Medicare Other

## 2015-10-16 ENCOUNTER — Other Ambulatory Visit: Payer: Self-pay | Admitting: Cardiovascular Disease

## 2015-11-17 ENCOUNTER — Telehealth: Payer: Self-pay | Admitting: Internal Medicine

## 2015-11-17 NOTE — Telephone Encounter (Signed)
Patient informed. 

## 2015-11-17 NOTE — Telephone Encounter (Signed)
ok 

## 2015-11-17 NOTE — Telephone Encounter (Signed)
Tried to reach patient, sounded like someone picked up the phone but I couldn't hear them so I hung up and called back twice only to get a busy signal.  I will try back later.

## 2015-11-17 NOTE — Telephone Encounter (Signed)
Her PCP , Dr. Jeanie Cooks gave her prednisone 5 mg rx yesterday to help her appetite and she wants to make sure that is ok with you if she takes it.  Please advise Sir, thanks.

## 2016-01-20 ENCOUNTER — Other Ambulatory Visit: Payer: Self-pay | Admitting: Internal Medicine

## 2016-02-05 ENCOUNTER — Emergency Department (HOSPITAL_COMMUNITY)
Admission: EM | Admit: 2016-02-05 | Discharge: 2016-02-05 | Disposition: A | Discharge status: Home / Self Care | Payer: Medicare Other | Attending: Emergency Medicine | Admitting: Emergency Medicine

## 2016-02-05 ENCOUNTER — Emergency Department (HOSPITAL_COMMUNITY): Payer: Medicare Other

## 2016-02-05 DIAGNOSIS — F329 Major depressive disorder, single episode, unspecified: Secondary | ICD-10-CM | POA: Insufficient documentation

## 2016-02-05 DIAGNOSIS — K219 Gastro-esophageal reflux disease without esophagitis: Secondary | ICD-10-CM | POA: Diagnosis not present

## 2016-02-05 DIAGNOSIS — I252 Old myocardial infarction: Secondary | ICD-10-CM | POA: Insufficient documentation

## 2016-02-05 DIAGNOSIS — Z79899 Other long term (current) drug therapy: Secondary | ICD-10-CM | POA: Insufficient documentation

## 2016-02-05 DIAGNOSIS — Y9389 Activity, other specified: Secondary | ICD-10-CM | POA: Diagnosis not present

## 2016-02-05 DIAGNOSIS — S3992XA Unspecified injury of lower back, initial encounter: Secondary | ICD-10-CM | POA: Diagnosis not present

## 2016-02-05 DIAGNOSIS — Z791 Long term (current) use of non-steroidal anti-inflammatories (NSAID): Secondary | ICD-10-CM | POA: Diagnosis not present

## 2016-02-05 DIAGNOSIS — I10 Essential (primary) hypertension: Secondary | ICD-10-CM | POA: Diagnosis not present

## 2016-02-05 DIAGNOSIS — Z86018 Personal history of other benign neoplasm: Secondary | ICD-10-CM | POA: Diagnosis not present

## 2016-02-05 DIAGNOSIS — I2510 Atherosclerotic heart disease of native coronary artery without angina pectoris: Secondary | ICD-10-CM | POA: Diagnosis not present

## 2016-02-05 DIAGNOSIS — Z8701 Personal history of pneumonia (recurrent): Secondary | ICD-10-CM | POA: Insufficient documentation

## 2016-02-05 DIAGNOSIS — Z872 Personal history of diseases of the skin and subcutaneous tissue: Secondary | ICD-10-CM | POA: Diagnosis not present

## 2016-02-05 DIAGNOSIS — S2232XA Fracture of one rib, left side, initial encounter for closed fracture: Secondary | ICD-10-CM | POA: Insufficient documentation

## 2016-02-05 DIAGNOSIS — Y998 Other external cause status: Secondary | ICD-10-CM | POA: Diagnosis not present

## 2016-02-05 DIAGNOSIS — Z87891 Personal history of nicotine dependence: Secondary | ICD-10-CM | POA: Diagnosis not present

## 2016-02-05 DIAGNOSIS — E119 Type 2 diabetes mellitus without complications: Secondary | ICD-10-CM | POA: Insufficient documentation

## 2016-02-05 DIAGNOSIS — S299XXA Unspecified injury of thorax, initial encounter: Secondary | ICD-10-CM | POA: Diagnosis present

## 2016-02-05 DIAGNOSIS — Z7984 Long term (current) use of oral hypoglycemic drugs: Secondary | ICD-10-CM | POA: Diagnosis not present

## 2016-02-05 DIAGNOSIS — Z9889 Other specified postprocedural states: Secondary | ICD-10-CM | POA: Insufficient documentation

## 2016-02-05 DIAGNOSIS — W1839XA Other fall on same level, initial encounter: Secondary | ICD-10-CM | POA: Diagnosis not present

## 2016-02-05 DIAGNOSIS — Y9289 Other specified places as the place of occurrence of the external cause: Secondary | ICD-10-CM | POA: Diagnosis not present

## 2016-02-05 DIAGNOSIS — E785 Hyperlipidemia, unspecified: Secondary | ICD-10-CM | POA: Insufficient documentation

## 2016-02-05 DIAGNOSIS — Z7902 Long term (current) use of antithrombotics/antiplatelets: Secondary | ICD-10-CM | POA: Diagnosis not present

## 2016-02-05 DIAGNOSIS — S2239XA Fracture of one rib, unspecified side, initial encounter for closed fracture: Secondary | ICD-10-CM

## 2016-02-05 MED ORDER — DIAZEPAM 2 MG PO TABS
2.0000 mg | ORAL_TABLET | Freq: Once | ORAL | Status: AC
  Administered 2016-02-05: 2 mg via ORAL
  Filled 2016-02-05: qty 1

## 2016-02-05 MED ORDER — METHOCARBAMOL 500 MG PO TABS: 500.0000 mg | ORAL_TABLET | Freq: Two times a day (BID) | ORAL | Status: DC

## 2016-02-05 MED ORDER — ACETAMINOPHEN ER 650 MG PO TBCR: 650.0000 mg | EXTENDED_RELEASE_TABLET | Freq: Three times a day (TID) | ORAL | Status: DC | PRN

## 2016-02-05 MED ORDER — HYDROCODONE-ACETAMINOPHEN 5-325 MG PO TABS
1.0000 | ORAL_TABLET | Freq: Once | ORAL | Status: AC
  Administered 2016-02-05: 1 via ORAL
  Filled 2016-02-05: qty 1

## 2016-02-05 MED ORDER — HYDROCODONE-ACETAMINOPHEN 5-325 MG PO TABS: 1.0000 | ORAL_TABLET | Freq: Four times a day (QID) | ORAL | Status: DC | PRN

## 2016-02-05 NOTE — Discharge Instructions (Signed)
You have rib fracture on the left side. Take the meds prescribed. Use the spirometer provided several times through out the day to prevent infection.   Rib Fracture A rib fracture is a break or crack in one of the bones of the ribs. The ribs are a group of long, curved bones that wrap around your chest and attach to your spine. They protect your lungs and other organs in the chest cavity. A broken or cracked rib is often painful, but most do not cause other problems. Most rib fractures heal on their own over time. However, rib fractures can be more serious if multiple ribs are broken or if broken ribs move out of place and push against other structures. CAUSES   A direct blow to the chest. For example, this could happen during contact sports, a car accident, or a fall against a hard object.  Repetitive movements with high force, such as pitching a baseball or having severe coughing spells. SYMPTOMS   Pain when you breathe in or cough.  Pain when someone presses on the injured area. DIAGNOSIS  Your caregiver will perform a physical exam. Various imaging tests may be ordered to confirm the diagnosis and to look for related injuries. These tests may include a chest X-ray, computed tomography (CT), magnetic resonance imaging (MRI), or a bone scan. TREATMENT  Rib fractures usually heal on their own in 1-3 months. The longer healing period is often associated with a continued cough or other aggravating activities. During the healing period, pain control is very important. Medication is usually given to control pain. Hospitalization or surgery may be needed for more severe injuries, such as those in which multiple ribs are broken or the ribs have moved out of place.  HOME CARE INSTRUCTIONS   Avoid strenuous activity and any activities or movements that cause pain. Be careful during activities and avoid bumping the injured rib.  Gradually increase activity as directed by your caregiver.  Only take  over-the-counter or prescription medications as directed by your caregiver. Do not take other medications without asking your caregiver first.  Apply ice to the injured area for the first 1-2 days after you have been treated or as directed by your caregiver. Applying ice helps to reduce inflammation and pain.  Put ice in a plastic bag.  Place a towel between your skin and the bag.   Leave the ice on for 15-20 minutes at a time, every 2 hours while you are awake.  Perform deep breathing as directed by your caregiver. This will help prevent pneumonia, which is a common complication of a broken rib. Your caregiver may instruct you to:  Take deep breaths several times a day.  Try to cough several times a day, holding a pillow against the injured area.  Use a device called an incentive spirometer to practice deep breathing several times a day.  Drink enough fluids to keep your urine clear or pale yellow. This will help you avoid constipation.   Do not wear a rib belt or binder. These restrict breathing, which can lead to pneumonia.  SEEK IMMEDIATE MEDICAL CARE IF:   You have a fever.   You have difficulty breathing or shortness of breath.   You develop a continual cough, or you cough up thick or bloody sputum.  You feel sick to your stomach (nausea), throw up (vomit), or have abdominal pain.   You have worsening pain not controlled with medications.  MAKE SURE YOU:  Understand these instructions.  Will watch your condition.  Will get help right away if you are not doing well or get worse.   This information is not intended to replace advice given to you by your health care provider. Make sure you discuss any questions you have with your health care provider.   Document Released: 10/10/2005 Document Revised: 06/12/2013 Document Reviewed: 12/12/2012 Elsevier Interactive Patient Education [employer].

## 2016-02-05 NOTE — ED Notes (Signed)
Pt arrived by [facility], pt had a fall one week ago and injured left rib area on fall. Denies sob. Airway intact.

## 2016-02-12 NOTE — ED Provider Notes (Signed)
CSN: [id number]     Arrival date & time 02/05/16  1556 History   First MD Initiated Contact with Patient 02/05/16 1650     Chief Complaint  Patient presents with  . Fall  . Rib Injury     (Consider location/radiation/quality/duration/timing/severity/associated sxs/prior Treatment) HPI Comments: Pt comes in with cc of L sided rib pain. Pt had a fall 1 week ago. She has notived that overtime her pain hasnt resolved, and in fact has gotten worse. She reports pain with positions, twisting and breathing. No bloody urine. No anticoagulant use. No new cough.   Patient is a 70 y.o. female presenting with fall. The history is provided by the patient.  Fall Associated symptoms include chest pain. Pertinent negatives include no shortness of breath.    Past Medical History  Diagnosis Date  . Type 2 diabetes mellitus ([place])   . Chronic pancreatitis ([place])   . GERD (gastroesophageal reflux disease)   . Essential hypertension   . History of pneumonia 11/2011  . Alcohol abuse     Abstinent since 2009  . Psoriasis   . Hemorrhoids   . Colon adenoma 03/2012    Diminutive cecal  . ST elevation myocardial infarction (STEMI) of anterior wall ([place]) 07/2012  . Hiatal hernia   . Common bile duct stricture   . GERD (gastroesophageal reflux disease)   . Hyperlipidemia   . Depression   . Fatty liver   . Coronary artery disease     DES x 2 to LAD 2007, DES to LAD 07/2012  . PAD (peripheral artery disease) ([facility])     Occluded right external iliac artery    Past Surgical History  Procedure Laterality Date  . Cholecystectomy  1976  . Abdominal hysterectomy  1994  . Eus  01/05/2012    Procedure: UPPER ENDOSCOPIC ULTRASOUND (EUS) LINEAR;  Surgeon: Milus Banister, MD;  Location: [facility] ENDOSCOPY;  Service: Endoscopy;  Laterality: N/A;  radial linear  . Colonoscopy      multiple  . Ercp  07/25/2012    Procedure: ENDOSCOPIC RETROGRADE CHOLANGIOPANCREATOGRAPHY (ERCP);  Surgeon: Gatha Mayer, MD;  Location: [facility]  ENDOSCOPY;  Service: Endoscopy;  Laterality: N/A;  with stent  . Esophagogastroduodenoscopy  10/25/2012    Procedure: ESOPHAGOGASTRODUODENOSCOPY (EGD);  Surgeon: Gatha Mayer, MD;  Location: [facility] ENDOSCOPY;  Service: Endoscopy;  Laterality: N/A;  EGD with stent removal using ERCP scope  . Hemorrhoid surgery  09/17/2013    Ponder hem ligation/pexy  . Transthoracic echocardiogram  04/20/10    PROXIMAL SEPTAL THICKENING IS NOTED. LV SYSTOLIC FUNCTION IS NORMAL. EF= >55%. LEFT ATRIAL SIZE IS NORMAL. RIGHT VENTRICULAR SYSTOLIC PRESSURE IS NORMAL. AV APPEARS TO BE MILDLY SCLEROTIC. NO AS.  Marland Kitchen Stress myocardial perfusion  04/20/10    NORMAL PATTERN OF PERFUSION IN ALL REGIONS. LV SIZE IS NORMAL. EF= 61%. NO SIGN ISCHEMIA DEMONSTRATED.  Marland Kitchen Left heart cath N/A 08/10/2012    Procedure: LEFT HEART CATH;  Surgeon: Peter M Martinique, MD;  Location[facility] CATH LAB;  Service: Cardiovascular;  Laterality: N/A;  . Percutaneous coronary stent intervention (pci-s)  08/10/2012    Procedure: PERCUTANEOUS CORONARY STENT INTERVENTION (PCI-S);  Surgeon: Peter M Martinique, MD;  Location: [facility] CATH LAB;  Service: Cardiovascular;;   Family History  Problem Relation Age of Onset  . Malignant hyperthermia Neg Hx   . Colon cancer Mother     Colon resection  . Coronary artery disease Brother   . Renal Disease Brother   . Cirrhosis Father   .  Diabetes Mother    Social History  Substance Use Topics  . Smoking status: Former Smoker -- 0.50 packs/day for 15 years    Types: Cigarettes  . Smokeless tobacco: Never Used     Comment: 2 cigarettes a day  . Alcohol Use: No     Comment: Past h/o alcohol abuse, quit 4/09   OB History    No data available     Review of Systems  Respiratory: Negative for shortness of breath.   Cardiovascular: Positive for chest pain.  Musculoskeletal: Positive for back pain.  Skin: Negative for rash and wound.  Hematological: Does not bruise/bleed easily.      Allergies  Diclofenac sodium and  Other  Home Medications   Prior to Admission medications   Medication Sig Start Date End Date Taking? Authorizing Provider  aspirin EC 81 MG EC tablet Take 1 tablet (81 mg total) by mouth daily. 08/13/12  Yes Isaiah Serge, NP  atorvastatin (LIPITOR) 40 MG tablet TAKE ONE (1) TABLET EACH DAY AT 6PM 09/16/13  Yes Lorretta Harp, MD  clonazePAM (KLONOPIN) 1 MG tablet Take 1 tablet (1 mg total) by mouth at bedtime. 09/02/15  Yes Gatha Mayer, MD  clopidogrel (PLAVIX) 75 MG tablet Take 75 mg by mouth daily.     Yes Historical Provider, MD  diclofenac sodium (VOLTAREN) 1 % GEL Apply 4 g topically 4 (four) times daily. 09/02/15  Yes Gatha Mayer, MD  metFORMIN (GLUCOPHAGE) 500 MG tablet Take 500 mg by mouth daily.     Yes Historical Provider, MD  metoprolol tartrate (LOPRESSOR) 25 MG tablet Take 0.5 tablets (12.5 mg total) by mouth 2 (two) times daily. 11/06/14  Yes Lorretta Harp, MD  mirtazapine (REMERON) 15 MG tablet Take 1 tablet (15 mg total) by mouth at bedtime. 07/13/12  Yes Gatha Mayer, MD  nitroGLYCERIN (NITROSTAT) 0.4 MG SL tablet Place 1 tablet (0.4 mg total) under the tongue every 5 (five) minutes x 3 doses as needed for chest pain. 09/02/15  Yes Lorretta Harp, MD  omeprazole (PRILOSEC) 40 MG capsule TAKE ONE CAPSULE BY MOUTH DAILY 01/21/16  Yes Gatha Mayer, MD  oxyCODONE (OXY IR/ROXICODONE) 5 MG immediate release tablet Take 1-2 tablets (5-10 mg total) by mouth every 6 (six) hours as needed for moderate pain, severe pain or breakthrough pain. 09/02/15  Yes Gatha Mayer, MD  Pancrelipase, Lip-Prot-Amyl, (CREON) 24000 UNITS CPEP TAKE 3 CAPSULES 3 TIMES A DAY WITH MEALS AND TAKE ONE CAPSULE WITH SNACKS 09/02/15  Yes Gatha Mayer, MD  QUEtiapine (SEROQUEL) 25 MG tablet Take 25 mg by mouth at bedtime.   Yes Historical Provider, MD  Vitamin D, Ergocalciferol, (DRISDOL) 50000 UNITS CAPS capsule Take 50,000 Units by mouth every 7 (seven) days. No specific day 09/02/14  Yes Historical  Provider, MD  acetaminophen (TYLENOL 8 HOUR) 650 MG CR tablet Take 1 tablet (650 mg total) by mouth every 8 (eight) hours as needed for pain. 02/05/16   Varney Biles, MD  HYDROcodone-acetaminophen (NORCO/VICODIN) 5-325 MG tablet Take 1 tablet by mouth every 6 (six) hours as needed. 02/05/16   Varney Biles, MD  methocarbamol (ROBAXIN) 500 MG tablet Take 1 tablet (500 mg total) by mouth 2 (two) times daily. 02/05/16   Varney Biles, MD  nicotine (NICODERM CQ) 14 mg/24hr patch Place 1 patch onto the skin daily. Patient not taking: Reported on 02/05/2016 04/03/13   Lorretta Harp, MD   BP 134/97 mmHg  Pulse 93  Temp(Src) 98.4 F (36.9 C) (Oral)  Resp 20  Ht 5\' 2"  (1.575 m)  Wt 146 lb (66.225 kg)  BMI 26.70 kg/m2  SpO2 100% Physical Exam  Constitutional: She is oriented to person, place, and time. She appears well-developed.  HENT:  Head: Normocephalic and atraumatic.  Eyes: EOM are normal.  Neck: Normal range of motion. Neck supple.  Cardiovascular: Normal rate.   Pulmonary/Chest: Effort normal. She exhibits tenderness.  Left lower chest wall tenderness  Abdominal: Bowel sounds are normal.  Neurological: She is alert and oriented to person, place, and time.  Skin: Skin is warm and dry. No rash noted.  Nursing note and vitals reviewed.   ED Course  Procedures (including critical care time) Labs Review Labs Reviewed - No data to display  Imaging Review No results found. I have personally reviewed and evaluated these images and lab results as part of my medical decision-making.   EKG Interpretation None      MDM   Final diagnoses:  Rib fracture, unspecified laterality, closed, initial encounter    S/p fall, with L flank pain. Xrays confirm a rib fracture, will d/c with IS.    Varney Biles, MD 02/12/16 1928

## 2016-03-15 ENCOUNTER — Telehealth: Payer: Self-pay | Admitting: Internal Medicine

## 2016-03-15 MED ORDER — POLYETHYLENE GLYCOL 3350 17 GM/SCOOP PO POWD: ORAL | Status: DC

## 2016-03-15 NOTE — Telephone Encounter (Signed)
Patient reports constipation she has been taking castor oil every other day.  She is advised to try Miralax 1 -2 times a day.  I sent her in a rx in hopes it will be more cost effective than purchasing OTC.  She will call back for any additional questions or concerns.

## 2016-05-16 ENCOUNTER — Encounter (HOSPITAL_COMMUNITY): Payer: Self-pay | Admitting: Emergency Medicine

## 2016-05-16 ENCOUNTER — Emergency Department (HOSPITAL_COMMUNITY): Payer: Medicare Other

## 2016-05-16 ENCOUNTER — Emergency Department (HOSPITAL_COMMUNITY)
Admission: EM | Admit: 2016-05-16 | Discharge: 2016-05-17 | Disposition: A | Discharge status: Home / Self Care | Payer: Medicare Other | Attending: Emergency Medicine | Admitting: Emergency Medicine

## 2016-05-16 DIAGNOSIS — R17 Unspecified jaundice: Secondary | ICD-10-CM | POA: Diagnosis not present

## 2016-05-16 DIAGNOSIS — Z7982 Long term (current) use of aspirin: Secondary | ICD-10-CM | POA: Diagnosis not present

## 2016-05-16 DIAGNOSIS — Z7984 Long term (current) use of oral hypoglycemic drugs: Secondary | ICD-10-CM | POA: Insufficient documentation

## 2016-05-16 DIAGNOSIS — Z79891 Long term (current) use of opiate analgesic: Secondary | ICD-10-CM | POA: Insufficient documentation

## 2016-05-16 DIAGNOSIS — E119 Type 2 diabetes mellitus without complications: Secondary | ICD-10-CM | POA: Diagnosis not present

## 2016-05-16 DIAGNOSIS — I252 Old myocardial infarction: Secondary | ICD-10-CM | POA: Insufficient documentation

## 2016-05-16 DIAGNOSIS — I2510 Atherosclerotic heart disease of native coronary artery without angina pectoris: Secondary | ICD-10-CM | POA: Insufficient documentation

## 2016-05-16 DIAGNOSIS — I10 Essential (primary) hypertension: Secondary | ICD-10-CM | POA: Diagnosis not present

## 2016-05-16 DIAGNOSIS — Z79899 Other long term (current) drug therapy: Secondary | ICD-10-CM | POA: Insufficient documentation

## 2016-05-16 DIAGNOSIS — R5383 Other fatigue: Secondary | ICD-10-CM | POA: Diagnosis present

## 2016-05-16 DIAGNOSIS — N390 Urinary tract infection, site not specified: Secondary | ICD-10-CM

## 2016-05-16 LAB — URINALYSIS, ROUTINE W REFLEX MICROSCOPIC
BILIRUBIN URINE: NEGATIVE
Glucose, UA: NEGATIVE mg/dL
Ketones, ur: NEGATIVE mg/dL
NITRITE: POSITIVE — AB
Protein, ur: NEGATIVE mg/dL
SPECIFIC GRAVITY, URINE: 1.005 (ref 1.005–1.030)
pH: 6 (ref 5.0–8.0)

## 2016-05-16 LAB — CBC
HEMATOCRIT: 29.4 % — AB (ref 36.0–46.0)
HEMOGLOBIN: 9.9 g/dL — AB (ref 12.0–15.0)
MCH: 23.7 pg — ABNORMAL LOW (ref 26.0–34.0)
MCHC: 33.7 g/dL (ref 30.0–36.0)
MCV: 70.3 fL — ABNORMAL LOW (ref 78.0–100.0)
Platelets: 234 10*3/uL (ref 150–400)
RBC: 4.18 MIL/uL (ref 3.87–5.11)
RDW: 16.7 % — ABNORMAL HIGH (ref 11.5–15.5)
WBC: 8.6 10*3/uL (ref 4.0–10.5)

## 2016-05-16 LAB — COMPREHENSIVE METABOLIC PANEL
ALBUMIN: 2.4 g/dL — AB (ref 3.5–5.0)
ALT: 32 U/L (ref 14–54)
AST: 85 U/L — AB (ref 15–41)
Alkaline Phosphatase: 244 U/L — ABNORMAL HIGH (ref 38–126)
Anion gap: 10 (ref 5–15)
BILIRUBIN TOTAL: 2 mg/dL — AB (ref 0.3–1.2)
CHLORIDE: 106 mmol/L (ref 101–111)
CO2: 19 mmol/L — AB (ref 22–32)
Calcium: 7.6 mg/dL — ABNORMAL LOW (ref 8.9–10.3)
Creatinine, Ser: 0.84 mg/dL (ref 0.44–1.00)
GFR calc Af Amer: >60 mL/min (ref >60)
GFR calc non Af Amer: >60 mL/min (ref >60)
GLUCOSE: 102 mg/dL — AB (ref 65–99)
POTASSIUM: 3.1 mmol/L — AB (ref 3.5–5.1)
SODIUM: 135 mmol/L (ref 135–145)
Total Protein: 6.9 g/dL (ref 6.5–8.1)

## 2016-05-16 LAB — URINE MICROSCOPIC-ADD ON

## 2016-05-16 LAB — LIPASE, BLOOD: LIPASE: 11 U/L (ref 11–51)

## 2016-05-16 LAB — I-STAT TROPONIN, ED: TROPONIN I, POC: 0 ng/mL (ref 0.00–0.08)

## 2016-05-16 MED ORDER — CEPHALEXIN 500 MG PO CAPS: 500.0000 mg | ORAL_CAPSULE | Freq: Two times a day (BID) | ORAL | 0 refills | Status: DC

## 2016-05-16 MED ORDER — CEPHALEXIN 250 MG PO CAPS
500.0000 mg | ORAL_CAPSULE | Freq: Once | ORAL | Status: AC
  Administered 2016-05-16: 500 mg via ORAL
  Filled 2016-05-16: qty 2

## 2016-05-16 MED ORDER — POTASSIUM CHLORIDE CRYS ER 20 MEQ PO TBCR
40.0000 meq | EXTENDED_RELEASE_TABLET | Freq: Once | ORAL | Status: AC
  Administered 2016-05-16: 40 meq via ORAL
  Filled 2016-05-16: qty 2

## 2016-05-16 NOTE — ED Triage Notes (Signed)
Pt sts fatigue, weight loss and poor appetite x 2 weeks

## 2016-05-16 NOTE — ED Provider Notes (Signed)
[facility] DEPT Provider Note   CSN: [id number] Arrival date & time: 05/16/16  1712  First Provider Contact:  First MD Initiated Contact with Patient 05/16/16 2132        History   Chief Complaint Chief Complaint  Patient presents with  . Fatigue    HPI [name] Foley is a 70 y.o. female.  HPI Pt states for the last couple of weeks she has not had much energy.  Over the weekend she started having a poor appetite.  She has not seen anyone for this until tonight.  No vomiting or diarrhea.  No chest pain or shortness of breath.  NO blood in the stool.  NO dark stools.  No fevers.  Last week she felt like she had a bad cold but those symptoms got better. Past Medical History:  Diagnosis Date  . Alcohol abuse    Abstinent since 2009  . Chronic pancreatitis ([place])   . Colon adenoma 03/2012   Diminutive cecal  . Common bile duct stricture   . Coronary artery disease    DES x 2 to LAD 2007, DES to LAD 07/2012  . Depression   . Essential hypertension   . Fatty liver   . GERD (gastroesophageal reflux disease)   . GERD (gastroesophageal reflux disease)   . Hemorrhoids   . Hiatal hernia   . History of pneumonia 11/2011  . Hyperlipidemia   . PAD (peripheral artery disease) ([facility])    Occluded right external iliac artery   . Psoriasis   . ST elevation myocardial infarction (STEMI) of anterior wall ([place]) 07/2012  . Type 2 diabetes mellitus [facility])     Patient Active Problem List   Diagnosis Date Noted  . Type 2 diabetes mellitus ([place]) 08/28/2015  . CAD (coronary artery disease), native coronary artery 08/27/2015  . Peripheral arterial disease ([place]) 09/23/2014  . Abdominal pain, chronic, epigastric - from chronic pancreatitis 05/26/2014  . Hyperlipidemia   . Pruritus ani 09/24/2012  . Internal hemorrhoids with pain & intermittent prolapse s/p THD hem ligation/pexy 09/17/2013 09/24/2012  . STEMI (ST elevation myocardial infarction), with PCI with DES to mid LAD at site of  previous stent 08/11/2012  . Periumbilical hernia [id number]  . Personal history of colonic polyp-adenoma 04/09/2012  . Chronic diarrhea - ? steatorrhea vs. IBS or both 04/09/2012  . Nasal congestion 04/06/2012  . Cough 04/06/2012  . CAP (community acquired pneumonia) 12/06/2011  . Common bile duct (CBD) stenosis? 12/03/2011  . PANCREATITIS, CHRONIC  08/13/2008  . PSORIASIS 08/13/2008  . ALCOHOL ABUSE, HX OF 03/21/2008  . Hyperlipidemia with target LDL less than 100 01/25/2007  . Coronary atherosclerosis 01/25/2007  . TOBACCO DEPENDENCE 12/21/2006  . HYPERTENSION, BENIGN SYSTEMIC 12/21/2006  . INSOMNIA NOS 12/21/2006    Past Surgical History:  Procedure Laterality Date  . ABDOMINAL HYSTERECTOMY  1994  . CHOLECYSTECTOMY  1976  . COLONOSCOPY     multiple  . ERCP  07/25/2012   Procedure: ENDOSCOPIC RETROGRADE CHOLANGIOPANCREATOGRAPHY (ERCP);  Surgeon: Gatha Mayer, MD;  Location: [facility]OS[facility];  Service: Endoscopy;  Laterality: N/A;  with stent  . ESOPHAGOGASTRODUODENOSCOPY  10/25/2012   Procedure: ESOPHAGOGASTRODUODENOSCOPY (EGD);  Surgeon: Gatha Mayer, MD;  Location: [facility];  Service: Endoscopy;  Laterality: N/A;  EGD with stent removal using ERCP scope  . EUS  01/05/2012   Procedure: UPPER ENDOSCOPIC ULTRASOUND (EUS) LINEAR;  Surgeon: Milus Banister, MD;  Location: [facility] ENDOSCOPY;  Service: Endoscopy;  Laterality: N/A;  radial linear  .  HEMORRHOID SURGERY  09/17/2013   THD hem ligation/pexy  . LEFT HEART CATH N/A 08/10/2012   Procedure: LEFT HEART CATH;  Surgeon: Peter M Martinique, MD;  Location: [facility] CATH LAB;  Service: Cardiovascular;  Laterality: N/A;  . PERCUTANEOUS CORONARY STENT INTERVENTION (PCI-S)  08/10/2012   Procedure: PERCUTANEOUS CORONARY STENT INTERVENTION (PCI-S);  Surgeon: Peter M Martinique, MD;  Location: [facility] CATH LAB;  Service: Cardiovascular;;  . STRESS MYOCARDIAL PERFUSION  04/20/10   NORMAL PATTERN OF PERFUSION IN ALL REGIONS. LV SIZE IS NORMAL. EF= 61%. NO SIGN  ISCHEMIA DEMONSTRATED.  Marland Kitchen TRANSTHORACIC ECHOCARDIOGRAM  04/20/10   PROXIMAL SEPTAL THICKENING IS NOTED. LV SYSTOLIC FUNCTION IS NORMAL. EF= >55%. LEFT ATRIAL SIZE IS NORMAL. RIGHT VENTRICULAR SYSTOLIC PRESSURE IS NORMAL. AV APPEARS TO BE MILDLY SCLEROTIC. NO AS.    OB History    No data available       Home Medications    Prior to Admission medications   Medication Sig Start Date End Date Taking? Authorizing Provider  acetaminophen (TYLENOL 8 HOUR) 650 MG CR tablet Take 1 tablet (650 mg total) by mouth every 8 (eight) hours as needed for pain. 02/05/16   Varney Biles, MD  aspirin EC 81 MG EC tablet Take 1 tablet (81 mg total) by mouth daily. 08/13/12   Isaiah Serge, NP  atorvastatin (LIPITOR) 40 MG tablet TAKE ONE (1) TABLET EACH DAY AT [facility] 09/16/13   Lorretta Harp, MD  cephALEXin (KEFLEX) 500 MG capsule Take 1 capsule (500 mg total) by mouth 2 (two) times daily. 05/16/16   Dorie Rank, MD  clonazePAM (KLONOPIN) 1 MG tablet Take 1 tablet (1 mg total) by mouth at bedtime. 09/02/15   Gatha Mayer, MD  clopidogrel (PLAVIX) 75 MG tablet Take 75 mg by mouth daily.      Historical Provider, MD  diclofenac sodium (VOLTAREN) 1 % GEL Apply 4 g topically 4 (four) times daily. 09/02/15   Gatha Mayer, MD  HYDROcodone-acetaminophen (NORCO/VICODIN) 5-325 MG tablet Take 1 tablet by mouth every 6 (six) hours as needed. 02/05/16   Varney Biles, MD  metFORMIN (GLUCOPHAGE) 500 MG tablet Take 500 mg by mouth daily.      Historical Provider, MD  methocarbamol (ROBAXIN) 500 MG tablet Take 1 tablet (500 mg total) by mouth 2 (two) times daily. 02/05/16   Varney Biles, MD  metoprolol tartrate (LOPRESSOR) 25 MG tablet Take 0.5 tablets (12.5 mg total) by mouth 2 (two) times daily. 11/06/14   Lorretta Harp, MD  mirtazapine (REMERON) 15 MG tablet Take 1 tablet (15 mg total) by mouth at bedtime. 07/13/12   Gatha Mayer, MD  nicotine (NICODERM CQ) 14 mg/24hr patch Place 1 patch onto the skin daily. Patient  not taking: Reported on 02/05/2016 04/03/13   Lorretta Harp, MD  nitroGLYCERIN (NITROSTAT) 0.4 MG SL tablet Place 1 tablet (0.4 mg total) under the tongue every 5 (five) minutes x 3 doses as needed for chest pain. 09/02/15   Lorretta Harp, MD  omeprazole (PRILOSEC) 40 MG capsule TAKE ONE CAPSULE BY MOUTH DAILY 01/21/16   Gatha Mayer, MD  oxyCODONE (OXY IR/ROXICODONE) 5 MG immediate release tablet Take 1-2 tablets (5-10 mg total) by mouth every 6 (six) hours as needed for moderate pain, severe pain or breakthrough pain. 09/02/15   Gatha Mayer, MD  Pancrelipase, Lip-Prot-Amyl, (CREON) 24000 UNITS CPEP TAKE 3 CAPSULES 3 TIMES A DAY WITH MEALS AND TAKE ONE CAPSULE WITH SNACKS 09/02/15   Ofilia Neas  Carlean Purl, MD  polyethylene glycol powder (GLYCOLAX/MIRALAX) powder Take 17 gm mixed in water/juice once or twice a day 03/15/16   Gatha Mayer, MD  QUEtiapine (SEROQUEL) 25 MG tablet Take 25 mg by mouth at bedtime.    Historical Provider, MD  Vitamin D, Ergocalciferol, (DRISDOL) 50000 UNITS CAPS capsule Take 50,000 Units by mouth every 7 (seven) days. No specific day 09/02/14   Historical Provider, MD    Family History Family History  Problem Relation Age of Onset  . Malignant hyperthermia Neg Hx   . Colon cancer Mother     Colon resection  . Coronary artery disease Brother   . Renal Disease Brother   . Cirrhosis Father   . Diabetes Mother     Social History Social History  Substance Use Topics  . Smoking status: Former Smoker    Packs/day: 0.50    Years: 15.00    Types: Cigarettes  . Smokeless tobacco: Never Used     Comment: 2 cigarettes a day  . Alcohol use No     Comment: Past h/o alcohol abuse, quit 4/09     Allergies   Diclofenac sodium and Other   Review of Systems Review of Systems  All other systems reviewed and are negative.    Physical Exam Updated Vital Signs BP (!) 86/67 (BP Location: Right Arm) Comment: pt being moved to room , first nuse notified  Pulse 97    Temp 97.6 F (36.4 C) (Oral)   Resp 16   Ht 5\' 2"  (1.575 m)   Wt 59 kg   SpO2 100%   BMI 23.78 kg/m   Physical Exam  Constitutional: She appears well-developed and well-nourished. No distress.  HENT:  Head: Normocephalic and atraumatic.  Right Ear: External ear normal.  Left Ear: External ear normal.  Eyes: Conjunctivae are normal. Right eye exhibits no discharge. Left eye exhibits no discharge. No scleral icterus.  Neck: Neck supple. No tracheal deviation present.  Cardiovascular: Normal rate, regular rhythm and intact distal pulses.   Pulmonary/Chest: Effort normal and breath sounds normal. No stridor. No respiratory distress. She has no wheezes. She has no rales.  Abdominal: Soft. Bowel sounds are normal. She exhibits no distension. There is no tenderness. There is no rebound and no guarding.  Musculoskeletal: She exhibits no edema or tenderness.  Neurological: She is alert. She has normal strength. No cranial nerve deficit (no facial droop, extraocular movements intact, no slurred speech) or sensory deficit. She exhibits normal muscle tone. She displays no seizure activity. Coordination normal.  Skin: Skin is warm and dry. No rash noted.  Psychiatric: She has a normal mood and affect.  Nursing note and vitals reviewed.    ED Treatments / Results  Labs (all labs ordered are listed, but only abnormal results are displayed) Labs Reviewed  COMPREHENSIVE METABOLIC PANEL - Abnormal; Notable for the following:       Result Value   Potassium 3.1 (*)    CO2 19 (*)    Glucose, Bld 102 (*)    BUN <5 (*)    Calcium 7.6 (*)    Albumin 2.4 (*)    AST 85 (*)    Alkaline Phosphatase 244 (*)    Total Bilirubin 2.0 (*)    All other components within normal limits  CBC - Abnormal; Notable for the following:    Hemoglobin 9.9 (*)    HCT 29.4 (*)    MCV 70.3 (*)    MCH 23.7 (*)  RDW 16.7 (*)    All other components within normal limits  URINALYSIS, ROUTINE W REFLEX MICROSCOPIC  (NOT AT [facility]) - Abnormal; Notable for the following:    APPearance CLOUDY (*)    Hgb urine dipstick SMALL (*)    Nitrite POSITIVE (*)    Leukocytes, UA LARGE (*)    All other components within normal limits  URINE MICROSCOPIC-ADD ON - Abnormal; Notable for the following:    Squamous Epithelial / LPF 0-5 (*)    Bacteria, UA MANY (*)    All other components within normal limits  URINE CULTURE  LIPASE, BLOOD  I-STAT TROPOININ, ED    EKG  EKG Interpretation  Date/Time:  Monday May 16 2016 22:27:13 EDT Ventricular Rate:  92 PR Interval:    QRS Duration: 101 QT Interval:  385 QTC Calculation: 477 R Axis:   53 Text Interpretation:  Sinus rhythm Low voltage, extremity and precordial leads Minimal ST elevation, lateral leads poor data quality Confirmed by Vraj Denardo  MD-J, Xzayvion Vaeth [id number]) on 05/16/2016 10:29:50 PM       Radiology Dg Chest 2 View  Result Date: 05/16/2016 CLINICAL DATA:  Fatigue and weight loss EXAM: CHEST  2 VIEW COMPARISON:  02/05/2016 FINDINGS: The heart size and mediastinal contours are within normal limits. Both lungs are clear. The visualized skeletal structures are unremarkable. Coronary stent is again seen. IMPRESSION: No active cardiopulmonary disease. Electronically Signed   By: Inez Catalina M.D.   On: 05/16/2016 22:29   Procedures Procedures (including critical care time)  Medications Ordered in ED Medications  cephALEXin (KEFLEX) capsule 500 mg (not administered)  potassium chloride SA (K-DUR,KLOR-CON) CR tablet 40 mEq (40 mEq Oral Given 05/16/16 2214)     Initial Impression / Assessment and Plan / ED Course  I have reviewed the triage vital signs and the nursing notes.  Pertinent labs & imaging results that were available during my care of the patient were reviewed by me and considered in my medical decision making (see chart for details).  Clinical Course  Comment By Time  Anemia is stable.  Mild electrolyte abnormalities but doubt it would cause her  fatigue Dorie Rank, MD 07/24 2144    Patient's laboratory tests are notable for a urinary tract infection. I discussed the urine abnormality she mentioned that she has had some discomfort with urination. I suspect this is the cause of her fatigue. Patient also does have an elevation in her bilirubin. Not having any abdominal tenderness but I do think she needs to have this rechecked with her primary doctor. I have asked her to follow up with her doctor next week.  Final Clinical Impressions(s) / ED Diagnoses   Final diagnoses:  UTI (lower urinary tract infection)    New Prescriptions New Prescriptions   CEPHALEXIN (KEFLEX) 500 MG CAPSULE    Take 1 capsule (500 mg total) by mouth 2 (two) times daily.     Dorie Rank, MD 05/16/16 2329

## 2016-05-19 LAB — URINE CULTURE

## 2016-05-20 ENCOUNTER — Telehealth (HOSPITAL_BASED_OUTPATIENT_CLINIC_OR_DEPARTMENT_OTHER): Payer: Self-pay | Admitting: *Deleted

## 2016-05-20 NOTE — Telephone Encounter (Signed)
Positive urine culture treated with  Cefazolin no change needed per Milus Glazier PharmD.

## 2016-06-08 ENCOUNTER — Encounter (HOSPITAL_COMMUNITY): Payer: Self-pay | Admitting: *Deleted

## 2016-06-08 ENCOUNTER — Observation Stay (HOSPITAL_COMMUNITY): Payer: Medicare Other

## 2016-06-08 ENCOUNTER — Emergency Department (HOSPITAL_COMMUNITY): Payer: Medicare Other

## 2016-06-08 ENCOUNTER — Inpatient Hospital Stay (HOSPITAL_COMMUNITY)
Admission: EM | Admit: 2016-06-08 | Discharge: 2016-06-10 | DRG: 439 | Discharge status: Against Medical Advice | Payer: Medicare Other | Attending: Internal Medicine | Admitting: Internal Medicine

## 2016-06-08 DIAGNOSIS — R1013 Epigastric pain: Secondary | ICD-10-CM | POA: Diagnosis not present

## 2016-06-08 DIAGNOSIS — R52 Pain, unspecified: Secondary | ICD-10-CM

## 2016-06-08 DIAGNOSIS — I2510 Atherosclerotic heart disease of native coronary artery without angina pectoris: Secondary | ICD-10-CM | POA: Diagnosis not present

## 2016-06-08 DIAGNOSIS — I10 Essential (primary) hypertension: Secondary | ICD-10-CM | POA: Diagnosis present

## 2016-06-08 DIAGNOSIS — N179 Acute kidney failure, unspecified: Secondary | ICD-10-CM | POA: Diagnosis present

## 2016-06-08 DIAGNOSIS — D509 Iron deficiency anemia, unspecified: Secondary | ICD-10-CM | POA: Diagnosis present

## 2016-06-08 DIAGNOSIS — E1151 Type 2 diabetes mellitus with diabetic peripheral angiopathy without gangrene: Secondary | ICD-10-CM | POA: Diagnosis present

## 2016-06-08 DIAGNOSIS — N189 Chronic kidney disease, unspecified: Secondary | ICD-10-CM | POA: Diagnosis present

## 2016-06-08 DIAGNOSIS — K429 Umbilical hernia without obstruction or gangrene: Secondary | ICD-10-CM | POA: Diagnosis present

## 2016-06-08 DIAGNOSIS — K860 Alcohol-induced chronic pancreatitis: Secondary | ICD-10-CM | POA: Diagnosis present

## 2016-06-08 DIAGNOSIS — Z87891 Personal history of nicotine dependence: Secondary | ICD-10-CM

## 2016-06-08 DIAGNOSIS — E860 Dehydration: Secondary | ICD-10-CM | POA: Diagnosis present

## 2016-06-08 DIAGNOSIS — K859 Acute pancreatitis without necrosis or infection, unspecified: Secondary | ICD-10-CM | POA: Diagnosis not present

## 2016-06-08 DIAGNOSIS — F1021 Alcohol dependence, in remission: Secondary | ICD-10-CM

## 2016-06-08 DIAGNOSIS — R1084 Generalized abdominal pain: Secondary | ICD-10-CM | POA: Diagnosis not present

## 2016-06-08 DIAGNOSIS — Z7902 Long term (current) use of antithrombotics/antiplatelets: Secondary | ICD-10-CM

## 2016-06-08 DIAGNOSIS — F329 Major depressive disorder, single episode, unspecified: Secondary | ICD-10-CM | POA: Diagnosis present

## 2016-06-08 DIAGNOSIS — Z7982 Long term (current) use of aspirin: Secondary | ICD-10-CM

## 2016-06-08 DIAGNOSIS — E1122 Type 2 diabetes mellitus with diabetic chronic kidney disease: Secondary | ICD-10-CM | POA: Diagnosis present

## 2016-06-08 DIAGNOSIS — A599 Trichomoniasis, unspecified: Secondary | ICD-10-CM | POA: Diagnosis present

## 2016-06-08 DIAGNOSIS — N390 Urinary tract infection, site not specified: Secondary | ICD-10-CM | POA: Diagnosis present

## 2016-06-08 DIAGNOSIS — I959 Hypotension, unspecified: Secondary | ICD-10-CM | POA: Diagnosis present

## 2016-06-08 DIAGNOSIS — F1010 Alcohol abuse, uncomplicated: Secondary | ICD-10-CM | POA: Diagnosis present

## 2016-06-08 DIAGNOSIS — G8929 Other chronic pain: Secondary | ICD-10-CM | POA: Diagnosis present

## 2016-06-08 DIAGNOSIS — L409 Psoriasis, unspecified: Secondary | ICD-10-CM | POA: Diagnosis present

## 2016-06-08 DIAGNOSIS — E785 Hyperlipidemia, unspecified: Secondary | ICD-10-CM | POA: Diagnosis present

## 2016-06-08 DIAGNOSIS — K861 Other chronic pancreatitis: Secondary | ICD-10-CM | POA: Diagnosis present

## 2016-06-08 DIAGNOSIS — E871 Hypo-osmolality and hyponatremia: Secondary | ICD-10-CM | POA: Diagnosis present

## 2016-06-08 DIAGNOSIS — I739 Peripheral vascular disease, unspecified: Secondary | ICD-10-CM

## 2016-06-08 DIAGNOSIS — I129 Hypertensive chronic kidney disease with stage 1 through stage 4 chronic kidney disease, or unspecified chronic kidney disease: Secondary | ICD-10-CM | POA: Diagnosis present

## 2016-06-08 DIAGNOSIS — K219 Gastro-esophageal reflux disease without esophagitis: Secondary | ICD-10-CM | POA: Diagnosis present

## 2016-06-08 DIAGNOSIS — Z23 Encounter for immunization: Secondary | ICD-10-CM

## 2016-06-08 DIAGNOSIS — I252 Old myocardial infarction: Secondary | ICD-10-CM

## 2016-06-08 LAB — CBC
HEMATOCRIT: 25.2 % — AB (ref 36.0–46.0)
Hemoglobin: 8.3 g/dL — ABNORMAL LOW (ref 12.0–15.0)
MCH: 22.8 pg — ABNORMAL LOW (ref 26.0–34.0)
MCHC: 32.9 g/dL (ref 30.0–36.0)
MCV: 69.2 fL — AB (ref 78.0–100.0)
Platelets: 210 10*3/uL (ref 150–400)
RBC: 3.64 MIL/uL — AB (ref 3.87–5.11)
RDW: 16.3 % — ABNORMAL HIGH (ref 11.5–15.5)
WBC: 7.1 10*3/uL (ref 4.0–10.5)

## 2016-06-08 LAB — URINALYSIS, ROUTINE W REFLEX MICROSCOPIC
GLUCOSE, UA: NEGATIVE mg/dL
Ketones, ur: NEGATIVE mg/dL
Nitrite: NEGATIVE
PH: 6 (ref 5.0–8.0)
PROTEIN: NEGATIVE mg/dL
Specific Gravity, Urine: 1.011 (ref 1.005–1.030)

## 2016-06-08 LAB — FOLATE: Folate: 14.9 ng/mL (ref >5.9)

## 2016-06-08 LAB — I-STAT TROPONIN, ED: Troponin i, poc: 0 ng/mL (ref 0.00–0.08)

## 2016-06-08 LAB — COMPREHENSIVE METABOLIC PANEL
ALT: 23 U/L (ref 14–54)
ANION GAP: 10 (ref 5–15)
AST: 63 U/L — ABNORMAL HIGH (ref 15–41)
Albumin: 1.9 g/dL — ABNORMAL LOW (ref 3.5–5.0)
Alkaline Phosphatase: 168 U/L — ABNORMAL HIGH (ref 38–126)
BUN: 6 mg/dL (ref 6–20)
CHLORIDE: 105 mmol/L (ref 101–111)
CO2: 17 mmol/L — ABNORMAL LOW (ref 22–32)
Calcium: 7.5 mg/dL — ABNORMAL LOW (ref 8.9–10.3)
Creatinine, Ser: 1.27 mg/dL — ABNORMAL HIGH (ref 0.44–1.00)
GFR, EST AFRICAN AMERICAN: 49 mL/min — AB (ref >60)
GFR, EST NON AFRICAN AMERICAN: 42 mL/min — AB (ref >60)
Glucose, Bld: 91 mg/dL (ref 65–99)
POTASSIUM: 4 mmol/L (ref 3.5–5.1)
Sodium: 132 mmol/L — ABNORMAL LOW (ref 135–145)
Total Bilirubin: 4 mg/dL — ABNORMAL HIGH (ref 0.3–1.2)
Total Protein: 6.4 g/dL — ABNORMAL LOW (ref 6.5–8.1)

## 2016-06-08 LAB — URINE MICROSCOPIC-ADD ON: BACTERIA UA: NONE SEEN

## 2016-06-08 LAB — I-STAT CG4 LACTIC ACID, ED: Lactic Acid, Venous: 2.33 mmol/L (ref 0.5–1.9)

## 2016-06-08 LAB — RETICULOCYTES
RBC.: 4.02 MIL/uL (ref 3.87–5.11)
RETIC COUNT ABSOLUTE: 40.2 10*3/uL (ref 19.0–186.0)
Retic Ct Pct: 1 % (ref 0.4–3.1)

## 2016-06-08 LAB — IRON AND TIBC: IRON: 61 ug/dL (ref 28–170)

## 2016-06-08 LAB — VITAMIN B12: Vitamin B-12: 1955 pg/mL — ABNORMAL HIGH (ref 180–914)

## 2016-06-08 LAB — TROPONIN I: Troponin I: <0.03 ng/mL (ref <0.03)

## 2016-06-08 LAB — CBG MONITORING, ED: Glucose-Capillary: 84 mg/dL (ref 65–99)

## 2016-06-08 LAB — LIPASE, BLOOD: LIPASE: 11 U/L (ref 11–51)

## 2016-06-08 LAB — FERRITIN: FERRITIN: 627 ng/mL — AB (ref 11–307)

## 2016-06-08 MED ORDER — CLONAZEPAM 1 MG PO TABS
1.0000 mg | ORAL_TABLET | Freq: Every day | ORAL | Status: DC
  Administered 2016-06-08 – 2016-06-09 (×2): 1 mg via ORAL
  Filled 2016-06-08 (×2): qty 1

## 2016-06-08 MED ORDER — DICLOFENAC SODIUM 1 % TD GEL
4.0000 g | Freq: Two times a day (BID) | TRANSDERMAL | Status: DC | PRN
  Filled 2016-06-08: qty 100

## 2016-06-08 MED ORDER — QUETIAPINE FUMARATE 50 MG PO TABS
50.0000 mg | ORAL_TABLET | Freq: Every day | ORAL | Status: DC
  Administered 2016-06-08 – 2016-06-09 (×2): 50 mg via ORAL
  Filled 2016-06-08 (×2): qty 1

## 2016-06-08 MED ORDER — ONDANSETRON HCL 4 MG/2ML IJ SOLN: 4.0000 mg | Freq: Four times a day (QID) | INTRAMUSCULAR | Status: DC | PRN

## 2016-06-08 MED ORDER — ONDANSETRON HCL 4 MG PO TABS: 4.0000 mg | ORAL_TABLET | Freq: Four times a day (QID) | ORAL | Status: DC | PRN

## 2016-06-08 MED ORDER — DEXTROSE 5 % IV SOLN
1.0000 g | INTRAVENOUS | Status: DC
  Administered 2016-06-08 – 2016-06-09 (×2): 1 g via INTRAVENOUS
  Filled 2016-06-08 (×3): qty 10

## 2016-06-08 MED ORDER — SENNOSIDES-DOCUSATE SODIUM 8.6-50 MG PO TABS: 1.0000 | ORAL_TABLET | Freq: Every evening | ORAL | Status: DC | PRN

## 2016-06-08 MED ORDER — CLOPIDOGREL BISULFATE 75 MG PO TABS
75.0000 mg | ORAL_TABLET | Freq: Every day | ORAL | Status: DC
  Administered 2016-06-09 – 2016-06-10 (×2): 75 mg via ORAL
  Filled 2016-06-08 (×2): qty 1

## 2016-06-08 MED ORDER — NICOTINE 14 MG/24HR TD PT24
14.0000 mg | MEDICATED_PATCH | TRANSDERMAL | Status: DC
  Administered 2016-06-08 – 2016-06-09 (×2): 14 mg via TRANSDERMAL
  Filled 2016-06-08 (×2): qty 1

## 2016-06-08 MED ORDER — WHITE PETROLATUM GEL
Status: AC
  Administered 2016-06-08: 0.2
  Filled 2016-06-08: qty 1

## 2016-06-08 MED ORDER — FENTANYL CITRATE (PF) 100 MCG/2ML IJ SOLN
50.0000 ug | Freq: Once | INTRAMUSCULAR | Status: AC
  Administered 2016-06-08: 50 ug via INTRAVENOUS
  Filled 2016-06-08: qty 2

## 2016-06-08 MED ORDER — PANTOPRAZOLE SODIUM 40 MG PO TBEC
40.0000 mg | DELAYED_RELEASE_TABLET | Freq: Every day | ORAL | Status: DC
  Administered 2016-06-08 – 2016-06-10 (×3): 40 mg via ORAL
  Filled 2016-06-08 (×3): qty 1

## 2016-06-08 MED ORDER — SODIUM CHLORIDE 0.9 % IV BOLUS (SEPSIS)
1000.0000 mL | Freq: Once | INTRAVENOUS | Status: AC
  Administered 2016-06-08: 1000 mL via INTRAVENOUS

## 2016-06-08 MED ORDER — MIRTAZAPINE 15 MG PO TABS
15.0000 mg | ORAL_TABLET | Freq: Every day | ORAL | Status: DC
  Administered 2016-06-08 – 2016-06-09 (×2): 15 mg via ORAL
  Filled 2016-06-08 (×2): qty 1

## 2016-06-08 MED ORDER — SODIUM CHLORIDE 0.9 % IV BOLUS (SEPSIS): 1000.0000 mL | Freq: Once | INTRAVENOUS | Status: DC

## 2016-06-08 MED ORDER — ENOXAPARIN SODIUM 40 MG/0.4ML ~~LOC~~ SOLN
40.0000 mg | SUBCUTANEOUS | Status: DC
  Administered 2016-06-08 – 2016-06-09 (×2): 40 mg via SUBCUTANEOUS
  Filled 2016-06-08 (×2): qty 0.4

## 2016-06-08 MED ORDER — ATORVASTATIN CALCIUM 40 MG PO TABS
40.0000 mg | ORAL_TABLET | Freq: Every day | ORAL | Status: DC
  Administered 2016-06-09: 40 mg via ORAL
  Filled 2016-06-08: qty 1

## 2016-06-08 MED ORDER — SODIUM CHLORIDE 0.9 % IV SOLN
INTRAVENOUS | Status: DC
  Administered 2016-06-08 – 2016-06-09 (×2): via INTRAVENOUS

## 2016-06-08 MED ORDER — IOPAMIDOL (ISOVUE-300) INJECTION 61%
INTRAVENOUS | Status: AC
  Administered 2016-06-08: 80 mL
  Filled 2016-06-08: qty 100

## 2016-06-08 MED ORDER — METRONIDAZOLE 500 MG PO TABS
500.0000 mg | ORAL_TABLET | Freq: Once | ORAL | Status: AC
  Administered 2016-06-08: 500 mg via ORAL
  Filled 2016-06-08: qty 1

## 2016-06-08 MED ORDER — MORPHINE SULFATE (PF) 2 MG/ML IV SOLN
2.0000 mg | INTRAVENOUS | Status: DC | PRN
  Administered 2016-06-08 – 2016-06-09 (×5): 2 mg via INTRAVENOUS
  Filled 2016-06-08 (×5): qty 1

## 2016-06-08 MED ORDER — ASPIRIN EC 81 MG PO TBEC
81.0000 mg | DELAYED_RELEASE_TABLET | Freq: Every day | ORAL | Status: DC
  Administered 2016-06-09 – 2016-06-10 (×2): 81 mg via ORAL
  Filled 2016-06-08 (×4): qty 1

## 2016-06-08 NOTE — ED Notes (Signed)
Patient undressed, in gown, on monitor, continuous pulse oximetry and blood pressure cuff 

## 2016-06-08 NOTE — ED Triage Notes (Signed)
Pt reports abd pain, hx of pancreatitis. Having bodyaches and fatigue for several days. Pt is hypotensive at triage.

## 2016-06-08 NOTE — ED Provider Notes (Signed)
[facility] DEPT Provider Note   CSN: [id number] Arrival date & time: 06/08/16  0902     History   Chief Complaint Chief Complaint  Patient presents with  . Weakness    HPI [place] F Desouza is a 70 y.o. female.  The history is provided by the patient.  Abdominal Pain   This is a recurrent problem. The current episode started more than 2 days ago. The problem occurs constantly. The problem has been gradually worsening. The pain is associated with eating. The pain is located in the generalized abdominal region. The quality of the pain is dull, sharp and shooting. The pain is at a severity of 7/10. The pain is moderate. Associated symptoms include anorexia and nausea. Pertinent negatives include diarrhea. The symptoms are aggravated by eating. Nothing relieves the symptoms. Past workup includes GI consult. Past medical history comments: pancreatitis.    Past Medical History:  Diagnosis Date  . Alcohol abuse    Abstinent since 2009  . Chronic pancreatitis ([place])   . Colon adenoma 03/2012   Diminutive cecal  . Common bile duct stricture   . Coronary artery disease    DES x 2 to LAD 2007, DES to LAD 07/2012  . Depression   . Essential hypertension   . Fatty liver   . GERD (gastroesophageal reflux disease)   . GERD (gastroesophageal reflux disease)   . Hemorrhoids   . Hiatal hernia   . History of pneumonia 11/2011  . Hyperlipidemia   . PAD (peripheral artery disease) (HCC)    Occluded right external iliac artery   . Psoriasis   . ST elevation myocardial infarction (STEMI) of anterior wall ([place]) 07/2012  . Type 2 diabetes mellitus [facility])     Patient Active Problem List   Diagnosis Date Noted  . Pancreatitis 06/08/2016  . Type 2 diabetes mellitus ([place]) 08/28/2015  . CAD (coronary artery disease), native coronary artery 08/27/2015  . Peripheral arterial disease ([place]) 09/23/2014  . Abdominal pain, chronic, epigastric - from chronic pancreatitis 05/26/2014  . Hyperlipidemia    . Pruritus ani 09/24/2012  . Internal hemorrhoids with pain & intermittent prolapse s/p THD hem ligation/pexy 09/17/2013 09/24/2012  . STEMI (ST elevation myocardial infarction), with PCI with DES to mid LAD at site of previous stent 08/11/2012  . Periumbilical hernia [id number]  . Personal history of colonic polyp-adenoma 04/09/2012  . Chronic diarrhea - ? steatorrhea vs. IBS or both 04/09/2012  . Nasal congestion 04/06/2012  . Cough 04/06/2012  . CAP (community acquired pneumonia) 12/06/2011  . Common bile duct (CBD) stenosis? 12/03/2011  . PANCREATITIS, CHRONIC  08/13/2008  . PSORIASIS 08/13/2008  . ALCOHOL ABUSE, HX OF 03/21/2008  . Hyperlipidemia with target LDL less than 100 01/25/2007  . Coronary atherosclerosis 01/25/2007  . TOBACCO DEPENDENCE 12/21/2006  . HYPERTENSION, BENIGN SYSTEMIC 12/21/2006  . INSOMNIA NOS 12/21/2006    Past Surgical History:  Procedure Laterality Date  . ABDOMINAL HYSTERECTOMY  1994  . CHOLECYSTECTOMY  1976  . COLONOSCOPY     multiple  . ERCP  07/25/2012   Procedure: ENDOSCOPIC RETROGRADE CHOLANGIOPANCREATOGRAPHY (ERCP);  Surgeon: Gatha Mayer, MD;  Location: [facility];  Service: Endoscopy;  Laterality: N/A;  with stent  . ESOPHAGOGASTRODUODENOSCOPY  10/25/2012   Procedure: ESOPHAGOGASTRODUODENOSCOPY (EGD);  Surgeon: Gatha Mayer, MD;  Location: [facility];  Service: Endoscopy;  Laterality: N/A;  EGD with stent removal using ERCP scope  . EUS  01/05/2012   Procedure: UPPER ENDOSCOPIC ULTRASOUND (EUS) LINEAR;  Surgeon: Melene Plan  Ardis Hughs, MD;  Location: [facility];  Service: Endoscopy;  Laterality: N/A;  radial linear  . HEMORRHOID SURGERY  09/17/2013   THD hem ligation/pexy  . LEFT HEART CATH N/A 08/10/2012   Procedure: LEFT HEART CATH;  Surgeon: Peter M Martinique, MD;  Location: [facility] CATH LAB;  Service: Cardiovascular;  Laterality: N/A;  . PERCUTANEOUS CORONARY STENT INTERVENTION (PCI-S)  08/10/2012   Procedure: PERCUTANEOUS CORONARY STENT  INTERVENTION (PCI-S);  Surgeon: Peter M Martinique, MD;  Location: [facility] CATH LAB;  Service: Cardiovascular;;  . STRESS MYOCARDIAL PERFUSION  04/20/10   NORMAL PATTERN OF PERFUSION IN ALL REGIONS. LV SIZE IS NORMAL. EF= 61%. NO SIGN ISCHEMIA DEMONSTRATED.  Marland Kitchen TRANSTHORACIC ECHOCARDIOGRAM  04/20/10   PROXIMAL SEPTAL THICKENING IS NOTED. LV SYSTOLIC FUNCTION IS NORMAL. EF= >55%. LEFT ATRIAL SIZE IS NORMAL. RIGHT VENTRICULAR SYSTOLIC PRESSURE IS NORMAL. AV APPEARS TO BE MILDLY SCLEROTIC. NO AS.    OB History    No data available       Home Medications    Prior to Admission medications   Medication Sig Start Date End Date Taking? Authorizing Provider  acetaminophen (TYLENOL 8 HOUR) 650 MG CR tablet Take 1 tablet (650 mg total) by mouth every 8 (eight) hours as needed for pain. 02/05/16  Yes Varney Biles, MD  aspirin EC 81 MG EC tablet Take 1 tablet (81 mg total) by mouth daily. 08/13/12  Yes Isaiah Serge, NP  atorvastatin (LIPITOR) 40 MG tablet TAKE ONE (1) TABLET EACH DAY AT 6PM Patient taking differently: Take 40 mg by mouth each day 09/16/13  Yes Lorretta Harp, MD  clonazePAM (KLONOPIN) 1 MG tablet Take 1 tablet (1 mg total) by mouth at bedtime. 09/02/15  Yes Gatha Mayer, MD  clopidogrel (PLAVIX) 75 MG tablet Take 75 mg by mouth daily.     Yes Historical Provider, MD  diclofenac sodium (VOLTAREN) 1 % GEL Apply 4 g topically 4 (four) times daily. Patient taking differently: Apply 4 g topically 2 (two) times daily as needed (for pain).  09/02/15  Yes Gatha Mayer, MD  losartan (COZAAR) 25 MG tablet Take 25 mg by mouth daily.   Yes Historical Provider, MD  mirtazapine (REMERON) 15 MG tablet Take 1 tablet (15 mg total) by mouth at bedtime. 07/13/12  Yes Gatha Mayer, MD  omeprazole (PRILOSEC) 40 MG capsule TAKE ONE CAPSULE BY MOUTH DAILY Patient taking differently: TAKE 40 MG BY MOUTH DAILY 01/21/16  Yes Gatha Mayer, MD  Pancrelipase, Lip-Prot-Amyl, (CREON) 24000 UNITS CPEP TAKE 3 CAPSULES  3 TIMES A DAY WITH MEALS AND TAKE ONE CAPSULE WITH SNACKS Patient taking differently: Take 24,000-72,000 Units by mouth See admin instructions. Take 72000 units by mouth 3 times daily with meals and take 24000 units by mouth with snack. 09/02/15  Yes Gatha Mayer, MD  polyethylene glycol [facility] / GLYCOLAX) packet Take 17 g by mouth daily as needed for moderate constipation.   Yes Historical Provider, MD  QUEtiapine (SEROQUEL) 50 MG tablet Take 50 mg by mouth at bedtime.   Yes Historical Provider, MD  Vitamin D, Ergocalciferol, (DRISDOL) 50000 UNITS CAPS capsule Take 50,000 Units by mouth every 7 (seven) days. No specific day 09/02/14  Yes Historical Provider, MD  cephALEXin (KEFLEX) 500 MG capsule Take 1 capsule (500 mg total) by mouth 2 (two) times daily. Patient not taking: Reported on 06/08/2016 05/16/16   Dorie Rank, MD  HYDROcodone-acetaminophen (NORCO/VICODIN) 5-325 MG tablet Take 1 tablet by mouth every 6 (six) hours as  needed. Patient not taking: Reported on 06/08/2016 02/05/16   Varney Biles, MD  methocarbamol (ROBAXIN) 500 MG tablet Take 1 tablet (500 mg total) by mouth 2 (two) times daily. Patient not taking: Reported on 06/08/2016 02/05/16   Varney Biles, MD  nicotine (NICODERM CQ) 14 mg/24hr patch Place 1 patch onto the skin daily. Patient not taking: Reported on 02/05/2016 04/03/13   Lorretta Harp, MD  nitroGLYCERIN (NITROSTAT) 0.4 MG SL tablet Place 1 tablet (0.4 mg total) under the tongue every 5 (five) minutes x 3 doses as needed for chest pain. 09/02/15   Lorretta Harp, MD  oxyCODONE (OXY IR/ROXICODONE) 5 MG immediate release tablet Take 1-2 tablets (5-10 mg total) by mouth every 6 (six) hours as needed for moderate pain, severe pain or breakthrough pain. Patient not taking: Reported on 06/08/2016 09/02/15   Gatha Mayer, MD    Family History Family History  Problem Relation Age of Onset  . Colon cancer Mother     Colon resection  . Diabetes Mother   . Cirrhosis Father     . Coronary artery disease Brother   . Renal Disease Brother   . Malignant hyperthermia Neg Hx     Social History Social History  Substance Use Topics  . Smoking status: Former Smoker    Packs/day: 0.50    Years: 15.00    Types: Cigarettes  . Smokeless tobacco: Never Used     Comment: 2 cigarettes a day  . Alcohol use No     Comment: Past h/o alcohol abuse, quit 4/09     Allergies   Diclofenac sodium and Other   Review of Systems Review of Systems  Constitutional: Positive for fatigue. Negative for activity change.  Respiratory: Negative for shortness of breath.   Cardiovascular: Negative for chest pain.  Gastrointestinal: Positive for abdominal pain, anorexia and nausea. Negative for diarrhea.  All other systems reviewed and are negative.    Physical Exam Updated Vital Signs BP 112/78 (BP Location: Right Arm)   Pulse 75   Temp 98.3 F (36.8 C) (Oral)   Resp 17   Ht 5\' 2"  (1.575 m)   Wt 136 lb (61.7 kg)   SpO2 98%   BMI 24.87 kg/m   Physical Exam  Constitutional: She is oriented to person, place, and time. She appears well-developed and well-nourished.  Pale 70 year old female  HENT:  Head: Normocephalic and atraumatic.  Dry mucous membranes. Edentulous.  Eyes: Right eye exhibits no discharge.  Cardiovascular: Normal rate.   Pulmonary/Chest: Effort normal. No respiratory distress. She has no wheezes.  Abdominal: There is tenderness.  Patient has severe tenderness on light palpation  Neurological: She is oriented to person, place, and time.  Skin: Skin is warm and dry. She is not diaphoretic.  Psychiatric: She has a normal mood and affect.  Nursing note and vitals reviewed.    ED Treatments / Results  Labs (all labs ordered are listed, but only abnormal results are displayed) Labs Reviewed  COMPREHENSIVE METABOLIC PANEL - Abnormal; Notable for the following:       Result Value   Sodium 132 (*)    CO2 17 (*)    Creatinine, Ser 1.27 (*)     Calcium 7.5 (*)    Total Protein 6.4 (*)    Albumin 1.9 (*)    AST 63 (*)    Alkaline Phosphatase 168 (*)    Total Bilirubin 4.0 (*)    GFR calc non Af Amer 42 (*)  GFR calc Af Amer 49 (*)    All other components within normal limits  CBC - Abnormal; Notable for the following:    RBC 3.64 (*)    Hemoglobin 8.3 (*)    HCT 25.2 (*)    MCV 69.2 (*)    MCH 22.8 (*)    RDW 16.3 (*)    All other components within normal limits  URINALYSIS, ROUTINE W REFLEX MICROSCOPIC (NOT AT [facility]) - Abnormal; Notable for the following:    Color, Urine AMBER (*)    APPearance CLOUDY (*)    Hgb urine dipstick SMALL (*)    Bilirubin Urine SMALL (*)    Leukocytes, UA LARGE (*)    All other components within normal limits  URINE MICROSCOPIC-ADD ON - Abnormal; Notable for the following:    Squamous Epithelial / LPF 0-5 (*)    All other components within normal limits  I-STAT CG4 LACTIC ACID, ED - Abnormal; Notable for the following:    Lactic Acid, Venous 2.33 (*)    All other components within normal limits  URINE CULTURE  URINE CULTURE  LIPASE, BLOOD  I-STAT TROPOININ, ED  I-STAT TROPOININ, ED    EKG  EKG Interpretation None       Radiology Ct Abdomen Pelvis W Contrast  Result Date: 06/08/2016 CLINICAL DATA:  Upper abdominal pain, 3 days duration. Nausea and vomiting today. Constipation. EXAM: CT ABDOMEN AND PELVIS WITH CONTRAST TECHNIQUE: Multidetector CT imaging of the abdomen and pelvis was performed using the standard protocol following bolus administration of intravenous contrast. CONTRAST:  71mL ISOVUE-300 IOPAMIDOL (ISOVUE-300) INJECTION 61% COMPARISON:  07/18/2012 FINDINGS: Lower chest: Left effusion layering dependently. Atelectasis of the dependent left lung. Coronary artery stent. Hepatobiliary: Profound fatty change of the liver. Previous cholecystectomy. Portal and hepatic veins are patent. Pancreas: Chronic pancreatic calcifications secondary to old pancreatitis. No evidence of  active pancreatic process. Spleen: Upper limits of normal in size without focal lesion. Adrenals/Urinary Tract: Adrenal glands are normal. Left kidney is normal. Small cysts at the upper pole of the right kidney. Stomach/Bowel: Question edema of the colon wall that could go along with colitis. No evidence of bowel obstruction. Vascular/Lymphatic: Normal Reproductive: Previous hysterectomy.  No pelvic mass. Other: Small amount of ascites. Periumbilical ventral hernia containing only fat. Right upper quadrant anterior abdominal wall hernia containing only fat. Musculoskeletal:  Normal IMPRESSION: Profound fatty change of the liver. 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 Probable edema of the wall of the colon consistent with colitis. Small amount of ascites. Small left pleural effusion layering dependently. Periumbilical hernia containing fat. Right upper quadrant anterior abdominal wall hernia containing only fat. Electronically Signed   By: Nelson Chimes M.D.   On: 06/08/2016 13:03    Procedures Procedures (including critical care time)  Medications Ordered in ED Medications  sodium chloride 0.9 % bolus 1,000 mL (not administered)  fentaNYL (SUBLIMAZE) injection 50 mcg (not administered)  metroNIDAZOLE (FLAGYL) tablet 500 mg (not administered)  cefTRIAXone (ROCEPHIN) 1 g in dextrose 5 % 50 mL IVPB (not administered)  enoxaparin (LOVENOX) injection 40 mg (not administered)  0.9 %  sodium chloride infusion (not administered)  senna-docusate (Senokot-S) tablet 1 tablet (not administered)  ondansetron (ZOFRAN) tablet 4 mg (not administered)    Or  ondansetron (ZOFRAN) injection 4 mg (not administered)  morphine 2 MG/ML injection 2 mg (not administered)  sodium chloride 0.9 % bolus 1,000 mL (0 mLs Intravenous Stopped 06/08/16 1148)  iopamidol (ISOVUE-300) 61 % injection (80 mLs  Contrast Given 06/08/16 1237)     Initial Impression / Assessment and Plan / ED Course  I  have reviewed the triage vital signs and the nursing notes.  Pertinent labs & imaging results that were available during my care of the patient were reviewed by me and considered in my medical decision making (see chart for details).  Clinical Course    Patient is a 70 year old female with chronic pancreatitis presenting with abdominal pain consistent with her pancreatitis. Patient reports for the last 3 days she's had mild nausea and severe abdominal pain. She follows with Dr. Carlean Purl, gastroenterology. Patient says she's been unable to drink or eat much. On arrival she is hypotensive and appears dry. On physical exam her abdomen is acutely tender diffusely. Concern for additional pathology given the level tenderness, not consistent with just pancreatitis.  4:24 PM Workup shows UTI, Trichomonas. Lipase is not elevated however patient has chronic pancreatitis. Will admit for fluid resuscitation, treatment of UTI.  CRITICAL CARE Performed by: Gardiner Sleeper Total critical care time: 30 minutes Critical care time was exclusive of separately billable procedures and treating other patients. Critical care was necessary to treat or prevent imminent or life-threatening deterioration. Critical care was time spent personally by me on the following activities: development of treatment plan with patient and/or surrogate as well as nursing, discussions with consultants, evaluation of patient's response to treatment, examination of patient, obtaining history from patient or surrogate, ordering and performing treatments and interventions, ordering and review of laboratory studies, ordering and review of radiographic studies, pulse oximetry and re-evaluation of patient's condition.   Final Clinical Impressions(s) / ED Diagnoses   Final diagnoses:  None    New Prescriptions New Prescriptions   No medications on file     Octavia Velador Julio Alm, MD 06/08/16 1624

## 2016-06-08 NOTE — H&P (Signed)
History and Physical    [name] Cashin [id number] DOB: 1946/08/26 DOA: 06/08/2016  PCP: Philis Fendt, MD  Outpatient Specialists: GI, Dr. Carlean Purl  Patient coming from: home  Chief Complaint: Abdominal pain  HPI: [name] Foley is a 70 y.o. female with medical history significant of alcohol abuse, in remission, last drink was about 3 years ago, chronic pancreatitis due to #1, history of CBD stricture status post stenting by Dr. Carlean Purl with gastroenterology in the past, depression, hypertension, coronary artery disease status post NSTEMI with stenting in 2007 at 2013, presents to the emergency room with chief complaint of epigastric abdominal pain. She has a history of chronic abdominal pain due to her chronic pancreatitis, however in the last 3 days she feels like her epigastric abdominal pain has gotten worse. She also complains of mild nausea and had 1 episode of emesis. Denies any blood in her emesis. She has no diarrhea or constipation. She denies any chest pain or shortness of breath. She denies any lightheadedness or dizziness. She has no fever or chills. She complains of weakness and poor by mouth intake over the last few days due to pain.  ED Course: In the emergency room, initially she was borderline hypotensive with blood pressures in the 90s, afebrile, Blood work revealed mild hyponatremia with sodium of 132, bicarbonate of 17, creatinine 1.2 from prior normal baseline, AST elevation to 63 and normal ALT of 23, increased bilirubin at 4.0, she has lactic acid of 2.3, hemoglobin of 8.3 and lipase is normal. Urinalysis with large leukocytes, too numerous to count white blood cells count [facility] was asked for admission for acute on chronic pancreatitis/UTI. She underwent a CT scan of the abdomen and pelvis in the emergency room which showed old pancreatitis, possible colitis but no other acute intra-abdominal pathologies   Review of Systems: As per HPI otherwise 10 point review of  systems negative.   Past Medical History:  Diagnosis Date  . Alcohol abuse    Abstinent since 2009  . Chronic pancreatitis ([place])   . Colon adenoma 03/2012   Diminutive cecal  . Common bile duct stricture   . Coronary artery disease    DES x 2 to LAD 2007, DES to LAD 07/2012  . Depression   . Essential hypertension   . Fatty liver   . GERD (gastroesophageal reflux disease)   . GERD (gastroesophageal reflux disease)   . Hemorrhoids   . Hiatal hernia   . History of pneumonia 11/2011  . Hyperlipidemia   . PAD (peripheral artery disease) (HCC)    Occluded right external iliac artery   . Psoriasis   . ST elevation myocardial infarction (STEMI) of anterior wall ([place]) 07/2012  . Type 2 diabetes mellitus ([place])     Past Surgical History:  Procedure Laterality Date  . ABDOMINAL HYSTERECTOMY  1994  . CHOLECYSTECTOMY  1976  . COLONOSCOPY     multiple  . ERCP  07/25/2012   Procedure: ENDOSCOPIC RETROGRADE CHOLANGIOPANCREATOGRAPHY (ERCP);  Surgeon: Gatha Mayer, MD;  Location: [facility];  Service: Endoscopy;  Laterality: N/A;  with stent  . ESOPHAGOGASTRODUODENOSCOPY  10/25/2012   Procedure: ESOPHAGOGASTRODUODENOSCOPY (EGD);  Surgeon: Gatha Mayer, MD;  Location: [facility];  Service: Endoscopy;  Laterality: N/A;  EGD with stent removal using ERCP scope  . EUS  01/05/2012   Procedure: UPPER ENDOSCOPIC ULTRASOUND (EUS) LINEAR;  Surgeon: Milus Banister, MD;  Location: [facility] ENDOSCOPY;  Service: Endoscopy;  Laterality: N/A;  radial linear  . HEMORRHOID  SURGERY  09/17/2013   [place] hem ligation/pexy  . LEFT HEART CATH N/A 08/10/2012   Procedure: LEFT HEART CATH;  Surgeon: Peter M Martinique, MD;  Location: [facility] CATH LAB;  Service: Cardiovascular;  Laterality: N/A;  . PERCUTANEOUS CORONARY STENT INTERVENTION (PCI-S)  08/10/2012   Procedure: PERCUTANEOUS CORONARY STENT INTERVENTION (PCI-S);  Surgeon: Peter M Martinique, MD;  Location: [facility] CATH LAB;  Service: Cardiovascular;;  . STRESS MYOCARDIAL  PERFUSION  04/20/10   NORMAL PATTERN OF PERFUSION IN ALL REGIONS. LV SIZE IS NORMAL. EF= 61%. NO SIGN ISCHEMIA DEMONSTRATED.  Marland Kitchen TRANSTHORACIC ECHOCARDIOGRAM  04/20/10   PROXIMAL SEPTAL THICKENING IS NOTED. LV SYSTOLIC FUNCTION IS NORMAL. EF= >55%. LEFT ATRIAL SIZE IS NORMAL. RIGHT VENTRICULAR SYSTOLIC PRESSURE IS NORMAL. AV APPEARS TO BE MILDLY SCLEROTIC. NO AS.     reports that she has quit smoking. Her smoking use included Cigarettes. She has a 7.50 pack-year smoking history. She has never used smokeless tobacco. She reports that she does not drink alcohol or use drugs.  Allergies  Allergen Reactions  . Diclofenac Sodium Other (See Comments)    REACTION: Chronic renal insufficiency. Patient can use the gel   . Other Other (See Comments)    Blackeyed peas and P[place] Beans...break out in hives    Family History  Problem Relation Age of Onset  . Colon cancer Mother     Colon resection  . Diabetes Mother   . Cirrhosis Father   . Coronary artery disease Brother   . Renal Disease Brother   . Malignant hyperthermia Neg Hx     Prior to Admission medications   Medication Sig Start Date End Date Taking? Authorizing Provider  acetaminophen (TYLENOL 8 HOUR) 650 MG CR tablet Take 1 tablet (650 mg total) by mouth every 8 (eight) hours as needed for pain. 02/05/16  Yes Varney Biles, MD  aspirin EC 81 MG EC tablet Take 1 tablet (81 mg total) by mouth daily. 08/13/12  Yes Isaiah Serge, NP  atorvastatin (LIPITOR) 40 MG tablet TAKE ONE (1) TABLET EACH DAY AT 6PM Patient taking differently: Take 40 mg by mouth each day 09/16/13  Yes Lorretta Harp, MD  clonazePAM (KLONOPIN) 1 MG tablet Take 1 tablet (1 mg total) by mouth at bedtime. 09/02/15  Yes Gatha Mayer, MD  clopidogrel (PLAVIX) 75 MG tablet Take 75 mg by mouth daily.     Yes Historical Provider, MD  diclofenac sodium (VOLTAREN) 1 % GEL Apply 4 g topically 4 (four) times daily. Patient taking differently: Apply 4 g topically 2 (two) times  daily as needed (for pain).  09/02/15  Yes Gatha Mayer, MD  losartan (COZAAR) 25 MG tablet Take 25 mg by mouth daily.   Yes Historical Provider, MD  mirtazapine (REMERON) 15 MG tablet Take 1 tablet (15 mg total) by mouth at bedtime. 07/13/12  Yes Gatha Mayer, MD  omeprazole (PRILOSEC) 40 MG capsule TAKE ONE CAPSULE BY MOUTH DAILY Patient taking differently: TAKE 40 MG BY MOUTH DAILY 01/21/16  Yes Gatha Mayer, MD  Pancrelipase, Lip-Prot-Amyl, (CREON) 24000 UNITS CPEP TAKE 3 CAPSULES 3 TIMES A DAY WITH MEALS AND TAKE ONE CAPSULE WITH SNACKS Patient taking differently: Take 24,000-72,000 Units by mouth See admin instructions. Take 72000 units by mouth 3 times daily with meals and take 24000 units by mouth with snack. 09/02/15  Yes Gatha Mayer, MD  polyethylene glycol [facility] / GLYCOLAX) packet Take 17 g by mouth daily as needed for moderate constipation.  Yes Historical Provider, MD  QUEtiapine (SEROQUEL) 50 MG tablet Take 50 mg by mouth at bedtime.   Yes Historical Provider, MD  Vitamin D, Ergocalciferol, (DRISDOL) 50000 UNITS CAPS capsule Take 50,000 Units by mouth every 7 (seven) days. No specific day 09/02/14  Yes Historical Provider, MD  cephALEXin (KEFLEX) 500 MG capsule Take 1 capsule (500 mg total) by mouth 2 (two) times daily. Patient not taking: Reported on 06/08/2016 05/16/16   Dorie Rank, MD  HYDROcodone-acetaminophen (NORCO/VICODIN) 5-325 MG tablet Take 1 tablet by mouth every 6 (six) hours as needed. Patient not taking: Reported on 06/08/2016 02/05/16   Varney Biles, MD  methocarbamol (ROBAXIN) 500 MG tablet Take 1 tablet (500 mg total) by mouth 2 (two) times daily. Patient not taking: Reported on 06/08/2016 02/05/16   Varney Biles, MD  nicotine (NICODERM CQ) 14 mg/24hr patch Place 1 patch onto the skin daily. Patient not taking: Reported on 02/05/2016 04/03/13   Lorretta Harp, MD  nitroGLYCERIN (NITROSTAT) 0.4 MG SL tablet Place 1 tablet (0.4 mg total) under the tongue every 5  (five) minutes x 3 doses as needed for chest pain. 09/02/15   Lorretta Harp, MD  oxyCODONE (OXY IR/ROXICODONE) 5 MG immediate release tablet Take 1-2 tablets (5-10 mg total) by mouth every 6 (six) hours as needed for moderate pain, severe pain or breakthrough pain. Patient not taking: Reported on 06/08/2016 09/02/15   Gatha Mayer, MD    Physical Exam: Vitals:   06/08/16 1351 06/08/16 1400 06/08/16 1430 06/08/16 1541  BP: (!) 114/103 95/69 103/78 112/78  Pulse: 79 76 74 75  Resp: 18   17  Temp:      TempSrc:      SpO2: 100% 100% 98% 98%  Weight:      Height:          Constitutional: NAD, calm, comfortable Vitals:   06/08/16 1351 06/08/16 1400 06/08/16 1430 06/08/16 1541  BP: (!) 114/103 95/69 103/78 112/78  Pulse: 79 76 74 75  Resp: 18   17  Temp:      TempSrc:      SpO2: 100% 100% 98% 98%  Weight:      Height:       Eyes: PERRL, lids and conjunctivae normal ENMT: Mucous membranes are dry Neck: normal, supple, no masses, no thyromegaly Respiratory: clear to auscultation bilaterally, no wheezing, no crackles.  Cardiovascular: Regular rate and rhythm, no murmurs / rubs / gallops. No extremity edema.  Abdomen:  tenderness in the midepigastric and right upper quadrant area, bowel sounds positive  Musculoskeletal: no clubbing / cyanosis.  Skin: no rashes, lesions, ulcers. No induration Neurologic: CN 2-12 grossly intact. Nonfocal, ambulatory in the room Psychiatric: Normal judgment and insight. Alert and oriented x 3. Normal mood.   Labs on Admission: I have personally reviewed following labs and imaging studies  CBC:  Recent Labs Lab 06/08/16 0958  WBC 7.1  HGB 8.3*  HCT 25.2*  MCV 69.2*  PLT [id number]   Basic Metabolic Panel:  Recent Labs Lab 06/08/16 0958  NA 132*  K 4.0  CL 105  CO2 17*  GLUCOSE 91  BUN 6  CREATININE 1.27*  CALCIUM 7.5*   GFR: Estimated Creatinine Clearance: 36.6 mL/min (by C-G formula based on SCr of 1.27 mg/dL). Liver Function  Tests:  Recent Labs Lab 06/08/16 0958  AST 63*  ALT 23  ALKPHOS 168*  BILITOT 4.0*  PROT 6.4*  ALBUMIN 1.9*    Recent Labs Lab 06/08/16 [phone]  LIPASE 11   Urine analysis:    Component Value Date/Time   COLORURINE AMBER (A) 06/08/2016 1144   APPEARANCEUR CLOUDY (A) 06/08/2016 1144   LABSPEC 1.011 06/08/2016 1144   PHURINE 6.0 06/08/2016 1144   GLUCOSEU NEGATIVE 06/08/2016 1144   HGBUR SMALL (A) 06/08/2016 1144   BILIRUBINUR SMALL (A) 06/08/2016 1144   KETONESUR NEGATIVE 06/08/2016 1144   PROTEINUR NEGATIVE 06/08/2016 1144   UROBILINOGEN 1.0 07/03/2012 2021   NITRITE NEGATIVE 06/08/2016 1144   LEUKOCYTESUR LARGE (A) 06/08/2016 1144   Sepsis Labs: @LABRCNTIP (procalcitonin:4,lacticidven:4) )No results found for this or any previous visit (from the past 240 hour(s)).   Radiological Exams on Admission: Ct Abdomen Pelvis W Contrast  Result Date: 06/08/2016 CLINICAL DATA:  Upper abdominal pain, 3 days duration. Nausea and vomiting today. Constipation. EXAM: CT ABDOMEN AND PELVIS WITH CONTRAST TECHNIQUE: Multidetector CT imaging of the abdomen and pelvis was performed using the standard protocol following bolus administration of intravenous contrast. CONTRAST:  59mL ISOVUE-300 IOPAMIDOL (ISOVUE-300) INJECTION 61% COMPARISON:  07/18/2012 FINDINGS: Lower chest: Left effusion layering dependently. Atelectasis of the dependent left lung. Coronary artery stent. Hepatobiliary: Profound fatty change of the liver. Previous cholecystectomy. Portal and hepatic veins are patent. Pancreas: Chronic pancreatic calcifications secondary to old pancreatitis. No evidence of active pancreatic process. Spleen: Upper limits of normal in size without focal lesion. Adrenals/Urinary Tract: Adrenal glands are normal. Left kidney is normal. Small cysts at the upper pole of the right kidney. Stomach/Bowel: Question edema of the colon wall that could go along with colitis. No evidence of bowel obstruction.  Vascular/Lymphatic: Normal Reproductive: Previous hysterectomy.  No pelvic mass. Other: Small amount of ascites. Periumbilical ventral hernia containing only fat. Right upper quadrant anterior abdominal wall hernia containing only fat. Musculoskeletal:  Normal IMPRESSION: Profound fatty change of the liver. 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 Probable edema of the wall of the colon consistent with colitis. Small amount of ascites. Small left pleural effusion layering dependently. Periumbilical hernia containing fat. Right upper quadrant anterior abdominal wall hernia containing only fat. Electronically Signed   By: Nelson Chimes M.D.   On: 06/08/2016 13:03    EKG: Independently reviewed. Sinus rhythm, overall low voltage  Assessment/Plan Active Problems:   HYPERTENSION, BENIGN SYSTEMIC   PANCREATITIS, CHRONIC    ALCOHOL ABUSE, HX OF   Periumbilical hernia   Hyperlipidemia   Abdominal pain, chronic, epigastric - from chronic pancreatitis   Peripheral arterial disease (HCC)   CAD (coronary artery disease), native coronary artery   Type 2 diabetes mellitus (HCC)   Pancreatitis   Pain in the setting of acute on chronic pancreatitis  - Admit patient to MedSurg, nothing by mouth, IV fluids overnight, pain control and nausea control  - She is quite tender to palpation in the epigastric area  - Lipase is normal however it can be chronic pancreatitis  - Given extensive cardiac history, we'll rule out anginal equivalent and check a troponin   Elevated liver enzymes, elevated bilirubin - Given history of CBD stricture, we'll check a right upper quadrant ultrasound. CT abdomen and pelvis is normal, however ultrasound characterizes gallbladder and biliary tree better - We'll repeat blood work in the morning  Coronary artery disease - As above, check troponin, if positive will place on telemetry and cycle overnight - Meanwhile continue aspirin, Lipitor, Plavix, hold  Cozaar given hypotension  UTI - We'll start ceftriaxone, obtain urine cultures - Also appears to have Trichomonas, status post metronidazole per EDP  Type 2 diabetes  mellitus - Diet controlled, most recent hemoglobin A1c was 6.0 in 2013 - CBG well-controlled on the BMP  Hyponatremia - Likely in the setting of dehydration, recheck blood work in the morning  Mild AKI / elevated lactic acid / bicarbonate of 17 - Likely in the setting of dehydration and poor by mouth intake, provide IV fluids and recheck in the morning  Anemia - Chronic, no evidence of bleed, continue to monitor - Check anemia panel  Hypertension - Hold home antihypertensives given soft blood pressure on admission, resume in the morning as appropriate   DVT prophylaxis: Lovenox   Code Status: Full  Family Communication: no family bedside Disposition Plan: medsurg Consults called: none  Admission status: observation    Marzetta Board, MD [facility] Pager [phone]  If 7PM-7AM, please contact night-coverage [email or web address] Password [facility]  06/08/2016, 4:24 PM

## 2016-06-08 NOTE — ED Notes (Signed)
Patient placed back on monitor, continuous pulse oximetry and blood pressure cuff

## 2016-06-08 NOTE — ED Notes (Signed)
Patient out of the department at this time for testing

## 2016-06-09 DIAGNOSIS — N390 Urinary tract infection, site not specified: Secondary | ICD-10-CM | POA: Diagnosis present

## 2016-06-09 DIAGNOSIS — Z87891 Personal history of nicotine dependence: Secondary | ICD-10-CM | POA: Diagnosis not present

## 2016-06-09 DIAGNOSIS — K859 Acute pancreatitis without necrosis or infection, unspecified: Principal | ICD-10-CM

## 2016-06-09 DIAGNOSIS — E785 Hyperlipidemia, unspecified: Secondary | ICD-10-CM | POA: Diagnosis present

## 2016-06-09 DIAGNOSIS — D509 Iron deficiency anemia, unspecified: Secondary | ICD-10-CM | POA: Diagnosis present

## 2016-06-09 DIAGNOSIS — A599 Trichomoniasis, unspecified: Secondary | ICD-10-CM | POA: Diagnosis present

## 2016-06-09 DIAGNOSIS — R1084 Generalized abdominal pain: Secondary | ICD-10-CM | POA: Diagnosis present

## 2016-06-09 DIAGNOSIS — Z7982 Long term (current) use of aspirin: Secondary | ICD-10-CM | POA: Diagnosis not present

## 2016-06-09 DIAGNOSIS — E871 Hypo-osmolality and hyponatremia: Secondary | ICD-10-CM | POA: Diagnosis present

## 2016-06-09 DIAGNOSIS — F1010 Alcohol abuse, uncomplicated: Secondary | ICD-10-CM | POA: Diagnosis present

## 2016-06-09 DIAGNOSIS — I252 Old myocardial infarction: Secondary | ICD-10-CM | POA: Diagnosis not present

## 2016-06-09 DIAGNOSIS — I959 Hypotension, unspecified: Secondary | ICD-10-CM | POA: Diagnosis present

## 2016-06-09 DIAGNOSIS — E860 Dehydration: Secondary | ICD-10-CM | POA: Diagnosis present

## 2016-06-09 DIAGNOSIS — F1021 Alcohol dependence, in remission: Secondary | ICD-10-CM

## 2016-06-09 DIAGNOSIS — I2510 Atherosclerotic heart disease of native coronary artery without angina pectoris: Secondary | ICD-10-CM | POA: Diagnosis present

## 2016-06-09 DIAGNOSIS — Z7902 Long term (current) use of antithrombotics/antiplatelets: Secondary | ICD-10-CM | POA: Diagnosis not present

## 2016-06-09 DIAGNOSIS — N189 Chronic kidney disease, unspecified: Secondary | ICD-10-CM | POA: Diagnosis present

## 2016-06-09 DIAGNOSIS — E1151 Type 2 diabetes mellitus with diabetic peripheral angiopathy without gangrene: Secondary | ICD-10-CM | POA: Diagnosis present

## 2016-06-09 DIAGNOSIS — K219 Gastro-esophageal reflux disease without esophagitis: Secondary | ICD-10-CM | POA: Diagnosis present

## 2016-06-09 DIAGNOSIS — I10 Essential (primary) hypertension: Secondary | ICD-10-CM | POA: Diagnosis not present

## 2016-06-09 DIAGNOSIS — E1122 Type 2 diabetes mellitus with diabetic chronic kidney disease: Secondary | ICD-10-CM | POA: Diagnosis present

## 2016-06-09 DIAGNOSIS — K429 Umbilical hernia without obstruction or gangrene: Secondary | ICD-10-CM | POA: Diagnosis present

## 2016-06-09 DIAGNOSIS — Z23 Encounter for immunization: Secondary | ICD-10-CM | POA: Diagnosis not present

## 2016-06-09 DIAGNOSIS — R1013 Epigastric pain: Secondary | ICD-10-CM | POA: Diagnosis not present

## 2016-06-09 DIAGNOSIS — I129 Hypertensive chronic kidney disease with stage 1 through stage 4 chronic kidney disease, or unspecified chronic kidney disease: Secondary | ICD-10-CM | POA: Diagnosis present

## 2016-06-09 DIAGNOSIS — F329 Major depressive disorder, single episode, unspecified: Secondary | ICD-10-CM | POA: Diagnosis present

## 2016-06-09 DIAGNOSIS — K860 Alcohol-induced chronic pancreatitis: Secondary | ICD-10-CM | POA: Diagnosis present

## 2016-06-09 DIAGNOSIS — N179 Acute kidney failure, unspecified: Secondary | ICD-10-CM | POA: Diagnosis present

## 2016-06-09 LAB — URINE CULTURE

## 2016-06-09 LAB — HEMOGLOBIN AND HEMATOCRIT, BLOOD
HEMATOCRIT: 25 % — AB (ref 36.0–46.0)
Hemoglobin: 8.3 g/dL — ABNORMAL LOW (ref 12.0–15.0)

## 2016-06-09 LAB — CBC
HCT: 23.9 % — ABNORMAL LOW (ref 36.0–46.0)
HEMATOCRIT: 19.6 % — AB (ref 36.0–46.0)
Hemoglobin: 6.9 g/dL — CL (ref 12.0–15.0)
Hemoglobin: 8.2 g/dL — ABNORMAL LOW (ref 12.0–15.0)
MCH: 23.2 pg — ABNORMAL LOW (ref 26.0–34.0)
MCH: 23.4 pg — ABNORMAL LOW (ref 26.0–34.0)
MCHC: 34.2 g/dL (ref 30.0–36.0)
MCHC: 34.3 g/dL (ref 30.0–36.0)
MCV: 67.8 fL — AB (ref 78.0–100.0)
MCV: 68.3 fL — ABNORMAL LOW (ref 78.0–100.0)
PLATELETS: 163 10*3/uL (ref 150–400)
PLATELETS: 231 10*3/uL (ref 150–400)
RBC: 2.89 MIL/uL — AB (ref 3.87–5.11)
RBC: 3.5 MIL/uL — AB (ref 3.87–5.11)
RDW: 16.7 % — AB (ref 11.5–15.5)
RDW: 17 % — AB (ref 11.5–15.5)
WBC: 5 10*3/uL (ref 4.0–10.5)
WBC: 8.9 10*3/uL (ref 4.0–10.5)

## 2016-06-09 LAB — COMPREHENSIVE METABOLIC PANEL
ALBUMIN: 1.5 g/dL — AB (ref 3.5–5.0)
ALK PHOS: 131 U/L — AB (ref 38–126)
ALT: 17 U/L (ref 14–54)
ANION GAP: 9 (ref 5–15)
AST: 49 U/L — ABNORMAL HIGH (ref 15–41)
BUN: <5 mg/dL — ABNORMAL LOW (ref 6–20)
CALCIUM: 7 mg/dL — AB (ref 8.9–10.3)
CO2: 18 mmol/L — AB (ref 22–32)
CREATININE: 1.01 mg/dL — AB (ref 0.44–1.00)
Chloride: 109 mmol/L (ref 101–111)
GFR calc Af Amer: >60 mL/min (ref >60)
GFR calc non Af Amer: 56 mL/min — ABNORMAL LOW (ref >60)
GLUCOSE: 74 mg/dL (ref 65–99)
Potassium: 3.7 mmol/L (ref 3.5–5.1)
SODIUM: 136 mmol/L (ref 135–145)
Total Bilirubin: 2.7 mg/dL — ABNORMAL HIGH (ref 0.3–1.2)
Total Protein: 5.2 g/dL — ABNORMAL LOW (ref 6.5–8.1)

## 2016-06-09 MED ORDER — SENNA 8.6 MG PO TABS
2.0000 | ORAL_TABLET | Freq: Every day | ORAL | Status: DC
  Administered 2016-06-09: 17.2 mg via ORAL
  Filled 2016-06-09: qty 2

## 2016-06-09 MED ORDER — POLYETHYLENE GLYCOL 3350 17 G PO PACK
17.0000 g | PACK | Freq: Every day | ORAL | Status: DC
  Administered 2016-06-09 – 2016-06-10 (×2): 17 g via ORAL
  Filled 2016-06-09 (×2): qty 1

## 2016-06-09 MED ORDER — PANCRELIPASE (LIP-PROT-AMYL) 12000-38000 UNITS PO CPEP: 24000.0000 [IU] | ORAL_CAPSULE | ORAL | Status: DC

## 2016-06-09 MED ORDER — PANCRELIPASE (LIP-PROT-AMYL) 12000-38000 UNITS PO CPEP: 24000.0000 [IU] | ORAL_CAPSULE | ORAL | Status: DC | PRN

## 2016-06-09 MED ORDER — PANCRELIPASE (LIP-PROT-AMYL) 12000-38000 UNITS PO CPEP
72000.0000 [IU] | ORAL_CAPSULE | Freq: Three times a day (TID) | ORAL | Status: DC
  Administered 2016-06-09 – 2016-06-10 (×2): 72000 [IU] via ORAL
  Filled 2016-06-09 (×2): qty 6

## 2016-06-09 MED ORDER — POLYETHYLENE GLYCOL 3350 17 G PO PACK: 17.0000 g | PACK | Freq: Every day | ORAL | Status: DC

## 2016-06-09 NOTE — Progress Notes (Addendum)
CRITICAL VALUE ALERT  Critical value received:  Hgb=6.9  Date of notification:  06/09/2016  Time of notification:  0400  Critical value read back:Yes.    Nurse who received alert:  C. Ahley Bulls  MD notified (1st page):  Tylene Fantasia  Time of first page:  0423  MD notified (2nd page): n.a.  Time of second page: n.a.  Responding MD: Tylene Fantasia   Time MD responded: [phone]  Received order for H & H at 1000.

## 2016-06-09 NOTE — Progress Notes (Signed)
PROGRESS NOTE        PATIENT DETAILS Name: [name] Foley Age: 70 y.o. Sex: female Date of Birth: 1946/08/05 Admit Date: 06/08/2016 Admitting Physician Costin Karlyne Greenspan, MD [id number] Bunnie Domino, MD  Brief Narrative: Patient is a 70 y.o. female history of chronic alcoholic pancreatitis, CAD presented with epigastric pain and vomiting. CT abdomen negative for acute abnormalities, thought to have flare of underlying chronic pancreatitis and possibly UTI. Admitted for supportive care and IV antibiotics. Post admission, she has improved, diet is being slowly advanced.  Subjective: Abdominal pain has significantly improved, no further vomiting. She claims her pain is almost back to her usual baseline.  Assessment/Plan: Active Problems: Acute on chronic abdominal pain: Not sure if this is a flare of underlying pancreatitis, or provoked by UTI. In any event, she is clearly improving with just supportive measures. Abdomen exam is benign. CT scan of the abdomen on 8/16 without acute abnormalities, right upper quadrant ultrasound does not show any acute abnormalities as well. Plan is to advance to full liquids today, and advance to regular diet tomorrow-if she continues to do well, suspect she should be able to be discharged on 8/18.  UTI: Continue intravenous Rocephin, urine cultures shows multiple bacterial morphotypes-I do not think that repeat urine cultures are warranted-as cultures will likely be sterile as she is already on antibiotics since admission. WIll plan on 3 days of empiric antibiotics. She is afebrile, without leukocytosis and clinically improving with no further vomiting.  Minimally elevated liver enzymes: CT abdomen/abdominal ultrasound negative for any significant abnormalities, suspect that this is probably chronic in nature. Continue outpatient follow-up with her primary gastroenterologist.  History of chronic pancreatitis: Abdominal pain has improved-CT  abdomen without any significant abnormalities. Resume Creon and follow. CT abdomen without any signs of exacerbation. Note-prior history of alcohol use, claims she's been abstinent for the past 2-3 years.  CAD: Stable, without any chest pain. Troponin negative. 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 She does not have any overt bleeding at this time-anemia panel showed a ferritin of 627 and an iron of 67. Stable for outpatient follow-up with PCP.  Hypertension: Blood pressure continues to be soft and fluctuating, continue to hold losartan for now.  Acute kidney injury: Suspect mild prerenal azotemia in a setting of vomiting. Resolved with IV fluids.  Dyslipidemia: Continue statin, continue to follow LFTs in the outpatient setting  Tobacco abuse: Counseled, continue transdermal nicotine  GERD: Continue PPI  History of alcohol abuse: Claims to be abstinent the past 2-3 years.  DVT Prophylaxis: Prophylactic Lovenox   Code Status: Full code   Family Communication: None at bedside if and she is awake and alert and understanding of the above noted plan.  Disposition Plan: Remain suspect- home on 8/18  Antimicrobial agents: None  Procedures: None  CONSULTS:  None  Time spent: 25 minutes-Greater than 50% of this time was spent in counseling, explanation of diagnosis, planning of further management, and coordination of care.  MEDICATIONS: Anti-infectives    Start     Dose/Rate Route Frequency Ordered Stop   06/08/16 1630  cefTRIAXone (ROCEPHIN) 1 g in dextrose 5 % 50 mL IVPB     1 g 100 mL/hr over 30 Minutes Intravenous Every  24 hours 06/08/16 1603     06/08/16 1600  metroNIDAZOLE (FLAGYL) tablet 500 mg     500 mg Oral  Once 06/08/16 1551 06/08/16 1655      Scheduled Meds: . aspirin EC  81 mg Oral Daily  . atorvastatin  40  mg Oral q1800  . cefTRIAXone (ROCEPHIN)  IV  1 g Intravenous Q24H  . clonazePAM  1 mg Oral QHS  . clopidogrel  75 mg Oral Daily  . enoxaparin (LOVENOX) injection  40 mg Subcutaneous Q24H  . mirtazapine  15 mg Oral QHS  . nicotine  14 mg Transdermal Q24H  . pantoprazole  40 mg Oral Daily  . polyethylene glycol  17 g Oral Daily  . QUEtiapine  50 mg Oral QHS  . senna  2 tablet Oral QHS  . sodium chloride  1,000 mL Intravenous Once   Continuous Infusions:  PRN Meds:.diclofenac sodium, morphine injection, ondansetron **OR** ondansetron (ZOFRAN) IV, senna-docusate   PHYSICAL EXAM: Vital signs: Vitals:   06/08/16 1823 06/08/16 2022 06/09/16 0449 06/09/16 1324  BP: 93/67 104/69 (!) 83/53 (!) 86/60  Pulse: 94 83 100 72  Resp: 16 16 18 18   Temp: 97.7 F (36.5 C) 98.2 F (36.8 C) 98.4 F (36.9 C) 98.4 F (36.9 C)  TempSrc: Oral Oral Oral Oral  SpO2: 100% 99% 100% 100%  Weight: 60.6 kg (133 lb 9.6 oz)     Height: 5\' 2"  (1.575 m)      Filed Weights   06/08/16 0925 06/08/16 1823  Weight: 61.7 kg (136 lb) 60.6 kg (133 lb 9.6 oz)   Body mass index is 24.44 kg/m.   Gen Exam: Awake and alert with clear speech. Not in any distress -Sitting in the chair at bedside. Neck: Supple, No JVD.   Chest: B/L Clear.   CVS: S1 S2 Regular, no murmurs.  Abdomen: soft, BS +, large scar present-mildly tender in the epigastric area but without any peritoneal signs. Abdomen is soft and nondistended.  Extremities: no edema, lower extremities warm to touch Neurologic: Non Focal.   Skin: No Rash or lesions   Wounds: N/A.    I have personally reviewed following labs and imaging studies  LABORATORY DATA: CBC:  Recent Labs Lab 06/08/16 0958 06/09/16 0222 06/09/16 0952  WBC 7.1 5.0 8.9  HGB 8.3* 6.9* 8.2*  8.3*  HCT 25.2* 19.6* 23.9*  25.0*  MCV 69.2* 67.8* 68.3*  PLT 210 163 [id number]    Basic Metabolic Panel:  Recent Labs Lab 06/08/16 0958 06/09/16 0222  NA 132* 136  K 4.0 3.7  CL 105  109  CO2 17* 18*  GLUCOSE 91 74  BUN 6 <5*  CREATININE 1.27* 1.01*  CALCIUM 7.5* 7.0*    GFR: Estimated Creatinine Clearance: 45.7 mL/min (by C-G formula based on SCr of 1.01 mg/dL).  Liver Function Tests:  Recent Labs Lab 06/08/16 0958 06/09/16 0222  AST 63* 49*  ALT 23 17  ALKPHOS 168* 131*  BILITOT 4.0* 2.7*  PROT 6.4* 5.2*  ALBUMIN 1.9* 1.5*    Recent Labs Lab 06/08/16 0958  LIPASE 11   No results for input(s): AMMONIA in the last 168 hours.  Coagulation Profile: No results for input(s): INR, PROTIME in the last 168 hours.  Cardiac Enzymes:  Recent Labs Lab 06/08/16 1642  TROPONINI <0.03    BNP (last 3 results) No results for input(s): PROBNP in the last 8760 hours.  HbA1C: No results for input(s): HGBA1C in the last 72 hours.  CBG:  Recent Labs Lab 06/08/16 1709  GLUCAP 84    Lipid Profile: No results for input(s): CHOL, HDL, LDLCALC, TRIG, CHOLHDL, LDLDIRECT in the last 72 hours.  Thyroid Function Tests: No results for input(s): TSH, T4TOTAL, FREET4, T3FREE, THYROIDAB in the last 72 hours.  Anemia Panel:  Recent Labs  06/08/16 1642  VITAMINB12 1,955*  FOLATE 14.9  FERRITIN 627*  TIBC NOT CALCULATED  IRON 61  RETICCTPCT 1.0    Urine analysis:    Component Value Date/Time   COLORURINE AMBER (A) 06/08/2016 1144   APPEARANCEUR CLOUDY (A) 06/08/2016 1144   LABSPEC 1.011 06/08/2016 1144   PHURINE 6.0 06/08/2016 1144   GLUCOSEU NEGATIVE 06/08/2016 1144   HGBUR SMALL (A) 06/08/2016 1144   BILIRUBINUR SMALL (A) 06/08/2016 1144   KETONESUR NEGATIVE 06/08/2016 1144   PROTEINUR NEGATIVE 06/08/2016 1144   UROBILINOGEN 1.0 07/03/2012 2021   NITRITE NEGATIVE 06/08/2016 1144   LEUKOCYTESUR LARGE (A) 06/08/2016 1144    Sepsis Labs: Lactic Acid, Venous    Component Value Date/Time   LATICACIDVEN 2.33 (HH) 06/08/2016 1020    MICROBIOLOGY: Recent Results (from the past 240 hour(s))  Urine culture     Status: Abnormal    Collection Time: 06/08/16 11:44 AM  Result Value Ref Range Status   Specimen Description URINE, RANDOM  Final   Special Requests NONE  Final   Culture MULTIPLE SPECIES PRESENT, SUGGEST RECOLLECTION (A)  Final   Report Status 06/09/2016 FINAL  Final    RADIOLOGY STUDIES/RESULTS: Dg Chest 2 View  Result Date: 05/16/2016 CLINICAL DATA:  Fatigue and weight loss EXAM: CHEST  2 VIEW COMPARISON:  02/05/2016 FINDINGS: The heart size and mediastinal contours are within normal limits. Both lungs are clear. The visualized skeletal structures are unremarkable. Coronary stent is again seen. IMPRESSION: No active cardiopulmonary disease. Electronically Signed   By: Inez Catalina M.D.   On: 05/16/2016 22:29  Ct Abdomen Pelvis W Contrast  Result Date: 06/08/2016 CLINICAL DATA:  Upper abdominal pain, 3 days duration. Nausea and vomiting today. Constipation. EXAM: CT ABDOMEN AND PELVIS WITH CONTRAST TECHNIQUE: Multidetector CT imaging of the abdomen and pelvis was performed using the standard protocol following bolus administration of intravenous contrast. CONTRAST:  12mL ISOVUE-300 IOPAMIDOL (ISOVUE-300) INJECTION 61% COMPARISON:  07/18/2012 FINDINGS: Lower chest: Left effusion layering dependently. Atelectasis of the dependent left lung. Coronary artery stent. Hepatobiliary: Profound fatty change of the liver. Previous cholecystectomy. Portal and hepatic veins are patent. Pancreas: Chronic pancreatic calcifications secondary to old pancreatitis. No evidence of active pancreatic process. Spleen: Upper limits of normal in size without focal lesion. Adrenals/Urinary Tract: Adrenal glands are normal. Left kidney is normal. Small cysts at the upper pole of the right kidney. Stomach/Bowel: Question edema of the colon wall that could go along with colitis. No evidence of bowel obstruction. Vascular/Lymphatic: Normal Reproductive: Previous hysterectomy.  No pelvic mass. Other: Small amount of ascites. Periumbilical ventral  hernia containing only fat. Right upper quadrant anterior abdominal wall hernia containing only fat. Musculoskeletal:  Normal IMPRESSION: Profound fatty change of the liver. 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 Probable edema of the wall of the colon consistent with colitis. Small amount of ascites. Small left pleural effusion layering dependently. Periumbilical hernia containing fat. Right upper quadrant anterior abdominal wall hernia containing only fat. Electronically Signed   By: Nelson Chimes M.D.   On: 06/08/2016 13:03   US Abdomen Limited Ruq  Result Date: 06/08/2016 CLINICAL DATA:  Three day history of right upper quadrant pain with nausea  vomiting today. EXAM: US ABDOMEN LIMITED - RIGHT UPPER QUADRANT COMPARISON:  CT scan 06/08/2016 FINDINGS: Gallbladder: Surgically absent. Common bile duct: Diameter: 10 mm. Liver: Coarsening of the echotexture suggests steatosis. IMPRESSION: 1. Status postcholecystectomy. 2. Mild distention of the extrahepatic common duct for patient age. This may be related to prior cholecystectomy. Correlation with liver function test may prove helpful. Electronically Signed   By: Misty Stanley M.D.   On: 06/08/2016 18:53     LOS: 0 days   Oren Binet, MD  [facility] Pager:[phone]  If 7PM-7AM, please contact night-coverage [email or web address] Password [email or web address] 06/09/2016, 3:10 PM

## 2016-06-09 NOTE — Care Management Obs Status (Signed)
[place] NOTIFICATION   Patient Details  Name: [name] Foley MRN: [id number] Date of Birth: 11-Sep-1946   Medicare Observation Status Notification Given:  Yes    CrutchfieldAntony Haste, RN 06/09/2016, 4:07 PM

## 2016-06-10 NOTE — Progress Notes (Signed)
Informed by RN, that the patient wanted to leave right away and did not want to wait for any discharge instructions. Apparently she tolerated a regular diet this morning. She subsequently signed out [place] as she did not want to wait for discharge instructions.

## 2016-06-10 NOTE — Discharge Summary (Signed)
PATIENT DETAILS Name: [name] Foley Age: 70 y.o. Sex: female Date of Birth: [birth year] MRN: [id number]. Admitting Physician: Caren Griffins, MD :[id number] Bunnie Domino, MD  Admit Date: 06/08/2016 Discharge date: 06/10/2016   Note-patient left AGAINST MEDICAL ADVICE.  Recommendations for Outpatient Follow-up:  1. Follow up with PCP in 1-2 weeks 2. Please obtain BMP/CBC in one week 3. She was instructed by the RN to not take antihypertensives as she continued to have a soft blood pressure. 4. Consider referral to gastroenterology-has chronic microcytic anemia-and is due for colonoscopy in 2018.  Admitted From:  Home  Disposition: [place]: Yes  Equipment/Devices: None  Discharge Condition: Stable  CODE STATUS: FULL CODE/  Diet recommendation:  Heart Healthy   Brief Summary: See H&P, Labs, Consult and Test reports for all details in brief,Patient is a 70 y.o. female history of chronic alcoholic pancreatitis, CAD presented with epigastric pain and vomiting. CT abdomen negative for acute abnormalities, thought to have flare of underlying chronic pancreatitis and possibly UTI. She was admitted, initially kept nothing by mouth, started on empiric Rocephin did with these measures she improved, by day of discharge she was tolerating a regular diet. Unfortunately she did not want to wait for formal discharge instructions as her ride was here and left AGAINST MEDICAL ADVICE before this M.D. could go over discharge medications and other discharge instructions.  Brief Hospital Course: Acute on chronic abdominal pain: Not sure if this is a flare of underlying pancreatitis, or provoked by UTI. In any event, she improved with just supportive measures. Abdominal exam is benign. CT scan of the abdomen on 8/16 without acute abnormalities, right upper quadrant ultrasound does not show any acute abnormalities as well. Diet was slowly advanced, this morning she was able to tolerate  a regular diet. She unfortunately left AGAINST MEDICAL ADVICE as she did not want to wait for discharge instructions.  UTI: Managed with intravenous Rocephin, urine cultures shows multiple bacterial morphotypes-I do not think that repeat urine cultures are warranted-as cultures will likely be sterile as she is already on antibiotics since admission. WIll plan on 3 days of empiric antibiotics. She is afebrile, without leukocytosis and clinically improving with no further vomiting. She left against medical advise on 8/18  Minimally elevated liver enzymes: CT abdomen/abdominal ultrasound negative for any significant abnormalities, suspect that this is probably chronic in nature. Recommend outpatient follow-up with her primary gastroenterologist.  History of chronic pancreatitis: Abdominal pain has improved-CT abdomen without any significant abnormalities. Pancreatic enzyme supplementation was resumed. Note-prior history of alcohol use, claims she's been abstinent for the past 2-3 years.  CAD: Stable, without any chest pain. Troponin negative. 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 She does not have any overt bleeding at this time-anemia panel showed a ferritin of 627 and an iron of 67. Stable for outpatient follow-up with PCP-PCP should consider referral to gastroenterology for repeat colonoscopy  Hypertension: Blood pressure continues to be soft, she was instructed by the RN before she left AMA to continue to hold losartan for now.   Acute kidney injury: Suspect mild prerenal azotemia in a setting of vomiting. Resolved with IV fluids.  Dyslipidemia: Continue statin, continue to follow LFTs in the outpatient setting  Tobacco abuse: Counseled, continue transdermal nicotine  GERD: Continue PPI  History of alcohol abuse: Claims to be  abstinent the past 2-3  years.  Procedures/Studies: None  Discharge Diagnoses:  Active Problems:   HYPERTENSION, BENIGN SYSTEMIC   PANCREATITIS, CHRONIC    ALCOHOL ABUSE, HX OF   Periumbilical hernia   Hyperlipidemia   Abdominal pain, chronic, epigastric - from chronic pancreatitis   Peripheral arterial disease (HCC)   CAD (coronary artery disease), native coronary artery   Type 2 diabetes mellitus ([place])   Pancreatitis   Discharge Instructions:  Activity:  As tolerated with Full fall precautions use walker/cane & assistance as needed  Left AGAINST MEDICAL ADVICE-advice    Allergies  Allergen Reactions  . Diclofenac Sodium Other (See Comments)    REACTION: Chronic renal insufficiency. Patient can use the gel   . Other Other (See Comments)    Blackeyed peas and [place] Beans...break out in hives      Consultations:  None   Other Procedures/Studies: Dg Chest 2 View  Result Date: 05/16/2016 CLINICAL DATA:  Fatigue and weight loss EXAM: CHEST  2 VIEW COMPARISON:  02/05/2016 FINDINGS: The heart size and mediastinal contours are within normal limits. Both lungs are clear. The visualized skeletal structures are unremarkable. Coronary stent is again seen. IMPRESSION: No active cardiopulmonary disease. Electronically Signed   By: Inez Catalina M.D.   On: 05/16/2016 22:29  Ct Abdomen Pelvis W Contrast  Result Date: 06/08/2016 CLINICAL DATA:  Upper abdominal pain, 3 days duration. Nausea and vomiting today. Constipation. EXAM: CT ABDOMEN AND PELVIS WITH CONTRAST TECHNIQUE: Multidetector CT imaging of the abdomen and pelvis was performed using the standard protocol following bolus administration of intravenous contrast. CONTRAST:  35mL ISOVUE-300 IOPAMIDOL (ISOVUE-300) INJECTION 61% COMPARISON:  07/18/2012 FINDINGS: Lower chest: Left effusion layering dependently. Atelectasis of the dependent left lung. Coronary artery stent. Hepatobiliary: Profound fatty change of the liver.  Previous cholecystectomy. Portal and hepatic veins are patent. Pancreas: Chronic pancreatic calcifications secondary to old pancreatitis. No evidence of active pancreatic process. Spleen: Upper limits of normal in size without focal lesion. Adrenals/Urinary Tract: Adrenal glands are normal. Left kidney is normal. Small cysts at the upper pole of the right kidney. Stomach/Bowel: Question edema of the colon wall that could go along with colitis. No evidence of bowel obstruction. Vascular/Lymphatic: Normal Reproductive: Previous hysterectomy.  No pelvic mass. Other: Small amount of ascites. Periumbilical ventral hernia containing only fat. Right upper quadrant anterior abdominal wall hernia containing only fat. Musculoskeletal:  Normal IMPRESSION: Profound fatty change of the liver. 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 Probable edema of the wall of the colon consistent with colitis. Small amount of ascites. Small left pleural effusion layering dependently. Periumbilical hernia containing fat. Right upper quadrant anterior abdominal wall hernia containing only fat. Electronically Signed   By: Nelson Chimes M.D.   On: 06/08/2016 13:03   US Abdomen Limited Ruq  Result Date: 06/08/2016 CLINICAL DATA:  Three day history of right upper quadrant pain with nausea vomiting today. EXAM: US ABDOMEN LIMITED - RIGHT UPPER QUADRANT COMPARISON:  CT scan 06/08/2016 FINDINGS: Gallbladder: Surgically absent. Common bile duct: Diameter: 10 mm. Liver: Coarsening of the echotexture suggests steatosis. IMPRESSION: 1. Status postcholecystectomy. 2. Mild distention of the extrahepatic common duct for patient age. This may be related to prior cholecystectomy. Correlation with liver function test may prove helpful. Electronically Signed   By: Misty Stanley M.D.   On: 06/08/2016 18:53      TODAY-DAY OF DISCHARGE:  Subjective:   [name] Foley today has no headache,no chest abdominal pain,no new weakness  tingling or numbness, feels much better wants to  go home today.   Objective:   Blood pressure 96/69, pulse (!) 104, temperature 98.8 F (37.1 C), temperature source Oral, resp. rate 16, height 5\' 2"  (1.575 m), weight 60.6 kg (133 lb 9.6 oz), SpO2 99 %.  Intake/Output Summary (Last 24 hours) at 06/10/16 1021 Last data filed at 06/10/16 0921  Gross per 24 hour  Intake             1200 ml  Output             1600 ml  Net             -400 ml   Filed Weights   06/08/16 0925 06/08/16 1823  Weight: 61.7 kg (136 lb) 60.6 kg (133 lb 9.6 oz)    Exam: Awake Alert,  No new F.N deficits, Normal affect [place].AT,PERRAL Supple Neck,No JVD, No cervical lymphadenopathy appriciated.  Symmetrical Chest wall movement, Good air movement bilaterally, CTAB RRR,No Gallops,Rubs or new Murmurs, No Parasternal Heave +ve B.Sounds, Abd Soft, Mildly tender in the epigastric area, No organomegaly appriciated, No rebound -guarding or rigidity. No Cyanosis, Clubbing or edema, No new Rash or bruise   PERTINENT RADIOLOGIC STUDIES: Dg Chest 2 View  Result Date: 05/16/2016 CLINICAL DATA:  Fatigue and weight loss EXAM: CHEST  2 VIEW COMPARISON:  02/05/2016 FINDINGS: The heart size and mediastinal contours are within normal limits. Both lungs are clear. The visualized skeletal structures are unremarkable. Coronary stent is again seen. IMPRESSION: No active cardiopulmonary disease. Electronically Signed   By: Inez Catalina M.D.   On: 05/16/2016 22:29  Ct Abdomen Pelvis W Contrast  Result Date: 06/08/2016 CLINICAL DATA:  Upper abdominal pain, 3 days duration. Nausea and vomiting today. Constipation. EXAM: CT ABDOMEN AND PELVIS WITH CONTRAST TECHNIQUE: Multidetector CT imaging of the abdomen and pelvis was performed using the standard protocol following bolus administration of intravenous contrast. CONTRAST:  59mL ISOVUE-300 IOPAMIDOL (ISOVUE-300) INJECTION 61% COMPARISON:  07/18/2012 FINDINGS: Lower chest: Left effusion  layering dependently. Atelectasis of the dependent left lung. Coronary artery stent. Hepatobiliary: Profound fatty change of the liver. Previous cholecystectomy. Portal and hepatic veins are patent. Pancreas: Chronic pancreatic calcifications secondary to old pancreatitis. No evidence of active pancreatic process. Spleen: Upper limits of normal in size without focal lesion. Adrenals/Urinary Tract: Adrenal glands are normal. Left kidney is normal. Small cysts at the upper pole of the right kidney. Stomach/Bowel: Question edema of the colon wall that could go along with colitis. No evidence of bowel obstruction. Vascular/Lymphatic: Normal Reproductive: Previous hysterectomy.  No pelvic mass. Other: Small amount of ascites. Periumbilical ventral hernia containing only fat. Right upper quadrant anterior abdominal wall hernia containing only fat. Musculoskeletal:  Normal IMPRESSION: Profound fatty change of the liver. 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 Probable edema of the wall of the colon consistent with colitis. Small amount of ascites. Small left pleural effusion layering dependently. Periumbilical hernia containing fat. Right upper quadrant anterior abdominal wall hernia containing only fat. Electronically Signed   By: Nelson Chimes M.D.   On: 06/08/2016 13:03   US Abdomen Limited Ruq  Result Date: 06/08/2016 CLINICAL DATA:  Three day history of right upper quadrant pain with nausea vomiting today. EXAM: US ABDOMEN LIMITED - RIGHT UPPER QUADRANT COMPARISON:  CT scan 06/08/2016 FINDINGS: Gallbladder: Surgically absent. Common bile duct: Diameter: 10 mm. Liver: Coarsening of the echotexture suggests steatosis. IMPRESSION: 1. Status postcholecystectomy. 2. Mild distention of the extrahepatic common duct for patient age. This may be related to prior cholecystectomy.  Correlation with liver function test may prove helpful. Electronically Signed   By: Misty Stanley M.D.   On: 06/08/2016  18:53     PERTINENT LAB RESULTS: CBC:  Recent Labs  06/09/16 0222 06/09/16 0952  WBC 5.0 8.9  HGB 6.9* 8.2*  8.3*  HCT 19.6* 23.9*  25.0*  PLT 163 231   CMET CMP     Component Value Date/Time   NA 136 06/09/2016 0222   K 3.7 06/09/2016 0222   CL 109 06/09/2016 0222   CO2 18 (L) 06/09/2016 0222   GLUCOSE 74 06/09/2016 0222   BUN <5 (L) 06/09/2016 0222   CREATININE 1.01 (H) 06/09/2016 0222   CALCIUM 7.0 (L) 06/09/2016 0222   PROT 5.2 (L) 06/09/2016 0222   ALBUMIN 1.5 (L) 06/09/2016 0222   AST 49 (H) 06/09/2016 0222   ALT 17 06/09/2016 0222   ALKPHOS 131 (H) 06/09/2016 0222   BILITOT 2.7 (H) 06/09/2016 0222   GFRNONAA 56 (L) 06/09/2016 0222   GFRAA >60 06/09/2016 0222    GFR Estimated Creatinine Clearance: 45.7 mL/min (by C-G formula based on SCr of 1.01 mg/dL).  Recent Labs  06/08/16 0958  LIPASE 11    Recent Labs  06/08/16 1642  TROPONINI <0.03   Invalid input(s): POCBNP No results for input(s): DDIMER in the last 72 hours. No results for input(s): HGBA1C in the last 72 hours. No results for input(s): CHOL, HDL, LDLCALC, TRIG, CHOLHDL, LDLDIRECT in the last 72 hours. No results for input(s): TSH, T4TOTAL, T3FREE, THYROIDAB in the last 72 hours.  Invalid input(s): FREET3  Recent Labs  06/08/16 1642  VITAMINB12 1,955*  FOLATE 14.9  FERRITIN 627*  TIBC NOT CALCULATED  IRON 61  RETICCTPCT 1.0   Coags: No results for input(s): INR in the last 72 hours.  Invalid input(s): PT Microbiology: Recent Results (from the past 240 hour(s))  Urine culture     Status: Abnormal   Collection Time: 06/08/16 11:44 AM  Result Value Ref Range Status   Specimen Description URINE, RANDOM  Final   Special Requests NONE  Final   Culture MULTIPLE SPECIES PRESENT, SUGGEST RECOLLECTION (A)  Final   Report Status 06/09/2016 FINAL  Final    FURTHER DISCHARGE INSTRUCTIONS:  Get Medicines reviewed and adjusted: Please take all your medications with you for your  next visit with your Primary MD  Laboratory/radiological data: Please request your Primary MD to go over all hospital tests and procedure/radiological results at the follow up, please ask your Primary MD to get all Hospital records sent to his/her office.  In some cases, they will be blood work, cultures and biopsy results pending at the time of your discharge. Please request that your primary care M.D. goes through all the records of your hospital data and follows up on these results.  Also Note the following: If you experience worsening of your admission symptoms, develop shortness of breath, life threatening emergency, suicidal or homicidal thoughts you must seek medical attention immediately by calling 911 or calling your MD immediately  if symptoms less severe.  You must read complete instructions/literature along with all the possible adverse reactions/side effects for all the Medicines you take and that have been prescribed to you. Take any new Medicines after you have completely understood and accpet all the possible adverse reactions/side effects.   Do not drive when taking Pain medications or sleeping medications (Benzodaizepines)  Do not take more than prescribed Pain, Sleep and Anxiety Medications. It is not advisable to combine  anxiety,sleep and pain medications without talking with your primary care practitioner  Special Instructions: If you have smoked or chewed Tobacco  in the last 2 yrs please stop smoking, stop any regular Alcohol  and or any Recreational drug use.  Wear Seat belts while driving.  Please note: You were cared for by a hospitalist during your hospital stay. Once you are discharged, your primary care physician will handle any further medical issues. Please note that NO REFILLS for any discharge medications will be authorized once you are discharged, as it is imperative that you return to your primary care physician (or establish a relationship with a primary care  physician if you do not have one) for your post hospital discharge needs so that they can reassess your need for medications and monitor your lab values.  Total Time spent coordinating discharge including counseling, education and face to face time equals 25 minutes.  SignedOren Binet 06/10/2016 10:21 AM

## 2016-06-10 NOTE — Progress Notes (Signed)
Patient discharged AMA, refused to wait for doctor to sign her out with instructions.

## 2016-06-14 ENCOUNTER — Inpatient Hospital Stay (HOSPITAL_COMMUNITY)
Admission: EM | Admit: 2016-06-14 | Discharge: 2016-06-17 | DRG: 438 | Disposition: A | Discharge status: Home Health | Payer: Medicare Other | Attending: Internal Medicine | Admitting: Internal Medicine

## 2016-06-14 ENCOUNTER — Encounter (HOSPITAL_COMMUNITY): Payer: Self-pay | Admitting: *Deleted

## 2016-06-14 ENCOUNTER — Emergency Department (HOSPITAL_COMMUNITY): Payer: Medicare Other

## 2016-06-14 DIAGNOSIS — K709 Alcoholic liver disease, unspecified: Secondary | ICD-10-CM | POA: Diagnosis not present

## 2016-06-14 DIAGNOSIS — Z87891 Personal history of nicotine dependence: Secondary | ICD-10-CM

## 2016-06-14 DIAGNOSIS — K219 Gastro-esophageal reflux disease without esophagitis: Secondary | ICD-10-CM | POA: Diagnosis present

## 2016-06-14 DIAGNOSIS — M7989 Other specified soft tissue disorders: Secondary | ICD-10-CM | POA: Diagnosis present

## 2016-06-14 DIAGNOSIS — Z7902 Long term (current) use of antithrombotics/antiplatelets: Secondary | ICD-10-CM

## 2016-06-14 DIAGNOSIS — Z9049 Acquired absence of other specified parts of digestive tract: Secondary | ICD-10-CM | POA: Diagnosis not present

## 2016-06-14 DIAGNOSIS — I119 Hypertensive heart disease without heart failure: Secondary | ICD-10-CM | POA: Diagnosis present

## 2016-06-14 DIAGNOSIS — E785 Hyperlipidemia, unspecified: Secondary | ICD-10-CM | POA: Diagnosis present

## 2016-06-14 DIAGNOSIS — I252 Old myocardial infarction: Secondary | ICD-10-CM

## 2016-06-14 DIAGNOSIS — F1010 Alcohol abuse, uncomplicated: Secondary | ICD-10-CM | POA: Diagnosis present

## 2016-06-14 DIAGNOSIS — Z955 Presence of coronary angioplasty implant and graft: Secondary | ICD-10-CM | POA: Diagnosis not present

## 2016-06-14 DIAGNOSIS — I2510 Atherosclerotic heart disease of native coronary artery without angina pectoris: Secondary | ICD-10-CM | POA: Diagnosis present

## 2016-06-14 DIAGNOSIS — K860 Alcohol-induced chronic pancreatitis: Secondary | ICD-10-CM

## 2016-06-14 DIAGNOSIS — R160 Hepatomegaly, not elsewhere classified: Secondary | ICD-10-CM | POA: Diagnosis present

## 2016-06-14 DIAGNOSIS — E119 Type 2 diabetes mellitus without complications: Secondary | ICD-10-CM | POA: Diagnosis present

## 2016-06-14 DIAGNOSIS — K449 Diaphragmatic hernia without obstruction or gangrene: Secondary | ICD-10-CM | POA: Diagnosis present

## 2016-06-14 DIAGNOSIS — K8681 Exocrine pancreatic insufficiency: Secondary | ICD-10-CM | POA: Diagnosis present

## 2016-06-14 DIAGNOSIS — Z888 Allergy status to other drugs, medicaments and biological substances status: Secondary | ICD-10-CM

## 2016-06-14 DIAGNOSIS — I739 Peripheral vascular disease, unspecified: Secondary | ICD-10-CM | POA: Diagnosis present

## 2016-06-14 DIAGNOSIS — Z7982 Long term (current) use of aspirin: Secondary | ICD-10-CM | POA: Diagnosis not present

## 2016-06-14 DIAGNOSIS — K7030 Alcoholic cirrhosis of liver without ascites: Secondary | ICD-10-CM | POA: Diagnosis present

## 2016-06-14 DIAGNOSIS — Z79899 Other long term (current) drug therapy: Secondary | ICD-10-CM

## 2016-06-14 DIAGNOSIS — D509 Iron deficiency anemia, unspecified: Secondary | ICD-10-CM | POA: Diagnosis present

## 2016-06-14 DIAGNOSIS — K7040 Alcoholic hepatic failure without coma: Secondary | ICD-10-CM | POA: Diagnosis present

## 2016-06-14 DIAGNOSIS — R600 Localized edema: Secondary | ICD-10-CM | POA: Diagnosis not present

## 2016-06-14 DIAGNOSIS — K861 Other chronic pancreatitis: Principal | ICD-10-CM | POA: Diagnosis present

## 2016-06-14 DIAGNOSIS — E43 Unspecified severe protein-calorie malnutrition: Secondary | ICD-10-CM | POA: Diagnosis present

## 2016-06-14 DIAGNOSIS — K7010 Alcoholic hepatitis without ascites: Secondary | ICD-10-CM | POA: Diagnosis present

## 2016-06-14 DIAGNOSIS — R5381 Other malaise: Secondary | ICD-10-CM | POA: Diagnosis present

## 2016-06-14 DIAGNOSIS — Z6824 Body mass index (BMI) 24.0-24.9, adult: Secondary | ICD-10-CM | POA: Diagnosis not present

## 2016-06-14 DIAGNOSIS — Z90710 Acquired absence of both cervix and uterus: Secondary | ICD-10-CM

## 2016-06-14 DIAGNOSIS — Z91018 Allergy to other foods: Secondary | ICD-10-CM

## 2016-06-14 LAB — CBC
HCT: 23.6 % — ABNORMAL LOW (ref 36.0–46.0)
Hemoglobin: 8.4 g/dL — ABNORMAL LOW (ref 12.0–15.0)
MCH: 23.3 pg — AB (ref 26.0–34.0)
MCHC: 35.6 g/dL (ref 30.0–36.0)
MCV: 65.6 fL — ABNORMAL LOW (ref 78.0–100.0)
PLATELETS: 181 10*3/uL (ref 150–400)
RBC: 3.6 MIL/uL — ABNORMAL LOW (ref 3.87–5.11)
RDW: 17.5 % — ABNORMAL HIGH (ref 11.5–15.5)
WBC: 6.4 10*3/uL (ref 4.0–10.5)

## 2016-06-14 LAB — I-STAT CG4 LACTIC ACID, ED: LACTIC ACID, VENOUS: 2.63 mmol/L — AB (ref 0.5–1.9)

## 2016-06-14 LAB — URINALYSIS, ROUTINE W REFLEX MICROSCOPIC
Glucose, UA: NEGATIVE mg/dL
HGB URINE DIPSTICK: NEGATIVE
Ketones, ur: NEGATIVE mg/dL
Leukocytes, UA: NEGATIVE
Nitrite: POSITIVE — AB
PROTEIN: NEGATIVE mg/dL
Specific Gravity, Urine: 1.013 (ref 1.005–1.030)
pH: 5.5 (ref 5.0–8.0)

## 2016-06-14 LAB — COMPREHENSIVE METABOLIC PANEL
ALBUMIN: 2.2 g/dL — AB (ref 3.5–5.0)
ALK PHOS: 180 U/L — AB (ref 38–126)
ALT: 31 U/L (ref 14–54)
AST: 96 U/L — ABNORMAL HIGH (ref 15–41)
Anion gap: 7 (ref 5–15)
CALCIUM: 7.6 mg/dL — AB (ref 8.9–10.3)
CO2: 18 mmol/L — AB (ref 22–32)
CREATININE: 0.97 mg/dL (ref 0.44–1.00)
Chloride: 112 mmol/L — ABNORMAL HIGH (ref 101–111)
GFR calc Af Amer: >60 mL/min (ref >60)
GFR calc non Af Amer: 59 mL/min — ABNORMAL LOW (ref >60)
GLUCOSE: 74 mg/dL (ref 65–99)
Potassium: 4.7 mmol/L (ref 3.5–5.1)
SODIUM: 137 mmol/L (ref 135–145)
Total Bilirubin: 4.1 mg/dL — ABNORMAL HIGH (ref 0.3–1.2)
Total Protein: 7.1 g/dL (ref 6.5–8.1)

## 2016-06-14 LAB — URINE MICROSCOPIC-ADD ON

## 2016-06-14 LAB — LIPASE, BLOOD: Lipase: 11 U/L (ref 11–51)

## 2016-06-14 MED ORDER — PANCRELIPASE (LIP-PROT-AMYL) 36000-114000 UNITS PO CPEP
72000.0000 [IU] | ORAL_CAPSULE | Freq: Three times a day (TID) | ORAL | Status: DC
  Administered 2016-06-15: 72000 [IU] via ORAL
  Administered 2016-06-15: 36000 [IU] via ORAL
  Administered 2016-06-16 – 2016-06-17 (×4): 72000 [IU] via ORAL
  Filled 2016-06-14 (×9): qty 2

## 2016-06-14 MED ORDER — POLYETHYLENE GLYCOL 3350 17 G PO PACK: 17.0000 g | PACK | Freq: Every day | ORAL | Status: DC | PRN

## 2016-06-14 MED ORDER — ONDANSETRON HCL 4 MG PO TABS: 4.0000 mg | ORAL_TABLET | Freq: Four times a day (QID) | ORAL | Status: DC | PRN

## 2016-06-14 MED ORDER — ASPIRIN EC 81 MG PO TBEC
81.0000 mg | DELAYED_RELEASE_TABLET | Freq: Every day | ORAL | Status: DC
  Administered 2016-06-15 – 2016-06-17 (×3): 81 mg via ORAL
  Filled 2016-06-14 (×3): qty 1

## 2016-06-14 MED ORDER — NICOTINE 14 MG/24HR TD PT24
14.0000 mg | MEDICATED_PATCH | Freq: Every day | TRANSDERMAL | Status: DC
  Administered 2016-06-14 – 2016-06-16 (×3): 14 mg via TRANSDERMAL
  Filled 2016-06-14 (×3): qty 1

## 2016-06-14 MED ORDER — ONDANSETRON HCL 4 MG/2ML IJ SOLN: 4.0000 mg | Freq: Four times a day (QID) | INTRAMUSCULAR | Status: DC | PRN

## 2016-06-14 MED ORDER — ACETAMINOPHEN 650 MG RE SUPP: 650.0000 mg | Freq: Four times a day (QID) | RECTAL | Status: DC | PRN

## 2016-06-14 MED ORDER — QUETIAPINE FUMARATE 25 MG PO TABS
50.0000 mg | ORAL_TABLET | Freq: Every day | ORAL | Status: DC
  Administered 2016-06-14 – 2016-06-16 (×3): 50 mg via ORAL
  Filled 2016-06-14 (×3): qty 2

## 2016-06-14 MED ORDER — ACETAMINOPHEN 325 MG PO TABS: 650.0000 mg | ORAL_TABLET | Freq: Four times a day (QID) | ORAL | Status: DC | PRN

## 2016-06-14 MED ORDER — MIRTAZAPINE 15 MG PO TABS
15.0000 mg | ORAL_TABLET | Freq: Every day | ORAL | Status: DC
  Administered 2016-06-14 – 2016-06-16 (×3): 15 mg via ORAL
  Filled 2016-06-14 (×3): qty 1

## 2016-06-14 MED ORDER — PANTOPRAZOLE SODIUM 40 MG PO TBEC
40.0000 mg | DELAYED_RELEASE_TABLET | Freq: Every day | ORAL | Status: DC
  Administered 2016-06-15 – 2016-06-17 (×3): 40 mg via ORAL
  Filled 2016-06-14 (×3): qty 1

## 2016-06-14 MED ORDER — CLOPIDOGREL BISULFATE 75 MG PO TABS
75.0000 mg | ORAL_TABLET | Freq: Every day | ORAL | Status: DC
  Administered 2016-06-15 – 2016-06-17 (×3): 75 mg via ORAL
  Filled 2016-06-14 (×3): qty 1

## 2016-06-14 MED ORDER — ATORVASTATIN CALCIUM 40 MG PO TABS
40.0000 mg | ORAL_TABLET | Freq: Every day | ORAL | Status: DC
  Administered 2016-06-16: 40 mg via ORAL
  Filled 2016-06-14 (×3): qty 1

## 2016-06-14 MED ORDER — PANCRELIPASE (LIP-PROT-AMYL) 12000-38000 UNITS PO CPEP
24000.0000 [IU] | ORAL_CAPSULE | ORAL | Status: DC | PRN
  Filled 2016-06-14: qty 2

## 2016-06-14 NOTE — ED Notes (Addendum)
Report called to 5E RN

## 2016-06-14 NOTE — ED Provider Notes (Signed)
Patient signed out to me by Dr. Alvino Chapel and she continues to feel weak in her legs. Urinalysis negative for infection. Will admit for further workup   [name] Leigh, MD 06/14/16 1925

## 2016-06-14 NOTE — ED Notes (Signed)
IV team at bedside 

## 2016-06-14 NOTE — ED Triage Notes (Signed)
Pt complains of bilateral leg swelling, diarrhea, lack of appetite since Friday. Pt denies abdominal pain or emesis.

## 2016-06-14 NOTE — ED Provider Notes (Signed)
[facility] DEPT Provider Note   CSN: [id number] Arrival date & time: 06/14/16  1350     History   Chief Complaint Chief Complaint  Patient presents with  . Leg Swelling  . Diarrhea    HPI [name] Foley is a 70 y.o. female.  The history is provided by the patient.  Diarrhea   The stool consistency is described as watery.  Patient states that she feels weak all over. Recent admission to hospital for dehydration and acute on chronic abdominal pain. Thought that it may be an exacerbation of her chronic pancreatitis. Since she left the hospital she's not doing well. Decreased appetite. Has had nausea without vomiting. She has had diarrhea. No blood in the diarrhea. His somewhat watery. She continues to have abdominal pain. States it is the same abdominal pain and she been having. States she's had chills. No fevers. She also has increased swelling in her legs. States she's been too weak to get up and move around. Discharged 4 days ago.  Past Medical History:  Diagnosis Date  . Alcohol abuse    Abstinent since 2009  . Chronic pancreatitis ([place])   . Colon adenoma 03/2012   Diminutive cecal  . Common bile duct stricture   . Coronary artery disease    DES x 2 to LAD 2007, DES to LAD 07/2012  . Depression   . Essential hypertension   . Fatty liver   . GERD (gastroesophageal reflux disease)   . GERD (gastroesophageal reflux disease)   . Hemorrhoids   . Hiatal hernia   . History of pneumonia 11/2011  . Hyperlipidemia   . PAD (peripheral artery disease) (HCC)    Occluded right external iliac artery   . Psoriasis   . ST elevation myocardial infarction (STEMI) of anterior wall ([place]) 07/2012  . Type 2 diabetes mellitus [facility])     Patient Active Problem List   Diagnosis Date Noted  . Leg swelling 06/14/2016  . Alcohol abuse 06/14/2016  . Alcoholic liver disease ([place]) 06/14/2016  . Hepatomegaly 06/14/2016  . Hyperbilirubinemia 06/14/2016  . History of MI (myocardial  infarction) 06/14/2016  . History of coronary artery stent placement 06/14/2016  . Debility 06/14/2016  . Pancreatitis 06/08/2016  . Type 2 diabetes mellitus ([place]) 08/28/2015  . CAD (coronary artery disease), native coronary artery 08/27/2015  . Peripheral arterial disease ([place]) 09/23/2014  . Abdominal pain, chronic, epigastric - from chronic pancreatitis 05/26/2014  . Hyperlipidemia   . Pruritus ani 09/24/2012  . Internal hemorrhoids with pain & intermittent prolapse s/p THD hem ligation/pexy 09/17/2013 09/24/2012  . STEMI (ST elevation myocardial infarction), with PCI with DES to mid LAD at site of previous stent 08/11/2012  . Periumbilical hernia [id number]  . Personal history of colonic polyp-adenoma 04/09/2012  . Chronic diarrhea - ? steatorrhea vs. IBS or both 04/09/2012  . Nasal congestion 04/06/2012  . Cough 04/06/2012  . CAP (community acquired pneumonia) 12/06/2011  . Common bile duct (CBD) stenosis? 12/03/2011  . PANCREATITIS, CHRONIC  08/13/2008  . PSORIASIS 08/13/2008  . ALCOHOL ABUSE, HX OF 03/21/2008  . Hyperlipidemia with target LDL less than 100 01/25/2007  . Coronary atherosclerosis 01/25/2007  . TOBACCO DEPENDENCE 12/21/2006  . HYPERTENSION, BENIGN SYSTEMIC 12/21/2006  . INSOMNIA NOS 12/21/2006    Past Surgical History:  Procedure Laterality Date  . ABDOMINAL HYSTERECTOMY  1994  . CHOLECYSTECTOMY  1976  . COLONOSCOPY     multiple  . ERCP  07/25/2012   Procedure: ENDOSCOPIC RETROGRADE CHOLANGIOPANCREATOGRAPHY (ERCP);  Surgeon: Gatha Mayer, MD;  Location: [facility];  Service: Endoscopy;  Laterality: N/A;  with stent  . ESOPHAGOGASTRODUODENOSCOPY  10/25/2012   Procedure: ESOPHAGOGASTRODUODENOSCOPY (EGD);  Surgeon: Gatha Mayer, MD;  Location: [facility];  Service: Endoscopy;  Laterality: N/A;  EGD with stent removal using ERCP scope  . EUS  01/05/2012   Procedure: UPPER ENDOSCOPIC ULTRASOUND (EUS) LINEAR;  Surgeon: Milus Banister, MD;  Location: [facility]  ENDOSCOPY;  Service: Endoscopy;  Laterality: N/A;  radial linear  . HEMORRHOID SURGERY  09/17/2013   THD hem ligation/pexy  . LEFT HEART CATH N/A 08/10/2012   Procedure: LEFT HEART CATH;  Surgeon: Peter M Martinique, MD;  Location: [facility] CATH LAB;  Service: Cardiovascular;  Laterality: N/A;  . PERCUTANEOUS CORONARY STENT INTERVENTION (PCI-S)  08/10/2012   Procedure: PERCUTANEOUS CORONARY STENT INTERVENTION (PCI-S);  Surgeon: Peter M Martinique, MD;  Location: [facility] CATH LAB;  Service: Cardiovascular;;  . STRESS MYOCARDIAL PERFUSION  04/20/10   NORMAL PATTERN OF PERFUSION IN ALL REGIONS. LV SIZE IS NORMAL. EF= 61%. NO SIGN ISCHEMIA DEMONSTRATED.  Marland Kitchen TRANSTHORACIC ECHOCARDIOGRAM  04/20/10   PROXIMAL SEPTAL THICKENING IS NOTED. LV SYSTOLIC FUNCTION IS NORMAL. EF= >55%. LEFT ATRIAL SIZE IS NORMAL. RIGHT VENTRICULAR SYSTOLIC PRESSURE IS NORMAL. AV APPEARS TO BE MILDLY SCLEROTIC. NO AS.    OB History    No data available       Home Medications    Prior to Admission medications   Medication Sig Start Date End Date Taking? Authorizing Provider  acetaminophen (TYLENOL 8 HOUR) 650 MG CR tablet Take 1 tablet (650 mg total) by mouth every 8 (eight) hours as needed for pain. 02/05/16  Yes Varney Biles, MD  aspirin EC 81 MG EC tablet Take 1 tablet (81 mg total) by mouth daily. 08/13/12  Yes Isaiah Serge, NP  atorvastatin (LIPITOR) 40 MG tablet TAKE ONE (1) TABLET EACH DAY AT 6PM Patient taking differently: Take 40 mg by mouth each day 09/16/13  Yes Lorretta Harp, MD  clonazePAM (KLONOPIN) 1 MG tablet Take 1 tablet (1 mg total) by mouth at bedtime. 09/02/15  Yes Gatha Mayer, MD  clopidogrel (PLAVIX) 75 MG tablet Take 75 mg by mouth daily.     Yes Historical Provider, MD  diclofenac sodium (VOLTAREN) 1 % GEL Apply 4 g topically 4 (four) times daily. Patient taking differently: Apply 4 g topically 2 (two) times daily as needed (for pain).  09/02/15  Yes Gatha Mayer, MD  mirtazapine (REMERON) 15 MG tablet Take  1 tablet (15 mg total) by mouth at bedtime. 07/13/12  Yes Gatha Mayer, MD  nitroGLYCERIN (NITROSTAT) 0.4 MG SL tablet Place 1 tablet (0.4 mg total) under the tongue every 5 (five) minutes x 3 doses as needed for chest pain. 09/02/15  Yes Lorretta Harp, MD  omeprazole (PRILOSEC) 40 MG capsule TAKE ONE CAPSULE BY MOUTH DAILY Patient taking differently: TAKE 40 MG BY MOUTH DAILY 01/21/16  Yes Gatha Mayer, MD  Pancrelipase, Lip-Prot-Amyl, (CREON) 24000 UNITS CPEP TAKE 3 CAPSULES 3 TIMES A DAY WITH MEALS AND TAKE ONE CAPSULE WITH SNACKS Patient taking differently: Take 24,000-72,000 Units by mouth See admin instructions. Take 72000 units by mouth 3 times daily with meals and take 24000 units by mouth with snack. 09/02/15  Yes Gatha Mayer, MD  polyethylene glycol [facility] / GLYCOLAX) packet Take 17 g by mouth daily as needed for moderate constipation.   Yes Historical Provider, MD  QUEtiapine (SEROQUEL) 50 MG tablet  Take 50 mg by mouth at bedtime.   Yes Historical Provider, MD  cephALEXin (KEFLEX) 500 MG capsule Take 1 capsule (500 mg total) by mouth 2 (two) times daily. Patient not taking: Reported on 06/08/2016 05/16/16   Dorie Rank, MD  HYDROcodone-acetaminophen (NORCO/VICODIN) 5-325 MG tablet Take 1 tablet by mouth every 6 (six) hours as needed. Patient not taking: Reported on 06/08/2016 02/05/16   Varney Biles, MD  methocarbamol (ROBAXIN) 500 MG tablet Take 1 tablet (500 mg total) by mouth 2 (two) times daily. Patient not taking: Reported on 06/08/2016 02/05/16   Varney Biles, MD  nicotine (NICODERM CQ) 14 mg/24hr patch Place 1 patch onto the skin daily. Patient not taking: Reported on 02/05/2016 04/03/13   Lorretta Harp, MD  oxyCODONE (OXY IR/ROXICODONE) 5 MG immediate release tablet Take 1-2 tablets (5-10 mg total) by mouth every 6 (six) hours as needed for moderate pain, severe pain or breakthrough pain. Patient not taking: Reported on 06/08/2016 09/02/15   Gatha Mayer, MD    Family  History Family History  Problem Relation Age of Onset  . Colon cancer Mother     Colon resection  . Diabetes Mother   . Cirrhosis Father   . Coronary artery disease Brother   . Renal Disease Brother   . Malignant hyperthermia Neg Hx     Social History Social History  Substance Use Topics  . Smoking status: Former Smoker    Packs/day: 0.50    Years: 15.00    Types: Cigarettes  . Smokeless tobacco: Never Used     Comment: 2 cigarettes a day  . Alcohol use No     Comment: Past h/o alcohol abuse, quit 4/09     Allergies   Diclofenac sodium and Other   Review of Systems Review of Systems  Gastrointestinal: Positive for diarrhea.     Physical Exam Updated Vital Signs BP 92/66   Pulse 92   Temp 98.7 F (37.1 C) (Oral)   Resp 18   Ht 5\' 2"  (1.575 m)   Wt 138 lb 0.1 oz (62.6 kg)   SpO2 100%   BMI 25.24 kg/m   Physical Exam  Constitutional: She appears well-developed.  HENT:  Head: Atraumatic.  Eyes: EOM are normal.  Neck: No JVD present.  Cardiovascular:  Mild tachycardia  Pulmonary/Chest: No respiratory distress. She has no wheezes. She has no rales.  Abdominal:  Somewhat diffuse tenderness, worse in the lower abdomen.  Musculoskeletal: She exhibits edema.  Moderate edema to bilateral ankles and feet.  Neurological: She is alert.  Skin: Skin is warm.  Psychiatric: She has a normal mood and affect.     ED Treatments / Results  Labs (all labs ordered are listed, but only abnormal results are displayed) Labs Reviewed  COMPREHENSIVE METABOLIC PANEL - Abnormal; Notable for the following:       Result Value   Chloride 112 (*)    CO2 18 (*)    BUN <5 (*)    Calcium 7.6 (*)    Albumin 2.2 (*)    AST 96 (*)    Alkaline Phosphatase 180 (*)    Total Bilirubin 4.1 (*)    GFR calc non Af Amer 59 (*)    All other components within normal limits  CBC - Abnormal; Notable for the following:    RBC 3.60 (*)    Hemoglobin 8.4 (*)    HCT 23.6 (*)    MCV  65.6 (*)    MCH 23.3 (*)  RDW 17.5 (*)    All other components within normal limits  URINALYSIS, ROUTINE W REFLEX MICROSCOPIC (NOT AT [facility]) - Abnormal; Notable for the following:    Color, Urine ORANGE (*)    Bilirubin Urine MODERATE (*)    Nitrite POSITIVE (*)    All other components within normal limits  URINE MICROSCOPIC-ADD ON - Abnormal; Notable for the following:    Squamous Epithelial / LPF 0-5 (*)    Bacteria, UA RARE (*)    Casts HYALINE CASTS (*)    All other components within normal limits  PROTIME-INR - Abnormal; Notable for the following:    Prothrombin Time 26.7 (*)    All other components within normal limits  COMPREHENSIVE METABOLIC PANEL - Abnormal; Notable for the following:    Chloride 113 (*)    CO2 20 (*)    BUN 5 (*)    Calcium 7.3 (*)    Total Protein 5.7 (*)    Albumin 1.7 (*)    AST 81 (*)    Alkaline Phosphatase 151 (*)    Total Bilirubin 3.7 (*)    All other components within normal limits  CBC - Abnormal; Notable for the following:    WBC 3.8 (*)    RBC 2.90 (*)    Hemoglobin 6.7 (*)    HCT 19.1 (*)    MCV 65.9 (*)    MCH 23.1 (*)    RDW 17.2 (*)    Platelets 137 (*)    All other components within normal limits  FERRITIN - Abnormal; Notable for the following:    Ferritin 486 (*)    All other components within normal limits  I-STAT CG4 LACTIC ACID, ED - Abnormal; Notable for the following:    Lactic Acid, Venous 2.63 (*)    All other components within normal limits  LIPASE, BLOOD  FOLATE  IRON AND TIBC  OCCULT BLOOD X 1 CARD TO LAB, STOOL  TYPE AND SCREEN  PREPARE RBC (CROSSMATCH)  ABO/RH    EKG  EKG Interpretation None       Radiology Dg Chest 2 View  Result Date: 06/14/2016 CLINICAL DATA:  Bilateral ankle edema, cough EXAM: CHEST  2 VIEW COMPARISON:  05/16/2016 FINDINGS: There is no focal parenchymal opacity. There is no pleural effusion or pneumothorax. The heart and mediastinal contours are unremarkable. The osseous  structures are unremarkable. IMPRESSION: No active cardiopulmonary disease. Electronically Signed   By: Kathreen Devoid   On: 06/14/2016 17:03    Procedures Procedures (including critical care time)  Medications Ordered in ED Medications  nicotine (NICODERM CQ - dosed in mg/24 hours) patch 14 mg (14 mg Transdermal Patch Applied 06/14/16 2347)  polyethylene glycol (MIRALAX / GLYCOLAX) packet 17 g (not administered)  QUEtiapine (SEROQUEL) tablet 50 mg (50 mg Oral Given 06/14/16 2342)  pantoprazole (PROTONIX) EC tablet 40 mg (40 mg Oral Given 06/15/16 1119)  lipase/protease/amylase (CREON) capsule 72,000 Units (72,000 Units Oral Given 06/15/16 1335)  atorvastatin (LIPITOR) tablet 40 mg (not administered)  aspirin EC tablet 81 mg (81 mg Oral Given 06/15/16 1120)  mirtazapine (REMERON) tablet 15 mg (15 mg Oral Given 06/14/16 2342)  clopidogrel (PLAVIX) tablet 75 mg (75 mg Oral Given 06/15/16 1120)  acetaminophen (TYLENOL) tablet 650 mg (not administered)    Or  acetaminophen (TYLENOL) suppository 650 mg (not administered)  ondansetron (ZOFRAN) tablet 4 mg (not administered)    Or  ondansetron (ZOFRAN) injection 4 mg (not administered)  lipase/protease/amylase (CREON) capsule 24,000 Units (not administered)  furosemide (LASIX) injection 40 mg (40 mg Intravenous Given 06/15/16 1336)  0.9 %  sodium chloride infusion ( Intravenous New Bag/Given 06/15/16 0830)     Initial Impression / Assessment and Plan / ED Course  I have reviewed the triage vital signs and the nursing notes.  Pertinent labs & imaging results that were available during my care of the patient were reviewed by me and considered in my medical decision making (see chart for details).  Clinical Course    Patient with leg swelling diarrhea and abdominal pain. History of same. Has recent admission. Lab work is abnormal but is stable. Urine pending. Care turned over to Dr. Zenia Resides.  Final Clinical Impressions(s) / ED Diagnoses   Final  diagnoses:  Leg swelling    New Prescriptions Current Discharge Medication List       Davonna Belling, MD 06/15/16 [phone]

## 2016-06-14 NOTE — H&P (Signed)
[facility] History and Physical  [name] Hinks [id number] DOB: 1946-10-17 DOA: 06/14/2016  Referring physician: Dr Zenia Resides PCP: Philis Fendt, MD   Chief Complaint: Gen weak, leg swelling  HPI: [name] Foley is a 70 y.o. female with hx of MI/ CAD w stent, tobacco, chron pancreatitis , hx etoh abuse and HL.  Pt admitted here for 3 days 8/16- 8.18/17 w acute on chron abd pain, not sure etiology.  CT abd and [place] unrevealing per DC summary, poss d/t UTI (mult types on Cx) vs pancreatitis. Takes panc enzymes, denied etoh x 2-3 years on that admission.  Left AMA.    Now back w gen weakness, unstable gait, no falls, very weak, no energy.  Loose stools for months. Incontinent of stool for a week or so per the niece who provides mostof hx today.    Lives alone, no children, never married.  When asked about ETOH pt denied recent use but niece says she is drinking and has been for many years, all types of alcohol.  Patient then admitted to etoh abuse history.    Old chart: 2013 - chron panc, abd pain , CBD stricture > ERCP w stent placement 2013 - STEM w PCI to mid LAD, DM2, HL, tobacco, hx etoh 2016 - [place], medical Rx Aug 2017 - acute/ chron abd pain due to pancreatitis vs UTI, improved w /o specific Rx. CT abd / US done. Left AmA.  UCx +mult morphtypes. Microcytic anemia. Soft BP's.   ROS  denies CP  no joint pain   no HA  no blurry vision  no rash  no nausea/ vomiting  no dysuria  no difficulty voiding  no change in urine color    Past Medical History  Past Medical History:  Diagnosis Date  . Alcohol abuse    Abstinent since 2009  . Chronic pancreatitis ([place])   . Colon adenoma 03/2012   Diminutive cecal  . Common bile duct stricture   . Coronary artery disease    DES x 2 to LAD 2007, DES to LAD 07/2012  . Depression   . Essential hypertension   . Fatty liver   . GERD (gastroesophageal reflux disease)   . GERD (gastroesophageal reflux disease)   . Hemorrhoids    . Hiatal hernia   . History of pneumonia 11/2011  . Hyperlipidemia   . PAD (peripheral artery disease) (HCC)    Occluded right external iliac artery   . Psoriasis   . ST elevation myocardial infarction (STEMI) of anterior wall ([place]) 07/2012  . Type 2 diabetes mellitus [facility])    Past Surgical History  Past Surgical History:  Procedure Laterality Date  . ABDOMINAL HYSTERECTOMY  1994  . CHOLECYSTECTOMY  1976  . COLONOSCOPY     multiple  . ERCP  07/25/2012   Procedure: ENDOSCOPIC RETROGRADE CHOLANGIOPANCREATOGRAPHY (ERCP);  Surgeon: Gatha Mayer, MD;  Location: [facility];  Service: Endoscopy;  Laterality: N/A;  with stent  . ESOPHAGOGASTRODUODENOSCOPY  10/25/2012   Procedure: ESOPHAGOGASTRODUODENOSCOPY (EGD);  Surgeon: Gatha Mayer, MD;  Location: [facility];  Service: Endoscopy;  Laterality: N/A;  EGD with stent removal using ERCP scope  . EUS  01/05/2012   Procedure: UPPER ENDOSCOPIC ULTRASOUND (EUS) LINEAR;  Surgeon: Milus Banister, MD;  Location: [facility] ENDOSCOPY;  Service: Endoscopy;  Laterality: N/A;  radial linear  . HEMORRHOID SURGERY  09/17/2013   THD hem ligation/pexy  . LEFT HEART CATH N/A 08/10/2012   Procedure: LEFT HEART CATH;  Surgeon:  Peter M Martinique, MD;  Location: [facility] CATH LAB;  Service: Cardiovascular;  Laterality: N/A;  . PERCUTANEOUS CORONARY STENT INTERVENTION (PCI-S)  08/10/2012   Procedure: PERCUTANEOUS CORONARY STENT INTERVENTION (PCI-S);  Surgeon: Peter M Martinique, MD;  Location: [facility] CATH LAB;  Service: Cardiovascular;;  . STRESS MYOCARDIAL PERFUSION  04/20/10   NORMAL PATTERN OF PERFUSION IN ALL REGIONS. LV SIZE IS NORMAL. EF= 61%. NO SIGN ISCHEMIA DEMONSTRATED.  Marland Kitchen TRANSTHORACIC ECHOCARDIOGRAM  04/20/10   PROXIMAL SEPTAL THICKENING IS NOTED. LV SYSTOLIC FUNCTION IS NORMAL. EF= >55%. LEFT ATRIAL SIZE IS NORMAL. RIGHT VENTRICULAR SYSTOLIC PRESSURE IS NORMAL. AV APPEARS TO BE MILDLY SCLEROTIC. NO AS.   Family History  Family History  Problem Relation Age of Onset  .  Colon cancer Mother     Colon resection  . Diabetes Mother   . Cirrhosis Father   . Coronary artery disease Brother   . Renal Disease Brother   . Malignant hyperthermia Neg Hx    Social History  reports that she has quit smoking. Her smoking use included Cigarettes. She has a 7.50 pack-year smoking history. She has never used smokeless tobacco. She reports that she does not drink alcohol or use drugs. Allergies  Allergies  Allergen Reactions  . Diclofenac Sodium Other (See Comments)    REACTION: Chronic renal insufficiency. Patient can use the gel   . Other Other (See Comments)    Blackeyed peas and Pinto Beans...break out in hives   Home medications Prior to Admission medications   Medication Sig Start Date End Date Taking? Authorizing Provider  acetaminophen (TYLENOL 8 HOUR) 650 MG CR tablet Take 1 tablet (650 mg total) by mouth every 8 (eight) hours as needed for pain. 02/05/16  Yes Varney Biles, MD  aspirin EC 81 MG EC tablet Take 1 tablet (81 mg total) by mouth daily. 08/13/12  Yes Isaiah Serge, NP  atorvastatin (LIPITOR) 40 MG tablet TAKE ONE (1) TABLET EACH DAY AT 6PM Patient taking differently: Take 40 mg by mouth each day 09/16/13  Yes Lorretta Harp, MD  clonazePAM (KLONOPIN) 1 MG tablet Take 1 tablet (1 mg total) by mouth at bedtime. 09/02/15  Yes Gatha Mayer, MD  clopidogrel (PLAVIX) 75 MG tablet Take 75 mg by mouth daily.     Yes Historical Provider, MD  diclofenac sodium (VOLTAREN) 1 % GEL Apply 4 g topically 4 (four) times daily. Patient taking differently: Apply 4 g topically 2 (two) times daily as needed (for pain).  09/02/15  Yes Gatha Mayer, MD  mirtazapine (REMERON) 15 MG tablet Take 1 tablet (15 mg total) by mouth at bedtime. 07/13/12  Yes Gatha Mayer, MD  nitroGLYCERIN (NITROSTAT) 0.4 MG SL tablet Place 1 tablet (0.4 mg total) under the tongue every 5 (five) minutes x 3 doses as needed for chest pain. 09/02/15  Yes Lorretta Harp, MD  omeprazole  (PRILOSEC) 40 MG capsule TAKE ONE CAPSULE BY MOUTH DAILY Patient taking differently: TAKE 40 MG BY MOUTH DAILY 01/21/16  Yes Gatha Mayer, MD  Pancrelipase, Lip-Prot-Amyl, (CREON) 24000 UNITS CPEP TAKE 3 CAPSULES 3 TIMES A DAY WITH MEALS AND TAKE ONE CAPSULE WITH SNACKS Patient taking differently: Take 24,000-72,000 Units by mouth See admin instructions. Take 72000 units by mouth 3 times daily with meals and take 24000 units by mouth with snack. 09/02/15  Yes Gatha Mayer, MD  polyethylene glycol [facility] / GLYCOLAX) packet Take 17 g by mouth daily as needed for moderate constipation.   Yes  Historical Provider, MD  QUEtiapine (SEROQUEL) 50 MG tablet Take 50 mg by mouth at bedtime.   Yes Historical Provider, MD  cephALEXin (KEFLEX) 500 MG capsule Take 1 capsule (500 mg total) by mouth 2 (two) times daily. Patient not taking: Reported on 06/08/2016 05/16/16   Dorie Rank, MD  HYDROcodone-acetaminophen (NORCO/VICODIN) 5-325 MG tablet Take 1 tablet by mouth every 6 (six) hours as needed. Patient not taking: Reported on 06/08/2016 02/05/16   Varney Biles, MD  methocarbamol (ROBAXIN) 500 MG tablet Take 1 tablet (500 mg total) by mouth 2 (two) times daily. Patient not taking: Reported on 06/08/2016 02/05/16   Varney Biles, MD  nicotine (NICODERM CQ) 14 mg/24hr patch Place 1 patch onto the skin daily. Patient not taking: Reported on 02/05/2016 04/03/13   Lorretta Harp, MD  oxyCODONE (OXY IR/ROXICODONE) 5 MG immediate release tablet Take 1-2 tablets (5-10 mg total) by mouth every 6 (six) hours as needed for moderate pain, severe pain or breakthrough pain. Patient not taking: Reported on 06/08/2016 09/02/15   Gatha Mayer, MD   Liver Function Tests  Recent Labs Lab 06/08/16 0958 06/09/16 0222 06/14/16 1428  AST 63* 49* 96*  ALT 23 17 31   ALKPHOS 168* 131* 180*  BILITOT 4.0* 2.7* 4.1*  PROT 6.4* 5.2* 7.1  ALBUMIN 1.9* 1.5* 2.2*    Recent Labs Lab 06/08/16 0958 06/14/16 1428  LIPASE 11 11    CBC  Recent Labs Lab 06/09/16 0222 06/09/16 0952 06/14/16 1428  WBC 5.0 8.9 6.4  HGB 6.9* 8.2*  8.3* 8.4*  HCT 19.6* 23.9*  25.0* 23.6*  MCV 67.8* 68.3* 65.6*  PLT 163 231 0000000   Basic Metabolic Panel  Recent Labs Lab 06/08/16 0958 06/09/16 0222 06/14/16 1428  NA 132* 136 137  K 4.0 3.7 4.7  CL 105 109 112*  CO2 17* 18* 18*  GLUCOSE 91 74 74  BUN 6 <5* <5*  CREATININE 1.27* 1.01* 0.97  CALCIUM 7.5* 7.0* 7.6*     Vitals:   06/14/16 1644 06/14/16 1732 06/14/16 1957 06/14/16 1958  BP: 109/85 92/75 97/74    Pulse: 99 87 82   Resp:  18 17   Temp:  97.8 F (36.6 C) 97.9 F (36.6 C)   TempSrc:  Oral Oral   SpO2: 98% 96% 100% 100%  Weight:      Height:       Exam: Gen frail, muscle wasting temporal region No rash, cyanosis or gangrene Sclera anicteric, throat clear and pale No jvd or bruits Chest clear bilat RRR no MRG Abd nontender, liver quite enlarged down 6 cm below RCM, tender, firm edge, and reaches across midline to left mid-abdomen, spleen not palpated, no gross ascites GU defer MS no joint effusions or deformity Ext 1-2+ bilat pretib edema / no wounds or ulcers Neuro is alert, Ox 3 , gen weakness, gait not tested   Na 137 K 4.7 BUN <5  Cr 0.97  Ca 7.6  Alb 2.2 gluc 74   Alkphos 180 ^  AST 96 ^  ALT 31 Tbili 4.1^^ LA 2.63 ^ WBC 6k  Hb 8.4  plt 181  MCV 65 (low) UA > clear, rare bact, +nit/ neg LE, 0-5 rbc/ wbc  CXR (independ reviewed) > no active disease  CT abd 06/08/16 >  1. Profound fatty change of the liver. 2. 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 3. Probable edema of the wall of the colon consistent with colitis. 4. Small amount of ascites. 5. Small  left pleural effusion layering dependently. 6. Periumbilical hernia containing fat.  7. Right upper quadrant anterior abdominal wall hernia containing only fat.  Abd [place] 06/08/16 >  Gallbladder: Surgically absent. Common bile duct: Diameter: 10 mm. Liver:  Coarsening of the echotexture suggests steatosis.  IMPRESSION: 1. Status postcholecystectomy. 2. Mild distention of the extrahepatic common duct for patient age. This may be related to prior cholecystectomy. Correlation with liver function test may prove helpful.   Assessment: 1.  Gen weakness - hx chronic ongoing ETOH abuse main issue along with liver disease (^bili/low alb), hepatomegaly c/w cirrhosis and/or etoh hepatitis.  Patient is very ill w low albumin and early jaundice. She is interested in giving up ETOH.  Advised pall care evaluation and niece is supportive and pt agreeable as well. Check INR.   2.  Hx chron pancreatitis - on enzymes, also due to #3 3.  Chron etoh abuse - ongoing, interested in stopping.  Get SW input.  4.  Hx MI / cor stent - on asa/ plavix 5.  LE edema - not main issue, secondary and will not diurese yet 6.  Debility - get PT consult to help w disposition. Pt lives alone.   7.  Anemia - hx microcytic anemia on po Fe; b12 normal, check folate  Plan - as above     Dickey D [facility] Pager [phone]  Cell [phone]  If 7PM-7AM, please contact night-coverage [email or web address] Password [facility] 06/14/2016, 9:21 PM

## 2016-06-14 NOTE — Progress Notes (Signed)
Pt arrived to floor, alert and oriented x4. Right arm SL. Will page admitting doctor and awaiting orders.

## 2016-06-15 ENCOUNTER — Encounter (HOSPITAL_COMMUNITY): Payer: Self-pay | Admitting: Physician Assistant

## 2016-06-15 ENCOUNTER — Inpatient Hospital Stay (HOSPITAL_COMMUNITY): Payer: Medicare Other

## 2016-06-15 DIAGNOSIS — R600 Localized edema: Secondary | ICD-10-CM

## 2016-06-15 LAB — COMPREHENSIVE METABOLIC PANEL
ALK PHOS: 151 U/L — AB (ref 38–126)
ALT: 27 U/L (ref 14–54)
AST: 81 U/L — ABNORMAL HIGH (ref 15–41)
Albumin: 1.7 g/dL — ABNORMAL LOW (ref 3.5–5.0)
Anion gap: 6 (ref 5–15)
BUN: 5 mg/dL — ABNORMAL LOW (ref 6–20)
CALCIUM: 7.3 mg/dL — AB (ref 8.9–10.3)
CO2: 20 mmol/L — AB (ref 22–32)
CREATININE: 0.73 mg/dL (ref 0.44–1.00)
Chloride: 113 mmol/L — ABNORMAL HIGH (ref 101–111)
Glucose, Bld: 70 mg/dL (ref 65–99)
Potassium: 4.3 mmol/L (ref 3.5–5.1)
Sodium: 139 mmol/L (ref 135–145)
Total Bilirubin: 3.7 mg/dL — ABNORMAL HIGH (ref 0.3–1.2)
Total Protein: 5.7 g/dL — ABNORMAL LOW (ref 6.5–8.1)

## 2016-06-15 LAB — CBC
HCT: 19.1 % — ABNORMAL LOW (ref 36.0–46.0)
Hemoglobin: 6.7 g/dL — CL (ref 12.0–15.0)
MCH: 23.1 pg — AB (ref 26.0–34.0)
MCHC: 35.1 g/dL (ref 30.0–36.0)
MCV: 65.9 fL — ABNORMAL LOW (ref 78.0–100.0)
PLATELETS: 137 10*3/uL — AB (ref 150–400)
RBC: 2.9 MIL/uL — AB (ref 3.87–5.11)
RDW: 17.2 % — ABNORMAL HIGH (ref 11.5–15.5)
WBC: 3.8 10*3/uL — AB (ref 4.0–10.5)

## 2016-06-15 LAB — ABO/RH: ABO/RH(D): A POS

## 2016-06-15 LAB — ECHOCARDIOGRAM COMPLETE
Height: 62 in
Weight: 2208.13 oz

## 2016-06-15 LAB — IRON AND TIBC: Iron: 48 ug/dL (ref 28–170)

## 2016-06-15 LAB — PREPARE RBC (CROSSMATCH)

## 2016-06-15 LAB — PROTIME-INR
INR: 2.42
Prothrombin Time: 26.7 seconds — ABNORMAL HIGH (ref 11.4–15.2)

## 2016-06-15 LAB — FERRITIN: Ferritin: 486 ng/mL — ABNORMAL HIGH (ref 11–307)

## 2016-06-15 LAB — FOLATE: FOLATE: 11.8 ng/mL (ref >5.9)

## 2016-06-15 MED ORDER — FUROSEMIDE 10 MG/ML IJ SOLN
40.0000 mg | Freq: Every day | INTRAMUSCULAR | Status: DC
  Administered 2016-06-15: 40 mg via INTRAVENOUS
  Filled 2016-06-15: qty 4

## 2016-06-15 MED ORDER — SODIUM CHLORIDE 0.9 % IV SOLN
Freq: Once | INTRAVENOUS | Status: AC
  Administered 2016-06-15: 09:00:00 via INTRAVENOUS

## 2016-06-15 NOTE — Progress Notes (Addendum)
CRITICAL VALUE ALERT  Critical value received:  HgB 6.8  Date of notification:  06/15/16  Time of notification:  0630  Critical value read back:yes  Nurse who received alert:  Joycelyn Schmid   MD notified (1st page):  NP Baltazar Najjar notified by Joycelyn Schmid  Time of first page:  0630  MD notified (2nd page): N/A  Time of second page:N/A  Responding MD:  Pending   Time MD responded:  pending

## 2016-06-15 NOTE — Progress Notes (Signed)
PROGRESS NOTE  [name] Gindlesperger [id number] DOB: December 11, 1945 DOA: 06/14/2016 PCP: Philis Fendt, MD   LOS: 1 day   Brief Narrative: [name] Foley is a 70 y.o. female with medical history significant of alcohol abuse, chronic pancreatitis due to #1, history of CBD stricture status post stenting by Dr. Carlean Purl with gastroenterology in the past, depression, hypertension, coronary artery disease status post NSTEMI with stenting in 2007 at 2013, admitted last night with generalized weakness and bilateral lower extremity swelling.  Assessment & Plan: Active Problems:   PANCREATITIS, CHRONIC    Leg swelling   Alcohol abuse   Alcoholic liver disease (HCC)   Hepatomegaly   Hyperbilirubinemia   History of MI (myocardial infarction)   History of coronary artery stent placement   Debility   Generalized weakness  - patient admits to ongoing ETOH use, most recent drink 2 days ago, likely contributing  - PT consult  LE edema / anasarca - in the setting of probable liver disease, low albumin - Lasix IV today, monitor response/renal function - check 2D echo to eval LV  Alcoholic hepatitis - mild, elevated bilirubin - LFTs improving  Chronic pancreatitis - continue Creon  HLD - continue statin  CAD with history of NSTEMI s/p stenting - no chest pain - continue Aspirin and Plavix  Severe malnutrition - nutrition consult  Anemia - acute on chronic, no blood in stools, no melena reported - drop today, looks dilutional as all lines dropped - check FOBT, if positive will need GI consult.  Type 2 diabetes mellitus - Diet controlled, most recent hemoglobin A1c was 6.0 in 2013 - CBG well-controlled on the BMP  Hypertension - soft BP in the setting of liver disease, monitor closely on Lasix.    DVT prophylaxis: SCD Code Status: Full Family Communication: no family bedside Disposition Plan: TBD, home vs SNF when ready  Consultants:   None   Procedures:   2D  echo: pending  Antimicrobials:  None    Subjective: - feeling weak, no chest pain, no shortness of breath   Objective: Vitals:   06/14/16 1958 06/14/16 2100 06/15/16 0432 06/15/16 0446  BP:  107/71 (!) 88/59 94/62  Pulse:  88 95   Resp:  18 16   Temp:  98.4 F (36.9 C) 98.8 F (37.1 C)   TempSrc:  Oral Oral   SpO2: 100% 98% 97%   Weight:   62.6 kg (138 lb 0.1 oz)   Height:        Intake/Output Summary (Last 24 hours) at 06/15/16 1204 Last data filed at 06/14/16 2200  Gross per 24 hour  Intake              100 ml  Output                0 ml  Net              100 ml   Filed Weights   06/14/16 1404 06/15/16 0432  Weight: 61.7 kg (136 lb) 62.6 kg (138 lb 0.1 oz)    Examination: Constitutional: NAD Vitals:   06/14/16 1958 06/14/16 2100 06/15/16 0432 06/15/16 0446  BP:  107/71 (!) 88/59 94/62  Pulse:  88 95   Resp:  18 16   Temp:  98.4 F (36.9 C) 98.8 F (37.1 C)   TempSrc:  Oral Oral   SpO2: 100% 98% 97%   Weight:   62.6 kg (138 lb 0.1 oz)   Height:  Eyes: PERRL, + icterus ENMT: Mucous membranes are moist. Respiratory: clear to auscultation bilaterally, no wheezing, no crackles. Normal respiratory effort. No accessory muscle use.  Cardiovascular: Regular rate and rhythm, no murmurs / rubs / gallops. 2+ pitting LE edema Abdomen: mild epigastric tenderness Musculoskeletal: no clubbing / cyanosis. Skin: no rashes, lesions, ulcers. No induration Neurologic: non focal   Data Reviewed: I have personally reviewed following labs and imaging studies  CBC:  Recent Labs Lab 06/09/16 0222 06/09/16 0952 06/14/16 1428 06/15/16 0558  WBC 5.0 8.9 6.4 3.8*  HGB 6.9* 8.2*  8.3* 8.4* 6.7*  HCT 19.6* 23.9*  25.0* 23.6* 19.1*  MCV 67.8* 68.3* 65.6* 65.9*  PLT 163 231 181 0000000*   Basic Metabolic Panel:  Recent Labs Lab 06/09/16 0222 06/14/16 1428 06/15/16 0558  NA 136 137 139  K 3.7 4.7 4.3  CL 109 112* 113*  CO2 18* 18* 20*  GLUCOSE 74 74 70  BUN  <5* <5* 5*  CREATININE 1.01* 0.97 0.73  CALCIUM 7.0* 7.6* 7.3*   GFR: Estimated Creatinine Clearance: 58.5 mL/min (by C-G formula based on SCr of 0.8 mg/dL). Liver Function Tests:  Recent Labs Lab 06/09/16 0222 06/14/16 1428 06/15/16 0558  AST 49* 96* 81*  ALT 17 31 27   ALKPHOS 131* 180* 151*  BILITOT 2.7* 4.1* 3.7*  PROT 5.2* 7.1 5.7*  ALBUMIN 1.5* 2.2* 1.7*    Recent Labs Lab 06/14/16 1428  LIPASE 11   No results for input(s): AMMONIA in the last 168 hours. Coagulation Profile:  Recent Labs Lab 06/15/16 0558  INR 2.42   Cardiac Enzymes:  Recent Labs Lab 06/08/16 1642  TROPONINI <0.03   BNP (last 3 results) No results for input(s): PROBNP in the last 8760 hours. HbA1C: No results for input(s): HGBA1C in the last 72 hours. CBG:  Recent Labs Lab 06/08/16 1709  GLUCAP 84   Lipid Profile: No results for input(s): CHOL, HDL, LDLCALC, TRIG, CHOLHDL, LDLDIRECT in the last 72 hours. Thyroid Function Tests: No results for input(s): TSH, T4TOTAL, FREET4, T3FREE, THYROIDAB in the last 72 hours. Anemia Panel:  Recent Labs  06/15/16 0558  FOLATE 11.8  FERRITIN 486*  TIBC NOT CALCULATED  IRON 48   Urine analysis:    Component Value Date/Time   COLORURINE ORANGE (A) 06/14/2016 1724   APPEARANCEUR CLEAR 06/14/2016 1724   LABSPEC 1.013 06/14/2016 1724   PHURINE 5.5 06/14/2016 1724   GLUCOSEU NEGATIVE 06/14/2016 1724   HGBUR NEGATIVE 06/14/2016 1724   BILIRUBINUR MODERATE (A) 06/14/2016 1724   KETONESUR NEGATIVE 06/14/2016 1724   PROTEINUR NEGATIVE 06/14/2016 1724   UROBILINOGEN 1.0 07/03/2012 2021   NITRITE POSITIVE (A) 06/14/2016 1724   LEUKOCYTESUR NEGATIVE 06/14/2016 1724   Sepsis Labs: Invalid input(s): PROCALCITONIN, LACTICIDVEN  Recent Results (from the past 240 hour(s))  Urine culture     Status: Abnormal   Collection Time: 06/08/16 11:44 AM  Result Value Ref Range Status   Specimen Description URINE, RANDOM  Final   Special Requests  NONE  Final   Culture MULTIPLE SPECIES PRESENT, SUGGEST RECOLLECTION (A)  Final   Report Status 06/09/2016 FINAL  Final      Radiology Studies: Dg Chest 2 View  Result Date: 06/14/2016 CLINICAL DATA:  Bilateral ankle edema, cough EXAM: CHEST  2 VIEW COMPARISON:  05/16/2016 FINDINGS: There is no focal parenchymal opacity. There is no pleural effusion or pneumothorax. The heart and mediastinal contours are unremarkable. The osseous structures are unremarkable. IMPRESSION: No active cardiopulmonary disease. Electronically Signed  By: Kathreen Devoid   On: 06/14/2016 17:03     Scheduled Meds: . aspirin EC  81 mg Oral Daily  . atorvastatin  40 mg Oral q1800  . clopidogrel  75 mg Oral Daily  . lipase/protease/amylase  72,000 Units Oral TID WC  . mirtazapine  15 mg Oral QHS  . nicotine  14 mg Transdermal QHS  . pantoprazole  40 mg Oral Daily  . QUEtiapine  50 mg Oral QHS   Continuous Infusions:    Marzetta Board, MD, PhD [facility] Pager [phone]  If 7PM-7AM, please contact night-coverage [email or web address] Password [facility] 06/15/2016, 12:04 PM

## 2016-06-15 NOTE — Progress Notes (Signed)
LCSWA attempted to provide patient with requested information for SA inpatient and outpatient facilities. Patient told LCSWA,  "Please come back another day, I do not want to talk about that right now." LCSWA agreed to return at another time.

## 2016-06-15 NOTE — Progress Notes (Signed)
PT Cancellation Note  Patient Details Name: [name] Foley MRN: [id number] DOB: 1946/10/23   Cancelled Treatment:    Reason Eval/Treat Not Completed: Medical issues which prohibited therapy (getting blood )   [name] 06/15/2016, 3:57 PM Tresa Endo PT [phone]

## 2016-06-15 NOTE — Progress Notes (Signed)
  Echocardiogram 2D Echocardiogram has been performed.  Jennette Dubin 06/15/2016, 5:26 PM

## 2016-06-15 NOTE — Progress Notes (Signed)
No charge note.  Discussed with Dr. Cruzita Lederer.  It was decided to cancel the consultation for now.  Please re-consult PMT if we can be of assistance in the future.  Imogene Burn, [place] [facility] Pager: [phone]

## 2016-06-16 LAB — CBC
HEMATOCRIT: 22.4 % — AB (ref 36.0–46.0)
Hemoglobin: 8.1 g/dL — ABNORMAL LOW (ref 12.0–15.0)
MCH: 25 pg — ABNORMAL LOW (ref 26.0–34.0)
MCHC: 36.2 g/dL — ABNORMAL HIGH (ref 30.0–36.0)
MCV: 69.1 fL — ABNORMAL LOW (ref 78.0–100.0)
PLATELETS: 100 10*3/uL — AB (ref 150–400)
RBC: 3.24 MIL/uL — AB (ref 3.87–5.11)
RDW: 19.4 % — AB (ref 11.5–15.5)
WBC: 3.7 10*3/uL — AB (ref 4.0–10.5)

## 2016-06-16 LAB — COMPREHENSIVE METABOLIC PANEL
ALBUMIN: 1.5 g/dL — AB (ref 3.5–5.0)
ALT: 26 U/L (ref 14–54)
AST: 78 U/L — AB (ref 15–41)
Alkaline Phosphatase: 133 U/L — ABNORMAL HIGH (ref 38–126)
Anion gap: 3 — ABNORMAL LOW (ref 5–15)
BUN: 6 mg/dL (ref 6–20)
CHLORIDE: 115 mmol/L — AB (ref 101–111)
CO2: 22 mmol/L (ref 22–32)
Calcium: 7 mg/dL — ABNORMAL LOW (ref 8.9–10.3)
Creatinine, Ser: 0.84 mg/dL (ref 0.44–1.00)
GFR calc Af Amer: >60 mL/min (ref >60)
GFR calc non Af Amer: >60 mL/min (ref >60)
GLUCOSE: 98 mg/dL (ref 65–99)
POTASSIUM: 3.9 mmol/L (ref 3.5–5.1)
Sodium: 140 mmol/L (ref 135–145)
Total Bilirubin: 3.9 mg/dL — ABNORMAL HIGH (ref 0.3–1.2)
Total Protein: 5.2 g/dL — ABNORMAL LOW (ref 6.5–8.1)

## 2016-06-16 LAB — PHOSPHORUS: Phosphorus: 2.1 mg/dL — ABNORMAL LOW (ref 2.5–4.6)

## 2016-06-16 LAB — TYPE AND SCREEN
ABO/RH(D): A POS
ANTIBODY SCREEN: NEGATIVE
UNIT DIVISION: 0

## 2016-06-16 LAB — MAGNESIUM: Magnesium: 1.1 mg/dL — ABNORMAL LOW (ref 1.7–2.4)

## 2016-06-16 MED ORDER — POTASSIUM & SODIUM PHOSPHATES 280-160-250 MG PO PACK
1.0000 | PACK | Freq: Three times a day (TID) | ORAL | Status: AC
  Administered 2016-06-16 (×3): 1 via ORAL
  Filled 2016-06-16 (×3): qty 1

## 2016-06-16 MED ORDER — ENSURE ENLIVE PO LIQD
237.0000 mL | Freq: Three times a day (TID) | ORAL | Status: DC
  Administered 2016-06-16 – 2016-06-17 (×3): 237 mL via ORAL

## 2016-06-16 MED ORDER — TRAMADOL HCL 50 MG PO TABS
25.0000 mg | ORAL_TABLET | Freq: Four times a day (QID) | ORAL | Status: DC | PRN
  Administered 2016-06-16 (×2): 25 mg via ORAL
  Filled 2016-06-16 (×2): qty 1

## 2016-06-16 MED ORDER — MAGNESIUM SULFATE 4 GM/100ML IV SOLN
4.0000 g | Freq: Once | INTRAVENOUS | Status: AC
  Administered 2016-06-16: 4 g via INTRAVENOUS
  Filled 2016-06-16: qty 100

## 2016-06-16 MED ORDER — VITAMINS A & D EX OINT
TOPICAL_OINTMENT | CUTANEOUS | Status: AC
  Administered 2016-06-16: 5
  Filled 2016-06-16: qty 5

## 2016-06-16 NOTE — Progress Notes (Signed)
LCSWA met with patient at bedside for follow up with resources. Patient pleasantly declines resources for outpatient/ inpatient SA, and SNF for rehab at this time.  No other needs identified. Mitchell signing off at this time.   Kathrin Greathouse, Latanya Presser, MSW Clinical Social Worker 5E and Psychiatric Service Line [phone] 06/16/2016  3:18 PM

## 2016-06-16 NOTE — Evaluation (Signed)
Physical Therapy Evaluation Patient Details Name: [name] Foley MRN: [id number] DOB: [birth year] Today's Date: 06/16/2016   History of Present Illness  [name] Foley is a 70 y.o. female with hx of MI/ CAD w stent, tobacco, chron pancreatitis , hx etoh abuse and HL.  Pt admitted here for 3 days 8/16- 8.18/17 w acute on chron abd pain.  CT abd and [place] unrevealing per DC summary, poss d/t UTI vs pancreatitis. Denied etoh x 2-3 years on that admission.  Left AMA.  Returned 8/22 with c/o weakness, decreased apetite, anemia.  Clinical Impression  The patient ambulated  With and without RW, gait is steady , slow. Patient has a flat affect. She declines going to rehab facility and relates a neighbor can assist. No family present to verify. HR  124 while ambulatring. RN aware. BP 115/80.  Pt admitted with above diagnosis. Pt currently with functional limitations due to the deficits listed below (see PT Problem List).  Pt will benefit from skilled PT to increase their independence and safety with mobility to allow discharge to the venue listed below.     Follow Up Recommendations Home health PT;Supervision/Assistance - 24 hour (patient stated she would not go to REHAB/snf)could benefit from SNF if agrees.    Equipment Recommendations  None recommended by PT    Recommendations for Other Services       Precautions / Restrictions Precautions Precautions: Fall Precaution Comments: monitor HR Restrictions Weight Bearing Restrictions: No      Mobility  Bed Mobility Overal bed mobility: Independent                Transfers Overall transfer level: Needs assistance Equipment used: Rolling walker (2 wheeled) Transfers: Sit to/from Stand Sit to Stand: Supervision         General transfer comment: stands and is steady  Ambulation/Gait Ambulation/Gait assistance: Min guard Ambulation Distance (Feet): 125 Feet Assistive device: Rolling walker (2 wheeled) Gait Pattern/deviations:  Step-through pattern Gait velocity: slow   General Gait Details: then ambulated x 75' with 1 handhold. HRb 124, sats 100 while ambulating  Stairs            Wheelchair Mobility    Modified Rankin (Stroke Patients Only)       Balance Overall balance assessment: History of Falls;Needs assistance Sitting-balance support: Feet supported;No upper extremity supported Sitting balance-Leahy Scale: Good     Standing balance support: During functional activity;No upper extremity supported Standing balance-Leahy Scale: Fair                               Pertinent Vitals/Pain Pain Assessment: Faces Faces Pain Scale: Hurts little more Pain Location: both legs while ambulating Pain Descriptors / Indicators: Discomfort Pain Intervention(s): Monitored during session;Limited activity within patient's tolerance    Home Living Family/patient expects to be discharged to:: Private residence Living Arrangements: Alone Available Help at Discharge: Neighbor Type of Home: Apartment Home Access: Stairs to enter Entrance Stairs-Rails: Psychiatric nurse of Steps: 3 Home Layout: One level Home Equipment: None      Prior Function Level of Independence: Independent               Hand Dominance        Extremity/Trunk Assessment   Upper Extremity Assessment: Generalized weakness           Lower Extremity Assessment: Generalized weakness      Cervical / Trunk Assessment: Normal  Communication  Communication: No difficulties  Cognition Arousal/Alertness: Awake/alert Behavior During Therapy: WFL for tasks assessed/performed;Flat affect Overall Cognitive Status: Within Functional Limits for tasks assessed                      General Comments      Exercises        Assessment/Plan    PT Assessment Patient needs continued PT services  PT Diagnosis Difficulty walking;Generalized weakness;Acute pain   PT Problem List  Decreased strength;Decreased activity tolerance;Decreased balance;Decreased mobility;Decreased safety awareness;Decreased knowledge of use of DME;Pain  PT Treatment Interventions Gait training;Functional mobility training;Therapeutic activities;Patient/family education   PT Goals (Current goals can be found in the Care Plan section) Acute Rehab PT Goals Patient Stated Goal: to go home PT Goal Formulation: With patient Time For Goal Achievement: 06/30/16 Potential to Achieve Goals: Good    Frequency Min 3X/week   Barriers to discharge Decreased caregiver support patient states a neighbor can stay- experienced as CNA- no family present to verify    Co-evaluation               End of Session Equipment Utilized During Treatment: Gait belt Activity Tolerance: Patient tolerated treatment well Patient left: in chair;with call bell/phone within reach;with chair alarm set Nurse Communication: Mobility status         Time: [id number] PT Time Calculation (min) (ACUTE ONLY): 23 min   Charges:   PT Evaluation $PT Eval Low Complexity: 1 Procedure PT Treatments $Gait Training: 8-22 mins   PT G Codes:        [name] 06/16/2016, 9:03 AM Tresa Endo PT [phone]

## 2016-06-16 NOTE — Progress Notes (Signed)
PROGRESS NOTE  [name] Foley [id number] DOB: Mar 29, 1946 DOA: 06/14/2016 PCP: Philis Fendt, MD   LOS: 2 days   Brief Narrative: [name] Foley is a 70 y.o. female with medical history significant of alcohol abuse, chronic pancreatitis due to #1, history of CBD stricture status post stenting by Dr. Carlean Purl with gastroenterology in the past, depression, hypertension, coronary artery disease status post NSTEMI with stenting in 2007 at 2013, admitted with generalized weakness and bilateral lower extremity swelling.  Assessment & Plan: Active Problems:   PANCREATITIS, CHRONIC    Leg swelling   Alcohol abuse   Alcoholic liver disease (HCC)   Hepatomegaly   Hyperbilirubinemia   History of MI (myocardial infarction)   History of coronary artery stent placement   Debility   Generalized weakness  - patient admits to ongoing ETOH use, most recent drink 3 days ago, likely contributing  - PT consult rec HHPT  LE edema / anasarca - in the setting of probable liver disease, low albumin - received IV Lasix 8/23, LE edema improved significantly - renal function remained stable  Hypomagnesemia / hypophosphatemia - due to poor po intake - replete  Alcoholic hepatitis - mild, elevated bilirubin - LFTs stable  Chronic pancreatitis - continue Creon  HLD - continue statin  CAD with history of NSTEMI s/p stenting - no chest pain - continue Aspirin and Plavix  Severe malnutrition - nutrition consult  Anemia - acute on chronic, no blood in stools, no melena reported - stable today - check FOBT, if positive will need GI consult. - likely due to probable liver disease  Thrombocytopenia - due to ETOH / liver disease  Type 2 diabetes mellitus - Diet controlled, most recent hemoglobin A1c was 6.0 in 2013 - CBG well-controlled on the BMP  Hypertension - soft BP in the setting of liver disease, monitor closely on Lasix.    DVT prophylaxis: SCD Code Status:  Full Family Communication: no family bedside Disposition Plan: TBD, home vs SNF 1 day  Consultants:   None   Procedures:   2D echo Study Conclusions - Left ventricle: The cavity size was normal. Wall thickness was increased in a pattern of mild LVH. Systolic function was normal. The estimated ejection fraction was in the range of 50% to 55%. Wall motion was normal; there were no regional wall motion abnormalities. Doppler parameters are consistent with abnormal left ventricular relaxation (grade 1 diastolic dysfunction).  Antimicrobials:  None    Subjective: - feeling weak, no chest pain, no shortness of breath   Objective: Vitals:   06/15/16 1415 06/15/16 1650 06/15/16 2155 06/16/16 0601  BP: 92/66 99/69 (!) 86/55 107/72  Pulse: 92 87 81 88  Resp: 18 18 18 18   Temp: 98.7 F (37.1 C) 98.9 F (37.2 C) 98.3 F (36.8 C) 98.9 F (37.2 C)  TempSrc: Oral Oral Oral Oral  SpO2: 100% 100% 100% 98%  Weight:      Height:        Intake/Output Summary (Last 24 hours) at 06/16/16 1107 Last data filed at 06/15/16 1843  Gross per 24 hour  Intake              705 ml  Output              300 ml  Net              405 ml   Filed Weights   06/14/16 1404 06/15/16 0432  Weight: 61.7 kg (136 lb) 62.6 kg (  138 lb 0.1 oz)    Examination: Constitutional: NAD Vitals:   06/15/16 1415 06/15/16 1650 06/15/16 2155 06/16/16 0601  BP: 92/66 99/69 (!) 86/55 107/72  Pulse: 92 87 81 88  Resp: 18 18 18 18   Temp: 98.7 F (37.1 C) 98.9 F (37.2 C) 98.3 F (36.8 C) 98.9 F (37.2 C)  TempSrc: Oral Oral Oral Oral  SpO2: 100% 100% 100% 98%  Weight:      Height:       Eyes: PERRL, + icterus ENMT: Mucous membranes are moist. Respiratory: clear to auscultation bilaterally, no wheezing, no crackles. Normal respiratory effort. No accessory muscle use.  Cardiovascular: Regular rate and rhythm, no murmurs / rubs / gallops. 2+ pitting LE edema Abdomen: mild epigastric tenderness Musculoskeletal:  no clubbing / cyanosis. Skin: no rashes, lesions, ulcers. No induration Neurologic: non focal   Data Reviewed: I have personally reviewed following labs and imaging studies  CBC:  Recent Labs Lab 06/14/16 1428 06/15/16 0558 06/16/16 0554  WBC 6.4 3.8* 3.7*  HGB 8.4* 6.7* 8.1*  HCT 23.6* 19.1* 22.4*  MCV 65.6* 65.9* 69.1*  PLT 181 137* 123XX123*   Basic Metabolic Panel:  Recent Labs Lab 06/14/16 1428 06/15/16 0558 06/16/16 0554  NA 137 139 140  K 4.7 4.3 3.9  CL 112* 113* 115*  CO2 18* 20* 22  GLUCOSE 74 70 98  BUN <5* 5* 6  CREATININE 0.97 0.73 0.84  CALCIUM 7.6* 7.3* 7.0*  MG  --   --  1.1*  PHOS  --   --  2.1*   GFR: Estimated Creatinine Clearance: 55.8 mL/min (by C-G formula based on SCr of 0.84 mg/dL). Liver Function Tests:  Recent Labs Lab 06/14/16 1428 06/15/16 0558 06/16/16 0554  AST 96* 81* 78*  ALT 31 27 26   ALKPHOS 180* 151* 133*  BILITOT 4.1* 3.7* 3.9*  PROT 7.1 5.7* 5.2*  ALBUMIN 2.2* 1.7* 1.5*    Recent Labs Lab 06/14/16 1428  LIPASE 11   No results for input(s): AMMONIA in the last 168 hours. Coagulation Profile:  Recent Labs Lab 06/15/16 0558  INR 2.42   Cardiac Enzymes: No results for input(s): CKTOTAL, CKMB, CKMBINDEX, TROPONINI in the last 168 hours. BNP (last 3 results) No results for input(s): PROBNP in the last 8760 hours. HbA1C: No results for input(s): HGBA1C in the last 72 hours. CBG: No results for input(s): GLUCAP in the last 168 hours. Lipid Profile: No results for input(s): CHOL, HDL, LDLCALC, TRIG, CHOLHDL, LDLDIRECT in the last 72 hours. Thyroid Function Tests: No results for input(s): TSH, T4TOTAL, FREET4, T3FREE, THYROIDAB in the last 72 hours. Anemia Panel:  Recent Labs  06/15/16 0558  FOLATE 11.8  FERRITIN 486*  TIBC NOT CALCULATED  IRON 48   Urine analysis:    Component Value Date/Time   COLORURINE ORANGE (A) 06/14/2016 1724   APPEARANCEUR CLEAR 06/14/2016 1724   LABSPEC 1.013 06/14/2016 1724    PHURINE 5.5 06/14/2016 1724   GLUCOSEU NEGATIVE 06/14/2016 1724   HGBUR NEGATIVE 06/14/2016 1724   BILIRUBINUR MODERATE (A) 06/14/2016 1724   KETONESUR NEGATIVE 06/14/2016 1724   PROTEINUR NEGATIVE 06/14/2016 1724   UROBILINOGEN 1.0 07/03/2012 2021   NITRITE POSITIVE (A) 06/14/2016 1724   LEUKOCYTESUR NEGATIVE 06/14/2016 1724   Sepsis Labs: Invalid input(s): PROCALCITONIN, LACTICIDVEN  Recent Results (from the past 240 hour(s))  Urine culture     Status: Abnormal   Collection Time: 06/08/16 11:44 AM  Result Value Ref Range Status   Specimen Description URINE, RANDOM  Final   Special Requests NONE  Final   Culture MULTIPLE SPECIES PRESENT, SUGGEST RECOLLECTION (A)  Final   Report Status 06/09/2016 FINAL  Final      Radiology Studies: Dg Chest 2 View  Result Date: 06/14/2016 CLINICAL DATA:  Bilateral ankle edema, cough EXAM: CHEST  2 VIEW COMPARISON:  05/16/2016 FINDINGS: There is no focal parenchymal opacity. There is no pleural effusion or pneumothorax. The heart and mediastinal contours are unremarkable. The osseous structures are unremarkable. IMPRESSION: No active cardiopulmonary disease. Electronically Signed   By: Kathreen Devoid   On: 06/14/2016 17:03     Scheduled Meds: . aspirin EC  81 mg Oral Daily  . atorvastatin  40 mg Oral q1800  . clopidogrel  75 mg Oral Daily  . lipase/protease/amylase  72,000 Units Oral TID WC  . magnesium sulfate 1 - 4 g bolus IVPB  4 g Intravenous Once  . mirtazapine  15 mg Oral QHS  . nicotine  14 mg Transdermal QHS  . pantoprazole  40 mg Oral Daily  . potassium & sodium phosphates  1 packet Oral TID WC & HS  . QUEtiapine  50 mg Oral QHS   Continuous Infusions:    Marzetta Board, MD, PhD [facility] Pager [phone]  If 7PM-7AM, please contact night-coverage [email or web address] Password [facility] 06/16/2016, 11:07 AM

## 2016-06-16 NOTE — Progress Notes (Signed)
Initial Nutrition Assessment  DOCUMENTATION CODES:   Not applicable  INTERVENTION:  - Will order Ensure Enlive TID, each supplement provides 350 kcal and 20 grams of protein - Encourage PO intakes of meals and supplements.  NUTRITION DIAGNOSIS:   Inadequate oral intake related to acute illness, poor appetite as evidenced by per patient/family report.  GOAL:   Patient will meet greater than or equal to 90% of their needs  MONITOR:   PO intake, Supplement acceptance, Weight trends, Labs, Skin, I & O's  REASON FOR ASSESSMENT:   Consult Assessment of nutrition requirement/status  ASSESSMENT:   70 y.o. female with hx of MI/ CAD w stent, tobacco, chron pancreatitis , hx etoh abuse and HL.  Pt admitted here for 3 days 8/16- 8.18/17 w acute on chron abd pain, not sure etiology.  CT abd and [place] unrevealing per DC summary, poss d/t UTI (mult types on Cx) vs pancreatitis. Takes panc enzymes, denied etoh x 2-3 years on that admission.  Pt seen for consult. BMI indicates normal weight/border line overweight status. Pt states that she ate 75-100% of breakfast this AM which consisted of [place] toast and coffee. Pt denies abdominal pain or nausea with intakes. She states that PTA her appetite was so-so for unknown amount of time and that the 3 days PTA she had no appetite and did not consume anything PO.   Pt does not have any teeth and states dentures are at home. She wears dentures to eat and denies any chewing issues when wearing them. She denies swallowing difficulties. Pt states that PTA she was drinking Ensure, typically TID, and is interested in receiving it here in vanilla flavor.   Pt did not want physical assessment performed at this time; will attempt at follow-up if pt is agreeable. She states that PTA her weight fluctuated often and she is unsure of a UBW. She denies ever having abdominal distention/swelling (ascites). Per chart review, pt has gained 8 lbs over the past 1 month. She  had previously lost 16 lbs (11% body weight) from April 2017-July 2017 which is significant for time frame. Pt likely meets criteria for malnutrition but unable to confirm at this time without physical assessment.  Not meeting needs PTA. Will order Ensure TID and continue to monitor for additional needs. Pt may require diet education prior to d/c; please consult for the same should this be desired. Estimated nutrition needs based on hx of chronic pancreatitis.   Medications reviewed; 72000 units Creon TID, 69629 units Creon PRN, 4 g IV Mg sulfate x1 dose today, PRN Zofran, 40 mg oral Protonix/day, 1 packet Phos-Nak TID.  Labs reviewed; Cl: 115 mmol/L, Ca: 7 mg/dL, Phos: 2.1 mg/dL, Mg: 1.1 mg/dL, Alk Phos/AST elevated.    Diet Order:  Diet regular Room service appropriate? Yes; Fluid consistency: Thin  Skin:  Reviewed, no issues  Last BM:  8/22  Height:   Ht Readings from Last 1 Encounters:  06/14/16 '5\' 2"'$  (1.575 m)    Weight:   Wt Readings from Last 1 Encounters:  06/15/16 138 lb 0.1 oz (62.6 kg)    Ideal Body Weight:  50 kg  BMI:  Body mass index is 25.24 kg/m.  Estimated Nutritional Needs:   Kcal:  1600-1800  Protein:  80-90 grams  Fluid:  1.5 L/day  EDUCATION NEEDS:   No education needs identified at this time    Jarome Matin, MS, RD, LDN Inpatient Clinical Dietitian Pager # [phone] After hours/weekend pager # [phone]

## 2016-06-17 MED ORDER — THERA VITAL M PO TABS: 1.0000 | ORAL_TABLET | Freq: Every day | ORAL | 1 refills | Status: DC

## 2016-06-17 MED ORDER — FERROUS GLUCONATE 324 (38 FE) MG PO TABS: 324.0000 mg | ORAL_TABLET | Freq: Every day | ORAL | 0 refills | Status: DC

## 2016-06-17 MED ORDER — MAGNESIUM GLUCONATE 30 MG PO TABS: 30.0000 mg | ORAL_TABLET | Freq: Two times a day (BID) | ORAL | 0 refills | Status: DC

## 2016-06-17 MED ORDER — TRAMADOL HCL 50 MG PO TABS: 25.0000 mg | ORAL_TABLET | Freq: Four times a day (QID) | ORAL | 0 refills | Status: DC | PRN

## 2016-06-17 MED ORDER — ENSURE ENLIVE PO LIQD: 237.0000 mL | Freq: Three times a day (TID) | ORAL | 12 refills | Status: DC

## 2016-06-17 NOTE — Progress Notes (Signed)
Physical Therapy Treatment Patient Details Name: [name] Foley MRN: [id number] DOB: Aug 17, 1946 Today's Date: 06/17/2016    History of Present Illness [name] Foley is a 70 y.o. female with hx of MI/ CAD w stent, tobacco, chron pancreatitis , hx etoh abuse and HL.  Pt admitted here for 3 days 8/16- 8.18/17 w acute on chron abd pain.  CT abd and [place] unrevealing per DC summary, poss d/t UTI vs pancreatitis. Denied etoh x 2-3 years on that admission.  Left AMA.  Returned 8/22 with c/o weakness, decreased apetite, anemia.    PT Comments    Assisted with amb in hallway w/o any AD then performed BERG balance test.  Pt scored 44/56 indicating slight fall risk.  Greatest difficulty one leg stance and alternate step up.  Pt would benefit from using a walker outside of the home.    Follow Up Recommendations  Home health PT;Supervision/Assistance - 24 hour     Equipment Recommendations  None recommended by PT    Recommendations for Other Services       Precautions / Restrictions Precautions Precautions: Fall Precaution Comments: monitor HR Restrictions Weight Bearing Restrictions: No    Mobility  Bed Mobility               General bed mobility comments: Pt OOB in recliner  Transfers Overall transfer level: Needs assistance Equipment used: None;1 person hand held assist Transfers: Sit to/from Stand;Stand Pivot Transfers Sit to Stand: Supervision Stand pivot transfers: Supervision       General transfer comment: def use of hahnds to steady self  Ambulation/Gait Ambulation/Gait assistance: Supervision;Min guard Ambulation Distance (Feet): 55 Feet Assistive device: None Gait Pattern/deviations: Step-through pattern;Shuffle;Decreased stride length;Narrow base of support Gait velocity: decreased   General Gait Details: amb w/o AD as pt indicated.  Very slow, shuffled, short steps gait.     Stairs            Wheelchair Mobility    Modified Rankin (Stroke  Patients Only)       Balance                                    Cognition Arousal/Alertness: Awake/alert Behavior During Therapy: WFL for tasks assessed/performed;Flat affect Overall Cognitive Status: Within Functional Limits for tasks assessed                      Exercises      General Comments        Pertinent Vitals/Pain Pain Location: B LE Pain Descriptors / Indicators: Discomfort Pain Intervention(s): Monitored during session;Repositioned    Home Living                      Prior Function            PT Goals (current goals can now be found in the care plan section) Progress towards PT goals: Progressing toward goals    Frequency  Min 3X/week    PT Plan Current plan remains appropriate    Co-evaluation             End of Session Equipment Utilized During Treatment: Gait belt Activity Tolerance: Patient tolerated treatment well Patient left: in chair;with call bell/phone within reach;with chair alarm set     Time: [id number] PT Time Calculation (min) (ACUTE ONLY): 19 min  Charges:  $Gait Training: 8-22 mins  G Codes:      Rica Koyanagi  PTA [facility]  Acute  Rehab Pager      [phone]

## 2016-06-17 NOTE — Discharge Summary (Signed)
Physician Discharge Summary  [name] Parkin [id number] DOB: [birth year] DOA: 06/14/2016  PCP: Philis Fendt, MD  Admit date: 06/14/2016 Discharge date: 06/17/2016  Admitted From: home Disposition:  Home with niece  Recommendations for Outpatient Follow-up:  1. Follow up with PCP in 1-2 weeks 2. Repeat CBC in 1 week  Home Health: yes Equipment/Devices: no  Discharge Condition: stable CODE STATUS: Full Diet recommendation: regular  HPI: Per Dr. Jonnie Finner, HPI: [name] Foley is a 70 y.o. female with hx of MI/ CAD w stent, tobacco, chron pancreatitis , hx etoh abuse and HL.  Pt admitted here for 3 days 8/16- 8.18/17 w acute on chron abd pain, not sure etiology.  CT abd and [place] unrevealing per DC summary, poss d/t UTI (mult types on Cx) vs pancreatitis. Takes panc enzymes, denied etoh x 2-3 years on that admission.  Left AMA.    Now back w gen weakness, unstable gait, no falls, very weak, no energy.  Loose stools for months. Incontinent of stool for a week or so per the niece who provides mostof hx today.    Lives alone, no children, never married.  When asked about ETOH pt denied recent use but niece says she is drinking and has been for many years, all types of alcohol.  Patient then admitted to etoh abuse history.    Old chart: 2013 - chron panc, abd pain , CBD stricture > ERCP w stent placement 2013 - STEM w PCI to mid LAD, DM2, HL, tobacco, hx etoh 2016 - [place], medical Rx Aug 2017 - acute/ chron abd pain due to pancreatitis vs UTI, improved w /o specific Rx. CT abd / US done. Left AmA.  UCx +mult morphtypes. Microcytic anemia. Soft BP's.   Hospital Course: Discharge Diagnoses:  Active Problems:   PANCREATITIS, CHRONIC    Leg swelling   Alcohol abuse   Alcoholic liver disease (HCC)   Hepatomegaly   Hyperbilirubinemia   History of MI (myocardial infarction)   History of coronary artery stent placement   Debility   Generalized weakness - patient admits to  ongoing ETOH use, most recent drink 3 days PTA, likely contributing, PT consulted, initially discussed with patient regarding skilled nursing, however she refused, and went home to live with her niece with home health PT. LE edema / anasarca - in the setting of probable liver disease, low albumin, she received 1 dose of IV Lasix with excellent response, her lower extremity edema has resolved. Her renal function has remained stable. Hypomagnesemia / hypophosphatemia - due to poor po intake, repleted via IV while hospitalized, and will go home on by mouth replacement Alcoholic hepatitis - mild, elevated bilirubin, LFTs stable Chronic pancreatitis - continue Creon HLD - continue statin CAD with history of NSTEMI s/p stenting - no chest pain - continue Aspirin and Plavix Severe malnutrition - nutrition consult, continue nutritional supplements Microcytic Anemia - acute on chronic, no blood in stools, no melena reported, stable. Start iron Thrombocytopenia - due to ETOH / liver disease Type 2 diabetes mellitus - Diet controlled, most recent hemoglobin A1c was 6.0 in 2013 Hypertension - resume home meds   Discharge Instructions     Medication List    STOP taking these medications   cephALEXin 500 MG capsule Commonly known as:  KEFLEX   HYDROcodone-acetaminophen 5-325 MG tablet Commonly known as:  NORCO/VICODIN   methocarbamol 500 MG tablet Commonly known as:  ROBAXIN   oxyCODONE 5 MG immediate release tablet Commonly known as:  Oxy IR/ROXICODONE     TAKE these medications   acetaminophen 650 MG CR tablet Commonly known as:  TYLENOL 8 HOUR Take 1 tablet (650 mg total) by mouth every 8 (eight) hours as needed for pain.   aspirin 81 MG EC tablet Take 1 tablet (81 mg total) by mouth daily.   atorvastatin 40 MG tablet Commonly known as:  LIPITOR TAKE ONE (1) TABLET EACH DAY AT 6PM What changed:  See the new instructions.   clonazePAM 1 MG tablet Commonly known as:   KLONOPIN Take 1 tablet (1 mg total) by mouth at bedtime.   clopidogrel 75 MG tablet Commonly known as:  PLAVIX Take 75 mg by mouth daily.   diclofenac sodium 1 % Gel Commonly known as:  VOLTAREN Apply 4 g topically 4 (four) times daily. What changed:  when to take this  reasons to take this   feeding supplement (ENSURE ENLIVE) Liqd Take 237 mLs by mouth 3 (three) times daily between meals.   ferrous gluconate 324 MG tablet Commonly known as:  FERGON Take 1 tablet (324 mg total) by mouth daily with breakfast.   magnesium gluconate 30 MG tablet Commonly known as:  MAGONATE Take 1 tablet (30 mg total) by mouth 2 (two) times daily.   mirtazapine 15 MG tablet Commonly known as:  REMERON Take 1 tablet (15 mg total) by mouth at bedtime.   multivitamin tablet Take 1 tablet by mouth daily.   nicotine 14 mg/24hr patch Commonly known as:  NICODERM CQ Place 1 patch onto the skin daily.   nitroGLYCERIN 0.4 MG SL tablet Commonly known as:  NITROSTAT Place 1 tablet (0.4 mg total) under the tongue every 5 (five) minutes x 3 doses as needed for chest pain.   omeprazole 40 MG capsule Commonly known as:  PRILOSEC TAKE ONE CAPSULE BY MOUTH DAILY What changed:  See the new instructions.   Pancrelipase (Lip-Prot-Amyl) 24000 units Cpep Commonly known as:  CREON TAKE 3 CAPSULES 3 TIMES A DAY WITH MEALS AND TAKE ONE CAPSULE WITH SNACKS What changed:  how much to take  how to take this  when to take this  additional instructions   polyethylene glycol packet Commonly known as:  MIRALAX / GLYCOLAX Take 17 g by mouth daily as needed for moderate constipation.   QUEtiapine 50 MG tablet Commonly known as:  SEROQUEL Take 50 mg by mouth at bedtime.   traMADol 50 MG tablet Commonly known as:  ULTRAM Take 0.5 tablets (25 mg total) by mouth every 6 (six) hours as needed for moderate pain or severe pain.      Follow-up Information    [place] .   Contact  information: [place] [address] [phone]          Allergies  Allergen Reactions  . Diclofenac Sodium Other (See Comments)    REACTION: Chronic renal insufficiency. Patient can use the gel   . Other Other (See Comments)    Blackeyed peas and [place] Beans...break out in hives    Consultations:  None   Procedures/Studies:  None   Dg Chest 2 View  Result Date: 06/14/2016 CLINICAL DATA:  Bilateral ankle edema, cough EXAM: CHEST  2 VIEW COMPARISON:  05/16/2016 FINDINGS: There is no focal parenchymal opacity. There is no pleural effusion or pneumothorax. The heart and mediastinal contours are unremarkable. The osseous structures are unremarkable. IMPRESSION: No active cardiopulmonary disease. Electronically Signed   By: Kathreen Devoid   On: 06/14/2016  17:03   Ct Abdomen Pelvis W Contrast  Result Date: 06/08/2016 CLINICAL DATA:  Upper abdominal pain, 3 days duration. Nausea and vomiting today. Constipation. EXAM: CT ABDOMEN AND PELVIS WITH CONTRAST TECHNIQUE: Multidetector CT imaging of the abdomen and pelvis was performed using the standard protocol following bolus administration of intravenous contrast. CONTRAST:  28mL ISOVUE-300 IOPAMIDOL (ISOVUE-300) INJECTION 61% COMPARISON:  07/18/2012 FINDINGS: Lower chest: Left effusion layering dependently. Atelectasis of the dependent left lung. Coronary artery stent. Hepatobiliary: Profound fatty change of the liver. Previous cholecystectomy. Portal and hepatic veins are patent. Pancreas: Chronic pancreatic calcifications secondary to old pancreatitis. No evidence of active pancreatic process. Spleen: Upper limits of normal in size without focal lesion. Adrenals/Urinary Tract: Adrenal glands are normal. Left kidney is normal. Small cysts at the upper pole of the right kidney. Stomach/Bowel: Question edema of the colon wall that could go along with colitis. No evidence of bowel obstruction. Vascular/Lymphatic: Normal  Reproductive: Previous hysterectomy.  No pelvic mass. Other: Small amount of ascites. Periumbilical ventral hernia containing only fat. Right upper quadrant anterior abdominal wall hernia containing only fat. Musculoskeletal:  Normal IMPRESSION: Profound fatty change of the liver. 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 Probable edema of the wall of the colon consistent with colitis. Small amount of ascites. Small left pleural effusion layering dependently. Periumbilical hernia containing fat. Right upper quadrant anterior abdominal wall hernia containing only fat. Electronically Signed   By: Nelson Chimes M.D.   On: 06/08/2016 13:03   US Abdomen Limited Ruq  Result Date: 06/08/2016 CLINICAL DATA:  Three day history of right upper quadrant pain with nausea vomiting today. EXAM: US ABDOMEN LIMITED - RIGHT UPPER QUADRANT COMPARISON:  CT scan 06/08/2016 FINDINGS: Gallbladder: Surgically absent. Common bile duct: Diameter: 10 mm. Liver: Coarsening of the echotexture suggests steatosis. IMPRESSION: 1. Status postcholecystectomy. 2. Mild distention of the extrahepatic common duct for patient age. This may be related to prior cholecystectomy. Correlation with liver function test may prove helpful. Electronically Signed   By: Misty Stanley M.D.   On: 06/08/2016 18:53      Subjective: - no chest pain, shortness of breath, no abdominal pain, nausea or vomiting.    Discharge Exam: Vitals:   06/17/16 0603 06/17/16 0805  BP:  98/80  Pulse: 94 (!) 106  Resp: 18 16  Temp: 98.6 F (37 C) 99 F (37.2 C)   Vitals:   06/16/16 1450 06/16/16 2133 06/17/16 0603 06/17/16 0805  BP: 103/72 95/68  98/80  Pulse: 89 91 94 (!) 106  Resp: 18 18 18 16   Temp: 98.1 F (36.7 C) 98.3 F (36.8 C) 98.6 F (37 C) 99 F (37.2 C)  TempSrc: Oral Oral Oral Oral  SpO2: 98% 97%  98%  Weight:      Height:        General: Pt is alert, awake, not in acute distress Cardiovascular: RRR, S1/S2 +, no  rubs, no gallops Respiratory: CTA bilaterally, no wheezing, no rhonchi   The results of significant diagnostics from this hospitalization (including imaging, microbiology, ancillary and laboratory) are listed below for reference.     Microbiology: Recent Results (from the past 240 hour(s))  Urine culture     Status: Abnormal   Collection Time: 06/08/16 11:44 AM  Result Value Ref Range Status   Specimen Description URINE, RANDOM  Final   Special Requests NONE  Final   Culture MULTIPLE SPECIES PRESENT, SUGGEST RECOLLECTION (A)  Final   Report Status 06/09/2016 FINAL  Final  Labs: BNP (last 3 results) No results for input(s): BNP in the last 8760 hours. Basic Metabolic Panel:  Recent Labs Lab 06/14/16 1428 06/15/16 0558 06/16/16 0554  NA 137 139 140  K 4.7 4.3 3.9  CL 112* 113* 115*  CO2 18* 20* 22  GLUCOSE 74 70 98  BUN <5* 5* 6  CREATININE 0.97 0.73 0.84  CALCIUM 7.6* 7.3* 7.0*  MG  --   --  1.1*  PHOS  --   --  2.1*   Liver Function Tests:  Recent Labs Lab 06/14/16 1428 06/15/16 0558 06/16/16 0554  AST 96* 81* 78*  ALT 31 27 26   ALKPHOS 180* 151* 133*  BILITOT 4.1* 3.7* 3.9*  PROT 7.1 5.7* 5.2*  ALBUMIN 2.2* 1.7* 1.5*    Recent Labs Lab 06/14/16 1428  LIPASE 11   No results for input(s): AMMONIA in the last 168 hours. CBC:  Recent Labs Lab 06/14/16 1428 06/15/16 0558 06/16/16 0554  WBC 6.4 3.8* 3.7*  HGB 8.4* 6.7* 8.1*  HCT 23.6* 19.1* 22.4*  MCV 65.6* 65.9* 69.1*  PLT 181 137* 100*   Cardiac Enzymes: No results for input(s): CKTOTAL, CKMB, CKMBINDEX, TROPONINI in the last 168 hours. BNP: Invalid input(s): POCBNP CBG: No results for input(s): GLUCAP in the last 168 hours. D-Dimer No results for input(s): DDIMER in the last 72 hours. Hgb A1c No results for input(s): HGBA1C in the last 72 hours. Lipid Profile No results for input(s): CHOL, HDL, LDLCALC, TRIG, CHOLHDL, LDLDIRECT in the last 72 hours. Thyroid function studies No  results for input(s): TSH, T4TOTAL, T3FREE, THYROIDAB in the last 72 hours.  Invalid input(s): FREET3 Anemia work up  Recent Labs  06/15/16 0558  FOLATE 11.8  FERRITIN 486*  TIBC NOT CALCULATED  IRON 48   Urinalysis    Component Value Date/Time   COLORURINE ORANGE (A) 06/14/2016 1724   APPEARANCEUR CLEAR 06/14/2016 1724   LABSPEC 1.013 06/14/2016 1724   PHURINE 5.5 06/14/2016 1724   GLUCOSEU NEGATIVE 06/14/2016 1724   HGBUR NEGATIVE 06/14/2016 1724   BILIRUBINUR MODERATE (A) 06/14/2016 1724   KETONESUR NEGATIVE 06/14/2016 1724   PROTEINUR NEGATIVE 06/14/2016 1724   UROBILINOGEN 1.0 07/03/2012 2021   NITRITE POSITIVE (A) 06/14/2016 1724   LEUKOCYTESUR NEGATIVE 06/14/2016 1724   Sepsis Labs Invalid input(s): PROCALCITONIN,  WBC,  LACTICIDVEN Microbiology Recent Results (from the past 240 hour(s))  Urine culture     Status: Abnormal   Collection Time: 06/08/16 11:44 AM  Result Value Ref Range Status   Specimen Description URINE, RANDOM  Final   Special Requests NONE  Final   Culture MULTIPLE SPECIES PRESENT, SUGGEST RECOLLECTION (A)  Final   Report Status 06/09/2016 FINAL  Final     Time coordinating discharge: Over 30 minutes  SIGNED:  Marzetta Board, MD  [facility] 06/17/2016, 3:50 PM Pager [phone]  If 7PM-7AM, please contact night-coverage [email or web address] Password [email or web address]

## 2016-06-17 NOTE — Progress Notes (Signed)
PHYSICAL THERAPY BERG Balance   06/17/16 1344  Standardized Balance Assessment  Standardized Balance Assessment  Berg Balance Test  Berg Balance Test  Sit to Stand 3  Standing Unsupported 4  Sitting with Back Unsupported but Feet Supported on Floor or Stool 4  Stand to Sit 3  Transfers 3  Standing Unsupported with Eyes Closed 4  Standing Ubsupported with Feet Together 2     From Standing, Reach Forward with Outstretched Arm 4  From Standing Position, Pick up Object from Floor 4  From Standing Position, Turn to Look Behind Over each Shoulder 4  Turn 360 Degrees 4  Standing Unsupported, Alternately Place Feet on Step/Stool 1  Standing Unsupported, One Foot in Front 1  Standing on One Leg 1  Total Score 42    Moderate fall risk esp with one leg stance and alternate step up  Pt would benefit from an AD outside of the home environment Rica Koyanagi  PTA [place] Pager      [phone]

## 2016-06-17 NOTE — Care Management Note (Signed)
Case Management Note  Patient Details  Name: [place] F Silva MRN: [id number] Date of Birth: 1946/09/02  Subjective/Objective:         70 yo admitted with Leg swelling.           Action/Plan: Pt from home alone. PT recommendations communicated to pt. Pt offered choice for [facility] services and chose [facility]. [facility] rep contacted for referral. No other DC needs communicated.  Expected Discharge Date:                  Expected Discharge Plan:  [place]  In-House Referral:     Discharge planning Services  CM Consult  Post Acute Care Choice:  Home Health Choice offered to:  Patient  DME Arranged:    DME Agency:     HH Arranged:  RN, PT, Nurse's Aide [place] Agency:  [place]  Status of Service:  Completed, signed off  If discussed at [place] of Stay Meetings, dates discussed:    Additional CommentsLynnell Catalan, RN 06/17/2016, 11:16 AM  [phone]

## 2016-06-21 ENCOUNTER — Inpatient Hospital Stay (HOSPITAL_COMMUNITY)
Admission: EM | Admit: 2016-06-21 | Discharge: 2016-06-22 | DRG: 315 | Disposition: A | Discharge status: Home / Self Care | Payer: Medicare Other | Attending: Family Medicine | Admitting: Family Medicine

## 2016-06-21 ENCOUNTER — Emergency Department (HOSPITAL_COMMUNITY): Payer: Medicare Other

## 2016-06-21 ENCOUNTER — Encounter (HOSPITAL_COMMUNITY): Payer: Self-pay

## 2016-06-21 DIAGNOSIS — G8929 Other chronic pain: Secondary | ICD-10-CM | POA: Diagnosis present

## 2016-06-21 DIAGNOSIS — Z79899 Other long term (current) drug therapy: Secondary | ICD-10-CM

## 2016-06-21 DIAGNOSIS — F1910 Other psychoactive substance abuse, uncomplicated: Secondary | ICD-10-CM

## 2016-06-21 DIAGNOSIS — R609 Edema, unspecified: Secondary | ICD-10-CM | POA: Diagnosis present

## 2016-06-21 DIAGNOSIS — Y903 Blood alcohol level of 60-79 mg/100 ml: Secondary | ICD-10-CM | POA: Diagnosis present

## 2016-06-21 DIAGNOSIS — K219 Gastro-esophageal reflux disease without esophagitis: Secondary | ICD-10-CM | POA: Diagnosis present

## 2016-06-21 DIAGNOSIS — E860 Dehydration: Secondary | ICD-10-CM | POA: Diagnosis present

## 2016-06-21 DIAGNOSIS — Z91018 Allergy to other foods: Secondary | ICD-10-CM

## 2016-06-21 DIAGNOSIS — I252 Old myocardial infarction: Secondary | ICD-10-CM

## 2016-06-21 DIAGNOSIS — Z7902 Long term (current) use of antithrombotics/antiplatelets: Secondary | ICD-10-CM

## 2016-06-21 DIAGNOSIS — F1010 Alcohol abuse, uncomplicated: Secondary | ICD-10-CM | POA: Diagnosis not present

## 2016-06-21 DIAGNOSIS — E119 Type 2 diabetes mellitus without complications: Secondary | ICD-10-CM | POA: Diagnosis present

## 2016-06-21 DIAGNOSIS — I739 Peripheral vascular disease, unspecified: Secondary | ICD-10-CM | POA: Diagnosis present

## 2016-06-21 DIAGNOSIS — I2510 Atherosclerotic heart disease of native coronary artery without angina pectoris: Secondary | ICD-10-CM | POA: Diagnosis present

## 2016-06-21 DIAGNOSIS — Z833 Family history of diabetes mellitus: Secondary | ICD-10-CM

## 2016-06-21 DIAGNOSIS — Z7982 Long term (current) use of aspirin: Secondary | ICD-10-CM | POA: Diagnosis not present

## 2016-06-21 DIAGNOSIS — I959 Hypotension, unspecified: Secondary | ICD-10-CM | POA: Diagnosis present

## 2016-06-21 DIAGNOSIS — R1013 Epigastric pain: Secondary | ICD-10-CM

## 2016-06-21 DIAGNOSIS — K8681 Exocrine pancreatic insufficiency: Secondary | ICD-10-CM | POA: Diagnosis present

## 2016-06-21 DIAGNOSIS — E785 Hyperlipidemia, unspecified: Secondary | ICD-10-CM | POA: Diagnosis present

## 2016-06-21 DIAGNOSIS — F17210 Nicotine dependence, cigarettes, uncomplicated: Secondary | ICD-10-CM | POA: Diagnosis present

## 2016-06-21 DIAGNOSIS — K760 Fatty (change of) liver, not elsewhere classified: Secondary | ICD-10-CM | POA: Diagnosis present

## 2016-06-21 DIAGNOSIS — F10129 Alcohol abuse with intoxication, unspecified: Secondary | ICD-10-CM | POA: Diagnosis present

## 2016-06-21 DIAGNOSIS — Z800 Family history of malignant neoplasm of digestive organs: Secondary | ICD-10-CM

## 2016-06-21 DIAGNOSIS — Z8249 Family history of ischemic heart disease and other diseases of the circulatory system: Secondary | ICD-10-CM

## 2016-06-21 DIAGNOSIS — I129 Hypertensive chronic kidney disease with stage 1 through stage 4 chronic kidney disease, or unspecified chronic kidney disease: Secondary | ICD-10-CM | POA: Diagnosis present

## 2016-06-21 DIAGNOSIS — K861 Other chronic pancreatitis: Secondary | ICD-10-CM | POA: Diagnosis present

## 2016-06-21 DIAGNOSIS — R531 Weakness: Secondary | ICD-10-CM | POA: Diagnosis present

## 2016-06-21 DIAGNOSIS — F17200 Nicotine dependence, unspecified, uncomplicated: Secondary | ICD-10-CM | POA: Diagnosis present

## 2016-06-21 DIAGNOSIS — R197 Diarrhea, unspecified: Secondary | ICD-10-CM

## 2016-06-21 DIAGNOSIS — Z955 Presence of coronary angioplasty implant and graft: Secondary | ICD-10-CM

## 2016-06-21 DIAGNOSIS — R627 Adult failure to thrive: Secondary | ICD-10-CM | POA: Diagnosis present

## 2016-06-21 DIAGNOSIS — Z888 Allergy status to other drugs, medicaments and biological substances status: Secondary | ICD-10-CM

## 2016-06-21 DIAGNOSIS — K7010 Alcoholic hepatitis without ascites: Secondary | ICD-10-CM | POA: Diagnosis present

## 2016-06-21 DIAGNOSIS — K529 Noninfective gastroenteritis and colitis, unspecified: Secondary | ICD-10-CM | POA: Diagnosis present

## 2016-06-21 LAB — COMPREHENSIVE METABOLIC PANEL
ALK PHOS: 164 U/L — AB (ref 38–126)
ALT: 29 U/L (ref 14–54)
ANION GAP: 9 (ref 5–15)
AST: 82 U/L — ABNORMAL HIGH (ref 15–41)
Albumin: 2 g/dL — ABNORMAL LOW (ref 3.5–5.0)
BILIRUBIN TOTAL: 5.1 mg/dL — AB (ref 0.3–1.2)
BUN: 7 mg/dL (ref 6–20)
CALCIUM: 7.7 mg/dL — AB (ref 8.9–10.3)
CO2: 21 mmol/L — ABNORMAL LOW (ref 22–32)
Chloride: 109 mmol/L (ref 101–111)
Creatinine, Ser: 0.77 mg/dL (ref 0.44–1.00)
GFR calc non Af Amer: >60 mL/min (ref >60)
Glucose, Bld: 95 mg/dL (ref 65–99)
POTASSIUM: 4.1 mmol/L (ref 3.5–5.1)
SODIUM: 139 mmol/L (ref 135–145)
TOTAL PROTEIN: 7 g/dL (ref 6.5–8.1)

## 2016-06-21 LAB — RAPID URINE DRUG SCREEN, HOSP PERFORMED
Amphetamines: NOT DETECTED
BARBITURATES: NOT DETECTED
Benzodiazepines: POSITIVE — AB
Cocaine: NOT DETECTED
Opiates: NOT DETECTED
TETRAHYDROCANNABINOL: NOT DETECTED

## 2016-06-21 LAB — URINALYSIS, ROUTINE W REFLEX MICROSCOPIC
Glucose, UA: NEGATIVE mg/dL
Hgb urine dipstick: NEGATIVE
KETONES UR: NEGATIVE mg/dL
NITRITE: NEGATIVE
PROTEIN: NEGATIVE mg/dL
Specific Gravity, Urine: 1.01 (ref 1.005–1.030)
pH: 5.5 (ref 5.0–8.0)

## 2016-06-21 LAB — CBC WITH DIFFERENTIAL/PLATELET
BASOS ABS: 0 10*3/uL (ref 0.0–0.1)
Basophils Relative: 0 %
EOS PCT: 1 %
Eosinophils Absolute: 0.1 10*3/uL (ref 0.0–0.7)
HEMATOCRIT: 30.3 % — AB (ref 36.0–46.0)
Hemoglobin: 10.4 g/dL — ABNORMAL LOW (ref 12.0–15.0)
Lymphocytes Relative: 28 %
Lymphs Abs: 1.7 10*3/uL (ref 0.7–4.0)
MCH: 24.1 pg — ABNORMAL LOW (ref 26.0–34.0)
MCHC: 34.3 g/dL (ref 30.0–36.0)
MCV: 70.3 fL — ABNORMAL LOW (ref 78.0–100.0)
MONO ABS: 0.5 10*3/uL (ref 0.1–1.0)
MONOS PCT: 9 %
NEUTROS PCT: 62 %
Neutro Abs: 3.6 10*3/uL (ref 1.7–7.7)
Platelets: 144 10*3/uL — ABNORMAL LOW (ref 150–400)
RBC: 4.31 MIL/uL (ref 3.87–5.11)
RDW: 21.7 % — AB (ref 11.5–15.5)
WBC: 5.9 10*3/uL (ref 4.0–10.5)

## 2016-06-21 LAB — URINE MICROSCOPIC-ADD ON: RBC / HPF: NONE SEEN RBC/hpf (ref 0–5)

## 2016-06-21 LAB — ETHANOL: Alcohol, Ethyl (B): 77 mg/dL — ABNORMAL HIGH (ref <5)

## 2016-06-21 LAB — LIPASE, BLOOD: LIPASE: 11 U/L (ref 11–51)

## 2016-06-21 LAB — BRAIN NATRIURETIC PEPTIDE: B NATRIURETIC PEPTIDE 5: 57.7 pg/mL (ref 0.0–100.0)

## 2016-06-21 MED ORDER — CLONAZEPAM 1 MG PO TABS
1.0000 mg | ORAL_TABLET | Freq: Every day | ORAL | Status: DC
Start: 2016-06-21 — End: 2016-06-22
  Administered 2016-06-21: 1 mg via ORAL
  Filled 2016-06-21: qty 1

## 2016-06-21 MED ORDER — SODIUM CHLORIDE 0.9 % IV BOLUS (SEPSIS)
500.0000 mL | Freq: Once | INTRAVENOUS | Status: AC
  Administered 2016-06-21: 500 mL via INTRAVENOUS

## 2016-06-21 MED ORDER — PANCRELIPASE (LIP-PROT-AMYL) 36000-114000 UNITS PO CPEP
72000.0000 [IU] | ORAL_CAPSULE | Freq: Three times a day (TID) | ORAL | Status: DC
  Administered 2016-06-22: 72000 [IU] via ORAL
  Filled 2016-06-21 (×3): qty 2

## 2016-06-21 MED ORDER — POLYETHYLENE GLYCOL 3350 17 G PO PACK: 17.0000 g | PACK | Freq: Every day | ORAL | Status: DC | PRN

## 2016-06-21 MED ORDER — TRAMADOL HCL 50 MG PO TABS
25.0000 mg | ORAL_TABLET | Freq: Four times a day (QID) | ORAL | Status: DC | PRN
  Administered 2016-06-21: 25 mg via ORAL
  Filled 2016-06-21: qty 1

## 2016-06-21 MED ORDER — ENSURE ENLIVE PO LIQD
237.0000 mL | Freq: Three times a day (TID) | ORAL | Status: DC
  Administered 2016-06-21: 237 mL via ORAL

## 2016-06-21 MED ORDER — LORAZEPAM 2 MG/ML IJ SOLN: 0.0000 mg | Freq: Four times a day (QID) | INTRAMUSCULAR | Status: DC

## 2016-06-21 MED ORDER — LORAZEPAM 2 MG/ML IJ SOLN: 1.0000 mg | Freq: Four times a day (QID) | INTRAMUSCULAR | Status: DC | PRN

## 2016-06-21 MED ORDER — NICOTINE 21 MG/24HR TD PT24
21.0000 mg | MEDICATED_PATCH | Freq: Every day | TRANSDERMAL | Status: DC
  Administered 2016-06-21: 21 mg via TRANSDERMAL
  Filled 2016-06-21: qty 1

## 2016-06-21 MED ORDER — VITAMIN B-1 100 MG PO TABS
100.0000 mg | ORAL_TABLET | Freq: Every day | ORAL | Status: DC
  Administered 2016-06-22: 100 mg via ORAL
  Filled 2016-06-21: qty 1

## 2016-06-21 MED ORDER — MAGNESIUM CHLORIDE 64 MG PO TBEC
1.0000 | DELAYED_RELEASE_TABLET | Freq: Every day | ORAL | Status: DC
  Administered 2016-06-21: 64 mg via ORAL
  Filled 2016-06-21: qty 1

## 2016-06-21 MED ORDER — ENOXAPARIN SODIUM 40 MG/0.4ML ~~LOC~~ SOLN
40.0000 mg | SUBCUTANEOUS | Status: DC
  Administered 2016-06-21: 40 mg via SUBCUTANEOUS
  Filled 2016-06-21: qty 0.4

## 2016-06-21 MED ORDER — DICLOFENAC SODIUM 1 % TD GEL
4.0000 g | Freq: Four times a day (QID) | TRANSDERMAL | Status: DC
  Filled 2016-06-21: qty 100

## 2016-06-21 MED ORDER — ONDANSETRON HCL 4 MG PO TABS: 4.0000 mg | ORAL_TABLET | Freq: Four times a day (QID) | ORAL | Status: DC | PRN

## 2016-06-21 MED ORDER — MAGNESIUM GLUCONATE 30 MG PO TABS: 30.0000 mg | ORAL_TABLET | Freq: Two times a day (BID) | ORAL | Status: DC

## 2016-06-21 MED ORDER — LORAZEPAM 2 MG/ML IJ SOLN
0.0000 mg | Freq: Two times a day (BID) | INTRAMUSCULAR | Status: DC
Start: 2016-06-23 — End: 2016-06-22

## 2016-06-21 MED ORDER — SODIUM CHLORIDE 0.9 % IV SOLN
INTRAVENOUS | Status: DC
  Administered 2016-06-21: 20:00:00 via INTRAVENOUS

## 2016-06-21 MED ORDER — FOLIC ACID 1 MG PO TABS
1.0000 mg | ORAL_TABLET | Freq: Every day | ORAL | Status: DC
  Administered 2016-06-22: 1 mg via ORAL
  Filled 2016-06-21: qty 1

## 2016-06-21 MED ORDER — ONDANSETRON HCL 4 MG/2ML IJ SOLN: 4.0000 mg | Freq: Four times a day (QID) | INTRAMUSCULAR | Status: DC | PRN

## 2016-06-21 MED ORDER — PANTOPRAZOLE SODIUM 40 MG PO TBEC
40.0000 mg | DELAYED_RELEASE_TABLET | Freq: Every day | ORAL | Status: DC
  Administered 2016-06-22: 40 mg via ORAL
  Filled 2016-06-21: qty 1

## 2016-06-21 MED ORDER — MIRTAZAPINE 15 MG PO TABS
15.0000 mg | ORAL_TABLET | Freq: Every day | ORAL | Status: DC
  Administered 2016-06-21: 15 mg via ORAL
  Filled 2016-06-21: qty 1

## 2016-06-21 MED ORDER — NICOTINE 14 MG/24HR TD PT24: 14.0000 mg | MEDICATED_PATCH | TRANSDERMAL | Status: DC

## 2016-06-21 MED ORDER — THERA VITAL M PO TABS: 1.0000 | ORAL_TABLET | Freq: Every day | ORAL | Status: DC

## 2016-06-21 MED ORDER — PANCRELIPASE (LIP-PROT-AMYL) 12000-38000 UNITS PO CPEP
24000.0000 [IU] | ORAL_CAPSULE | Freq: Every day | ORAL | Status: DC | PRN
  Filled 2016-06-21: qty 2

## 2016-06-21 MED ORDER — CLOPIDOGREL BISULFATE 75 MG PO TABS
75.0000 mg | ORAL_TABLET | Freq: Every day | ORAL | Status: DC
  Administered 2016-06-22: 75 mg via ORAL
  Filled 2016-06-21: qty 1

## 2016-06-21 MED ORDER — THIAMINE HCL 100 MG/ML IJ SOLN: 100.0000 mg | Freq: Every day | INTRAMUSCULAR | Status: DC

## 2016-06-21 MED ORDER — ADULT MULTIVITAMIN W/MINERALS CH
1.0000 | ORAL_TABLET | Freq: Every day | ORAL | Status: DC
  Administered 2016-06-22: 1 via ORAL
  Filled 2016-06-21: qty 1

## 2016-06-21 MED ORDER — ASPIRIN EC 81 MG PO TBEC
81.0000 mg | DELAYED_RELEASE_TABLET | Freq: Every day | ORAL | Status: DC
  Administered 2016-06-22: 81 mg via ORAL
  Filled 2016-06-21: qty 1

## 2016-06-21 MED ORDER — FERROUS GLUCONATE 324 (38 FE) MG PO TABS
324.0000 mg | ORAL_TABLET | Freq: Every day | ORAL | Status: DC
  Administered 2016-06-22: 324 mg via ORAL
  Filled 2016-06-21: qty 1

## 2016-06-21 MED ORDER — ATORVASTATIN CALCIUM 10 MG PO TABS: 10.0000 mg | ORAL_TABLET | Freq: Every day | ORAL | Status: DC

## 2016-06-21 MED ORDER — LORAZEPAM 1 MG PO TABS: 1.0000 mg | ORAL_TABLET | Freq: Four times a day (QID) | ORAL | Status: DC | PRN

## 2016-06-21 MED ORDER — QUETIAPINE FUMARATE 25 MG PO TABS
50.0000 mg | ORAL_TABLET | Freq: Every day | ORAL | Status: DC
  Administered 2016-06-21: 50 mg via ORAL
  Filled 2016-06-21: qty 2

## 2016-06-21 MED ORDER — NITROGLYCERIN 0.4 MG SL SUBL: 0.4000 mg | SUBLINGUAL_TABLET | SUBLINGUAL | Status: DC | PRN

## 2016-06-21 NOTE — Progress Notes (Signed)
ED CM consulted by EDP, [place] about pt  Inquired about options for placement, pt just d/c 06/17/16 after inpatient stay Cm discussed contact from [facility] about pt home, feces, and consult for neglect hopefully to initiate APS entered by ED CM  At this time EDP is stating pt is stating she wants to d/c home  CM discussed there is no intervention to prevent pt's "bad choice"  Updated ED SW

## 2016-06-21 NOTE — Progress Notes (Signed)
CSW received consult to speak with pt regarding her discharge plans. Pt is from home alone and has limited help. Pt stated that her nieces come visit every other day and her neighbors come to help her.CSW informed pt that she would qualify for SNF due to recently being admitted. Pt refused SNF stating "I have lived by myself for 11 years and want to go home". CSW informed ED CM of her decision. No further needs from CSW.   Kingsley Spittle, [place] Clinical Social Worker [phone]

## 2016-06-21 NOTE — ED Triage Notes (Signed)
Per EMS, pt from home.  Pt c/o pedal edema bilateral.  Pedal edema started before last hospitalization.  D/C on 8/25.  Initial call was hypotensive.  Normal vs on EMS arrival.  Pt ambulating at scene.  No hx of CHF documented.  No increased weakness since d/c.  Pt is alert and oriented.  Vitals:  112/78, hr 100, resp 18, 96% ra, cbg 143

## 2016-06-21 NOTE — Progress Notes (Signed)
CSW was asked to speak with pt's niece regarding plans for discharge. Pt's niece would like pt to go to SNF however pt has refused the option for SNF multiple times. Pt became angry when niece was in room and asked her to leave. Pt's niece asked the CSW to contact her for any updates. CSW will continue to update.   Kingsley Spittle, [place] Clinical Social Worker [phone]

## 2016-06-21 NOTE — ED Notes (Signed)
Patient transported to X-ray 

## 2016-06-21 NOTE — ED Notes (Signed)
I attempted twice to collect labs and was unsuccessful 

## 2016-06-21 NOTE — Progress Notes (Signed)
No care  Feces  [facility] not taking back

## 2016-06-21 NOTE — Progress Notes (Signed)
CM visited by [facility] of [facility] to provide an update on the pt  CM entered in SW consult with comments: [facility] staff reporting pt in feces all over the home (trail of feces in home, on a gown, which is not new), alcoholism, low bp, and not having primary care giver in home to assist this pt  [facility] is beyond home care level of care and [facility] will not be re admitting this pt for home services as of 06/21/16

## 2016-06-21 NOTE — ED Notes (Signed)
MD at bedside. 

## 2016-06-21 NOTE — Progress Notes (Addendum)
[facility] consult entered  45 CM reviewed with pt that [facility] would not be returning to provide services Offered another agency from home health list but pt REFUSED to have another home health agency in her home. Cm attempted to encourage she accept CM offer for another agency but pt refused x 2 Reports she does live alone but has nieces visiting and "the Re[facility] Cliche will take care of me"  Discussed with pt that she would need to be able to car for herself well Pt did agreed to have CM give her a list of home health agencies and a list of private duty nursing services and to speak with her pcp if she changed her mind

## 2016-06-21 NOTE — H&P (Signed)
History and Physical    [name] Foley [id number] DOB: 1946/06/25 DOA: 06/21/2016  Referring MD/NP/PA: Dr. Alvino Chapel   PCP: Philis Fendt, MD   Patient coming from: Home   Chief Complaint: failure to thrive   HPI:  70 y.o.femalewith hx of MI/ CAD w stent, tobacco, chron pancreatitis , hx etoh abuse and HLD, multiple recent admissions for weakness and failure to thrive in the setting of ongoing alcohol abuse, last discharge 06/18/2016, prior to that was hospitalized for 3 days 8/16- 8.18/17 w acute on chron abd pain thought to be related to pancreatitis, pt left AMA. Pt apparently lives alone and her niece is at bedside and explains that family does not feel comfortable that pt is at home unsupervised. They are asking to have her admitted for placement. Please note that pt is rather poor historian and niece at bedside explains that pt has had ongoing watery diarrhea, has not been eating anything and pt says she had had lots of abd cramping and no appetite. No chest pain or dyspnea reported. No fevers, chills, no urinary concerns. Pt denies recent alcohol use but please note that alcohol level 77 on admission.   ED Course: Pt is hemodynamically stable, BP 80's/60's, pt started on IVF and TRH asked to admit for further evaluation of alcohol intoxication, dehydration. Telemetry bed requested.   Review of Systems:  Constitutional: Negative for fever, chills, diaphoresis HENT: Negative for ear pain, nosebleeds, congestion, facial swelling, rhinorrhea, neck pain, neck stiffness and ear discharge.   Eyes: Negative for pain, discharge, redness, itching and visual disturbance.  Respiratory: Negative for cough, choking, chest tightness, shortness of breath Cardiovascular: Negative for chest pain, palpitations and leg swelling.  Gastrointestinal: Negative for abdominal distention.  Genitourinary: Negative for dysuria, urgency, frequency, hematuria, flank pain, decreased urine volume,  difficulty urinating and dyspareunia.  Musculoskeletal: Negative for back pain, joint swelling, arthralgias and gait problem.  Neurological: Negative for dizziness, tremors, seizures, syncope, facial asymmetry.  Hematological: Negative for adenopathy. Psychiatric/Behavioral: Negative for hallucinations, behavioral problems  Past Medical History:  Diagnosis Date  . Alcohol abuse    Abstinent since 2009  . Chronic pancreatitis ([place])   . Colon adenoma 03/2012   Diminutive cecal  . Common bile duct stricture   . Coronary artery disease    DES x 2 to LAD 2007, DES to LAD 07/2012  . Depression   . Essential hypertension   . Fatty liver   . GERD (gastroesophageal reflux disease)   . GERD (gastroesophageal reflux disease)   . Hemorrhoids   . Hiatal hernia   . History of pneumonia 11/2011  . Hyperlipidemia   . PAD (peripheral artery disease) (H[facility])    Occluded right external iliac artery   . Psoriasis   . ST elevation myocardial infarction (STEMI) of anterior wall ([place]) 07/2012  . Type 2 diabetes mellitus ([place])     Past Surgical History:  Procedure Laterality Date  . ABDOMINAL HYSTERECTOMY  1994  . CHOLECYSTECTOMY  1976  . COLONOSCOPY     multiple  . ERCP  07/25/2012   Procedure: ENDOSCOPIC RETROGRADE CHOLANGIOPANCREATOGRAPHY (ERCP);  Surgeon: Gatha Mayer, MD;  Location: [facility];  Service: Endoscopy;  Laterality: N/A;  with stent  . ESOPHAGOGASTRODUODENOSCOPY  10/25/2012   Procedure: ESOPHAGOGASTRODUODENOSCOPY (EGD);  Surgeon: Gatha Mayer, MD;  Location: [facility];  Service: Endoscopy;  Laterality: N/A;  EGD with stent removal using ERCP scope  . EUS  01/05/2012   Procedure: UPPER ENDOSCOPIC ULTRASOUND (EUS)  LINEAR;  Surgeon: Milus Banister, MD;  Location: [facility];  Service: Endoscopy;  Laterality: N/A;  radial linear  . HEMORRHOID SURGERY  09/17/2013   THD hem ligation/pexy  . LEFT HEART CATH N/A 08/10/2012   Procedure: LEFT HEART CATH;  Surgeon: Peter M Martinique,  MD;  Location: [facility] CATH LAB;  Service: Cardiovascular;  Laterality: N/A;  . PERCUTANEOUS CORONARY STENT INTERVENTION (PCI-S)  08/10/2012   Procedure: PERCUTANEOUS CORONARY STENT INTERVENTION (PCI-S);  Surgeon: Peter M Martinique, MD;  Location: [facility] CATH LAB;  Service: Cardiovascular;;  . STRESS MYOCARDIAL PERFUSION  04/20/10   NORMAL PATTERN OF PERFUSION IN ALL REGIONS. LV SIZE IS NORMAL. EF= 61%. NO SIGN ISCHEMIA DEMONSTRATED.  Marland Kitchen TRANSTHORACIC ECHOCARDIOGRAM  04/20/10   PROXIMAL SEPTAL THICKENING IS NOTED. LV SYSTOLIC FUNCTION IS NORMAL. EF= >55%. LEFT ATRIAL SIZE IS NORMAL. RIGHT VENTRICULAR SYSTOLIC PRESSURE IS NORMAL. AV APPEARS TO BE MILDLY SCLEROTIC. NO AS.    Social history:  reports that she has quit smoking. Her smoking use included Cigarettes. She has a 7.50 pack-year smoking history. She has never used smokeless tobacco. She reports that she does not drink alcohol or use drugs.  Ambulation  - ambulatory at home with no walker   Allergies  Allergen Reactions  . Diclofenac Sodium Other (See Comments)    REACTION: Chronic renal insufficiency. Patient can use the gel   . Other Other (See Comments)    Blackeyed peas and P[place] Beans...break out in hives    Family History  Problem Relation Age of Onset  . Colon cancer Mother     Colon resection  . Diabetes Mother   . Cirrhosis Father   . Coronary artery disease Brother   . Renal Disease Brother   . Malignant hyperthermia Neg Hx    Medication Sig  aspirin EC 81 MG EC tablet Take 1 tablet (81 mg total) by mouth daily.  atorvastatin  40 MG tablet TAKE ONE (1) TABLET EACH DAY AT 6PM  clonazePAM 1 MG tablet Take 1 tablet (1 mg total) by mouth at bedtime.  clopidogrel (PLAVIX) 75 MG tablet Take 75 mg by mouth daily.    mirtazapine  15 MG tablet Take 1 tablet (15 mg total) by mouth at bedtime.  nicotine  14 mg/24hr patch Place 1 patch onto the skin daily.  omeprazole 40 MG capsule TAKE ONE CAPSULE BY MOUTH DAILY  Pancrelipase,  Lip-Prot-Amyl, (CREON) 24000 UNITS CPEP TAKE 3 CAPSULES 3 TIMES A DAY WITH MEALS AND TAKE ONE CAPSULE WITH SNACKS  QUEtiapine 50  MG tablet Take 50 mg by mouth at bedtime.  traMADol (ULTRAM) 50 MG tablet Take 0.5 tablets every 6 (six) hours as needed   ferrous gluconate (FERGON) 324 MG tablet Take 1 tablet (324 mg total) by mouth daily with breakfast. Patient not taking: Reported on 06/21/2016  magnesium gluconate (MAGONATE) 30 MG tablet Take 1 tablet (30 mg total) by mouth 2 (two) times daily. Patient not taking: Reported on 06/21/2016    Physical Exam: Vitals:   06/21/16 1340 06/21/16 1550 06/21/16 1755  BP: 104/72 91/75 (!) 89/69  Pulse: 100 91 90  Resp: 18 24 18   Temp: 97.4 F (36.3 C)    TempSrc: Oral    SpO2: 98% 96% 97%    Constitutional: NAD, calm, comfortable, cachectic  Vitals:   06/21/16 1340 06/21/16 1550 06/21/16 1755  BP: 104/72 91/75 (!) 89/69  Pulse: 100 91 90  Resp: 18 24 18   Temp: 97.4 F (36.3 C)    TempSrc:  Oral    SpO2: 98% 96% 97%   Eyes: PERRL, lids and conjunctivae normal ENMT: Mucous membranes dry. Posterior pharynx clear of any exudate or lesions.Normal dentition.  Neck: normal, supple, no masses, no thyromegaly Respiratory: clear to auscultation bilaterally, no wheezing, no crackles. Normal respiratory effort.  Cardiovascular: Regular rate and rhythm, no murmurs / rubs / gallops. No extremity edema. 2+ pedal pulses.  Abdomen: no tenderness, no masses palpated. No hepatosplenomegaly. Bowel sounds positive.  Musculoskeletal: no clubbing / cyanosis. No joint deformity upper and lower extremities.  Skin: no rashes, lesions, ulcers. No induration Neurologic: CN 2-12 grossly intact. Sensation intact, DTR normal. Strength 5/5 in all 4.  Psychiatric: Normal judgment and insight. Alert and oriented x 3. Normal mood.   Labs on Admission: I have personally reviewed following labs and imaging studies  CBC:  Recent Labs Lab 06/15/16 0558 06/16/16 0554  06/21/16 1522  WBC 3.8* 3.7* 5.9  NEUTROABS  --   --  3.6  HGB 6.7* 8.1* 10.4*  HCT 19.1* 22.4* 30.3*  MCV 65.9* 69.1* 70.3*  PLT 137* 100* 123456*   Basic Metabolic Panel:  Recent Labs Lab 06/15/16 0558 06/16/16 0554 06/21/16 1522  NA 139 140 139  K 4.3 3.9 4.1  CL 113* 115* 109  CO2 20* 22 21*  GLUCOSE 70 98 95  BUN 5* 6 7  CREATININE 0.73 0.84 0.77  CALCIUM 7.3* 7.0* 7.7*  MG  --  1.1*  --   PHOS  --  2.1*  --    Liver Function Tests:  Recent Labs Lab 06/15/16 0558 06/16/16 0554 06/21/16 1522  AST 81* 78* 82*  ALT 27 26 29   ALKPHOS 151* 133* 164*  BILITOT 3.7* 3.9* 5.1*  PROT 5.7* 5.2* 7.0  ALBUMIN 1.7* 1.5* 2.0*    Recent Labs Lab 06/21/16 1522  LIPASE 11   No results for input(s): AMMONIA in the last 168 hours. Coagulation Profile:  Recent Labs Lab 06/15/16 0558  INR 2.42   Urine analysis:    Component Value Date/Time   COLORURINE AMBER (A) 06/21/2016 1631   APPEARANCEUR CLEAR 06/21/2016 1631   LABSPEC 1.010 06/21/2016 1631   PHURINE 5.5 06/21/2016 1631   GLUCOSEU NEGATIVE 06/21/2016 1631   HGBUR NEGATIVE 06/21/2016 1631   BILIRUBINUR SMALL (A) 06/21/2016 1631   KETONESUR NEGATIVE 06/21/2016 1631   PROTEINUR NEGATIVE 06/21/2016 1631   UROBILINOGEN 1.0 07/03/2012 2021   NITRITE NEGATIVE 06/21/2016 1631   LEUKOCYTESUR TRACE (A) 06/21/2016 1631   Radiological Exams on Admission: Dg Abd Acute W/chest  Result Date: 06/21/2016 CLINICAL DATA:  Lower extremity edema, hypotensive. History of diabetes and hypertension. EXAM: DG ABDOMEN ACUTE W/ 1V CHEST COMPARISON:  Chest radiograph June 14, 2016 and CT abdomen and pelvis June 08, 2016 FINDINGS: Cardiomediastinal silhouette is normal. Coronary artery stent. LEFT lung base atelectasis/scarring. Lungs are otherwise clear, no pleural effusions. No pneumothorax. Soft tissue planes and included osseous structures are unremarkable. Bowel gas pattern is nondilated. Scattered nondistended small bowel  air-fluid level is predominately in the pelvis. Paucity of large bowel gas. No intra-abdominal mass effect, pathologic calcifications or free air. Soft tissue planes and included osseous structures are non-suspicious. Coarse pancreatic calcifications are seen on prior CT. Surgical clips in the included right abdomen compatible with cholecystectomy. Surgical clips in RIGHT inguinal soft tissues compatible with prior vascular access. IMPRESSION: No acute cardiopulmonary process. Nondistended small bowel with air-fluid levels suggesting enteritis though, partial or early small bowel obstruction may have this appearance. Electronically Signed  By: Elon Alas M.D.   On: 06/21/2016 16:43    EKG: pending   Assessment/Plan  Active Problems:   Hypotension - suspect this is multifactorial and secondary to dehydration, alcohol use, diarrhea  - will admit to telemetry unit - start NS at 75 cc/hr - place on regular diet     PANCREATITIS, CHRONIC  - continue pancrease supplement per home medical regimen     Chronic diarrhea - ? steatorrhea vs. IBS or both - niece says this is now much worse but review of records indicate chronic diarrhea - C. 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

## 2016-06-21 NOTE — ED Notes (Signed)
Report given to 5E. Pt will be transported after 1930.

## 2016-06-21 NOTE — ED Provider Notes (Addendum)
[facility] DEPT Provider Note   CSN: [id number] Arrival date & time: 06/21/16  1330     History   Chief Complaint Chief Complaint  Patient presents with  . Edema    HPI [name] Foley is a 70 y.o. female.  The history is provided by the patient.  Patient reportedly came in for low blood pressure. Home health was seen patient after recent discharge 4 days ago. Reportedly found to have feces all throughout the house. No caregiver at home and [facility] will no longer be able to manage her at home. Patient states her abdomen hurts. Reportedly has been drinking alcohol despite telling me previously she had been abstinent since 2009 during recent admission she admitted that she had been drinking. She has increased pain and swelling in her legs. There is reportedly known at home to help her. She states she will just have to live by herself.  Past Medical History:  Diagnosis Date  . Alcohol abuse    Abstinent since 2009  . Chronic pancreatitis ([place])   . Colon adenoma 03/2012   Diminutive cecal  . Common bile duct stricture   . Coronary artery disease    DES x 2 to LAD 2007, DES to LAD 07/2012  . Depression   . Essential hypertension   . Fatty liver   . GERD (gastroesophageal reflux disease)   . GERD (gastroesophageal reflux disease)   . Hemorrhoids   . Hiatal hernia   . History of pneumonia 11/2011  . Hyperlipidemia   . PAD (peripheral artery disease) (HCC)    Occluded right external iliac artery   . Psoriasis   . ST elevation myocardial infarction (STEMI) of anterior wall ([place]) 07/2012  . Type 2 diabetes mellitus [facility])     Patient Active Problem List   Diagnosis Date Noted  . Leg swelling 06/14/2016  . Alcohol abuse 06/14/2016  . Alcoholic liver disease ([place]) 06/14/2016  . Hepatomegaly 06/14/2016  . Hyperbilirubinemia 06/14/2016  . History of MI (myocardial infarction) 06/14/2016  . History of coronary artery stent placement 06/14/2016  . Debility 06/14/2016   . Pancreatitis 06/08/2016  . Type 2 diabetes mellitus ([place]) 08/28/2015  . CAD (coronary artery disease), native coronary artery 08/27/2015  . Peripheral arterial disease ([place]) 09/23/2014  . Abdominal pain, chronic, epigastric - from chronic pancreatitis 05/26/2014  . Hyperlipidemia   . Pruritus ani 09/24/2012  . Internal hemorrhoids with pain & intermittent prolapse s/p THD hem ligation/pexy 09/17/2013 09/24/2012  . STEMI (ST elevation myocardial infarction), with PCI with DES to mid LAD at site of previous stent 08/11/2012  . Periumbilical hernia [id number]  . Personal history of colonic polyp-adenoma 04/09/2012  . Chronic diarrhea - ? steatorrhea vs. IBS or both 04/09/2012  . Nasal congestion 04/06/2012  . Cough 04/06/2012  . CAP (community acquired pneumonia) 12/06/2011  . Common bile duct (CBD) stenosis? 12/03/2011  . PANCREATITIS, CHRONIC  08/13/2008  . PSORIASIS 08/13/2008  . ALCOHOL ABUSE, HX OF 03/21/2008  . Hyperlipidemia with target LDL less than 100 01/25/2007  . Coronary atherosclerosis 01/25/2007  . TOBACCO DEPENDENCE 12/21/2006  . HYPERTENSION, BENIGN SYSTEMIC 12/21/2006  . INSOMNIA NOS 12/21/2006    Past Surgical History:  Procedure Laterality Date  . ABDOMINAL HYSTERECTOMY  1994  . CHOLECYSTECTOMY  1976  . COLONOSCOPY     multiple  . ERCP  07/25/2012   Procedure: ENDOSCOPIC RETROGRADE CHOLANGIOPANCREATOGRAPHY (ERCP);  Surgeon: Gatha Mayer, MD;  Location: [facility];  Service: Endoscopy;  Laterality: N/A;  with  stent  . ESOPHAGOGASTRODUODENOSCOPY  10/25/2012   Procedure: ESOPHAGOGASTRODUODENOSCOPY (EGD);  Surgeon: Gatha Mayer, MD;  Location: [facility];  Service: Endoscopy;  Laterality: N/A;  EGD with stent removal using ERCP scope  . EUS  01/05/2012   Procedure: UPPER ENDOSCOPIC ULTRASOUND (EUS) LINEAR;  Surgeon: Milus Banister, MD;  Location: [facility] ENDOSCOPY;  Service: Endoscopy;  Laterality: N/A;  radial linear  . HEMORRHOID SURGERY  09/17/2013   THD hem  ligation/pexy  . LEFT HEART CATH N/A 08/10/2012   Procedure: LEFT HEART CATH;  Surgeon: Peter M Martinique, MD;  Location[facility] CATH LAB;  Service: Cardiovascular;  Laterality: N/A;  . PERCUTANEOUS CORONARY STENT INTERVENTION (PCI-S)  08/10/2012   Procedure: PERCUTANEOUS CORONARY STENT INTERVENTION (PCI-S);  Surgeon: Peter M Martinique, MD;  Location: [facility] CATH LAB;  Service: Cardiovascular;;  . STRESS MYOCARDIAL PERFUSION  04/20/10   NORMAL PATTERN OF PERFUSION IN ALL REGIONS. LV SIZE IS NORMAL. EF= 61%. NO SIGN ISCHEMIA DEMONSTRATED.  Marland Kitchen TRANSTHORACIC ECHOCARDIOGRAM  04/20/10   PROXIMAL SEPTAL THICKENING IS NOTED. LV SYSTOLIC FUNCTION IS NORMAL. EF= >55%. LEFT ATRIAL SIZE IS NORMAL. RIGHT VENTRICULAR SYSTOLIC PRESSURE IS NORMAL. AV APPEARS TO BE MILDLY SCLEROTIC. NO AS.    OB History    No data available       Home Medications    Prior to Admission medications   Medication Sig Start Date End Date Taking? Authorizing Provider  aspirin EC 81 MG EC tablet Take 1 tablet (81 mg total) by mouth daily. 08/13/12  Yes Isaiah Serge, NP  atorvastatin (LIPITOR) 40 MG tablet TAKE ONE (1) TABLET EACH DAY AT 6PM Patient taking differently: Take 40 mg by mouth each day 09/16/13  Yes Lorretta Harp, MD  clonazePAM (KLONOPIN) 1 MG tablet Take 1 tablet (1 mg total) by mouth at bedtime. 09/02/15  Yes Gatha Mayer, MD  clopidogrel (PLAVIX) 75 MG tablet Take 75 mg by mouth daily.     Yes Historical Provider, MD  diclofenac sodium (VOLTAREN) 1 % GEL Apply 4 g topically 4 (four) times daily. Patient taking differently: Apply 4 g topically 2 (two) times daily as needed (for pain).  09/02/15  Yes Gatha Mayer, MD  mirtazapine (REMERON) 15 MG tablet Take 1 tablet (15 mg total) by mouth at bedtime. 07/13/12  Yes Gatha Mayer, MD  nicotine (NICODERM CQ) 14 mg/24hr patch Place 1 patch onto the skin daily. 04/03/13  Yes Lorretta Harp, MD  nitroGLYCERIN (NITROSTAT) 0.4 MG SL tablet Place 1 tablet (0.4 mg total) under the  tongue every 5 (five) minutes x 3 doses as needed for chest pain. 09/02/15  Yes Lorretta Harp, MD  omeprazole (PRILOSEC) 40 MG capsule TAKE ONE CAPSULE BY MOUTH DAILY Patient taking differently: TAKE 40 MG BY MOUTH DAILY 01/21/16  Yes Gatha Mayer, MD  Pancrelipase, Lip-Prot-Amyl, (CREON) 24000 UNITS CPEP TAKE 3 CAPSULES 3 TIMES A DAY WITH MEALS AND TAKE ONE CAPSULE WITH SNACKS Patient taking differently: Take 24,000-72,000 Units by mouth See admin instructions. Take 72000 units by mouth 3 times daily with meals and take 24000 units by mouth with snack. 09/02/15  Yes Gatha Mayer, MD  polyethylene glycol [facility] / GLYCOLAX) packet Take 17 g by mouth daily as needed for moderate constipation.   Yes Historical Provider, MD  QUEtiapine (SEROQUEL) 50 MG tablet Take 50 mg by mouth at bedtime.   Yes Historical Provider, MD  traMADol (ULTRAM) 50 MG tablet Take 0.5 tablets (25 mg total) by mouth  every 6 (six) hours as needed for moderate pain or severe pain. 06/17/16  Yes Costin Karlyne Greenspan, MD  acetaminophen (TYLENOL 8 HOUR) 650 MG CR tablet Take 1 tablet (650 mg total) by mouth every 8 (eight) hours as needed for pain. Patient not taking: Reported on 06/21/2016 02/05/16   Varney Biles, MD  feeding supplement, ENSURE ENLIVE, (ENSURE ENLIVE) LIQD Take 237 mLs by mouth 3 (three) times daily between meals. 06/17/16   Costin Karlyne Greenspan, MD  ferrous gluconate (FERGON) 324 MG tablet Take 1 tablet (324 mg total) by mouth daily with breakfast. Patient not taking: Reported on 06/21/2016 06/17/16   Caren Griffins, MD  magnesium gluconate (MAGONATE) 30 MG tablet Take 1 tablet (30 mg total) by mouth 2 (two) times daily. Patient not taking: Reported on 06/21/2016 06/17/16   Caren Griffins, MD  Multiple Vitamins-Minerals (MULTIVITAMIN) tablet Take 1 tablet by mouth daily. Patient not taking: Reported on 06/21/2016 06/17/16   Caren Griffins, MD    Family History Family History  Problem Relation Age of Onset  . Colon  cancer Mother     Colon resection  . Diabetes Mother   . Cirrhosis Father   . Coronary artery disease Brother   . Renal Disease Brother   . Malignant hyperthermia Neg Hx     Social History Social History  Substance Use Topics  . Smoking status: Former Smoker    Packs/day: 0.50    Years: 15.00    Types: Cigarettes  . Smokeless tobacco: Never Used     Comment: 2 cigarettes a day  . Alcohol use No     Comment: Past h/o alcohol abuse, quit 4/09     Allergies   Diclofenac sodium and Other   Review of Systems Review of Systems  Constitutional: Positive for appetite change.  HENT: Negative for trouble swallowing.   Respiratory: Negative for stridor.   Cardiovascular: Positive for leg swelling. Negative for chest pain.  Gastrointestinal: Positive for abdominal pain. Negative for vomiting.  Genitourinary: Negative for dyspareunia.  Musculoskeletal: Negative for gait problem.  Skin: Negative for wound.  Hematological: Negative for adenopathy.  Psychiatric/Behavioral: Negative for confusion.     Physical Exam Updated Vital Signs BP (!) 89/69   Pulse 90   Temp 97.4 F (36.3 C) (Oral)   Resp 18   SpO2 97%   Physical Exam  Constitutional: She appears well-developed.  HENT:  Head: Atraumatic.  Eyes: EOM are normal.  Neck: No JVD present.  Cardiovascular: Normal rate.   Pulmonary/Chest: No respiratory distress.  Abdominal: Soft. There is tenderness.  Upper abdominal tenderness. Reducible umbilical hernia.  Musculoskeletal: She exhibits edema.  Moderate edema to bilateral LE.  Neurological: She is alert.  Skin: Skin is warm.     ED Treatments / Results  Labs (all labs ordered are listed, but only abnormal results are displayed) Labs Reviewed  COMPREHENSIVE METABOLIC PANEL - Abnormal; Notable for the following:       Result Value   CO2 21 (*)    Calcium 7.7 (*)    Albumin 2.0 (*)    AST 82 (*)    Alkaline Phosphatase 164 (*)    Total Bilirubin 5.1 (*)     All other components within normal limits  ETHANOL - Abnormal; Notable for the following:    Alcohol, Ethyl (B) 77 (*)    All other components within normal limits  CBC WITH DIFFERENTIAL/PLATELET - Abnormal; Notable for the following:    Hemoglobin 10.4 (*)  HCT 30.3 (*)    MCV 70.3 (*)    MCH 24.1 (*)    RDW 21.7 (*)    Platelets 144 (*)    All other components within normal limits  URINALYSIS, ROUTINE W REFLEX MICROSCOPIC (NOT AT [facility]) - Abnormal; Notable for the following:    Color, Urine AMBER (*)    Bilirubin Urine SMALL (*)    Leukocytes, UA TRACE (*)    All other components within normal limits  URINE MICROSCOPIC-ADD ON - Abnormal; Notable for the following:    Squamous Epithelial / LPF 0-5 (*)    Bacteria, UA MANY (*)    All other components within normal limits  BRAIN NATRIURETIC PEPTIDE  LIPASE, BLOOD  URINE RAPID DRUG SCREEN, HOSP PERFORMED    EKG  EKG Interpretation  Date/Time:  Tuesday June 21 2016 15:00:41 EDT Ventricular Rate:  93 PR Interval:    QRS Duration: 101 QT Interval:  373 QTC Calculation: 464 R Axis:   78 Text Interpretation:  Sinus rhythm Low voltage, extremity and precordial leads Confirmed by Alvino Chapel  MD, Edmonia Gonser [phone]) on 06/21/2016 3:04:55 PM       Radiology Dg Abd Acute W/chest  Result Date: 06/21/2016 CLINICAL DATA:  Lower extremity edema, hypotensive. History of diabetes and hypertension. EXAM: DG ABDOMEN ACUTE W/ 1V CHEST COMPARISON:  Chest radiograph June 14, 2016 and CT abdomen and pelvis June 08, 2016 FINDINGS: Cardiomediastinal silhouette is normal. Coronary artery stent. LEFT lung base atelectasis/scarring. Lungs are otherwise clear, no pleural effusions. No pneumothorax. Soft tissue planes and included osseous structures are unremarkable. Bowel gas pattern is nondilated. Scattered nondistended small bowel air-fluid level is predominately in the pelvis. Paucity of large bowel gas. No intra-abdominal mass effect, pathologic  calcifications or free air. Soft tissue planes and included osseous structures are non-suspicious. Coarse pancreatic calcifications are seen on prior CT. Surgical clips in the included right abdomen compatible with cholecystectomy. Surgical clips in RIGHT inguinal soft tissues compatible with prior vascular access. IMPRESSION: No acute cardiopulmonary process. Nondistended small bowel with air-fluid levels suggesting enteritis though, partial or early small bowel obstruction may have this appearance. Electronically Signed   By: Elon Alas M.D.   On: 06/21/2016 16:43    Procedures Procedures (including critical care time)  Medications Ordered in ED Medications  sodium chloride 0.9 % bolus 500 mL (not administered)     Initial Impression / Assessment and Plan / ED Course  I have reviewed the triage vital signs and the nursing notes.  Pertinent labs & imaging results that were available during my care of the patient were reviewed by me and considered in my medical decision making (see chart for details).  Clinical Course    Patient presented to generalized weakness. Has reportedly having diarrhea now. Had been stool all over the house. After discussions with the patient's niece she has been drinking alcohol and doing drugs at home. Clearly cannot take care of herself. The patient does not want placement. Has hypotension that is little worse than it has been chronically. LFTs are elevating. Will give IV fluids and attempted admission for further monitoring. Adult protective services had been consulted earlier today about her home care situation.  Patient did have reducible supraumbilical hernia. With the findings of the enteritis versus bowel obstruction may be worth monitoring to help rule out bowel obstruction.  Final Clinical Impressions(s) / ED Diagnoses   Final diagnoses:  Diarrhea, unspecified type  Substance abuse  Hypotension, unspecified hypotension type    New  Prescriptions New Prescriptions   No medications on file     Davonna Belling, MD 06/21/16 [place], MD 06/21/16 [phone]

## 2016-06-22 LAB — COMPREHENSIVE METABOLIC PANEL
ALK PHOS: 143 U/L — AB (ref 38–126)
ALT: 25 U/L (ref 14–54)
ANION GAP: 7 (ref 5–15)
AST: 75 U/L — ABNORMAL HIGH (ref 15–41)
Albumin: 1.7 g/dL — ABNORMAL LOW (ref 3.5–5.0)
BUN: <5 mg/dL — ABNORMAL LOW (ref 6–20)
CALCIUM: 7.4 mg/dL — AB (ref 8.9–10.3)
CHLORIDE: 113 mmol/L — AB (ref 101–111)
CO2: 21 mmol/L — AB (ref 22–32)
CREATININE: 0.73 mg/dL (ref 0.44–1.00)
Glucose, Bld: 88 mg/dL (ref 65–99)
Potassium: 3.8 mmol/L (ref 3.5–5.1)
SODIUM: 141 mmol/L (ref 135–145)
Total Bilirubin: 4.5 mg/dL — ABNORMAL HIGH (ref 0.3–1.2)
Total Protein: 6.1 g/dL — ABNORMAL LOW (ref 6.5–8.1)

## 2016-06-22 LAB — CBC
HCT: 26 % — ABNORMAL LOW (ref 36.0–46.0)
Hemoglobin: 9 g/dL — ABNORMAL LOW (ref 12.0–15.0)
MCH: 24.2 pg — AB (ref 26.0–34.0)
MCHC: 34.6 g/dL (ref 30.0–36.0)
MCV: 69.9 fL — AB (ref 78.0–100.0)
PLATELETS: 86 10*3/uL — AB (ref 150–400)
RBC: 3.72 MIL/uL — AB (ref 3.87–5.11)
RDW: 21.2 % — ABNORMAL HIGH (ref 11.5–15.5)
WBC: 4.4 10*3/uL (ref 4.0–10.5)

## 2016-06-22 LAB — GASTROINTESTINAL PANEL BY PCR, STOOL (REPLACES STOOL CULTURE)
ADENOVIRUS F40/41: NOT DETECTED
ASTROVIRUS: NOT DETECTED
CAMPYLOBACTER SPECIES: NOT DETECTED
Cryptosporidium: NOT DETECTED
Cyclospora cayetanensis: NOT DETECTED
E. COLI O157: NOT DETECTED
ENTEROPATHOGENIC E COLI (EPEC): NOT DETECTED
ENTEROTOXIGENIC E COLI (ETEC): NOT DETECTED
Entamoeba histolytica: NOT DETECTED
Enteroaggregative E coli (EAEC): NOT DETECTED
Giardia lamblia: NOT DETECTED
NOROVIRUS GI/GII: NOT DETECTED
Plesimonas shigelloides: NOT DETECTED
ROTAVIRUS A: NOT DETECTED
SAPOVIRUS (I, II, IV, AND V): NOT DETECTED
SHIGA LIKE TOXIN PRODUCING E COLI (STEC): NOT DETECTED
Salmonella species: NOT DETECTED
Shigella/Enteroinvasive E coli (EIEC): NOT DETECTED
VIBRIO SPECIES: NOT DETECTED
Vibrio cholerae: NOT DETECTED
Yersinia enterocolitica: NOT DETECTED

## 2016-06-22 LAB — C DIFFICILE QUICK SCREEN W PCR REFLEX
C DIFFICILE (CDIFF) INTERP: NOT DETECTED
C DIFFICLE (CDIFF) ANTIGEN: NEGATIVE
C Diff toxin: NEGATIVE

## 2016-06-22 NOTE — Progress Notes (Addendum)
LCSWA met with patient at bedside. Patient politely declined going to a SNF. Patient reports she is being discharged and her niece is working and cannot transport her home. Patient reports she cannot ride bus home due the excessive walking.  LCSWA spoke with [facility], we cannot provide patient with cab voucher due to fall risk. She will need to transport via [facility].  RNCM aware.      Kathrin Greathouse, Latanya Presser, MSW Clinical Social Worker 5E and Psychiatric Service Line [phone] 06/22/2016  10:44 AM

## 2016-06-22 NOTE — Progress Notes (Signed)
Went over d/c instructions with patient.  She verbalized understanding.  Patient left hospital via EMS with d/c instructions.

## 2016-06-22 NOTE — Progress Notes (Signed)
Date: June 22, 2016 Discharge orders checked for needs. No needs present at time of discharge. [facility] called for nonemergency transport home per patient request. Transport medical necessity form completed and printed out. Velva Harman, RN, BSN, [place]   [phone]

## 2016-06-22 NOTE — Discharge Summary (Signed)
Physician Discharge Summary  [place] F Kauzlarich [id number] DOB: 19-Sep-1946 DOA: 06/21/2016  PCP: Philis Fendt, MD  Admit date: 06/21/2016 Discharge date: 06/22/2016  Time spent:> 35  minutes  Recommendations for Outpatient Follow-up:  1. Please be sure to continue to recommend alcohol cessation   Discharge Diagnoses:  Active Problems:   Hyperlipidemia with target LDL less than 100   TOBACCO DEPENDENCE   PANCREATITIS, CHRONIC    Chronic diarrhea - ? steatorrhea vs. IBS or both   Abdominal pain, chronic, epigastric - from chronic pancreatitis   Alcohol abuse   Hypotension   Discharge Condition: stable  Diet recommendation: Low fat  Filed Weights   06/21/16 1945 06/22/16 0601  Weight: 61.6 kg (135 lb 12.9 oz) 61.6 kg (135 lb 12.9 oz)    History of present illness:  70 y.o.femalewith hx of MI/ CAD w stent, tobacco, chron pancreatitis , hx etoh abuse and HLD, multiple recent admissions for weakness and failure to thrive in the setting of ongoing alcohol abuse, last discharge 06/18/2016, prior to that was hospitalized for 3 days 8/16- 8.18/17 w acute on chron abd pain thought to be related to pancreatitis, pt left AMA. Patient was admitted for observation due to soft blood pressures  Hospital Course:  Hypotension - Resolved and most likely due to dehydration, diarrhea. Dehydration and diarrhea resolved - Blood pressure within normal limits and patient requesting to go home. She refuses physical therapy evaluation prior to discharge. And her preference is to be discharged home and to no facility.  For other known medical conditions listed above will continue home medication regimen listed below  Procedures:  None  Consultations:  None  Discharge Exam: Vitals:   06/21/16 1945 06/22/16 0601  BP:  96/73  Pulse:  97  Resp: 20 20  Temp:  98.1 F (36.7 C)    General: Pt in nad, alert and awake Cardiovascular: rrr, no rubs Respiratory: no increased wob, no  wheezes  Discharge Instructions   Discharge Instructions    Call MD for:  severe uncontrolled pain    Complete by:  As directed   Call MD for:  temperature >100.4    Complete by:  As directed   Diet - low sodium heart healthy    Complete by:  As directed   Discharge instructions    Complete by:  As directed   Please be sure to f/u with your primary care physician in 1-2 weeks or sooner should any new concerns arise. Please abstain from drinking alcohol.   Increase activity slowly    Complete by:  As directed     Current Discharge Medication List    CONTINUE these medications which have NOT CHANGED   Details  aspirin EC 81 MG EC tablet Take 1 tablet (81 mg total) by mouth daily.    atorvastatin (LIPITOR) 40 MG tablet TAKE ONE (1) TABLET EACH DAY AT 6PM Qty: 30 tablet, Refills: 5    clonazePAM (KLONOPIN) 1 MG tablet Take 1 tablet (1 mg total) by mouth at bedtime. Qty: 30 tablet, Refills: 0    clopidogrel (PLAVIX) 75 MG tablet Take 75 mg by mouth daily.      diclofenac sodium (VOLTAREN) 1 % GEL Apply 4 g topically 4 (four) times daily. Qty: 300 g, Refills: 11    mirtazapine (REMERON) 15 MG tablet Take 1 tablet (15 mg total) by mouth at bedtime. Qty: 30 tablet, Refills: 0    nicotine (NICODERM CQ) 14 mg/24hr patch Place 1 patch onto the  skin daily. Qty: 28 patch, Refills: 0    nitroGLYCERIN (NITROSTAT) 0.4 MG SL tablet Place 1 tablet (0.4 mg total) under the tongue every 5 (five) minutes x 3 doses as needed for chest pain. Qty: 25 tablet, Refills: 4    omeprazole (PRILOSEC) 40 MG capsule TAKE ONE CAPSULE BY MOUTH DAILY Qty: 90 capsule, Refills: 1    Pancrelipase, Lip-Prot-Amyl, (CREON) 24000 UNITS CPEP TAKE 3 CAPSULES 3 TIMES A DAY WITH MEALS AND TAKE ONE CAPSULE WITH SNACKS Qty: 180 capsule, Refills: 11    polyethylene glycol (MIRALAX / GLYCOLAX) packet Take 17 g by mouth daily as needed for moderate constipation.    QUEtiapine (SEROQUEL) 50 MG tablet Take 50 mg by mouth  at bedtime.    traMADol (ULTRAM) 50 MG tablet Take 0.5 tablets (25 mg total) by mouth every 6 (six) hours as needed for moderate pain or severe pain. Qty: 20 tablet, Refills: 0    acetaminophen (TYLENOL 8 HOUR) 650 MG CR tablet Take 1 tablet (650 mg total) by mouth every 8 (eight) hours as needed for pain. Qty: 30 tablet, Refills: 0    feeding supplement, ENSURE ENLIVE, (ENSURE ENLIVE) LIQD Take 237 mLs by mouth 3 (three) times daily between meals. Qty: 30 Bottle, Refills: 12    ferrous gluconate (FERGON) 324 MG tablet Take 1 tablet (324 mg total) by mouth daily with breakfast. Qty: 30 tablet, Refills: 0    magnesium gluconate (MAGONATE) 30 MG tablet Take 1 tablet (30 mg total) by mouth 2 (two) times daily. Qty: 60 tablet, Refills: 0    Multiple Vitamins-Minerals (MULTIVITAMIN) tablet Take 1 tablet by mouth daily. Qty: 30 tablet, Refills: 1       Allergies  Allergen Reactions  . Diclofenac Sodium Other (See Comments)    REACTION: Chronic renal insufficiency. Patient can use the gel   . Other Other (See Comments)    Blackeyed peas and [place] Beans...break out in hives      The results of significant diagnostics from this hospitalization (including imaging, microbiology, ancillary and laboratory) are listed below for reference.    Significant Diagnostic Studies: Dg Chest 2 View  Result Date: 06/14/2016 CLINICAL DATA:  Bilateral ankle edema, cough EXAM: CHEST  2 VIEW COMPARISON:  05/16/2016 FINDINGS: There is no focal parenchymal opacity. There is no pleural effusion or pneumothorax. The heart and mediastinal contours are unremarkable. The osseous structures are unremarkable. IMPRESSION: No active cardiopulmonary disease. Electronically Signed   By: Kathreen Devoid   On: 06/14/2016 17:03   Ct Abdomen Pelvis W Contrast  Result Date: 06/08/2016 CLINICAL DATA:  Upper abdominal pain, 3 days duration. Nausea and vomiting today. Constipation. EXAM: CT ABDOMEN AND PELVIS WITH CONTRAST  TECHNIQUE: Multidetector CT imaging of the abdomen and pelvis was performed using the standard protocol following bolus administration of intravenous contrast. CONTRAST:  43mL ISOVUE-300 IOPAMIDOL (ISOVUE-300) INJECTION 61% COMPARISON:  07/18/2012 FINDINGS: Lower chest: Left effusion layering dependently. Atelectasis of the dependent left lung. Coronary artery stent. Hepatobiliary: Profound fatty change of the liver. Previous cholecystectomy. Portal and hepatic veins are patent. Pancreas: Chronic pancreatic calcifications secondary to old pancreatitis. No evidence of active pancreatic process. Spleen: Upper limits of normal in size without focal lesion. Adrenals/Urinary Tract: Adrenal glands are normal. Left kidney is normal. Small cysts at the upper pole of the right kidney. Stomach/Bowel: Question edema of the colon wall that could go along with colitis. No evidence of bowel obstruction. Vascular/Lymphatic: Normal Reproductive: Previous hysterectomy.  No pelvic mass. Other: Small amount  of ascites. Periumbilical ventral hernia containing only fat. Right upper quadrant anterior abdominal wall hernia containing only fat. Musculoskeletal:  Normal IMPRESSION: Profound fatty change of the liver. 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 Probable edema of the wall of the colon consistent with colitis. Small amount of ascites. Small left pleural effusion layering dependently. Periumbilical hernia containing fat. Right upper quadrant anterior abdominal wall hernia containing only fat. Electronically Signed   By: Nelson Chimes M.D.   On: 06/08/2016 13:03   Dg Abd Acute W/chest  Result Date: 06/21/2016 CLINICAL DATA:  Lower extremity edema, hypotensive. History of diabetes and hypertension. EXAM: DG ABDOMEN ACUTE W/ 1V CHEST COMPARISON:  Chest radiograph June 14, 2016 and CT abdomen and pelvis June 08, 2016 FINDINGS: Cardiomediastinal silhouette is normal. Coronary artery stent. LEFT lung  base atelectasis/scarring. Lungs are otherwise clear, no pleural effusions. No pneumothorax. Soft tissue planes and included osseous structures are unremarkable. Bowel gas pattern is nondilated. Scattered nondistended small bowel air-fluid level is predominately in the pelvis. Paucity of large bowel gas. No intra-abdominal mass effect, pathologic calcifications or free air. Soft tissue planes and included osseous structures are non-suspicious. Coarse pancreatic calcifications are seen on prior CT. Surgical clips in the included right abdomen compatible with cholecystectomy. Surgical clips in RIGHT inguinal soft tissues compatible with prior vascular access. IMPRESSION: No acute cardiopulmonary process. Nondistended small bowel with air-fluid levels suggesting enteritis though, partial or early small bowel obstruction may have this appearance. Electronically Signed   By: Elon Alas M.D.   On: 06/21/2016 16:43   US Abdomen Limited Ruq  Result Date: 06/08/2016 CLINICAL DATA:  Three day history of right upper quadrant pain with nausea vomiting today. EXAM: US ABDOMEN LIMITED - RIGHT UPPER QUADRANT COMPARISON:  CT scan 06/08/2016 FINDINGS: Gallbladder: Surgically absent. Common bile duct: Diameter: 10 mm. Liver: Coarsening of the echotexture suggests steatosis. IMPRESSION: 1. Status postcholecystectomy. 2. Mild distention of the extrahepatic common duct for patient age. This may be related to prior cholecystectomy. Correlation with liver function test may prove helpful. Electronically Signed   By: Misty Stanley M.D.   On: 06/08/2016 18:53    Microbiology: Recent Results (from the past 240 hour(s))  Gastrointestinal Panel by PCR , Stool     Status: None   Collection Time: 06/22/16  5:58 AM  Result Value Ref Range Status   Campylobacter species NOT DETECTED NOT DETECTED Final   Plesimonas shigelloides NOT DETECTED NOT DETECTED Final   Salmonella species NOT DETECTED NOT DETECTED Final   Yersinia  enterocolitica NOT DETECTED NOT DETECTED Final   Vibrio species NOT DETECTED NOT DETECTED Final   Vibrio cholerae NOT DETECTED NOT DETECTED Final   Enteroaggregative E coli (EAEC) NOT DETECTED NOT DETECTED Final   Enteropathogenic E coli (EPEC) NOT DETECTED NOT DETECTED Final   Enterotoxigenic E coli (ETEC) NOT DETECTED NOT DETECTED Final   Shiga like toxin producing E coli (STEC) NOT DETECTED NOT DETECTED Final   E. coli O157 NOT DETECTED NOT DETECTED Final   Shigella/Enteroinvasive E coli (EIEC) NOT DETECTED NOT DETECTED Final   Cryptosporidium NOT DETECTED NOT DETECTED Final   Cyclospora cayetanensis NOT DETECTED NOT DETECTED Final   Entamoeba histolytica NOT DETECTED NOT DETECTED Final   Giardia lamblia NOT DETECTED NOT DETECTED Final   Adenovirus F40/41 NOT DETECTED NOT DETECTED Final   Astrovirus NOT DETECTED NOT DETECTED Final   Norovirus GI/GII NOT DETECTED NOT DETECTED Final   Rotavirus A NOT DETECTED NOT DETECTED Final   Sapovirus (I,  II, IV, and V) NOT DETECTED NOT DETECTED Final  C difficile quick scan w PCR reflex     Status: None   Collection Time: 06/22/16  5:58 AM  Result Value Ref Range Status   C Diff antigen NEGATIVE NEGATIVE Final   C Diff toxin NEGATIVE NEGATIVE Final   C Diff interpretation No C. difficile detected.  Final     Labs: Basic Metabolic Panel:  Recent Labs Lab 06/16/16 0554 06/21/16 1522 06/22/16 0700  NA 140 139 141  K 3.9 4.1 3.8  CL 115* 109 113*  CO2 22 21* 21*  GLUCOSE 98 95 88  BUN 6 7 <5*  CREATININE 0.84 0.77 0.73  CALCIUM 7.0* 7.7* 7.4*  MG 1.1*  --   --   PHOS 2.1*  --   --    Liver Function Tests:  Recent Labs Lab 06/16/16 0554 06/21/16 1522 06/22/16 0700  AST 78* 82* 75*  ALT 26 29 25   ALKPHOS 133* 164* 143*  BILITOT 3.9* 5.1* 4.5*  PROT 5.2* 7.0 6.1*  ALBUMIN 1.5* 2.0* 1.7*    Recent Labs Lab 06/21/16 1522  LIPASE 11   No results for input(s): AMMONIA in the last 168 hours. CBC:  Recent Labs Lab  06/16/16 0554 06/21/16 1522 06/22/16 0700  WBC 3.7* 5.9 4.4  NEUTROABS  --  3.6  --   HGB 8.1* 10.4* 9.0*  HCT 22.4* 30.3* 26.0*  MCV 69.1* 70.3* 69.9*  PLT 100* 144* 86*   Cardiac Enzymes: No results for input(s): CKTOTAL, CKMB, CKMBINDEX, TROPONINI in the last 168 hours. BNP: BNP (last 3 results)  Recent Labs  06/21/16 1522  BNP 57.7    ProBNP (last 3 results) No results for input(s): PROBNP in the last 8760 hours.  CBG: No results for input(s): GLUCAP in the last 168 hours.     Signed:  Velvet Bathe MD.  [facility] 06/22/2016, 1:35 PM

## 2016-06-22 NOTE — Progress Notes (Signed)
PT Cancellation Note  Patient Details Name: [name] Foley MRN: [id number] DOB: 1946/07/19   Cancelled Treatment:    Reason Eval/Treat Not Completed: Patient declined, no reason specifiedper RN, refused.   [name] 06/22/2016, 1:05 PM

## 2016-06-22 NOTE — Progress Notes (Signed)
Per patient.  She does not want to be seen by physical therapy.

## 2016-06-24 NOTE — Consult Note (Signed)
   [facility] Inpatient Consult   06/24/2016  [place] F Gunia 09-Apr-1946 [id number]   [facility] Care Management referral received. Chart reviewed. Patient's Primary Care MD is Dr. Jeanie Cooks. Dr. Jeanie Cooks is not a [facility] Provider. Therefore, Ms. Hughes is not eligible for [facility] Care Management services at this time.  Marthenia Rolling, MSN-Ed, RN,BSN [facility] 2 Liaison [phone]

## 2016-06-30 ENCOUNTER — Inpatient Hospital Stay (HOSPITAL_COMMUNITY)
Admission: EM | Admit: 2016-06-30 | Discharge: 2016-07-04 | DRG: 442 | Disposition: A | Discharge status: Other Acute Inpatient Hospital | Payer: Medicare Other | Attending: Internal Medicine | Admitting: Internal Medicine

## 2016-06-30 DIAGNOSIS — R1084 Generalized abdominal pain: Secondary | ICD-10-CM | POA: Diagnosis present

## 2016-06-30 DIAGNOSIS — E785 Hyperlipidemia, unspecified: Secondary | ICD-10-CM | POA: Diagnosis present

## 2016-06-30 DIAGNOSIS — Z955 Presence of coronary angioplasty implant and graft: Secondary | ICD-10-CM

## 2016-06-30 DIAGNOSIS — E871 Hypo-osmolality and hyponatremia: Secondary | ICD-10-CM | POA: Diagnosis present

## 2016-06-30 DIAGNOSIS — R945 Abnormal results of liver function studies: Secondary | ICD-10-CM | POA: Diagnosis present

## 2016-06-30 DIAGNOSIS — E876 Hypokalemia: Secondary | ICD-10-CM | POA: Diagnosis present

## 2016-06-30 DIAGNOSIS — K652 Spontaneous bacterial peritonitis: Secondary | ICD-10-CM

## 2016-06-30 DIAGNOSIS — K7460 Unspecified cirrhosis of liver: Secondary | ICD-10-CM | POA: Diagnosis present

## 2016-06-30 DIAGNOSIS — Z7902 Long term (current) use of antithrombotics/antiplatelets: Secondary | ICD-10-CM

## 2016-06-30 DIAGNOSIS — Z7982 Long term (current) use of aspirin: Secondary | ICD-10-CM

## 2016-06-30 DIAGNOSIS — K219 Gastro-esophageal reflux disease without esophagitis: Secondary | ICD-10-CM | POA: Diagnosis present

## 2016-06-30 DIAGNOSIS — I959 Hypotension, unspecified: Secondary | ICD-10-CM | POA: Diagnosis present

## 2016-06-30 DIAGNOSIS — Z79899 Other long term (current) drug therapy: Secondary | ICD-10-CM

## 2016-06-30 DIAGNOSIS — K861 Other chronic pancreatitis: Secondary | ICD-10-CM | POA: Diagnosis present

## 2016-06-30 DIAGNOSIS — M7989 Other specified soft tissue disorders: Secondary | ICD-10-CM | POA: Diagnosis present

## 2016-06-30 DIAGNOSIS — I252 Old myocardial infarction: Secondary | ICD-10-CM

## 2016-06-30 DIAGNOSIS — K7290 Hepatic failure, unspecified without coma: Principal | ICD-10-CM | POA: Diagnosis present

## 2016-06-30 DIAGNOSIS — F419 Anxiety disorder, unspecified: Secondary | ICD-10-CM | POA: Diagnosis present

## 2016-06-30 DIAGNOSIS — I9589 Other hypotension: Secondary | ICD-10-CM

## 2016-06-30 DIAGNOSIS — E860 Dehydration: Secondary | ICD-10-CM | POA: Diagnosis present

## 2016-06-30 DIAGNOSIS — R600 Localized edema: Secondary | ICD-10-CM

## 2016-06-30 DIAGNOSIS — N179 Acute kidney failure, unspecified: Secondary | ICD-10-CM | POA: Diagnosis present

## 2016-06-30 DIAGNOSIS — R109 Unspecified abdominal pain: Secondary | ICD-10-CM | POA: Diagnosis not present

## 2016-06-30 DIAGNOSIS — N390 Urinary tract infection, site not specified: Secondary | ICD-10-CM | POA: Diagnosis present

## 2016-06-30 DIAGNOSIS — E872 Acidosis: Secondary | ICD-10-CM | POA: Diagnosis present

## 2016-06-30 DIAGNOSIS — F1010 Alcohol abuse, uncomplicated: Secondary | ICD-10-CM | POA: Diagnosis present

## 2016-06-30 DIAGNOSIS — K7040 Alcoholic hepatic failure without coma: Secondary | ICD-10-CM

## 2016-06-30 DIAGNOSIS — I2510 Atherosclerotic heart disease of native coronary artery without angina pectoris: Secondary | ICD-10-CM | POA: Diagnosis present

## 2016-06-30 DIAGNOSIS — Z87891 Personal history of nicotine dependence: Secondary | ICD-10-CM

## 2016-06-30 DIAGNOSIS — R7989 Other specified abnormal findings of blood chemistry: Secondary | ICD-10-CM | POA: Diagnosis present

## 2016-06-30 DIAGNOSIS — F319 Bipolar disorder, unspecified: Secondary | ICD-10-CM | POA: Diagnosis present

## 2016-06-30 DIAGNOSIS — D509 Iron deficiency anemia, unspecified: Secondary | ICD-10-CM | POA: Diagnosis present

## 2016-06-30 DIAGNOSIS — K529 Noninfective gastroenteritis and colitis, unspecified: Secondary | ICD-10-CM | POA: Diagnosis present

## 2016-06-30 DIAGNOSIS — R1010 Upper abdominal pain, unspecified: Secondary | ICD-10-CM

## 2016-06-30 DIAGNOSIS — R188 Other ascites: Secondary | ICD-10-CM

## 2016-06-30 DIAGNOSIS — E1151 Type 2 diabetes mellitus with diabetic peripheral angiopathy without gangrene: Secondary | ICD-10-CM | POA: Diagnosis present

## 2016-06-30 DIAGNOSIS — I10 Essential (primary) hypertension: Secondary | ICD-10-CM | POA: Diagnosis present

## 2016-06-30 LAB — CBC WITH DIFFERENTIAL/PLATELET
BASOS PCT: 0 %
Basophils Absolute: 0 10*3/uL (ref 0.0–0.1)
EOS ABS: 0.1 10*3/uL (ref 0.0–0.7)
Eosinophils Relative: 1 %
HCT: 29.6 % — ABNORMAL LOW (ref 36.0–46.0)
Hemoglobin: 10.1 g/dL — ABNORMAL LOW (ref 12.0–15.0)
LYMPHS ABS: 2.5 10*3/uL (ref 0.7–4.0)
Lymphocytes Relative: 28 %
MCH: 23.4 pg — AB (ref 26.0–34.0)
MCHC: 34.1 g/dL (ref 30.0–36.0)
MCV: 68.7 fL — ABNORMAL LOW (ref 78.0–100.0)
MONO ABS: 1.1 10*3/uL — AB (ref 0.1–1.0)
Monocytes Relative: 12 %
NEUTROS PCT: 59 %
Neutro Abs: 5.4 10*3/uL (ref 1.7–7.7)
PLATELETS: 265 10*3/uL (ref 150–400)
RBC: 4.31 MIL/uL (ref 3.87–5.11)
RDW: 20.9 % — AB (ref 11.5–15.5)
WBC: 9.1 10*3/uL (ref 4.0–10.5)

## 2016-06-30 LAB — COMPREHENSIVE METABOLIC PANEL
ALK PHOS: 162 U/L — AB (ref 38–126)
ALT: 31 U/L (ref 14–54)
AST: 88 U/L — ABNORMAL HIGH (ref 15–41)
Albumin: 1.8 g/dL — ABNORMAL LOW (ref 3.5–5.0)
Anion gap: 11 (ref 5–15)
BUN: 11 mg/dL (ref 6–20)
CALCIUM: 7.6 mg/dL — AB (ref 8.9–10.3)
CO2: 15 mmol/L — AB (ref 22–32)
CREATININE: 1.35 mg/dL — AB (ref 0.44–1.00)
Chloride: 105 mmol/L (ref 101–111)
GFR, EST AFRICAN AMERICAN: 46 mL/min — AB (ref >60)
GFR, EST NON AFRICAN AMERICAN: 39 mL/min — AB (ref >60)
Glucose, Bld: 129 mg/dL — ABNORMAL HIGH (ref 65–99)
Potassium: 4.4 mmol/L (ref 3.5–5.1)
SODIUM: 131 mmol/L — AB (ref 135–145)
Total Bilirubin: 5.5 mg/dL — ABNORMAL HIGH (ref 0.3–1.2)
Total Protein: 7.3 g/dL (ref 6.5–8.1)

## 2016-06-30 LAB — LIPASE, BLOOD: Lipase: 11 U/L (ref 11–51)

## 2016-06-30 MED ORDER — FENTANYL CITRATE (PF) 100 MCG/2ML IJ SOLN
50.0000 ug | Freq: Once | INTRAMUSCULAR | Status: AC
  Administered 2016-06-30: 50 ug via INTRAVENOUS
  Filled 2016-06-30: qty 2

## 2016-06-30 NOTE — ED Notes (Signed)
Attempted to place PIV in pt's right ACF and left hand without success.  Consult placed for IV team at this time.

## 2016-06-30 NOTE — ED Triage Notes (Signed)
Pt reports hx of pancreatitis. Pt reports abdominal pain, pt also reports pedal and ankle edema. Pt was recently seen for this and admitting to hospital 08/29.

## 2016-07-01 ENCOUNTER — Encounter (HOSPITAL_COMMUNITY): Payer: Self-pay | Admitting: Internal Medicine

## 2016-07-01 ENCOUNTER — Inpatient Hospital Stay (HOSPITAL_COMMUNITY): Payer: Medicare Other

## 2016-07-01 DIAGNOSIS — I252 Old myocardial infarction: Secondary | ICD-10-CM | POA: Diagnosis not present

## 2016-07-01 DIAGNOSIS — D509 Iron deficiency anemia, unspecified: Secondary | ICD-10-CM | POA: Diagnosis present

## 2016-07-01 DIAGNOSIS — M7989 Other specified soft tissue disorders: Secondary | ICD-10-CM | POA: Diagnosis present

## 2016-07-01 DIAGNOSIS — Z955 Presence of coronary angioplasty implant and graft: Secondary | ICD-10-CM | POA: Diagnosis not present

## 2016-07-01 DIAGNOSIS — Z7982 Long term (current) use of aspirin: Secondary | ICD-10-CM | POA: Diagnosis not present

## 2016-07-01 DIAGNOSIS — F1010 Alcohol abuse, uncomplicated: Secondary | ICD-10-CM | POA: Diagnosis present

## 2016-07-01 DIAGNOSIS — K529 Noninfective gastroenteritis and colitis, unspecified: Secondary | ICD-10-CM

## 2016-07-01 DIAGNOSIS — E871 Hypo-osmolality and hyponatremia: Secondary | ICD-10-CM | POA: Diagnosis present

## 2016-07-01 DIAGNOSIS — K7290 Hepatic failure, unspecified without coma: Secondary | ICD-10-CM | POA: Diagnosis present

## 2016-07-01 DIAGNOSIS — E860 Dehydration: Secondary | ICD-10-CM | POA: Diagnosis present

## 2016-07-01 DIAGNOSIS — Z7902 Long term (current) use of antithrombotics/antiplatelets: Secondary | ICD-10-CM | POA: Diagnosis not present

## 2016-07-01 DIAGNOSIS — R600 Localized edema: Secondary | ICD-10-CM | POA: Insufficient documentation

## 2016-07-01 DIAGNOSIS — Z79899 Other long term (current) drug therapy: Secondary | ICD-10-CM | POA: Diagnosis not present

## 2016-07-01 DIAGNOSIS — N390 Urinary tract infection, site not specified: Secondary | ICD-10-CM | POA: Diagnosis present

## 2016-07-01 DIAGNOSIS — R945 Abnormal results of liver function studies: Secondary | ICD-10-CM | POA: Diagnosis present

## 2016-07-01 DIAGNOSIS — Z87891 Personal history of nicotine dependence: Secondary | ICD-10-CM | POA: Diagnosis not present

## 2016-07-01 DIAGNOSIS — N179 Acute kidney failure, unspecified: Secondary | ICD-10-CM | POA: Diagnosis present

## 2016-07-01 DIAGNOSIS — E876 Hypokalemia: Secondary | ICD-10-CM | POA: Diagnosis present

## 2016-07-01 DIAGNOSIS — R1084 Generalized abdominal pain: Secondary | ICD-10-CM | POA: Diagnosis not present

## 2016-07-01 DIAGNOSIS — E785 Hyperlipidemia, unspecified: Secondary | ICD-10-CM | POA: Diagnosis present

## 2016-07-01 DIAGNOSIS — K219 Gastro-esophageal reflux disease without esophagitis: Secondary | ICD-10-CM | POA: Diagnosis present

## 2016-07-01 DIAGNOSIS — I959 Hypotension, unspecified: Secondary | ICD-10-CM

## 2016-07-01 DIAGNOSIS — F319 Bipolar disorder, unspecified: Secondary | ICD-10-CM | POA: Diagnosis present

## 2016-07-01 DIAGNOSIS — I10 Essential (primary) hypertension: Secondary | ICD-10-CM | POA: Diagnosis present

## 2016-07-01 DIAGNOSIS — R109 Unspecified abdominal pain: Secondary | ICD-10-CM | POA: Diagnosis present

## 2016-07-01 DIAGNOSIS — K861 Other chronic pancreatitis: Secondary | ICD-10-CM | POA: Diagnosis present

## 2016-07-01 DIAGNOSIS — R188 Other ascites: Secondary | ICD-10-CM | POA: Diagnosis present

## 2016-07-01 DIAGNOSIS — R7989 Other specified abnormal findings of blood chemistry: Secondary | ICD-10-CM | POA: Diagnosis not present

## 2016-07-01 DIAGNOSIS — E1151 Type 2 diabetes mellitus with diabetic peripheral angiopathy without gangrene: Secondary | ICD-10-CM | POA: Diagnosis present

## 2016-07-01 DIAGNOSIS — I2510 Atherosclerotic heart disease of native coronary artery without angina pectoris: Secondary | ICD-10-CM | POA: Diagnosis present

## 2016-07-01 DIAGNOSIS — E872 Acidosis: Secondary | ICD-10-CM | POA: Diagnosis present

## 2016-07-01 LAB — GASTROINTESTINAL PANEL BY PCR, STOOL (REPLACES STOOL CULTURE)

## 2016-07-01 LAB — URINALYSIS, ROUTINE W REFLEX MICROSCOPIC
Glucose, UA: NEGATIVE mg/dL
Hgb urine dipstick: NEGATIVE
KETONES UR: NEGATIVE mg/dL
NITRITE: NEGATIVE
PH: 5.5 (ref 5.0–8.0)
PROTEIN: NEGATIVE mg/dL
Specific Gravity, Urine: 1.016 (ref 1.005–1.030)

## 2016-07-01 LAB — COMPREHENSIVE METABOLIC PANEL
ALK PHOS: 117 U/L (ref 38–126)
ALT: 23 U/L (ref 14–54)
AST: 58 U/L — ABNORMAL HIGH (ref 15–41)
Albumin: 2.2 g/dL — ABNORMAL LOW (ref 3.5–5.0)
Anion gap: 10 (ref 5–15)
BILIRUBIN TOTAL: 4.3 mg/dL — AB (ref 0.3–1.2)
BUN: 8 mg/dL (ref 6–20)
CALCIUM: 7.1 mg/dL — AB (ref 8.9–10.3)
CO2: 17 mmol/L — AB (ref 22–32)
CREATININE: 1.05 mg/dL — AB (ref 0.44–1.00)
Chloride: 108 mmol/L (ref 101–111)
GFR, EST NON AFRICAN AMERICAN: 53 mL/min — AB (ref >60)
Glucose, Bld: 79 mg/dL (ref 65–99)
Potassium: 2.8 mmol/L — ABNORMAL LOW (ref 3.5–5.1)
Sodium: 135 mmol/L (ref 135–145)
TOTAL PROTEIN: 5.8 g/dL — AB (ref 6.5–8.1)

## 2016-07-01 LAB — GRAM STAIN

## 2016-07-01 LAB — BODY FLUID CELL COUNT WITH DIFFERENTIAL
Eos, Fluid: 0 %
LYMPHS FL: 50 %
MONOCYTE-MACROPHAGE-SEROUS FLUID: 33 % — AB (ref 50–90)
NEUTROPHIL FLUID: 17 % (ref 0–25)
WBC FLUID: 177 uL (ref 0–1000)

## 2016-07-01 LAB — URINE MICROSCOPIC-ADD ON

## 2016-07-01 LAB — IRON AND TIBC: IRON: 38 ug/dL (ref 28–170)

## 2016-07-01 LAB — ALBUMIN, FLUID (OTHER): Albumin, Fluid: <1 g/dL

## 2016-07-01 LAB — CBC
HCT: 21.7 % — ABNORMAL LOW (ref 36.0–46.0)
Hemoglobin: 7.4 g/dL — ABNORMAL LOW (ref 12.0–15.0)
MCH: 23.2 pg — AB (ref 26.0–34.0)
MCHC: 34.1 g/dL (ref 30.0–36.0)
MCV: 68 fL — ABNORMAL LOW (ref 78.0–100.0)
PLATELETS: 150 10*3/uL (ref 150–400)
RBC: 3.19 MIL/uL — AB (ref 3.87–5.11)
RDW: 20.6 % — ABNORMAL HIGH (ref 11.5–15.5)
WBC: 5.4 10*3/uL (ref 4.0–10.5)

## 2016-07-01 LAB — C DIFFICILE QUICK SCREEN W PCR REFLEX
C DIFFICILE (CDIFF) TOXIN: NEGATIVE
C DIFFICLE (CDIFF) ANTIGEN: NEGATIVE
C Diff interpretation: NOT DETECTED

## 2016-07-01 LAB — MRSA PCR SCREENING: MRSA by PCR: NEGATIVE

## 2016-07-01 LAB — LACTIC ACID, PLASMA
LACTIC ACID, VENOUS: 2.8 mmol/L — AB (ref 0.5–1.9)
Lactic Acid, Venous: 3 mmol/L (ref 0.5–1.9)

## 2016-07-01 LAB — I-STAT CG4 LACTIC ACID, ED: LACTIC ACID, VENOUS: 3.64 mmol/L — AB (ref 0.5–1.9)

## 2016-07-01 LAB — POTASSIUM: Potassium: 4.4 mmol/L (ref 3.5–5.1)

## 2016-07-01 LAB — FERRITIN: Ferritin: 450 ng/mL — ABNORMAL HIGH (ref 11–307)

## 2016-07-01 LAB — RETICULOCYTES
RBC.: 3.24 MIL/uL — ABNORMAL LOW (ref 3.87–5.11)
RETIC COUNT ABSOLUTE: 22.7 10*3/uL (ref 19.0–186.0)
Retic Ct Pct: 0.7 % (ref 0.4–3.1)

## 2016-07-01 LAB — PROTEIN, BODY FLUID: Total protein, fluid: <3 g/dL

## 2016-07-01 LAB — PROTIME-INR
INR: 3.31
Prothrombin Time: 34.4 seconds — ABNORMAL HIGH (ref 11.4–15.2)

## 2016-07-01 LAB — MAGNESIUM: Magnesium: 1.2 mg/dL — ABNORMAL LOW (ref 1.7–2.4)

## 2016-07-01 LAB — BRAIN NATRIURETIC PEPTIDE: B NATRIURETIC PEPTIDE 5: 46.4 pg/mL (ref 0.0–100.0)

## 2016-07-01 LAB — GLUCOSE, CAPILLARY: GLUCOSE-CAPILLARY: 75 mg/dL (ref 65–99)

## 2016-07-01 LAB — APTT: APTT: 42 s — AB (ref 24–36)

## 2016-07-01 LAB — FOLATE: FOLATE: 12.6 ng/mL (ref >5.9)

## 2016-07-01 LAB — ABO/RH: ABO/RH(D): A POS

## 2016-07-01 LAB — VITAMIN B12: Vitamin B-12: 1122 pg/mL — ABNORMAL HIGH (ref 180–914)

## 2016-07-01 LAB — PROCALCITONIN: PROCALCITONIN: 0.43 ng/mL

## 2016-07-01 MED ORDER — NITROGLYCERIN 0.4 MG SL SUBL: 0.4000 mg | SUBLINGUAL_TABLET | SUBLINGUAL | Status: DC | PRN

## 2016-07-01 MED ORDER — ONDANSETRON HCL 4 MG/2ML IJ SOLN: 4.0000 mg | Freq: Four times a day (QID) | INTRAMUSCULAR | Status: DC | PRN

## 2016-07-01 MED ORDER — VITAMIN B-1 100 MG PO TABS
100.0000 mg | ORAL_TABLET | Freq: Every day | ORAL | Status: DC
  Administered 2016-07-01 – 2016-07-04 (×4): 100 mg via ORAL
  Filled 2016-07-01 (×4): qty 1

## 2016-07-01 MED ORDER — DEXTROSE 5 % IV SOLN
2.0000 g | Freq: Once | INTRAVENOUS | Status: AC
  Administered 2016-07-01: 2 g via INTRAVENOUS
  Filled 2016-07-01: qty 2

## 2016-07-01 MED ORDER — POTASSIUM CHLORIDE 10 MEQ/100ML IV SOLN
10.0000 meq | INTRAVENOUS | Status: AC
  Administered 2016-07-01 (×2): 10 meq via INTRAVENOUS
  Filled 2016-07-01: qty 100

## 2016-07-01 MED ORDER — ALBUMIN HUMAN 5 % IV SOLN
75.0000 g | Freq: Once | INTRAVENOUS | Status: AC
  Administered 2016-07-01: 75 g via INTRAVENOUS
  Filled 2016-07-01: qty 1500

## 2016-07-01 MED ORDER — ONDANSETRON HCL 4 MG PO TABS: 4.0000 mg | ORAL_TABLET | Freq: Four times a day (QID) | ORAL | Status: DC | PRN

## 2016-07-01 MED ORDER — ASPIRIN EC 81 MG PO TBEC
81.0000 mg | DELAYED_RELEASE_TABLET | Freq: Every day | ORAL | Status: DC
  Administered 2016-07-01 – 2016-07-04 (×4): 81 mg via ORAL
  Filled 2016-07-01 (×4): qty 1

## 2016-07-01 MED ORDER — POTASSIUM CHLORIDE 20 MEQ/15ML (10%) PO SOLN: 40.0000 meq | Freq: Once | ORAL | Status: DC

## 2016-07-01 MED ORDER — LOPERAMIDE HCL 2 MG PO CAPS
2.0000 mg | ORAL_CAPSULE | ORAL | Status: DC | PRN
  Administered 2016-07-02: 2 mg via ORAL
  Filled 2016-07-01: qty 1

## 2016-07-01 MED ORDER — MIRTAZAPINE 15 MG PO TABS
15.0000 mg | ORAL_TABLET | Freq: Every day | ORAL | Status: DC
  Administered 2016-07-01 – 2016-07-03 (×3): 15 mg via ORAL
  Filled 2016-07-01 (×3): qty 1

## 2016-07-01 MED ORDER — ALBUMIN HUMAN 5 % IV SOLN
25.0000 g | Freq: Once | INTRAVENOUS | Status: AC
Start: 2016-07-01 — End: 2016-07-01
  Administered 2016-07-01: 25 g via INTRAVENOUS
  Filled 2016-07-01: qty 500

## 2016-07-01 MED ORDER — DEXTROSE 50 % IV SOLN
INTRAVENOUS | Status: AC
  Administered 2016-07-01: 50 mL
  Filled 2016-07-01: qty 50

## 2016-07-01 MED ORDER — THIAMINE HCL 100 MG/ML IJ SOLN: 100.0000 mg | Freq: Every day | INTRAMUSCULAR | Status: DC

## 2016-07-01 MED ORDER — MORPHINE SULFATE (PF) 2 MG/ML IV SOLN: 1.0000 mg | INTRAVENOUS | Status: DC | PRN

## 2016-07-01 MED ORDER — ALBUMIN HUMAN 25 % IV SOLN: 50.0000 g | Freq: Once | INTRAVENOUS | Status: DC | PRN

## 2016-07-01 MED ORDER — THERA VITAL M PO TABS: 1.0000 | ORAL_TABLET | Freq: Every day | ORAL | Status: DC

## 2016-07-01 MED ORDER — PANCRELIPASE (LIP-PROT-AMYL) 12000-38000 UNITS PO CPEP: 24000.0000 [IU] | ORAL_CAPSULE | ORAL | Status: DC | PRN

## 2016-07-01 MED ORDER — FOLIC ACID 1 MG PO TABS
1.0000 mg | ORAL_TABLET | Freq: Every day | ORAL | Status: DC
  Administered 2016-07-01 – 2016-07-04 (×4): 1 mg via ORAL
  Filled 2016-07-01 (×4): qty 1

## 2016-07-01 MED ORDER — SODIUM CHLORIDE 0.9 % IV BOLUS (SEPSIS)
500.0000 mL | Freq: Once | INTRAVENOUS | Status: AC
  Administered 2016-07-01: 500 mL via INTRAVENOUS

## 2016-07-01 MED ORDER — DEXTROSE 5 % IV SOLN
2.0000 g | INTRAVENOUS | Status: DC
Start: 2016-07-02 — End: 2016-07-04
  Administered 2016-07-01 – 2016-07-04 (×3): 2 g via INTRAVENOUS
  Filled 2016-07-01 (×3): qty 2

## 2016-07-01 MED ORDER — LIDOCAINE HCL (PF) 1 % IJ SOLN
INTRAMUSCULAR | Status: AC
  Filled 2016-07-01: qty 10

## 2016-07-01 MED ORDER — LORAZEPAM 2 MG/ML IJ SOLN: 0.0000 mg | Freq: Two times a day (BID) | INTRAMUSCULAR | Status: DC

## 2016-07-01 MED ORDER — POTASSIUM CHLORIDE 10 MEQ/100ML IV SOLN
10.0000 meq | INTRAVENOUS | Status: AC
  Administered 2016-07-01 (×4): 10 meq via INTRAVENOUS
  Filled 2016-07-01 (×6): qty 100

## 2016-07-01 MED ORDER — ADULT MULTIVITAMIN W/MINERALS CH
1.0000 | ORAL_TABLET | Freq: Every day | ORAL | Status: DC
  Administered 2016-07-01 – 2016-07-04 (×4): 1 via ORAL
  Filled 2016-07-01 (×4): qty 1

## 2016-07-01 MED ORDER — FERROUS GLUCONATE 324 (38 FE) MG PO TABS
324.0000 mg | ORAL_TABLET | Freq: Every day | ORAL | Status: DC
  Administered 2016-07-02 – 2016-07-04 (×3): 324 mg via ORAL
  Filled 2016-07-01 (×4): qty 1

## 2016-07-01 MED ORDER — TRAMADOL HCL 50 MG PO TABS: 25.0000 mg | ORAL_TABLET | Freq: Four times a day (QID) | ORAL | Status: DC | PRN

## 2016-07-01 MED ORDER — LORAZEPAM 2 MG/ML IJ SOLN: 0.0000 mg | Freq: Four times a day (QID) | INTRAMUSCULAR | Status: AC

## 2016-07-01 MED ORDER — CLOPIDOGREL BISULFATE 75 MG PO TABS
75.0000 mg | ORAL_TABLET | Freq: Every evening | ORAL | Status: DC
  Administered 2016-07-01 – 2016-07-03 (×3): 75 mg via ORAL
  Filled 2016-07-01 (×3): qty 1

## 2016-07-01 MED ORDER — MAGNESIUM GLUCONATE 500 MG PO TABS
500.0000 mg | ORAL_TABLET | Freq: Every day | ORAL | Status: DC
  Administered 2016-07-01: 500 mg via ORAL
  Filled 2016-07-01 (×2): qty 1

## 2016-07-01 MED ORDER — PANTOPRAZOLE SODIUM 40 MG PO TBEC
40.0000 mg | DELAYED_RELEASE_TABLET | Freq: Every day | ORAL | Status: DC
  Administered 2016-07-01 – 2016-07-04 (×4): 40 mg via ORAL
  Filled 2016-07-01 (×4): qty 1

## 2016-07-01 MED ORDER — CLONAZEPAM 0.5 MG PO TABS
1.0000 mg | ORAL_TABLET | Freq: Every day | ORAL | Status: DC
  Administered 2016-07-01 – 2016-07-03 (×3): 1 mg via ORAL
  Filled 2016-07-01: qty 2
  Filled 2016-07-01: qty 1
  Filled 2016-07-01: qty 2

## 2016-07-01 MED ORDER — SODIUM CHLORIDE 0.9 % IV SOLN
INTRAVENOUS | Status: DC
  Administered 2016-07-01 (×3): via INTRAVENOUS

## 2016-07-01 MED ORDER — LORAZEPAM 2 MG/ML IJ SOLN: 1.0000 mg | Freq: Four times a day (QID) | INTRAMUSCULAR | Status: AC | PRN

## 2016-07-01 MED ORDER — SODIUM CHLORIDE 0.9 % IV BOLUS (SEPSIS)
1000.0000 mL | Freq: Once | INTRAVENOUS | Status: AC
  Administered 2016-07-01: 1000 mL via INTRAVENOUS

## 2016-07-01 MED ORDER — SODIUM CHLORIDE 0.9% FLUSH
3.0000 mL | Freq: Two times a day (BID) | INTRAVENOUS | Status: DC
  Administered 2016-07-01 – 2016-07-03 (×7): 3 mL via INTRAVENOUS

## 2016-07-01 MED ORDER — IOPAMIDOL (ISOVUE-300) INJECTION 61%
15.0000 mL | INTRAVENOUS | Status: AC
  Administered 2016-07-01 (×2): 15 mL via ORAL

## 2016-07-01 MED ORDER — POTASSIUM CHLORIDE CRYS ER 20 MEQ PO TBCR
40.0000 meq | EXTENDED_RELEASE_TABLET | Freq: Once | ORAL | Status: AC
  Administered 2016-07-01: 20 meq via ORAL
  Filled 2016-07-01: qty 2

## 2016-07-01 MED ORDER — IOPAMIDOL (ISOVUE-300) INJECTION 61%
INTRAVENOUS | Status: AC
  Administered 2016-07-01: 100 mL
  Filled 2016-07-01: qty 100

## 2016-07-01 MED ORDER — LORAZEPAM 1 MG PO TABS
1.0000 mg | ORAL_TABLET | Freq: Four times a day (QID) | ORAL | Status: AC | PRN
  Administered 2016-07-01 – 2016-07-02 (×2): 1 mg via ORAL
  Filled 2016-07-01 (×2): qty 1

## 2016-07-01 MED ORDER — PANCRELIPASE (LIP-PROT-AMYL) 12000-38000 UNITS PO CPEP
72000.0000 [IU] | ORAL_CAPSULE | Freq: Three times a day (TID) | ORAL | Status: DC
  Administered 2016-07-01 – 2016-07-04 (×5): 72000 [IU] via ORAL
  Filled 2016-07-01 (×6): qty 6

## 2016-07-01 MED ORDER — QUETIAPINE FUMARATE 25 MG PO TABS
50.0000 mg | ORAL_TABLET | Freq: Every day | ORAL | Status: DC
  Administered 2016-07-01 – 2016-07-03 (×3): 50 mg via ORAL
  Filled 2016-07-01 (×2): qty 2
  Filled 2016-07-01: qty 1

## 2016-07-01 MED ORDER — SODIUM CHLORIDE 0.9 % IV SOLN
Freq: Once | INTRAVENOUS | Status: AC
  Administered 2016-07-01: 01:00:00 via INTRAVENOUS

## 2016-07-01 NOTE — ED Notes (Signed)
[place] at the bedside at this time to perform pt's [place].

## 2016-07-01 NOTE — ED Notes (Signed)
Attempted to call report at this time 

## 2016-07-01 NOTE — ED Notes (Signed)
Report called to [place], RN at this time.  Receiving nurse denies having any further questions at this time.

## 2016-07-01 NOTE — Procedures (Signed)
   US guided RLQ paracentesis  Small amount oif ascites noted 180 cc yellow fluid obtained  Sent for labs per MD  Tolerated well

## 2016-07-01 NOTE — H&P (Addendum)
History and Physical    [name] Foley [id number] DOB: Jun 26, 1946 DOA: 06/30/2016  Referring MD/NP/PA:   PCP: Philis Fendt, MD   Patient coming from:  The patient is coming from home.  At baseline, pt is independent for most of ADL. SNF  Assistant living facility   [facility].       Chief Complaint: Abdominal pain, diarrhea, dysuria, leg edema.  HPI: [name] Bleau is a 70 y.o. female with medical history significant of hypertension, hyperlipidemia, GERD, depression, anxiety, CAD, MI, PAD, chronic pancreatitis, alcohol abuse, tobacco abuse, who presents with abdominal pain, diarrhea, dysuria.  Patient was recently hospitalized from 8/29-8/30 due to diarrhea and hypotension. Patient had negative C. difficile PCR test on 06/22/16. Patient states that she has abdominal pain in the past several days. She also has abdominal distention. Her abdominal pain is diffuse, moderate, nonradiating. It is not aggravated or alleviated by any factors. She has nausea, but no vomiting. She has 2-3 bowel movement with loose stool each day. Patient does not have fever or chills. She also reports dysuria and burning on urination. She denies chest pain, shortness of breath, cough, unilateral weakness.  ED Course: pt was found to have hypotension with blood pressure 89/69 which improves with IV fluid resuscitation. WBC 9.1, lactate 3.64, BNP 46.4, lipase 11, posterior urinalysis with small amount of leukocytes, abnormal liver function with ALP 162, all between 1.8, AST 88, ALT 31, total bilirubin 5.5. AKI with Cre 1.35, sodium 131, potassium 4.4, bicarbonate 14, anion gap 11. Pt is admitted to stepdown bed for further eval and treatment. PCCM was consulted by EDP, recommended fluids resuscitation.  Review of Systems:   General: no fevers, chills, no changes in body weight, has poor appetite, has fatigue HEENT: no blurry vision, hearing changes or sore throat Respiratory: no dyspnea, coughing,  wheezing CV: no chest pain, no palpitations GI: has nausea, abdominal pain and diarrhea, no constipation or vomiting. Has abdominal distention.  GU: has dysuria, burning on urination, No increased urinary frequency, hematuria  Ext: has leg edema Neuro: no unilateral weakness, numbness, or tingling, no vision change or hearing loss Skin: no rash, no skin tear. MSK: No muscle spasm, no deformity, no limitation of range of movement in spin Heme: No easy bruising.  Travel history: No recent long distant travel.  Allergy:  Allergies  Allergen Reactions  . Diclofenac Sodium Other (See Comments)    REACTION: Chronic renal insufficiency. Patient can use the gel   . Other Other (See Comments)    Blackeyed peas and Pinto Beans...break out in hives    Past Medical History:  Diagnosis Date  . Alcohol abuse    Abstinent since 2009  . Chronic pancreatitis ([place])   . Colon adenoma 03/2012   Diminutive cecal  . Common bile duct stricture   . Coronary artery disease    DES x 2 to LAD 2007, DES to LAD 07/2012  . Depression   . Essential hypertension   . Fatty liver   . GERD (gastroesophageal reflux disease)   . GERD (gastroesophageal reflux disease)   . Hemorrhoids   . Hiatal hernia   . History of pneumonia 11/2011  . Hyperlipidemia   . PAD (peripheral artery disease) (HCC)    Occluded right external iliac artery   . Psoriasis   . ST elevation myocardial infarction (STEMI) of anterior wall ([place]) 07/2012  . Type 2 diabetes mellitus ([place])     Past Surgical History:  Procedure Laterality Date  .  ABDOMINAL HYSTERECTOMY  1994  . CHOLECYSTECTOMY  1976  . COLONOSCOPY     multiple  . ERCP  07/25/2012   Procedure: ENDOSCOPIC RETROGRADE CHOLANGIOPANCREATOGRAPHY (ERCP);  Surgeon: Gatha Mayer, MD;  Location: [facility];  Service: Endoscopy;  Laterality: N/A;  with stent  . ESOPHAGOGASTRODUODENOSCOPY  10/25/2012   Procedure: ESOPHAGOGASTRODUODENOSCOPY (EGD);  Surgeon: Gatha Mayer, MD;   Location: [facility];  Service: Endoscopy;  Laterality: N/A;  EGD with stent removal using ERCP scope  . EUS  01/05/2012   Procedure: UPPER ENDOSCOPIC ULTRASOUND (EUS) LINEAR;  Surgeon: Milus Banister, MD;  Location: [facility] ENDOSCOPY;  Service: Endoscopy;  Laterality: N/A;  radial linear  . HEMORRHOID SURGERY  09/17/2013   THD hem ligation/pexy  . LEFT HEART CATH N/A 08/10/2012   Procedure: LEFT HEART CATH;  Surgeon: Peter M Martinique, MD;  Location: [facility] CATH LAB;  Service: Cardiovascular;  Laterality: N/A;  . PERCUTANEOUS CORONARY STENT INTERVENTION (PCI-S)  08/10/2012   Procedure: PERCUTANEOUS CORONARY STENT INTERVENTION (PCI-S);  Surgeon: Peter M Martinique, MD;  Location: [facility] CATH LAB;  Service: Cardiovascular;;  . STRESS MYOCARDIAL PERFUSION  04/20/10   NORMAL PATTERN OF PERFUSION IN ALL REGIONS. LV SIZE IS NORMAL. EF= 61%. NO SIGN ISCHEMIA DEMONSTRATED.  Marland Kitchen TRANSTHORACIC ECHOCARDIOGRAM  04/20/10   PROXIMAL SEPTAL THICKENING IS NOTED. LV SYSTOLIC FUNCTION IS NORMAL. EF= >55%. LEFT ATRIAL SIZE IS NORMAL. RIGHT VENTRICULAR SYSTOLIC PRESSURE IS NORMAL. AV APPEARS TO BE MILDLY SCLEROTIC. NO AS.    Social History:  reports that she has quit smoking. Her smoking use included Cigarettes. She has a 7.50 pack-year smoking history. She has never used smokeless tobacco. She reports that she drinks alcohol. She reports that she does not use drugs.  Family History:  Family History  Problem Relation Age of Onset  . Colon cancer Mother     Colon resection  . Diabetes Mother   . Cirrhosis Father   . Coronary artery disease Brother   . Renal Disease Brother   . Malignant hyperthermia Neg Hx      Prior to Admission medications   Medication Sig Start Date End Date Taking? Authorizing Provider  acetaminophen (TYLENOL 8 HOUR) 650 MG CR tablet Take 1 tablet (650 mg total) by mouth every 8 (eight) hours as needed for pain. 02/05/16  Yes Varney Biles, MD  aspirin EC 81 MG EC tablet Take 1 tablet (81 mg total) by  mouth daily. 08/13/12  Yes Isaiah Serge, NP  atorvastatin (LIPITOR) 40 MG tablet TAKE ONE (1) TABLET EACH DAY AT 6PM Patient taking differently: Take 40 mg by mouth each day 09/16/13  Yes Lorretta Harp, MD  clonazePAM (KLONOPIN) 1 MG tablet Take 1 tablet (1 mg total) by mouth at bedtime. 09/02/15  Yes Gatha Mayer, MD  clopidogrel (PLAVIX) 75 MG tablet Take 75 mg by mouth every evening.    Yes Historical Provider, MD  feeding supplement, ENSURE ENLIVE, (ENSURE ENLIVE) LIQD Take 237 mLs by mouth 3 (three) times daily between meals. 06/17/16  Yes Costin Karlyne Greenspan, MD  ferrous gluconate (FERGON) 324 MG tablet Take 1 tablet (324 mg total) by mouth daily with breakfast. 06/17/16  Yes Costin Karlyne Greenspan, MD  magnesium gluconate (MAGONATE) 30 MG tablet Take 1 tablet (30 mg total) by mouth 2 (two) times daily. Patient taking differently: Take 30 mg by mouth every morning.  06/17/16  Yes Costin Karlyne Greenspan, MD  mirtazapine (REMERON) 15 MG tablet Take 1 tablet (15 mg total) by mouth at  bedtime. 07/13/12  Yes Gatha Mayer, MD  Multiple Vitamins-Minerals (MULTIVITAMIN) tablet Take 1 tablet by mouth daily. 06/17/16  Yes Costin Karlyne Greenspan, MD  nitroGLYCERIN (NITROSTAT) 0.4 MG SL tablet Place 1 tablet (0.4 mg total) under the tongue every 5 (five) minutes x 3 doses as needed for chest pain. 09/02/15  Yes Lorretta Harp, MD  omeprazole (PRILOSEC) 40 MG capsule TAKE ONE CAPSULE BY MOUTH DAILY Patient taking differently: TAKE 40 MG BY MOUTH DAILY 01/21/16  Yes Gatha Mayer, MD  Pancrelipase, Lip-Prot-Amyl, (CREON) 24000 UNITS CPEP TAKE 3 CAPSULES 3 TIMES A DAY WITH MEALS AND TAKE ONE CAPSULE WITH SNACKS Patient taking differently: Take 24,000-72,000 Units by mouth See admin instructions. Take 72000 units by mouth 3 times daily with meals and take 24000 units by mouth with snack. 09/02/15  Yes Gatha Mayer, MD  polyethylene glycol [facility] / GLYCOLAX) packet Take 17 g by mouth daily as needed for moderate  constipation.   Yes Historical Provider, MD  QUEtiapine (SEROQUEL) 50 MG tablet Take 50 mg by mouth at bedtime.   Yes Historical Provider, MD  traMADol (ULTRAM) 50 MG tablet Take 0.5 tablets (25 mg total) by mouth every 6 (six) hours as needed for moderate pain or severe pain. 06/17/16  Yes Costin Karlyne Greenspan, MD  diclofenac sodium (VOLTAREN) 1 % GEL Apply 4 g topically 4 (four) times daily. Patient not taking: Reported on 06/30/2016 09/02/15   Gatha Mayer, MD  nicotine (NICODERM CQ) 14 mg/24hr patch Place 1 patch onto the skin daily. Patient not taking: Reported on 06/30/2016 04/03/13   Lorretta Harp, MD    Physical Exam: Vitals:   07/01/16 0440 07/01/16 0445 07/01/16 0450 07/01/16 0455  BP: (!) 87/44 (!) 85/53 (!) 76/56 (!) 86/66  Pulse: 95 92 92 93  Resp: 23 23 21 23   Temp:      TempSrc:      SpO2: 92% 95% 95% 97%   General: Not in acute distress, dry mucus membrane HEENT:       Eyes: PERRL, EOMI, no scleral icterus.       ENT: No discharge from the ears and nose, no pharynx injection, no tonsillar enlargement.        Neck: No JVD, no bruit, no mass felt. Heme: No neck lymph node enlargement. Cardiac: S1/S2, RRR, No murmurs, No gallops or rubs. Respiratory: No rales, wheezing, rhonchi or rubs. GI: distended, has diffused tenderness, no rebound pain, no organomegaly, BS present. GU: No hematuria Ext: 2+ pitting leg edema bilaterally. 2+DP/PT pulse bilaterally. Musculoskeletal: No joint deformities, No joint redness or warmth, no limitation of ROM in spin. Skin: No rashes.  Neuro: Alert, oriented X3, cranial nerves II-XII grossly intact, moves all extremities normally.  Psych: Patient is not psychotic, no suicidal or hemocidal ideation.  Labs on Admission: I have personally reviewed following labs and imaging studies  CBC:  Recent Labs Lab 06/30/16 1823 07/01/16 0411  WBC 9.1 5.4  NEUTROABS 5.4  --   HGB 10.1* 7.4*  HCT 29.6* 21.7*  MCV 68.7* 68.0*  PLT 265 [id number]   Basic  Metabolic Panel:  Recent Labs Lab 06/30/16 1823 07/01/16 0411  NA 131* 135  K 4.4 2.8*  CL 105 108  CO2 15* 17*  GLUCOSE 129* 79  BUN 11 8  CREATININE 1.35* 1.05*  CALCIUM 7.6* 7.1*   GFR: Estimated Creatinine Clearance: 44.3 mL/min (by C-G formula based on SCr of 1.05 mg/dL). Liver Function Tests:  Recent Labs  Lab 06/30/16 1823 07/01/16 0411  AST 88* 58*  ALT 31 23  ALKPHOS 162* 117  BILITOT 5.5* 4.3*  PROT 7.3 5.8*  ALBUMIN 1.8* 2.2*    Recent Labs Lab 06/30/16 1823  LIPASE 11   No results for input(s): AMMONIA in the last 168 hours. Coagulation Profile:  Recent Labs Lab 07/01/16 0411  INR 3.31   Cardiac Enzymes: No results for input(s): CKTOTAL, CKMB, CKMBINDEX, TROPONINI in the last 168 hours. BNP (last 3 results) No results for input(s): PROBNP in the last 8760 hours. HbA1C: No results for input(s): HGBA1C in the last 72 hours. CBG: No results for input(s): GLUCAP in the last 168 hours. Lipid Profile: No results for input(s): CHOL, HDL, LDLCALC, TRIG, CHOLHDL, LDLDIRECT in the last 72 hours. Thyroid Function Tests: No results for input(s): TSH, T4TOTAL, FREET4, T3FREE, THYROIDAB in the last 72 hours. Anemia Panel: No results for input(s): VITAMINB12, FOLATE, FERRITIN, TIBC, IRON, RETICCTPCT in the last 72 hours. Urine analysis:    Component Value Date/Time   COLORURINE AMBER (A) 06/30/2016 2055   APPEARANCEUR CLOUDY (A) 06/30/2016 2055   LABSPEC 1.016 06/30/2016 2055   PHURINE 5.5 06/30/2016 2055   GLUCOSEU NEGATIVE 06/30/2016 2055   HGBUR NEGATIVE 06/30/2016 2055   BILIRUBINUR MODERATE (A) 06/30/2016 2055   KETONESUR NEGATIVE 06/30/2016 2055   PROTEINUR NEGATIVE 06/30/2016 2055   UROBILINOGEN 1.0 07/03/2012 2021   NITRITE NEGATIVE 06/30/2016 2055   LEUKOCYTESUR SMALL (A) 06/30/2016 2055   Sepsis Labs: @LABRCNTIP (procalcitonin:4,lacticidven:4) ) Recent Results (from the past 240 hour(s))  Gastrointestinal Panel by PCR , Stool      Status: None   Collection Time: 06/22/16  5:58 AM  Result Value Ref Range Status   Campylobacter species NOT DETECTED NOT DETECTED Final   Plesimonas shigelloides NOT DETECTED NOT DETECTED Final   Salmonella species NOT DETECTED NOT DETECTED Final   Yersinia enterocolitica NOT DETECTED NOT DETECTED Final   Vibrio species NOT DETECTED NOT DETECTED Final   Vibrio cholerae NOT DETECTED NOT DETECTED Final   Enteroaggregative E coli (EAEC) NOT DETECTED NOT DETECTED Final   Enteropathogenic E coli (EPEC) NOT DETECTED NOT DETECTED Final   Enterotoxigenic E coli (ETEC) NOT DETECTED NOT DETECTED Final   Shiga like toxin producing E coli (STEC) NOT DETECTED NOT DETECTED Final   E. coli O157 NOT DETECTED NOT DETECTED Final   Shigella/Enteroinvasive E coli (EIEC) NOT DETECTED NOT DETECTED Final   Cryptosporidium NOT DETECTED NOT DETECTED Final   Cyclospora cayetanensis NOT DETECTED NOT DETECTED Final   Entamoeba histolytica NOT DETECTED NOT DETECTED Final   Giardia lamblia NOT DETECTED NOT DETECTED Final   Adenovirus F40/41 NOT DETECTED NOT DETECTED Final   Astrovirus NOT DETECTED NOT DETECTED Final   Norovirus GI/GII NOT DETECTED NOT DETECTED Final   Rotavirus A NOT DETECTED NOT DETECTED Final   Sapovirus (I, II, IV, and V) NOT DETECTED NOT DETECTED Final  C difficile quick scan w PCR reflex     Status: None   Collection Time: 06/22/16  5:58 AM  Result Value Ref Range Status   C Diff antigen NEGATIVE NEGATIVE Final   C Diff toxin NEGATIVE NEGATIVE Final   C Diff interpretation No C. difficile detected.  Final     Radiological Exams on Admission: US Abdomen Complete  Result Date: 07/01/2016 CLINICAL DATA:  Acute onset of upper abdominal pain and abdominal distention. Initial encounter. EXAM: ABDOMEN ULTRASOUND COMPLETE COMPARISON:  CT of the abdomen and pelvis, and right upper quadrant ultrasound, performed 06/08/2016  FINDINGS: Gallbladder: Status post cholecystectomy.  No retained stones seen.  Common bile duct: Diameter: 0.8 cm, within normal limits in caliber status post cholecystectomy. Liver: No focal lesion identified. There is diffuse heterogeneity within the liver, with increased parenchymal echogenicity, likely reflecting mild hepatic cirrhosis. The liver is mildly enlarged. IVC: No abnormality visualized. Pancreas: Visualized portion unremarkable. Spleen: Size and appearance within normal limits. Right Kidney: Length: 9.7 cm. Echogenicity within normal limits. No mass or hydronephrosis visualized. Left Kidney: Length: 10.2 cm. Echogenicity within normal limits. No mass or hydronephrosis visualized. Abdominal aorta: No aneurysm visualized. Not fully characterized due to overlying bowel gas. Other findings: Small to moderate volume ascites is noted within the abdomen. IMPRESSION: 1. Small to moderate volume ascites within the abdomen. 2. Diffuse heterogeneity within the liver, with increased parenchymal echogenicity, likely reflecting mild hepatic cirrhosis. Mild hepatomegaly. 3. Status post cholecystectomy. Electronically Signed   By: Garald Balding M.D.   On: 07/01/2016 05:22     EKG: Independently reviewed.  Not done in ED, will get one.   Assessment/Plan Principal Problem:   Abdominal pain Active Problems:   Hyperlipidemia with target LDL less than 100   PANCREATITIS, CHRONIC    Chronic diarrhea - ? steatorrhea vs. IBS or both   Alcohol abuse   Hypotension   UTI (lower urinary tract infection)   Leg swelling   AKI (acute kidney injury) (HCC)   Abnormal LFTs   Abdominal pain and sepsis: Etiology is not clear. Previous CT abdomen/pelvis on 8/16 showed small amount of ascites, but her abdominal is distended today, concerning for more ascites. She has abnomal LFT. SBP is a potential differential diagnosis. Her lipase is normal at 11. Pt is septic with hypotension and elevated lactate 3.64. Blood pressure response to IV fluid resuscitation, but it drops intermittently. [facility] was  consulted by Dr. Myrene Buddy recommended fluid resuscitation.  -will admit SDU as inpt. -start IV rocephin -will give total of 100 g of albumin by IV -will get Procalcitonin and trend lactic acid levels per sepsis protocol. -IVF: 3.5L of NS bolus in ED, followed by 125 cc/h -will get US-abd.  - Check hepatitis panel. -f/u Blood culture -Please call IR for paracentesis  Chronic diarrhea: Etiology is not clear. 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 She had a negative C. difficile PCR on 06/22/16. -Continue Creon -check GI path panel  HLD: Last LDL was 50 on 08/11/12 -Continue home medications: Lipitor -Check FLP  Alcohol abuse: -Did counseling about the importance of quitting drinking -CIWA protocol  UTI (lower urinary tract infection): Urinalysis shows small amount of leukocyte. Patient has dysuria and burning on urination, indicating possible UTI. -On Rocephin as above -Follow Blood culture and urine culture  AKI: Likely due to ATN 2/2 to hypotension. prerenal failure is also possible. - IVF as above - Check FeNa - Follow up renal function by BMP  Leg swelling: Most likely due to hypoalbuminemia. BNP 46.4, less likely to have CHF. -Will give 100 g of albumin by IV   ED Sepsis - Assessment   Performed at:    5:35 AM   Last Vitals:    Blood pressure 93/67, pulse 92, temperature 97.8 F (36.6 C), temperature source Oral, resp. rate 20, SpO2 97 %.  Heart:      Regular rhythm Tachchycarida  Lungs:     Clear to auscultation  Capillary Refill:   <2 seconds    Peripheral Pulse (include location): Brachial pulse palpable   Skin (include color):  Pale    DVT ppx: SCD Code Status: Full code Family Communication: None at bed side.  Disposition Plan:  Anticipate discharge back to previous home environment Consults called:  PCCM was consulted Admission status:  SDU/inpation       Date of Service 07/01/2016    Ivor Costa [facility] Pager [phone]  If 7PM-7AM, please contact night-coverage [email or web address] Password [email or web address] 07/01/2016, 5:35 AM

## 2016-07-01 NOTE — Progress Notes (Addendum)
PROGRESS NOTE                                                                                                                                                                                                             Patient Demographics:    [name] Foley, is a 70 y.o. female, DOB - 05-29-1946, QQV:[id number]  Admit date - 06/30/2016   Admitting Physician Lorretta Harp, MD  Outpatient Primary MD for the patient is Dorrene German, MD  LOS - 0  Chief Complaint  Patient presents with  . Abdominal Pain  . Leg Swelling       Brief Narrative    [name] Marks is a 70 y.o. female with medical history significant of hypertension, hyperlipidemia, GERD, depression, anxiety, CAD, MI, PAD, chronic pancreatitis, alcohol abuse, tobacco abuse, who presents with abdominal pain, diarrhea, dysuria.  Patient was recently hospitalized from 8/29-8/30 due to diarrhea and hypotension. Patient had negative C. difficile PCR test on 06/22/16. Patient states that she has abdominal pain in the past several days. She also has abdominal distention. Her abdominal pain is diffuse, moderate, nonradiating. It is not aggravated or alleviated by any factors. She has nausea, but no vomiting. She has 2-3 bowel movement with loose stool each day. Patient does not have fever or chills. She also reports dysuria and burning on urination. She denies chest pain, shortness of breath, cough, unilateral weakness.  ED Course: pt was found to have hypotension with blood pressure 89/69 which improves with IV fluid resuscitation. WBC 9.1, lactate 3.64, BNP 46.4, lipase 11, posterior urinalysis with small amount of leukocytes, abnormal liver function with ALP 162, all between 1.8, AST 88, ALT 31, total bilirubin 5.5. AKI with Cre 1.35, sodium 131, potassium 4.4, bicarbonate 14, anion gap 11. Pt is admitted to stepdown bed for further eval and treatment. PCCM was consulted by  EDP, recommended fluids resuscitation.    Subjective:    [name] Donnelly today has, No headache, No chest pain, Mild generalized abdominal pain - No Nausea, No new weakness tingling or numbness, No Cough - SOB.     Assessment  & Plan :     1.Generalized abdominal pain with hypotension. Possible sepsis versus severe dehydration. Etiology unclear, has minimal ascites and UTI. But afebrile and without leukocytosis. Currently Rocephin for UTI and possible SBP, ultrasound-guided paracentesis ordered,  check CT scan abdomen and pelvis to rule out bowel ischemia or colitis, will check abdominal x-ray stat to rule out obstruction. Bowel rest with clear liquids for now and supportive care. Continue IV fluids for hydration. Trend lactate and pro-calcitonin.  2. History of chronic pancreatitis. Lipase is stable, CT scan as above has been ordered, counseled to quit alcohol, continue pancreatic enzymes with meals.  3. UTI. On Rocephin and continue, monitor cultures.  4. Diet-controlled type 2 diabetes mellitus we'll continue to monitor.  5. Dehydration causing hyponatremia. Improved with IV fluids.  6. Chronic iron deficiency anemia with acute fall due to IV fluids causing dilution. Check anemia panel trend H&H, type and screen, PPI to continue.  7. CAD. No acute issues chest pain-free. On aspirin and Plavix for secondary prevention. Blood pressure too low for beta blocker, statins if liver enzymes and otherwise stable.  8. Leg swelling and ascites. Due to cirrhosis and hypoalbuminemia. Supportive care. Diurese once pressure is better.  9. History of alcohol abuse. Counseled to quit, she says last drink was 2 weeks ago, monitor on CIWA protocol.  10. Chronic diarrhea. Recently ruled out C. difficile, continue Creon, GI pathogen panel ordered upon admission, will give Imodium for supportive care once infection is ruled out.  11.Hypokalemia. Replaced will monitor.   Psych called upon the request of  Adult Protective Services RN in the outpatient setting. Patient apparently being followed by Adult Protective Services for self-neglect. Psych has been requested to see the patient.    Family Communication  :  None  Code Status :  Full  Diet : Clears  Disposition Plan  :  Step down  Consults  :  Psych called upon the request of Adult Protective Services RN in the outpatient setting.  Procedures  :    Abdominal ultrasound. Small ascites and cirrhosis  CT abdomen and pelvis ordered.  DVT Prophylaxis  :   SCDs    Lab Results  Component Value Date   PLT 150 07/01/2016    Inpatient Medications  Scheduled Meds: . aspirin EC  81 mg Oral Daily  . [START ON 07/02/2016] cefTRIAXone (ROCEPHIN)  IV  2 g Intravenous Q24H  . clonazePAM  1 mg Oral QHS  . clopidogrel  75 mg Oral QPM  . ferrous gluconate  324 mg Oral Q breakfast  . folic acid  1 mg Oral Daily  . lipase/protease/amylase  72,000 Units Oral TID WC  . LORazepam  0-4 mg Intravenous Q6H   Followed by  . [START ON 07/03/2016] LORazepam  0-4 mg Intravenous Q12H  . magnesium gluconate  500 mg Oral Daily  . mirtazapine  15 mg Oral QHS  . multivitamin with minerals  1 tablet Oral Daily  . pantoprazole  40 mg Oral Daily  . potassium chloride  10 mEq Intravenous Q1 Hr x 4  . potassium chloride  40 mEq Oral Once  . QUEtiapine  50 mg Oral QHS  . sodium chloride flush  3 mL Intravenous Q12H  . thiamine  100 mg Oral Daily   Or  . thiamine  100 mg Intravenous Daily   Continuous Infusions: . sodium chloride 125 mL/hr at 07/01/16 0510   PRN Meds:.albumin human, lipase/protease/amylase, LORazepam **OR** LORazepam, morphine injection, nitroGLYCERIN, ondansetron **OR** ondansetron (ZOFRAN) IV, traMADol  Antibiotics  :    Anti-infectives    Start     Dose/Rate Route Frequency Ordered Stop   07/02/16 0000  cefTRIAXone (ROCEPHIN) 2 g in dextrose 5 % 50 mL  IVPB     2 g 100 mL/hr over 30 Minutes Intravenous Every 24 hours 07/01/16  0334     07/01/16 0130  cefTRIAXone (ROCEPHIN) 2 g in dextrose 5 % 50 mL IVPB     2 g 100 mL/hr over 30 Minutes Intravenous  Once 07/01/16 0115 07/01/16 0202         Objective:   Vitals:   07/01/16 0805 07/01/16 0815 07/01/16 0830 07/01/16 0845  BP: 105/73 94/63 (!) 91/56 92/67  Pulse: (!) 104 93    Resp: (!) 30 (!) 25 (!) 23 (!) 23  Temp:      TempSrc:      SpO2: 94% 95% 95% 93%    Wt Readings from Last 3 Encounters:  06/22/16 61.6 kg (135 lb 12.9 oz)  06/15/16 62.6 kg (138 lb 0.1 oz)  06/08/16 60.6 kg (133 lb 9.6 oz)     Intake/Output Summary (Last 24 hours) at 07/01/16 0947 Last data filed at 07/01/16 0818  Gross per 24 hour  Intake           450.83 ml  Output              375 ml  Net            75.83 ml     Physical Exam  Awake Alert, Oriented X 3, No new F.N deficits, Normal affect [place].AT,PERRAL Supple Neck,No JVD, No cervical lymphadenopathy appriciated.  Symmetrical Chest wall movement, Good air movement bilaterally, CTAB RRR,No Gallops,Rubs or new Murmurs, No Parasternal Heave +ve B.Sounds, Abd Soft but mildly distended, No tenderness, No organomegaly appriciated, No rebound - guarding or rigidity. No Cyanosis, Clubbing or edema, No new Rash or bruise       Data Review:    CBC  Recent Labs Lab 06/30/16 1823 07/01/16 0411  WBC 9.1 5.4  HGB 10.1* 7.4*  HCT 29.6* 21.7*  PLT 265 150  MCV 68.7* 68.0*  MCH 23.4* 23.2*  MCHC 34.1 34.1  RDW 20.9* 20.6*  LYMPHSABS 2.5  --   MONOABS 1.1*  --   EOSABS 0.1  --   BASOSABS 0.0  --     Chemistries   Recent Labs Lab 06/30/16 1823 07/01/16 0411  NA 131* 135  K 4.4 2.8*  CL 105 108  CO2 15* 17*  GLUCOSE 129* 79  BUN 11 8  CREATININE 1.35* 1.05*  CALCIUM 7.6* 7.1*  AST 88* 58*  ALT 31 23  ALKPHOS 162* 117  BILITOT 5.5* 4.3*   ------------------------------------------------------------------------------------------------------------------ No results for input(s): CHOL, HDL, LDLCALC, TRIG,  CHOLHDL, LDLDIRECT in the last 72 hours.  Lab Results  Component Value Date   HGBA1C 6.0 (H) 08/11/2012   ------------------------------------------------------------------------------------------------------------------ No results for input(s): TSH, T4TOTAL, T3FREE, THYROIDAB in the last 72 hours.  Invalid input(s): FREET3 ------------------------------------------------------------------------------------------------------------------ No results for input(s): VITAMINB12, FOLATE, FERRITIN, TIBC, IRON, RETICCTPCT in the last 72 hours.  Coagulation profile  Recent Labs Lab 07/01/16 0411  INR 3.31    No results for input(s): DDIMER in the last 72 hours.  Cardiac Enzymes No results for input(s): CKMB, TROPONINI, MYOGLOBIN in the last 168 hours.  Invalid input(s): CK ------------------------------------------------------------------------------------------------------------------    Component Value Date/Time   BNP 46.4 06/30/2016 1823    Micro Results Recent Results (from the past 240 hour(s))  Gastrointestinal Panel by PCR , Stool     Status: None   Collection Time: 06/22/16  5:58 AM  Result Value Ref Range Status   Campylobacter species NOT DETECTED  NOT DETECTED Final   Plesimonas shigelloides NOT DETECTED NOT DETECTED Final   Salmonella species NOT DETECTED NOT DETECTED Final   Yersinia enterocolitica NOT DETECTED NOT DETECTED Final   Vibrio species NOT DETECTED NOT DETECTED Final   Vibrio cholerae NOT DETECTED NOT DETECTED Final   Enteroaggregative E coli (EAEC) NOT DETECTED NOT DETECTED Final   Enteropathogenic E coli (EPEC) NOT DETECTED NOT DETECTED Final   Enterotoxigenic E coli (ETEC) NOT DETECTED NOT DETECTED Final   Shiga like toxin producing E coli (STEC) NOT DETECTED NOT DETECTED Final   E. coli O157 NOT DETECTED NOT DETECTED Final   Shigella/Enteroinvasive E coli (EIEC) NOT DETECTED NOT DETECTED Final   Cryptosporidium NOT DETECTED NOT DETECTED Final    Cyclospora cayetanensis NOT DETECTED NOT DETECTED Final   Entamoeba histolytica NOT DETECTED NOT DETECTED Final   Giardia lamblia NOT DETECTED NOT DETECTED Final   Adenovirus F40/41 NOT DETECTED NOT DETECTED Final   Astrovirus NOT DETECTED NOT DETECTED Final   Norovirus GI/GII NOT DETECTED NOT DETECTED Final   Rotavirus A NOT DETECTED NOT DETECTED Final   Sapovirus (I, II, IV, and V) NOT DETECTED NOT DETECTED Final  C difficile quick scan w PCR reflex     Status: None   Collection Time: 06/22/16  5:58 AM  Result Value Ref Range Status   C Diff antigen NEGATIVE NEGATIVE Final   C Diff toxin NEGATIVE NEGATIVE Final   C Diff interpretation No C. difficile detected.  Final  Culture, blood (x 2)     Status: None (Preliminary result)   Collection Time: 07/01/16  4:01 AM  Result Value Ref Range Status   Specimen Description BLOOD RIGHT HAND  Final   Special Requests IN PEDIATRIC BOTTLE  Final   Culture NO GROWTH < 12 HOURS  Final   Report Status PENDING  Incomplete  Culture, blood (x 2)     Status: None (Preliminary result)   Collection Time: 07/01/16  4:11 AM  Result Value Ref Range Status   Specimen Description BLOOD LEFT FOREARM  Final   Special Requests BOTTLES DRAWN AEROBIC AND ANAEROBIC  Final   Culture NO GROWTH < 12 HOURS  Final   Report Status PENDING  Incomplete  MRSA PCR Screening     Status: None   Collection Time: 07/01/16  7:56 AM  Result Value Ref Range Status   MRSA by PCR NEGATIVE NEGATIVE Final    Comment:        The GeneXpert MRSA Assay (FDA approved for NASAL specimens only), is one component of a comprehensive MRSA colonization surveillance program. It is not intended to diagnose MRSA infection nor to guide or monitor treatment for MRSA infections.     Radiology Reports  Dg Chest 2 View  Result Date: 06/14/2016 CLINICAL DATA:  Bilateral ankle edema, cough EXAM: CHEST  2 VIEW COMPARISON:  05/16/2016 FINDINGS: There is no focal parenchymal opacity.  There is no pleural effusion or pneumothorax. The heart and mediastinal contours are unremarkable. The osseous structures are unremarkable. IMPRESSION: No active cardiopulmonary disease. Electronically Signed   By: Elige Ko   On: 06/14/2016 17:03   US Abdomen Complete  Result Date: 07/01/2016 CLINICAL DATA:  Acute onset of upper abdominal pain and abdominal distention. Initial encounter. EXAM: ABDOMEN ULTRASOUND COMPLETE COMPARISON:  CT of the abdomen and pelvis, and right upper quadrant ultrasound, performed 06/08/2016 FINDINGS: Gallbladder: Status post cholecystectomy.  No retained stones seen. Common bile duct: Diameter: 0.8 cm, within normal limits in  caliber status post cholecystectomy. Liver: No focal lesion identified. There is diffuse heterogeneity within the liver, with increased parenchymal echogenicity, likely reflecting mild hepatic cirrhosis. The liver is mildly enlarged. IVC: No abnormality visualized. Pancreas: Visualized portion unremarkable. Spleen: Size and appearance within normal limits. Right Kidney: Length: 9.7 cm. Echogenicity within normal limits. No mass or hydronephrosis visualized. Left Kidney: Length: 10.2 cm. Echogenicity within normal limits. No mass or hydronephrosis visualized. Abdominal aorta: No aneurysm visualized. Not fully characterized due to overlying bowel gas. Other findings: Small to moderate volume ascites is noted within the abdomen. IMPRESSION: 1. Small to moderate volume ascites within the abdomen. 2. Diffuse heterogeneity within the liver, with increased parenchymal echogenicity, likely reflecting mild hepatic cirrhosis. Mild hepatomegaly. 3. Status post cholecystectomy. Electronically Signed   By: Roanna Raider M.D.   On: 07/01/2016 05:22   Time Spent in minutes  30   Kaylee Trivett K M.D on 07/01/2016 at 9:47 AM  Between 7am to 7pm - Pager - [phone]  After 7pm go to [email or web address] - password [facility]  [facility] -  Office  [phone]

## 2016-07-01 NOTE — Clinical Social Work Note (Signed)
CSW consulted for New SNF. Pt is also being followed by [place] [facility] ([facility]) for self neglect. APS SW is Eliezer Bottom and her is number [phone]. PT/OT are pending, as well as Psych consult. Full assessment to follow. CSW will continue to follow.   Darden Dates, MSW, LCSW  Clinical Social Worker  [phone]

## 2016-07-01 NOTE — ED Provider Notes (Signed)
[facility] DEPT Provider Note   CSN: [id number] Arrival date & time: 06/30/16  1725     History   Chief Complaint Chief Complaint  Patient presents with  . Abdominal Pain  . Leg Swelling    HPI [name] Foley is a 70 y.o. female.  The history is provided by the patient. No language interpreter was used.  Abdominal Pain   This is a recurrent problem. The current episode started more than 1 week ago. The problem occurs constantly. The problem has been gradually worsening. The pain is associated with alcohol use. The pain is located in the generalized abdominal region. The pain is moderate. Associated symptoms include diarrhea and nausea. Pertinent negatives include vomiting. Nothing aggravates the symptoms. Nothing relieves the symptoms. Past workup includes GI consult.  Pt complains of increased abdominal pain. Pt admits to recent EToh. No etoh today Pt reports she has increased swelling in legs.  Pt reports some increased abdominal swelling.   Past Medical History:  Diagnosis Date  . Alcohol abuse    Abstinent since 2009  . Chronic pancreatitis ([place])   . Colon adenoma 03/2012   Diminutive cecal  . Common bile duct stricture   . Coronary artery disease    DES x 2 to LAD 2007, DES to LAD 07/2012  . Depression   . Essential hypertension   . Fatty liver   . GERD (gastroesophageal reflux disease)   . GERD (gastroesophageal reflux disease)   . Hemorrhoids   . Hiatal hernia   . History of pneumonia 11/2011  . Hyperlipidemia   . PAD (peripheral artery disease) (HCC)    Occluded right external iliac artery   . Psoriasis   . ST elevation myocardial infarction (STEMI) of anterior wall ([place]) 07/2012  . Type 2 diabetes mellitus [facility])     Patient Active Problem List   Diagnosis Date Noted  . Hypotension 06/21/2016  . Alcohol abuse 06/14/2016  . Abdominal pain, chronic, epigastric - from chronic pancreatitis 05/26/2014  . Chronic diarrhea - ? steatorrhea vs. IBS or both  04/09/2012  . PANCREATITIS, CHRONIC  08/13/2008  . Hyperlipidemia with target LDL less than 100 01/25/2007  . TOBACCO DEPENDENCE 12/21/2006    Past Surgical History:  Procedure Laterality Date  . ABDOMINAL HYSTERECTOMY  1994  . CHOLECYSTECTOMY  1976  . COLONOSCOPY     multiple  . ERCP  07/25/2012   Procedure: ENDOSCOPIC RETROGRADE CHOLANGIOPANCREATOGRAPHY (ERCP);  Surgeon: Gatha Mayer, MD;  Location: [facility];  Service: Endoscopy;  Laterality: N/A;  with stent  . ESOPHAGOGASTRODUODENOSCOPY  10/25/2012   Procedure: ESOPHAGOGASTRODUODENOSCOPY (EGD);  Surgeon: Gatha Mayer, MD;  Location: [facility];  Service: Endoscopy;  Laterality: N/A;  EGD with stent removal using ERCP scope  . EUS  01/05/2012   Procedure: UPPER ENDOSCOPIC ULTRASOUND (EUS) LINEAR;  Surgeon: Milus Banister, MD;  Location: [facility] ENDOSCOPY;  Service: Endoscopy;  Laterality: N/A;  radial linear  . HEMORRHOID SURGERY  09/17/2013   THD hem ligation/pexy  . LEFT HEART CATH N/A 08/10/2012   Procedure: LEFT HEART CATH;  Surgeon: Peter M Martinique, MD;  Location: [facility] CATH LAB;  Service: Cardiovascular;  Laterality: N/A;  . PERCUTANEOUS CORONARY STENT INTERVENTION (PCI-S)  08/10/2012   Procedure: PERCUTANEOUS CORONARY STENT INTERVENTION (PCI-S);  Surgeon: Peter M Martinique, MD;  Location: [facility] CATH LAB;  Service: Cardiovascular;;  . STRESS MYOCARDIAL PERFUSION  04/20/10   NORMAL PATTERN OF PERFUSION IN ALL REGIONS. LV SIZE IS NORMAL. EF= 61%. NO SIGN ISCHEMIA DEMONSTRATED.  Marland Kitchen  TRANSTHORACIC ECHOCARDIOGRAM  04/20/10   PROXIMAL SEPTAL THICKENING IS NOTED. LV SYSTOLIC FUNCTION IS NORMAL. EF= >55%. LEFT ATRIAL SIZE IS NORMAL. RIGHT VENTRICULAR SYSTOLIC PRESSURE IS NORMAL. AV APPEARS TO BE MILDLY SCLEROTIC. NO AS.    OB History    No data available       Home Medications    Prior to Admission medications   Medication Sig Start Date End Date Taking? Authorizing Provider  acetaminophen (TYLENOL 8 HOUR) 650 MG CR tablet Take 1 tablet  (650 mg total) by mouth every 8 (eight) hours as needed for pain. 02/05/16  Yes Varney Biles, MD  aspirin EC 81 MG EC tablet Take 1 tablet (81 mg total) by mouth daily. 08/13/12  Yes Isaiah Serge, NP  atorvastatin (LIPITOR) 40 MG tablet TAKE ONE (1) TABLET EACH DAY AT 6PM Patient taking differently: Take 40 mg by mouth each day 09/16/13  Yes Lorretta Harp, MD  clonazePAM (KLONOPIN) 1 MG tablet Take 1 tablet (1 mg total) by mouth at bedtime. 09/02/15  Yes Gatha Mayer, MD  clopidogrel (PLAVIX) 75 MG tablet Take 75 mg by mouth every evening.    Yes Historical Provider, MD  feeding supplement, ENSURE ENLIVE, (ENSURE ENLIVE) LIQD Take 237 mLs by mouth 3 (three) times daily between meals. 06/17/16  Yes Costin Karlyne Greenspan, MD  ferrous gluconate (FERGON) 324 MG tablet Take 1 tablet (324 mg total) by mouth daily with breakfast. 06/17/16  Yes Costin Karlyne Greenspan, MD  magnesium gluconate (MAGONATE) 30 MG tablet Take 1 tablet (30 mg total) by mouth 2 (two) times daily. Patient taking differently: Take 30 mg by mouth every morning.  06/17/16  Yes Costin Karlyne Greenspan, MD  mirtazapine (REMERON) 15 MG tablet Take 1 tablet (15 mg total) by mouth at bedtime. 07/13/12  Yes Gatha Mayer, MD  Multiple Vitamins-Minerals (MULTIVITAMIN) tablet Take 1 tablet by mouth daily. 06/17/16  Yes Costin Karlyne Greenspan, MD  nitroGLYCERIN (NITROSTAT) 0.4 MG SL tablet Place 1 tablet (0.4 mg total) under the tongue every 5 (five) minutes x 3 doses as needed for chest pain. 09/02/15  Yes Lorretta Harp, MD  omeprazole (PRILOSEC) 40 MG capsule TAKE ONE CAPSULE BY MOUTH DAILY Patient taking differently: TAKE 40 MG BY MOUTH DAILY 01/21/16  Yes Gatha Mayer, MD  Pancrelipase, Lip-Prot-Amyl, (CREON) 24000 UNITS CPEP TAKE 3 CAPSULES 3 TIMES A DAY WITH MEALS AND TAKE ONE CAPSULE WITH SNACKS Patient taking differently: Take 24,000-72,000 Units by mouth See admin instructions. Take 72000 units by mouth 3 times daily with meals and take 24000 units by  mouth with snack. 09/02/15  Yes Gatha Mayer, MD  polyethylene glycol [facility] / GLYCOLAX) packet Take 17 g by mouth daily as needed for moderate constipation.   Yes Historical Provider, MD  QUEtiapine (SEROQUEL) 50 MG tablet Take 50 mg by mouth at bedtime.   Yes Historical Provider, MD  traMADol (ULTRAM) 50 MG tablet Take 0.5 tablets (25 mg total) by mouth every 6 (six) hours as needed for moderate pain or severe pain. 06/17/16  Yes Costin Karlyne Greenspan, MD  diclofenac sodium (VOLTAREN) 1 % GEL Apply 4 g topically 4 (four) times daily. Patient not taking: Reported on 06/30/2016 09/02/15   Gatha Mayer, MD  nicotine (NICODERM CQ) 14 mg/24hr patch Place 1 patch onto the skin daily. Patient not taking: Reported on 06/30/2016 04/03/13   Lorretta Harp, MD    Family History Family History  Problem Relation Age of Onset  .  Colon cancer Mother     Colon resection  . Diabetes Mother   . Cirrhosis Father   . Coronary artery disease Brother   . Renal Disease Brother   . Malignant hyperthermia Neg Hx     Social History Social History  Substance Use Topics  . Smoking status: Former Smoker    Packs/day: 0.50    Years: 15.00    Types: Cigarettes  . Smokeless tobacco: Never Used     Comment: 2 cigarettes a day  . Alcohol use No     Comment: Past h/o alcohol abuse, quit 4/09     Allergies   Diclofenac sodium and Other   Review of Systems Review of Systems  Gastrointestinal: Positive for abdominal pain, diarrhea and nausea. Negative for vomiting.  Skin: Positive for color change.  All other systems reviewed and are negative.    Physical Exam Updated Vital Signs BP 95/71   Pulse 90   Temp 97.8 F (36.6 C) (Oral)   Resp 21   SpO2 98%   Physical Exam  Constitutional: She appears well-developed and well-nourished. No distress.  HENT:  Head: Normocephalic and atraumatic.  Eyes: Conjunctivae are normal.  Neck: Neck supple.  Cardiovascular: Normal rate and regular rhythm.   No  murmur heard. Pulmonary/Chest: Effort normal and breath sounds normal. No respiratory distress.  Abdominal: Soft. She exhibits distension. There is tenderness.  Musculoskeletal: She exhibits edema.  Neurological: She is alert.  Skin: Skin is warm and dry.  Psychiatric: She has a normal mood and affect.  Nursing note and vitals reviewed.    ED Treatments / Results  Labs (all labs ordered are listed, but only abnormal results are displayed) Labs Reviewed  COMPREHENSIVE METABOLIC PANEL - Abnormal; Notable for the following:       Result Value   Sodium 131 (*)    CO2 15 (*)    Glucose, Bld 129 (*)    Creatinine, Ser 1.35 (*)    Calcium 7.6 (*)    Albumin 1.8 (*)    AST 88 (*)    Alkaline Phosphatase 162 (*)    Total Bilirubin 5.5 (*)    GFR calc non Af Amer 39 (*)    GFR calc Af Amer 46 (*)    All other components within normal limits  CBC WITH DIFFERENTIAL/PLATELET - Abnormal; Notable for the following:    Hemoglobin 10.1 (*)    HCT 29.6 (*)    MCV 68.7 (*)    MCH 23.4 (*)    RDW 20.9 (*)    Monocytes Absolute 1.1 (*)    All other components within normal limits  URINALYSIS, ROUTINE W REFLEX MICROSCOPIC (NOT AT [facility]) - Abnormal; Notable for the following:    Color, Urine AMBER (*)    APPearance CLOUDY (*)    Bilirubin Urine MODERATE (*)    Leukocytes, UA SMALL (*)    All other components within normal limits  URINE MICROSCOPIC-ADD ON - Abnormal; Notable for the following:    Squamous Epithelial / LPF 0-5 (*)    Bacteria, UA MANY (*)    All other components within normal limits  LIPASE, BLOOD  BRAIN NATRIURETIC PEPTIDE    EKG  EKG Interpretation None       Radiology No results found.  Procedures Procedures (including critical care time)  Medications Ordered in ED Medications  fentaNYL (SUBLIMAZE) injection 50 mcg (50 mcg Intravenous Given 06/30/16 2309)     Initial Impression / Assessment and Plan / ED Course  I have reviewed the triage vital signs and  the nursing notes.  Pertinent labs & imaging results that were available during my care of the patient were reviewed by me and considered in my medical decision making (see chart for details).  Clinical Course    Multi lab abnormalities,  Acute dehydration.  Pt given If fluids. fentynl for pain.  Pt request oral fluid.  Pt tolerating small amounts.  Pt's notes reviewed from previous visits.  Pt hospitalized earlier this month. Pt has been seen by Dr. Carlean Purl in the past.  Final Clinical Impressions(s) / ED Diagnoses   Final diagnoses:  Generalized abdominal pain  UTI (lower urinary tract infection)  Dehydration  Other specified hypotension  Bilateral lower extremity edema  Ascites    New Prescriptions New Prescriptions   No medications on file     Fransico Meadow, PA-C 07/01/16 [place], PA-C 07/01/16 Rafael Hernandez, PA-C 07/01/16 0157    Duffy Bruce, MD 07/01/16 1225

## 2016-07-01 NOTE — ED Notes (Signed)
Patient transported to Ultrasound at this time via ED stretcher.  Pt in no apparent distress at this time.

## 2016-07-01 NOTE — Progress Notes (Signed)
Pt CBG 75. Pt refusing to eat and drink at this time. MD paged to see if wanted to change fluids from NS. Order received for 1amp D50 to be given. Order followed. Will continue to monitor closely.

## 2016-07-02 DIAGNOSIS — R7989 Other specified abnormal findings of blood chemistry: Secondary | ICD-10-CM

## 2016-07-02 LAB — COMPREHENSIVE METABOLIC PANEL
ALBUMIN: 2.5 g/dL — AB (ref 3.5–5.0)
ALT: 19 U/L (ref 14–54)
ANION GAP: 7 (ref 5–15)
AST: 54 U/L — ABNORMAL HIGH (ref 15–41)
Alkaline Phosphatase: 90 U/L (ref 38–126)
BILIRUBIN TOTAL: 5.2 mg/dL — AB (ref 0.3–1.2)
BUN: <5 mg/dL — ABNORMAL LOW (ref 6–20)
CO2: 18 mmol/L — ABNORMAL LOW (ref 22–32)
CREATININE: 0.77 mg/dL (ref 0.44–1.00)
Calcium: 7 mg/dL — ABNORMAL LOW (ref 8.9–10.3)
Chloride: 117 mmol/L — ABNORMAL HIGH (ref 101–111)
GFR calc Af Amer: >60 mL/min (ref >60)
GLUCOSE: 89 mg/dL (ref 65–99)
Potassium: 3.7 mmol/L (ref 3.5–5.1)
Sodium: 142 mmol/L (ref 135–145)
TOTAL PROTEIN: 5.1 g/dL — AB (ref 6.5–8.1)

## 2016-07-02 LAB — PREPARE RBC (CROSSMATCH)

## 2016-07-02 LAB — CBC
HEMATOCRIT: 19.2 % — AB (ref 36.0–46.0)
Hemoglobin: 6.7 g/dL — CL (ref 12.0–15.0)
MCH: 23.8 pg — ABNORMAL LOW (ref 26.0–34.0)
MCHC: 34.9 g/dL (ref 30.0–36.0)
MCV: 68.3 fL — ABNORMAL LOW (ref 78.0–100.0)
Platelets: 111 10*3/uL — ABNORMAL LOW (ref 150–400)
RBC: 2.81 MIL/uL — ABNORMAL LOW (ref 3.87–5.11)
RDW: 20.5 % — ABNORMAL HIGH (ref 11.5–15.5)
WBC: 4.9 10*3/uL (ref 4.0–10.5)

## 2016-07-02 LAB — MAGNESIUM: Magnesium: 1.2 mg/dL — ABNORMAL LOW (ref 1.7–2.4)

## 2016-07-02 LAB — GLUCOSE, CAPILLARY: Glucose-Capillary: 81 mg/dL (ref 65–99)

## 2016-07-02 LAB — LACTIC ACID, PLASMA: Lactic Acid, Venous: 1.4 mmol/L (ref 0.5–1.9)

## 2016-07-02 LAB — HEPATITIS PANEL, ACUTE
HEP A IGM: NEGATIVE
HEP B C IGM: NEGATIVE
HEP B S AG: NEGATIVE

## 2016-07-02 LAB — PROCALCITONIN: PROCALCITONIN: 0.39 ng/mL

## 2016-07-02 MED ORDER — SODIUM CHLORIDE 0.9 % IV SOLN
Freq: Once | INTRAVENOUS | Status: AC
  Administered 2016-07-02: 06:00:00 via INTRAVENOUS

## 2016-07-02 MED ORDER — MAGNESIUM SULFATE 4 GM/100ML IV SOLN
4.0000 g | Freq: Once | INTRAVENOUS | Status: AC
  Administered 2016-07-02: 4 g via INTRAVENOUS
  Filled 2016-07-02: qty 100

## 2016-07-02 NOTE — Progress Notes (Signed)
PROGRESS NOTE                                                                                                                                                                                                             Patient Demographics:    [name] Foley, is a 70 y.o. female, DOB - 11-12-1945, ZOX:[id number]  Admit date - 06/30/2016   Admitting Physician Lorretta Harp, MD  Outpatient Primary MD for the patient is Dorrene German, MD  LOS - 1  Chief Complaint  Patient presents with  . Abdominal Pain  . Leg Swelling       Brief Narrative    [name] Bonito is a 70 y.o. female with medical history significant of hypertension, hyperlipidemia, GERD, depression, anxiety, CAD, MI, PAD, chronic pancreatitis, alcohol abuse, tobacco abuse, who presents with abdominal pain, diarrhea, dysuria.  Patient was recently hospitalized from 8/29-8/30 due to diarrhea and hypotension. Patient had negative C. difficile PCR test on 06/22/16. Patient states that she has abdominal pain in the past several days. She also has abdominal distention. Her abdominal pain is diffuse, moderate, nonradiating. It is not aggravated or alleviated by any factors. She has nausea, but no vomiting. She has 2-3 bowel movement with loose stool each day. Patient does not have fever or chills. She also reports dysuria and burning on urination. She denies chest pain, shortness of breath, cough, unilateral weakness.  ED Course: pt was found to have hypotension with blood pressure 89/69 which improves with IV fluid resuscitation. WBC 9.1, lactate 3.64, BNP 46.4, lipase 11, posterior urinalysis with small amount of leukocytes, abnormal liver function with ALP 162, all between 1.8, AST 88, ALT 31, total bilirubin 5.5. AKI with Cre 1.35, sodium 131, potassium 4.4, bicarbonate 14, anion gap 11. Pt is admitted to stepdown bed for further eval and treatment. PCCM was consulted by  EDP, recommended fluids resuscitation.    Subjective:    [name] Ramanathan today has, No headache, No chest pain, Mild generalized abdominal pain - No Nausea, No new weakness tingling or numbness, No Cough - SOB.     Assessment  & Plan :     1.Generalized abdominal pain with hypotension. Possible sepsis versus severe dehydration. Etiology unclear, has minimal ascites and UTI. But afebrile and without leukocytosis. Currently Rocephin for UTI , Paracentesis has ruled out SBP,  and acute CT scan abdomen and pelvis to rule out bowel ischemia or colitis, he is clinically much better will advance diet. Continue IV fluids for hydration. Lactate has stabilized. If stable follow with her primary gastroenterologist Dr. Leone Payor post discharge.  2. History of chronic pancreatitis. Lipase is stable, CT scan as above has been ordered, counseled to quit alcohol, continue pancreatic enzymes with meals.  3. UTI. On Rocephin and continue, monitor cultures.  4. Diet-controlled type 2 diabetes mellitus we'll continue to monitor.  5. Dehydration causing hyponatremia. Improved with IV fluids.  6. Chronic iron deficiency anemia with acute fall due to IV fluids causing dilution. Stable anemia panel, on PPI, transfuse 1 unit on 07/02/2016 and monitor.  7. CAD. No acute issues chest pain-free. On aspirin and Plavix for secondary prevention. Blood pressure too low for beta blocker, statins if liver enzymes and otherwise stable.  8. Leg swelling and ascites. Due to cirrhosis and hypoalbuminemia. Supportive care. Diurese once pressure is better.  9. History of alcohol abuse. Counseled to quit, she says last drink was 2 weeks ago, monitor on CIWA protocol.  10. Chronic diarrhea. Ruled out C. difficile, Imodium and Creon to continue, continue supportive care.  11.Hypokalemia and hypomagnesemia. Replaced will monitor.   Psych called upon the request of Adult Protective Services RN in the outpatient setting. Patient  apparently being followed by Adult Protective Services for self-neglect. Psych has been requested to see the patient.    Family Communication  :  None  Code Status :  Full  Diet : Clears  Disposition Plan  :  Step down  Consults  :  Psych called upon the request of Adult Protective Services RN in the outpatient setting.  Procedures  :    Abdominal ultrasound. Small ascites and cirrhosis  CT abdomen and pelvis ordered.  DVT Prophylaxis  :   SCDs    Lab Results  Component Value Date   PLT 111 (L) 07/02/2016    Inpatient Medications  Scheduled Meds: . aspirin EC  81 mg Oral Daily  . cefTRIAXone (ROCEPHIN)  IV  2 g Intravenous Q24H  . clonazePAM  1 mg Oral QHS  . clopidogrel  75 mg Oral QPM  . ferrous gluconate  324 mg Oral Q breakfast  . folic acid  1 mg Oral Daily  . lipase/protease/amylase  72,000 Units Oral TID WC  . LORazepam  0-4 mg Intravenous Q6H   Followed by  . [START ON 07/03/2016] LORazepam  0-4 mg Intravenous Q12H  . magnesium sulfate 1 - 4 g bolus IVPB  4 g Intravenous Once  . mirtazapine  15 mg Oral QHS  . multivitamin with minerals  1 tablet Oral Daily  . pantoprazole  40 mg Oral Daily  . QUEtiapine  50 mg Oral QHS  . sodium chloride flush  3 mL Intravenous Q12H  . thiamine  100 mg Oral Daily   Or  . thiamine  100 mg Intravenous Daily   Continuous Infusions: . sodium chloride 100 mL/hr at 07/02/16 0900   PRN Meds:.lipase/protease/amylase, loperamide, LORazepam **OR** LORazepam, morphine injection, nitroGLYCERIN, ondansetron **OR** ondansetron (ZOFRAN) IV, traMADol  Antibiotics  :    Anti-infectives    Start     Dose/Rate Route Frequency Ordered Stop   07/02/16 0000  cefTRIAXone (ROCEPHIN) 2 g in dextrose 5 % 50 mL IVPB     2 g 100 mL/hr over 30 Minutes Intravenous Every 24 hours 07/01/16 0334     07/01/16 0130  cefTRIAXone (ROCEPHIN)  2 g in dextrose 5 % 50 mL IVPB     2 g 100 mL/hr over 30 Minutes Intravenous  Once 07/01/16 0115 07/01/16 0202          Objective:   Vitals:   07/02/16 0600 07/02/16 0615 07/02/16 0700 07/02/16 0815  BP: 105/68 110/75    Pulse: (!) 106 (!) 104    Resp: (!) 25 (!) 31    Temp:  97.4 F (36.3 C) 97.4 F (36.3 C) 97.7 F (36.5 C)  TempSrc:  Oral Oral Oral  SpO2: 99% 100%    Weight:      Height:        Wt Readings from Last 3 Encounters:  07/02/16 64.4 kg (141 lb 15.6 oz)  06/22/16 61.6 kg (135 lb 12.9 oz)  06/15/16 62.6 kg (138 lb 0.1 oz)     Intake/Output Summary (Last 24 hours) at 07/02/16 0945 Last data filed at 07/02/16 0900  Gross per 24 hour  Intake          3822.58 ml  Output             1675 ml  Net          2147.58 ml     Physical Exam  Awake Alert, Oriented X 3, No new F.N deficits, Normal affect [place].AT,PERRAL Supple Neck,No JVD, No cervical lymphadenopathy appriciated.  Symmetrical Chest wall movement, Good air movement bilaterally, CTAB RRR,No Gallops,Rubs or new Murmurs, No Parasternal Heave +ve B.Sounds, Abd Soft but mildly distended, No tenderness, No organomegaly appriciated, No rebound - guarding or rigidity. No Cyanosis, Clubbing or edema, No new Rash or bruise       Data Review:    CBC  Recent Labs Lab 06/30/16 1823 07/01/16 0411 07/02/16 0250  WBC 9.1 5.4 4.9  HGB 10.1* 7.4* 6.7*  HCT 29.6* 21.7* 19.2*  PLT 265 150 111*  MCV 68.7* 68.0* 68.3*  MCH 23.4* 23.2* 23.8*  MCHC 34.1 34.1 34.9  RDW 20.9* 20.6* 20.5*  LYMPHSABS 2.5  --   --   MONOABS 1.1*  --   --   EOSABS 0.1  --   --   BASOSABS 0.0  --   --     Chemistries   Recent Labs Lab 06/30/16 1823 07/01/16 0411 07/01/16 1100 07/01/16 2128 07/02/16 0250  NA 131* 135  --   --  142  K 4.4 2.8*  --  4.4 3.7  CL 105 108  --   --  117*  CO2 15* 17*  --   --  18*  GLUCOSE 129* 79  --   --  89  BUN 11 8  --   --  <5*  CREATININE 1.35* 1.05*  --   --  0.77  CALCIUM 7.6* 7.1*  --   --  7.0*  MG  --   --  1.2*  --  1.2*  AST 88* 58*  --   --  54*  ALT 31 23  --   --  19  ALKPHOS  162* 117  --   --  90  BILITOT 5.5* 4.3*  --   --  5.2*   ------------------------------------------------------------------------------------------------------------------ No results for input(s): CHOL, HDL, LDLCALC, TRIG, CHOLHDL, LDLDIRECT in the last 72 hours.  Lab Results  Component Value Date   HGBA1C 6.0 (H) 08/11/2012   ------------------------------------------------------------------------------------------------------------------ No results for input(s): TSH, T4TOTAL, T3FREE, THYROIDAB in the last 72 hours.  Invalid input(s): FREET3 ------------------------------------------------------------------------------------------------------------------  Recent Labs  07/01/16 1100  VITAMINB12 1,122*  FOLATE 12.6  FERRITIN 450*  TIBC NOT CALCULATED  IRON 38  RETICCTPCT 0.7    Coagulation profile  Recent Labs Lab 07/01/16 0411  INR 3.31    No results for input(s): DDIMER in the last 72 hours.  Cardiac Enzymes No results for input(s): CKMB, TROPONINI, MYOGLOBIN in the last 168 hours.  Invalid input(s): CK ------------------------------------------------------------------------------------------------------------------    Component Value Date/Time   BNP 46.4 06/30/2016 1823    Micro Results Recent Results (from the past 240 hour(s))  Culture, blood (x 2)     Status: None (Preliminary result)   Collection Time: 07/01/16  4:01 AM  Result Value Ref Range Status   Specimen Description BLOOD RIGHT HAND  Final   Special Requests IN PEDIATRIC BOTTLE  Final   Culture NO GROWTH 1 DAY  Final   Report Status PENDING  Incomplete  Culture, blood (x 2)     Status: None (Preliminary result)   Collection Time: 07/01/16  4:11 AM  Result Value Ref Range Status   Specimen Description BLOOD LEFT FOREARM  Final   Special Requests BOTTLES DRAWN AEROBIC AND ANAEROBIC  Final   Culture NO GROWTH 1 DAY  Final   Report Status PENDING  Incomplete  MRSA PCR Screening      Status: None   Collection Time: 07/01/16  7:56 AM  Result Value Ref Range Status   MRSA by PCR NEGATIVE NEGATIVE Final    Comment:        The GeneXpert MRSA Assay (FDA approved for NASAL specimens only), is one component of a comprehensive MRSA colonization surveillance program. It is not intended to diagnose MRSA infection nor to guide or monitor treatment for MRSA infections.   Gastrointestinal Panel by PCR , Stool     Status: None   Collection Time: 07/01/16 12:11 PM  Result Value Ref Range Status   Campylobacter species NOT DETECTED NOT DETECTED Final   Plesimonas shigelloides NOT DETECTED NOT DETECTED Final   Salmonella species NOT DETECTED NOT DETECTED Final   Yersinia enterocolitica NOT DETECTED NOT DETECTED Final   Vibrio species NOT DETECTED NOT DETECTED Final   Vibrio cholerae NOT DETECTED NOT DETECTED Final   Enteroaggregative E coli (EAEC) NOT DETECTED NOT DETECTED Final   Enteropathogenic E coli (EPEC) NOT DETECTED NOT DETECTED Final   Enterotoxigenic E coli (ETEC) NOT DETECTED NOT DETECTED Final   Shiga like toxin producing E coli (STEC) NOT DETECTED NOT DETECTED Final   Shigella/Enteroinvasive E coli (EIEC) NOT DETECTED NOT DETECTED Final   Cryptosporidium NOT DETECTED NOT DETECTED Final   Cyclospora cayetanensis NOT DETECTED NOT DETECTED Final   Entamoeba histolytica NOT DETECTED NOT DETECTED Final   Giardia lamblia NOT DETECTED NOT DETECTED Final   Adenovirus F40/41 NOT DETECTED NOT DETECTED Final   Astrovirus NOT DETECTED NOT DETECTED Final   Norovirus GI/GII NOT DETECTED NOT DETECTED Final   Rotavirus A NOT DETECTED NOT DETECTED Final   Sapovirus (I, II, IV, and V) NOT DETECTED NOT DETECTED Final  C difficile quick screen w PCR reflex     Status: None   Collection Time: 07/01/16 12:11 PM  Result Value Ref Range Status   C Diff antigen NEGATIVE NEGATIVE Final   C Diff toxin NEGATIVE NEGATIVE Final   C Diff interpretation No C. difficile detected.  Final    Gram stain     Status: None   Collection Time: 07/01/16  2:38 PM  Result Value Ref Range Status   Specimen  Description PERITONEAL  Final   Special Requests NONE  Final   Gram Stain   Final    WBC PRESENT, PREDOMINANTLY MONONUCLEAR NO ORGANISMS SEEN CYTOSPIN SMEAR    Report Status 07/01/2016 FINAL  Final  Culture, body fluid-bottle     Status: None (Preliminary result)   Collection Time: 07/01/16  2:38 PM  Result Value Ref Range Status   Specimen Description PERITONEAL  Final   Special Requests NONE  Final   Culture NO GROWTH < 24 HOURS  Final   Report Status PENDING  Incomplete    Radiology Reports  Dg Chest 2 View  Result Date: 06/14/2016 CLINICAL DATA:  Bilateral ankle edema, cough EXAM: CHEST  2 VIEW COMPARISON:  05/16/2016 FINDINGS: There is no focal parenchymal opacity. There is no pleural effusion or pneumothorax. The heart and mediastinal contours are unremarkable. The osseous structures are unremarkable. IMPRESSION: No active cardiopulmonary disease. Electronically Signed   By: Elige Ko   On: 06/14/2016 17:03   US Abdomen Complete  Result Date: 07/01/2016 CLINICAL DATA:  Acute onset of upper abdominal pain and abdominal distention. Initial encounter. EXAM: ABDOMEN ULTRASOUND COMPLETE COMPARISON:  CT of the abdomen and pelvis, and right upper quadrant ultrasound, performed 06/08/2016 FINDINGS: Gallbladder: Status post cholecystectomy.  No retained stones seen. Common bile duct: Diameter: 0.8 cm, within normal limits in caliber status post cholecystectomy. Liver: No focal lesion identified. There is diffuse heterogeneity within the liver, with increased parenchymal echogenicity, likely reflecting mild hepatic cirrhosis. The liver is mildly enlarged. IVC: No abnormality visualized. Pancreas: Visualized portion unremarkable. Spleen: Size and appearance within normal limits. Right Kidney: Length: 9.7 cm. Echogenicity within normal limits. No mass or hydronephrosis visualized. Left  Kidney: Length: 10.2 cm. Echogenicity within normal limits. No mass or hydronephrosis visualized. Abdominal aorta: No aneurysm visualized. Not fully characterized due to overlying bowel gas. Other findings: Small to moderate volume ascites is noted within the abdomen. IMPRESSION: 1. Small to moderate volume ascites within the abdomen. 2. Diffuse heterogeneity within the liver, with increased parenchymal echogenicity, likely reflecting mild hepatic cirrhosis. Mild hepatomegaly. 3. Status post cholecystectomy. Electronically Signed   By: Roanna Raider M.D.   On: 07/01/2016 05:22   Time Spent in minutes  30   Kilo Eshelman K M.D on 07/02/2016 at 9:45 AM  Between 7am to 7pm - Pager - [phone]  After 7pm go to [email or web address] - password [facility]  [facility] -  Office  [phone]

## 2016-07-02 NOTE — Progress Notes (Signed)
CRITICAL VALUE ALERT  Critical value received:  Hgb 6.7  Date of notification:  07/02/2016  Time of notification:  [id number]  Critical value read back:Yes.    Nurse who received alert:  Marisue Brooklyn  MD notified (1st page):  Walden Field  Time of first page:  [phone]  MD notified (2nd page):  Time of second page:  Responding MD:  Walden Field  Time MD responded:  [phone]

## 2016-07-02 NOTE — Evaluation (Signed)
Physical Therapy Evaluation Patient Details Name: [name] Foley MRN: [id number] DOB: 1946-09-25 Today's Date: 07/02/2016   History of Present Illness  Patient is a 70 yo female admitted 06/30/16 with abdominal pain and weakness.  Patient with dehydration, UTI, hypotension, anemia.    PMH:  HTN, HLD, depression, anxiety, CAD, MI, PAD, ETOH abuse, chronic pancreatitis, tobacco use  Clinical Impression  Patient presents with problems listed below.  Will benefit from acute PT to maximize functional mobility prior to discharge.  Recommend ST-SNF at d/c for continued therapy to reach Mod I level prior to return home.    Follow Up Recommendations SNF;Supervision for mobility/OOB    Equipment Recommendations  None recommended by PT    Recommendations for Other Services       Precautions / Restrictions Precautions Precautions: Fall Restrictions Weight Bearing Restrictions: No      Mobility  Bed Mobility Overal bed mobility: Modified Independent             General bed mobility comments: Patient able to move to sitting with no physical assist.  Required increased time for mobility.  Patient sat EOB x 6 minutes performing LE exercises.  Patient declined further mobility today.  Returned to supine.  Transfers                 General transfer comment: Declined  Ambulation/Gait                Stairs            Wheelchair Mobility    Modified Rankin (Stroke Patients Only)       Balance   Sitting-balance support: No upper extremity supported;Feet supported Sitting balance-Leahy Scale: Good                                       Pertinent Vitals/Pain Pain Assessment: Faces Faces Pain Scale: Hurts little more Pain Location: Stomach Pain Descriptors / Indicators: Grimacing;Guarding Pain Intervention(s): Limited activity within patient's tolerance;Monitored during session;Repositioned    Home Living Family/patient expects to be  discharged to:: Skilled nursing facility Living Arrangements: Alone             Home Equipment: Kasandra Knudsen - single point      Prior Function Level of Independence: Independent with assistive device(s)         Comments: Uses cane occasionally     Hand Dominance        Extremity/Trunk Assessment   Upper Extremity Assessment: Generalized weakness           Lower Extremity Assessment: Generalized weakness      Cervical / Trunk Assessment: Normal  Communication   Communication: No difficulties  Cognition Arousal/Alertness: Awake/alert Behavior During Therapy: WFL for tasks assessed/performed;Flat affect Overall Cognitive Status: Within Functional Limits for tasks assessed                      General Comments      Exercises General Exercises - Lower Extremity Ankle Circles/Pumps: AROM;Both;10 reps;Seated Long Arc Quad: AROM;Both;10 reps;Seated      Assessment/Plan    PT Assessment Patient needs continued PT services  PT Diagnosis Difficulty walking;Generalized weakness;Acute pain   PT Problem List Decreased strength;Decreased activity tolerance;Decreased balance;Decreased mobility;Decreased knowledge of use of DME;Pain  PT Treatment Interventions DME instruction;Gait training;Functional mobility training;Therapeutic activities;Therapeutic exercise;Patient/family education   PT Goals (Current goals can be found in the Care  Plan section) Acute Rehab PT Goals Patient Stated Goal: None stated today PT Goal Formulation: With patient Time For Goal Achievement: 07/09/16 Potential to Achieve Goals: Good    Frequency Min 3X/week   Barriers to discharge Decreased caregiver support Patient lives alone.    Co-evaluation               End of Session   Activity Tolerance: Patient limited by fatigue;Patient limited by pain Patient left: in bed;with call bell/phone within reach;with bed alarm set Nurse Communication: Mobility status          Time: [id number] PT Time Calculation (min) (ACUTE ONLY): 10 min   Charges:   PT Evaluation $PT Eval Moderate Complexity: 1 Procedure     PT G CodesDespina Pole 07/30/16, 5:38 PM Carita Pian. Sanjuana Kava, [place] Pager [phone]

## 2016-07-03 DIAGNOSIS — F1010 Alcohol abuse, uncomplicated: Secondary | ICD-10-CM

## 2016-07-03 DIAGNOSIS — N179 Acute kidney failure, unspecified: Secondary | ICD-10-CM

## 2016-07-03 LAB — COMPREHENSIVE METABOLIC PANEL
ALBUMIN: 2.2 g/dL — AB (ref 3.5–5.0)
ALK PHOS: 93 U/L (ref 38–126)
ALT: 23 U/L (ref 14–54)
AST: 63 U/L — ABNORMAL HIGH (ref 15–41)
Anion gap: 12 (ref 5–15)
BUN: <5 mg/dL — ABNORMAL LOW (ref 6–20)
CHLORIDE: 117 mmol/L — AB (ref 101–111)
CO2: 14 mmol/L — AB (ref 22–32)
CREATININE: 0.77 mg/dL (ref 0.44–1.00)
Calcium: 7.5 mg/dL — ABNORMAL LOW (ref 8.9–10.3)
GFR calc non Af Amer: >60 mL/min (ref >60)
GLUCOSE: 79 mg/dL (ref 65–99)
Potassium: 4 mmol/L (ref 3.5–5.1)
SODIUM: 143 mmol/L (ref 135–145)
Total Bilirubin: 5.2 mg/dL — ABNORMAL HIGH (ref 0.3–1.2)
Total Protein: 5.4 g/dL — ABNORMAL LOW (ref 6.5–8.1)

## 2016-07-03 LAB — TYPE AND SCREEN
ABO/RH(D): A POS
ANTIBODY SCREEN: NEGATIVE
Unit division: 0

## 2016-07-03 LAB — PROCALCITONIN: PROCALCITONIN: 0.42 ng/mL

## 2016-07-03 LAB — CBC
HCT: 24.1 % — ABNORMAL LOW (ref 36.0–46.0)
HEMOGLOBIN: 8.2 g/dL — AB (ref 12.0–15.0)
MCH: 24.4 pg — AB (ref 26.0–34.0)
MCHC: 34 g/dL (ref 30.0–36.0)
MCV: 71.7 fL — ABNORMAL LOW (ref 78.0–100.0)
PLATELETS: 107 10*3/uL — AB (ref 150–400)
RBC: 3.36 MIL/uL — AB (ref 3.87–5.11)
RDW: 21.1 % — ABNORMAL HIGH (ref 11.5–15.5)
WBC: 5.4 10*3/uL (ref 4.0–10.5)

## 2016-07-03 LAB — URINE CULTURE: Culture: >=100000 — AB

## 2016-07-03 LAB — MAGNESIUM: Magnesium: 2.1 mg/dL (ref 1.7–2.4)

## 2016-07-03 MED ORDER — LOPERAMIDE HCL 2 MG PO CAPS
2.0000 mg | ORAL_CAPSULE | ORAL | Status: DC
  Administered 2016-07-03 (×2): 2 mg via ORAL
  Filled 2016-07-03 (×2): qty 1

## 2016-07-03 MED ORDER — MAGNESIUM SULFATE IN D5W 1-5 GM/100ML-% IV SOLN
1.0000 g | Freq: Once | INTRAVENOUS | Status: AC
  Administered 2016-07-03: 1 g via INTRAVENOUS
  Filled 2016-07-03: qty 100

## 2016-07-03 MED ORDER — FOSFOMYCIN TROMETHAMINE 3 G PO PACK
3.0000 g | PACK | Freq: Once | ORAL | Status: AC
  Administered 2016-07-03: 3 g via ORAL
  Filled 2016-07-03: qty 3

## 2016-07-03 NOTE — Progress Notes (Signed)
PROGRESS NOTE                                                                                                                                                                                                             Patient Demographics:    [name] Foley, is a 70 y.o. female, DOB - [birth year], ZOX:[id number]  Admit date - 06/30/2016   Admitting Physician Lorretta Harp, MD  Outpatient Primary MD for the patient is Dorrene German, MD  LOS - 2  Chief Complaint  Patient presents with  . Abdominal Pain  . Leg Swelling       Brief Narrative    [name] Tietje is a 70 y.o. female with medical history significant of hypertension, hyperlipidemia, GERD, depression, anxiety, CAD, MI, PAD, chronic pancreatitis, alcohol abuse, tobacco abuse, who presents with abdominal pain, diarrhea, dysuria.  Patient was recently hospitalized from 8/29-8/30 due to diarrhea and hypotension. Patient had negative C. difficile PCR test on 06/22/16. Patient states that she has abdominal pain in the past several days. She also has abdominal distention. Her abdominal pain is diffuse, moderate, nonradiating. It is not aggravated or alleviated by any factors. She has nausea, but no vomiting. She has 2-3 bowel movement with loose stool each day. Patient does not have fever or chills. She also reports dysuria and burning on urination. She denies chest pain, shortness of breath, cough, unilateral weakness.  ED Course: pt was found to have hypotension with blood pressure 89/69 which improves with IV fluid resuscitation. WBC 9.1, lactate 3.64, BNP 46.4, lipase 11, posterior urinalysis with small amount of leukocytes, abnormal liver function with ALP 162, all between 1.8, AST 88, ALT 31, total bilirubin 5.5. AKI with Cre 1.35, sodium 131, potassium 4.4, bicarbonate 14, anion gap 11. Pt is admitted to stepdown bed for further eval and treatment. PCCM was consulted by  EDP, recommended fluids resuscitation.    Subjective:    [name] Tomson today has, No headache, No chest pain, Mild generalized abdominal pain - No Nausea, No new weakness tingling or numbness, No Cough - SOB.      Assessment  & Plan :     1.Generalized abdominal pain with hypotension. Possible sepsis versus severe dehydration. Etiology unclear, has minimal ascites and UTI. But afebrile and without leukocytosis. Currently Rocephin for UTI , Paracentesis has ruled out  SBP, and acute CT scan abdomen and pelvis to rule out bowel ischemia or colitis, he is clinically much better will advance diet. Continue IV fluids for hydration. Lactate has stabilized. If stable follow with her primary gastroenterologist Dr. Leone Payor post discharge.  2. History of chronic pancreatitis. Lipase is stable, CT scan as above has been ordered, counseled to quit alcohol, continue pancreatic enzymes with meals.  3. UTI. On Rocephin and continue, monitor cultures.  4. Diet-controlled type 2 diabetes mellitus we'll continue to monitor.  5. Dehydration causing hyponatremia. Improved with IV fluids.  6. Chronic iron deficiency anemia with acute fall due to IV fluids causing dilution. Stable anemia panel, on PPI, transfused 1 unit on 07/02/2016, post transfusion H&H remain stable we'll continue to monitor.  7. CAD. No acute issues chest pain-free. On aspirin and Plavix for secondary prevention. Blood pressure too low for beta blocker, statins if liver enzymes and otherwise stable.  8. Leg swelling and ascites. Due to cirrhosis and hypoalbuminemia. Supportive care. Diurese once pressure is better.  9. History of alcohol abuse. Counseled to quit, she says last drink was 2 weeks ago, monitor on CIWA protocol.  10. Chronic diarrhea. Ruled out C. difficile, Imodium and Creon to continue, continue supportive care.  11.Hypokalemia and hypomagnesemia. Replaced will monitor.  12. Normal  anion gap metabolic acidosis due to  diarrhea. Treat diarrhea.   Psych called upon the request of Adult Protective Services RN in the outpatient setting. Patient apparently being followed by Adult Protective Services for self-neglect. Psych has been requested to see the patient on 07-01-16.    Family Communication  :  None  Code Status :  Full  Diet : Soft  Disposition Plan  :  DC in 1-2 days  Consults  :  Psych called upon the request of Adult Protective Services RN in the outpatient setting.  Procedures  :    Abdominal ultrasound. Small ascites and cirrhosis  CT abdomen and pelvis - Non acute  Ultrasound-guided paracentesis. No SBP.  DVT Prophylaxis  :   SCDs    Lab Results  Component Value Date   PLT 107 (L) 07/03/2016    Inpatient Medications  Scheduled Meds: . aspirin EC  81 mg Oral Daily  . cefTRIAXone (ROCEPHIN)  IV  2 g Intravenous Q24H  . clonazePAM  1 mg Oral QHS  . clopidogrel  75 mg Oral QPM  . ferrous gluconate  324 mg Oral Q breakfast  . folic acid  1 mg Oral Daily  . lipase/protease/amylase  72,000 Units Oral TID WC  . loperamide  2 mg Oral Q4H  . LORazepam  0-4 mg Intravenous Q12H  . mirtazapine  15 mg Oral QHS  . multivitamin with minerals  1 tablet Oral Daily  . pantoprazole  40 mg Oral Daily  . QUEtiapine  50 mg Oral QHS  . sodium chloride flush  3 mL Intravenous Q12H  . thiamine  100 mg Oral Daily   Or  . thiamine  100 mg Intravenous Daily   Continuous Infusions:   PRN Meds:.lipase/protease/amylase, loperamide, LORazepam **OR** LORazepam, morphine injection, nitroGLYCERIN, ondansetron **OR** ondansetron (ZOFRAN) IV, traMADol  Antibiotics  :    Anti-infectives    Start     Dose/Rate Route Frequency Ordered Stop   07/02/16 0000  cefTRIAXone (ROCEPHIN) 2 g in dextrose 5 % 50 mL IVPB     2 g 100 mL/hr over 30 Minutes Intravenous Every 24 hours 07/01/16 0334     07/01/16 0130  cefTRIAXone (ROCEPHIN) 2 g in dextrose 5 % 50 mL IVPB     2 g 100 mL/hr over 30 Minutes Intravenous   Once 07/01/16 0115 07/01/16 0202         Objective:   Vitals:   07/02/16 2101 07/03/16 0015 07/03/16 0430 07/03/16 0850  BP: 116/66 102/70 118/77 111/77  Pulse: (!) 103 (!) 103 (!) 112 (!) 108  Resp: 18 18 16 18   Temp: 99.9 F (37.7 C) 99.6 F (37.6 C) 99.2 F (37.3 C)   TempSrc: Oral Oral Oral   SpO2: 93% 96% 97% 96%  Weight:   65.8 kg (145 lb)   Height:        Wt Readings from Last 3 Encounters:  07/03/16 65.8 kg (145 lb)  06/22/16 61.6 kg (135 lb 12.9 oz)  06/15/16 62.6 kg (138 lb 0.1 oz)     Intake/Output Summary (Last 24 hours) at 07/03/16 0941 Last data filed at 07/03/16 0437  Gross per 24 hour  Intake              120 ml  Output              300 ml  Net             -180 ml     Physical Exam  Awake Alert, Oriented X 3, No new F.N deficits, Normal affect [place].AT,PERRAL Supple Neck,No JVD, No cervical lymphadenopathy appriciated.  Symmetrical Chest wall movement, Good air movement bilaterally, CTAB RRR,No Gallops,Rubs or new Murmurs, No Parasternal Heave +ve B.Sounds, Abd Soft but mildly distended, No tenderness, No organomegaly appriciated, No rebound - guarding or rigidity. No Cyanosis, Clubbing or edema, No new Rash or bruise       Data Review:    CBC  Recent Labs Lab 06/30/16 1823 07/01/16 0411 07/02/16 0250 07/03/16 0258  WBC 9.1 5.4 4.9 5.4  HGB 10.1* 7.4* 6.7* 8.2*  HCT 29.6* 21.7* 19.2* 24.1*  PLT 265 150 111* 107*  MCV 68.7* 68.0* 68.3* 71.7*  MCH 23.4* 23.2* 23.8* 24.4*  MCHC 34.1 34.1 34.9 34.0  RDW 20.9* 20.6* 20.5* 21.1*  LYMPHSABS 2.5  --   --   --   MONOABS 1.1*  --   --   --   EOSABS 0.1  --   --   --   BASOSABS 0.0  --   --   --     Chemistries   Recent Labs Lab 06/30/16 1823 07/01/16 0411 07/01/16 1100 07/01/16 2128 07/02/16 0250 07/03/16 0258  NA 131* 135  --   --  142 143  K 4.4 2.8*  --  4.4 3.7 4.0  CL 105 108  --   --  117* 117*  CO2 15* 17*  --   --  18* 14*  GLUCOSE 129* 79  --   --  89 79  BUN 11 8   --   --  <5* <5*  CREATININE 1.35* 1.05*  --   --  0.77 0.77  CALCIUM 7.6* 7.1*  --   --  7.0* 7.5*  MG  --   --  1.2*  --  1.2* 2.1  AST 88* 58*  --   --  54* 63*  ALT 31 23  --   --  19 23  ALKPHOS 162* 117  --   --  90 93  BILITOT 5.5* 4.3*  --   --  5.2* 5.2*   ------------------------------------------------------------------------------------------------------------------ No results for input(s): CHOL, HDL, LDLCALC,  TRIG, CHOLHDL, LDLDIRECT in the last 72 hours.  Lab Results  Component Value Date   HGBA1C 6.0 (H) 08/11/2012   ------------------------------------------------------------------------------------------------------------------ No results for input(s): TSH, T4TOTAL, T3FREE, THYROIDAB in the last 72 hours.  Invalid input(s): FREET3 ------------------------------------------------------------------------------------------------------------------  Recent Labs  07/01/16 1100  VITAMINB12 1,122*  FOLATE 12.6  FERRITIN 450*  TIBC NOT CALCULATED  IRON 38  RETICCTPCT 0.7    Coagulation profile  Recent Labs Lab 07/01/16 0411  INR 3.31    No results for input(s): DDIMER in the last 72 hours.  Cardiac Enzymes No results for input(s): CKMB, TROPONINI, MYOGLOBIN in the last 168 hours.  Invalid input(s): CK ------------------------------------------------------------------------------------------------------------------    Component Value Date/Time   BNP 46.4 06/30/2016 1823    Micro Results Recent Results (from the past 240 hour(s))  Urine culture     Status: Abnormal   Collection Time: 06/30/16  8:55 PM  Result Value Ref Range Status   Specimen Description URINE, RANDOM  Final   Special Requests NONE  Final   Culture >=100,000 COLONIES/mL ENTEROCOCCUS FAECIUM (A)  Final   Report Status 07/03/2016 FINAL  Final   Organism ID, Bacteria ENTEROCOCCUS FAECIUM (A)  Final      Susceptibility   Enterococcus faecium - MIC*    AMPICILLIN <=2 SENSITIVE  Sensitive     LEVOFLOXACIN 2 SENSITIVE Sensitive     NITROFURANTOIN 64 INTERMEDIATE Intermediate     VANCOMYCIN <=0.5 SENSITIVE Sensitive     * >=100,000 COLONIES/mL ENTEROCOCCUS FAECIUM  Culture, blood (x 2)     Status: None (Preliminary result)   Collection Time: 07/01/16  4:01 AM  Result Value Ref Range Status   Specimen Description BLOOD RIGHT HAND  Final   Special Requests IN PEDIATRIC BOTTLE  Final   Culture NO GROWTH 1 DAY  Final   Report Status PENDING  Incomplete  Culture, blood (x 2)     Status: None (Preliminary result)   Collection Time: 07/01/16  4:11 AM  Result Value Ref Range Status   Specimen Description BLOOD LEFT FOREARM  Final   Special Requests BOTTLES DRAWN AEROBIC AND ANAEROBIC  Final   Culture NO GROWTH 1 DAY  Final   Report Status PENDING  Incomplete  MRSA PCR Screening     Status: None   Collection Time: 07/01/16  7:56 AM  Result Value Ref Range Status   MRSA by PCR NEGATIVE NEGATIVE Final    Comment:        The GeneXpert MRSA Assay (FDA approved for NASAL specimens only), is one component of a comprehensive MRSA colonization surveillance program. It is not intended to diagnose MRSA infection nor to guide or monitor treatment for MRSA infections.   Gastrointestinal Panel by PCR , Stool     Status: None   Collection Time: 07/01/16 12:11 PM  Result Value Ref Range Status   Campylobacter species NOT DETECTED NOT DETECTED Final   Plesimonas shigelloides NOT DETECTED NOT DETECTED Final   Salmonella species NOT DETECTED NOT DETECTED Final   Yersinia enterocolitica NOT DETECTED NOT DETECTED Final   Vibrio species NOT DETECTED NOT DETECTED Final   Vibrio cholerae NOT DETECTED NOT DETECTED Final   Enteroaggregative E coli (EAEC) NOT DETECTED NOT DETECTED Final   Enteropathogenic E coli (EPEC) NOT DETECTED NOT DETECTED Final   Enterotoxigenic E coli (ETEC) NOT DETECTED NOT DETECTED Final   Shiga like toxin producing E coli (STEC) NOT DETECTED NOT  DETECTED Final   Shigella/Enteroinvasive E coli (EIEC) NOT DETECTED  NOT DETECTED Final   Cryptosporidium NOT DETECTED NOT DETECTED Final   Cyclospora cayetanensis NOT DETECTED NOT DETECTED Final   Entamoeba histolytica NOT DETECTED NOT DETECTED Final   Giardia lamblia NOT DETECTED NOT DETECTED Final   Adenovirus F40/41 NOT DETECTED NOT DETECTED Final   Astrovirus NOT DETECTED NOT DETECTED Final   Norovirus GI/GII NOT DETECTED NOT DETECTED Final   Rotavirus A NOT DETECTED NOT DETECTED Final   Sapovirus (I, II, IV, and V) NOT DETECTED NOT DETECTED Final  C difficile quick screen w PCR reflex     Status: None   Collection Time: 07/01/16 12:11 PM  Result Value Ref Range Status   C Diff antigen NEGATIVE NEGATIVE Final   C Diff toxin NEGATIVE NEGATIVE Final   C Diff interpretation No C. difficile detected.  Final  Gram stain     Status: None   Collection Time: 07/01/16  2:38 PM  Result Value Ref Range Status   Specimen Description PERITONEAL  Final   Special Requests NONE  Final   Gram Stain   Final    WBC PRESENT, PREDOMINANTLY MONONUCLEAR NO ORGANISMS SEEN CYTOSPIN SMEAR    Report Status 07/01/2016 FINAL  Final  Culture, body fluid-bottle     Status: None (Preliminary result)   Collection Time: 07/01/16  2:38 PM  Result Value Ref Range Status   Specimen Description PERITONEAL  Final   Special Requests NONE  Final   Culture NO GROWTH < 24 HOURS  Final   Report Status PENDING  Incomplete    Radiology Reports  Dg Chest 2 View  Result Date: 06/14/2016 CLINICAL DATA:  Bilateral ankle edema, cough EXAM: CHEST  2 VIEW COMPARISON:  05/16/2016 FINDINGS: There is no focal parenchymal opacity. There is no pleural effusion or pneumothorax. The heart and mediastinal contours are unremarkable. The osseous structures are unremarkable. IMPRESSION: No active cardiopulmonary disease. Electronically Signed   By: Elige Ko   On: 06/14/2016 17:03   US Abdomen Complete  Result Date:  07/01/2016 CLINICAL DATA:  Acute onset of upper abdominal pain and abdominal distention. Initial encounter. EXAM: ABDOMEN ULTRASOUND COMPLETE COMPARISON:  CT of the abdomen and pelvis, and right upper quadrant ultrasound, performed 06/08/2016 FINDINGS: Gallbladder: Status post cholecystectomy.  No retained stones seen. Common bile duct: Diameter: 0.8 cm, within normal limits in caliber status post cholecystectomy. Liver: No focal lesion identified. There is diffuse heterogeneity within the liver, with increased parenchymal echogenicity, likely reflecting mild hepatic cirrhosis. The liver is mildly enlarged. IVC: No abnormality visualized. Pancreas: Visualized portion unremarkable. Spleen: Size and appearance within normal limits. Right Kidney: Length: 9.7 cm. Echogenicity within normal limits. No mass or hydronephrosis visualized. Left Kidney: Length: 10.2 cm. Echogenicity within normal limits. No mass or hydronephrosis visualized. Abdominal aorta: No aneurysm visualized. Not fully characterized due to overlying bowel gas. Other findings: Small to moderate volume ascites is noted within the abdomen. IMPRESSION: 1. Small to moderate volume ascites within the abdomen. 2. Diffuse heterogeneity within the liver, with increased parenchymal echogenicity, likely reflecting mild hepatic cirrhosis. Mild hepatomegaly. 3. Status post cholecystectomy. Electronically Signed   By: Roanna Raider M.D.   On: 07/01/2016 05:22   Time Spent in minutes  30   Kristien Salatino K M.D on 07/03/2016 at 9:41 AM  Between 7am to 7pm - Pager - [phone]  After 7pm go to [email or web address] - password [facility]  [facility] -  Office  [phone]

## 2016-07-03 NOTE — Evaluation (Addendum)
Occupational Therapy Evaluation Patient Details Name: [name] Foley MRN: [id number] DOB: [birth year] Today's Date: 07/03/2016    History of Present Illness Patient is a 70 yo female admitted 06/30/16 with abdominal pain and weakness.  Patient with dehydration, UTI, hypotension, anemia.    PMH:  HTN, HLD, depression, anxiety, CAD, MI, PAD, ETOH abuse, chronic pancreatitis, tobacco use   Clinical Impression   Pt admitted with the above diagnoses and presents with below problem list. Pt will benefit from continued acute OT to address the below listed deficits and maximize independence with basic ADLs prior to d/c. PTA pt was likely mod I to independent with ADLs, though no family present to confirm. Pt currenlty min guard to min A with most ADLs, min guard for functional mobility. Recommend SNF but pt stating that she plans to go home. If d/c home then pt would benefit from [facility].Pt noted to be irritated during session. Session details below.     Follow Up Recommendations  SNF;Supervision/Assistance - 24 hour    Equipment Recommendations  Other (comment) (defer to next venue; if d/c home then recommend 3n1)    Recommendations for Other Services       Precautions / Restrictions Precautions Precautions: Fall Precaution Comments: monitor HR Restrictions Weight Bearing Restrictions: No      Mobility Bed Mobility Overal bed mobility: Modified Independent             General bed mobility comments: Extra time and effort. Cueing for sequencing. Min guard for safety during powerup to EOB.   Transfers Overall transfer level: Needs assistance Equipment used: Rolling walker (2 wheeled) Transfers: Sit to/from Stand Sit to Stand: Min assist         General transfer comment: min A to steady rw with pt pulling up on rw to powerup to standing position. Min guard - min A to steady balance during descent into sitting position in recliner.    Balance Overall balance assessment: Needs  assistance;History of Falls Sitting-balance support: No upper extremity supported;Feet supported Sitting balance-Leahy Scale: Good Sitting balance - Comments: donned socks EOB   Standing balance support: Bilateral upper extremity supported;During functional activity Standing balance-Leahy Scale: Fair                              ADL Overall ADL's : Needs assistance/impaired Eating/Feeding: Set up;Sitting   Grooming: Set up;Sitting   Upper Body Bathing: Minimal assitance;Sitting   Lower Body Bathing: Minimal assistance;Sit to/from stand   Upper Body Dressing : Minimal assistance;Sitting   Lower Body Dressing: Minimal assistance;Sit to/from stand Lower Body Dressing Details (indicate cue type and reason): donned socks sitting EOB with extra time needed Toilet Transfer: Min guard;Ambulation;RW   Toileting- Water quality scientist and Hygiene: Min guard;Set up;Sitting/lateral lean;Sit to/from stand   Tub/ Shower Transfer: Min guard;Minimal assistance;Ambulation;3 in 1;Rolling walker   Functional mobility during ADLs: Min guard;Rolling walker General ADL Comments: Pt completed in-room functional mobility to go from around the bed to the recliner, min guard level with rw.      Vision     Perception     Praxis      Pertinent Vitals/Pain Pain Assessment: No/denies pain     Hand Dominance     Extremity/Trunk Assessment Upper Extremity Assessment Upper Extremity Assessment: Generalized weakness   Lower Extremity Assessment Lower Extremity Assessment: Defer to PT evaluation       Communication Communication Communication: No difficulties   Cognition Arousal/Alertness: Awake/alert  Behavior During Therapy: [facility] for tasks assessed/performed;Flat affect (Irritated) Overall Cognitive Status: No family/caregiver present to determine baseline cognitive functioning                     General Comments    Pt noted to be irritated during session. "I wish you  would stop asking me questions." "I can't wait for you to leave." "I am going home later."    Exercises       Shoulder Instructions      Home Living Family/patient expects to be discharged to:: Skilled nursing facility Living Arrangements: Alone Available Help at Discharge: Neighbor Type of Home: Apartment Home Access: Stairs to enter Entrance Stairs-Number of Steps: 3 Entrance Stairs-Rails: Right;Left Home Layout: One level               Home Equipment: [place] - single point          Prior Functioning/Environment Level of Independence: Independent with assistive device(s)        Comments: Uses cane occasionally    OT Diagnosis: Generalized weakness;Acute pain   OT Problem List: Decreased strength;Decreased activity tolerance;Impaired balance (sitting and/or standing);Decreased cognition;Decreased safety awareness;Decreased knowledge of use of DME or AE;Decreased knowledge of precautions;Cardiopulmonary status limiting activity;Pain   OT Treatment/Interventions: Self-care/ADL training;Therapeutic exercise;DME and/or AE instruction;Energy conservation;Therapeutic activities;Patient/family education;Balance training    OT Goals(Current goals can be found in the care plan section) Acute Rehab OT Goals Patient Stated Goal: None stated today OT Goal Formulation: With patient Time For Goal Achievement: 07/10/16 Potential to Achieve Goals: Good ADL Goals Pt Will Perform Grooming: with modified independence;sitting;standing Pt Will Perform Upper Body Bathing: with modified independence;sitting Pt Will Perform Lower Body Bathing: sit to/from stand;with modified independence Pt Will Perform Upper Body Dressing: with modified independence;sitting Pt Will Perform Lower Body Dressing: sit to/from stand;with modified independence Pt Will Transfer to Toilet: with modified independence;ambulating Pt Will Perform Toileting - Clothing Manipulation and hygiene: with modified  independence;sit to/from stand Pt Will Perform Tub/Shower Transfer: with modified independence;ambulating;3 in 1  OT Frequency: Min 2X/week   Barriers to D/C:            Co-evaluation              End of Session Equipment Utilized During Treatment: Gait belt;Rolling walker Nurse Communication: Other (comment) (pt up in chair)  Activity Tolerance: Patient tolerated treatment well Patient left: in chair;with call bell/phone within reach;with chair alarm set   Time: [phone] OT Time Calculation (min): 18 min Charges:  OT General Charges $OT Visit: 1 Procedure OT Evaluation $OT Eval Low Complexity: 1 Procedure G-Codes:    Hortencia Pilar 20-Jul-2016, 10:33 AM

## 2016-07-03 NOTE — Consult Note (Signed)
[facility] Consult   Reason for Consult: capacity determination Referring Physician:  Dr. Candiss Norse Patient Identification: [name] Foley MRN:  [id number] Principal Diagnosis: Alcohol abuse Diagnosis:   Patient Active Problem List   Diagnosis Date Noted  . UTI (lower urinary tract infection) [N39.0] 07/01/2016  . Leg swelling [M79.89] 07/01/2016  . Liver failure ([place]) [K72.90] 07/01/2016  . AKI (acute kidney injury) ([place]) [N17.9] 07/01/2016  . Abnormal LFTs [R79.89] 07/01/2016  . Abdominal pain [R10.9] 07/01/2016  . Bilateral lower extremity edema [R60.0]   . Hypotension [I95.9] 06/21/2016  . Alcohol abuse [F10.10] 06/14/2016  . Alcoholic liver failure ([place]) [K70.40] 06/14/2016  . Abdominal pain, chronic, epigastric - from chronic pancreatitis [R10.13, G89.29] 05/26/2014  . Chronic diarrhea - ? steatorrhea vs. IBS or both [K52.9] 04/09/2012  . PANCREATITIS, CHRONIC  [K86.1] 08/13/2008  . Hyperlipidemia with target LDL less than 100 [E78.5] 01/25/2007  . TOBACCO DEPENDENCE [F17.200] 12/21/2006    Total Time spent with patient: 1 hour  Subjective:   [name] Sherr is a 70 y.o. female patient admitted with abdominal pain, leg swelling and diarrhea  HPI:  Thank you for asking me to do a psychiatric consult on [name], a 70 y.o.woman with  History of depression, anxiety, hypertension, hyperlipidemia, GERD,  CAD, MI, PAD, chronic pancreatitis, alcohol abuse, tobacco abuse, who presents with abdominal pain but was referred to psych service for Capacity determination. Patient reports that her Depression and anxiety has been stabilized by taking Remeron, Seroquel and Clonazepam precsribed by her PCP. Patient is alert and oriented to time, person and place. She denies current psychiatric symptoms. She clearly reports that she has brought to the hospital for abdominal pain and leg swelling. She also verbalized that she did not refuse to take medications or other  treatments. However, she refused to be placed in the Nursing facility because she thinks she can still cook for herself and manage her affair. She states that she pays her bills, bank with [employer] and manages her finances. She is opened to Home health assistance if provided but not Nursing home placement. Patient also reports that she follows up with her PCP as required. She denies psychosis or delusional thinking. Mini-Mental score 26/30  Past Psychiatric History: as above  Risk to Self: Is patient at risk for suicide?: No Risk to Others:   Prior Inpatient Therapy:   Prior Outpatient Therapy:    Past Medical History:  Past Medical History:  Diagnosis Date  . Alcohol abuse    Abstinent since 2009  . Chronic pancreatitis ([place])   . Colon adenoma 03/2012   Diminutive cecal  . Common bile duct stricture   . Coronary artery disease    DES x 2 to LAD 2007, DES to LAD 07/2012  . Depression   . Essential hypertension   . Fatty liver   . GERD (gastroesophageal reflux disease)   . GERD (gastroesophageal reflux disease)   . Hemorrhoids   . Hiatal hernia   . History of pneumonia 11/2011  . Hyperlipidemia   . PAD (peripheral artery disease) ([facility])    Occluded right external iliac artery   . Psoriasis   . ST elevation myocardial infarction (STEMI) of anterior wall ([place]) 07/2012  . Type 2 diabetes mellitus ([place])     Past Surgical History:  Procedure Laterality Date  . ABDOMINAL HYSTERECTOMY  1994  . CHOLECYSTECTOMY  1976  . COLONOSCOPY     multiple  . ERCP  07/25/2012   Procedure: ENDOSCOPIC RETROGRADE  CHOLANGIOPANCREATOGRAPHY (ERCP);  Surgeon: Gatha Mayer, MD;  Location: [facility];  Service: Endoscopy;  Laterality: N/A;  with stent  . ESOPHAGOGASTRODUODENOSCOPY  10/25/2012   Procedure: ESOPHAGOGASTRODUODENOSCOPY (EGD);  Surgeon: Gatha Mayer, MD;  Location: [facility];  Service: Endoscopy;  Laterality: N/A;  EGD with stent removal using ERCP scope  . EUS  01/05/2012   Procedure: UPPER  ENDOSCOPIC ULTRASOUND (EUS) LINEAR;  Surgeon: Milus Banister, MD;  Location: [facility] ENDOSCOPY;  Service: Endoscopy;  Laterality: N/A;  radial linear  . HEMORRHOID SURGERY  09/17/2013   THD hem ligation/pexy  . LEFT HEART CATH N/A 08/10/2012   Procedure: LEFT HEART CATH;  Surgeon: Peter M Martinique, MD;  Location: [facility] CATH LAB;  Service: Cardiovascular;  Laterality: N/A;  . PERCUTANEOUS CORONARY STENT INTERVENTION (PCI-S)  08/10/2012   Procedure: PERCUTANEOUS CORONARY STENT INTERVENTION (PCI-S);  Surgeon: Peter M Martinique, MD;  Location: [facility] CATH LAB;  Service: Cardiovascular;;  . STRESS MYOCARDIAL PERFUSION  04/20/10   NORMAL PATTERN OF PERFUSION IN ALL REGIONS. LV SIZE IS NORMAL. EF= 61%. NO SIGN ISCHEMIA DEMONSTRATED.  Marland Kitchen TRANSTHORACIC ECHOCARDIOGRAM  04/20/10   PROXIMAL SEPTAL THICKENING IS NOTED. LV SYSTOLIC FUNCTION IS NORMAL. EF= >55%. LEFT ATRIAL SIZE IS NORMAL. RIGHT VENTRICULAR SYSTOLIC PRESSURE IS NORMAL. AV APPEARS TO BE MILDLY SCLEROTIC. NO AS.   Family History:  Family History  Problem Relation Age of Onset  . Colon cancer Mother     Colon resection  . Diabetes Mother   . Cirrhosis Father   . Coronary artery disease Brother   . Renal Disease Brother   . Malignant hyperthermia Neg Hx    Family Psychiatric  History:  Social History:  History  Alcohol Use  . Yes    Comment: Past h/o alcohol abuse, quit 4/09     History  Drug Use No    Social History   Social History  . Marital status: Single    Spouse name: N/A  . Number of children: 0  . Years of education: N/A   Occupational History  . RETIRED    Social History Main Topics  . Smoking status: Former Smoker    Packs/day: 0.50    Years: 15.00    Types: Cigarettes  . Smokeless tobacco: Never Used     Comment: 2 cigarettes a day  . Alcohol use Yes     Comment: Past h/o alcohol abuse, quit 4/09  . Drug use: No  . Sexual activity: No   Other Topics Concern  . None   Social History Narrative  . None   Additional  Social History:    Allergies:   Allergies  Allergen Reactions  . Diclofenac Sodium Other (See Comments)    REACTION: Chronic renal insufficiency. Patient can use the gel   . Other Other (See Comments)    Blackeyed peas and P[place] Beans...break out in hives    Labs:  Results for orders placed or performed during the hospital encounter of 06/30/16 (from the past 48 hour(s))  Protein, pleural or peritoneal fluid     Status: None   Collection Time: 07/01/16  2:38 PM  Result Value Ref Range   Total protein, fluid <3.0 g/dL    Comment: (NOTE) No normal range established for this test Results should be evaluated in conjunction with serum values    Fluid Type-FTP FLUID     Comment: PERITONEAL CORRECTED ON 09/08 AT 1603: PREVIOUSLY REPORTED AS PERITONEAL CAVITY   Body fluid cell count with differential  Status: Abnormal   Collection Time: 07/01/16  2:38 PM  Result Value Ref Range   Fluid Type-FCT FLUID     Comment: PERITONEAL CORRECTED ON 09/08 AT 1603: PREVIOUSLY REPORTED AS Peritoneal    Color, Fluid YELLOW (A) YELLOW   Appearance, Fluid CLEAR CLEAR   WBC, Fluid 177 0 - 1,000 cu mm   Neutrophil Count, Fluid 17 0 - 25 %   Lymphs, Fluid 50 %   Monocyte-Macrophage-Serous Fluid 33 (L) 50 - 90 %   Eos, Fluid 0 %  Albumin, pleural or peritoneal fluid     Status: None   Collection Time: 07/01/16  2:38 PM  Result Value Ref Range   Albumin, Fluid <1.0 g/dL    Comment: (NOTE) No normal range established for this test Results should be evaluated in conjunction with serum values    Fluid Type-FALB FLUID     Comment: PERITONEAL CORRECTED ON 09/08 AT 1605: PREVIOUSLY REPORTED AS PERITONEAL CAVITY   Gram stain     Status: None   Collection Time: 07/01/16  2:38 PM  Result Value Ref Range   Specimen Description PERITONEAL    Special Requests NONE    Gram Stain      WBC PRESENT, PREDOMINANTLY MONONUCLEAR NO ORGANISMS SEEN CYTOSPIN SMEAR    Report Status 07/01/2016 FINAL    Culture, body fluid-bottle     Status: None (Preliminary result)   Collection Time: 07/01/16  2:38 PM  Result Value Ref Range   Specimen Description PERITONEAL    Special Requests NONE    Culture NO GROWTH < 24 HOURS    Report Status PENDING   Potassium     Status: None   Collection Time: 07/01/16  9:28 PM  Result Value Ref Range   Potassium 4.4 3.5 - 5.1 mmol/L    Comment: DELTA CHECK NOTED  Procalcitonin     Status: None   Collection Time: 07/02/16  2:50 AM  Result Value Ref Range   Procalcitonin 0.39 ng/mL    Comment:        Interpretation: PCT (Procalcitonin) <= 0.5 ng/mL: Systemic infection (sepsis) is not likely. Local bacterial infection is possible. (NOTE)         ICU PCT Algorithm               Non ICU PCT Algorithm    ----------------------------     ------------------------------         PCT < 0.25 ng/mL                 PCT < 0.1 ng/mL     Stopping of antibiotics            Stopping of antibiotics       strongly encouraged.               strongly encouraged.    ----------------------------     ------------------------------       PCT level decrease by               PCT < 0.25 ng/mL       >= 80% from peak PCT       OR PCT 0.25 - 0.5 ng/mL          Stopping of antibiotics  encouraged.     Stopping of antibiotics           encouraged.    ----------------------------     ------------------------------       PCT level decrease by              PCT >= 0.25 ng/mL       < 80% from peak PCT        AND PCT >= 0.5 ng/mL            Continuin g antibiotics                                              encouraged.       Continuing antibiotics            encouraged.    ----------------------------     ------------------------------     PCT level increase compared          PCT > 0.5 ng/mL         with peak PCT AND          PCT >= 0.5 ng/mL             Escalation of antibiotics                                          strongly  encouraged.      Escalation of antibiotics        strongly encouraged.   CBC     Status: Abnormal   Collection Time: 07/02/16  2:50 AM  Result Value Ref Range   WBC 4.9 4.0 - 10.5 K/uL   RBC 2.81 (L) 3.87 - 5.11 MIL/uL   Hemoglobin 6.7 (LL) 12.0 - 15.0 g/dL    Comment: CRITICAL RESULT CALLED TO, READ BACK BY AND VERIFIED WITH: NYAKO,M RN 07/02/2016 0430 JORDANS REPEATED TO VERIFY    HCT 19.2 (L) 36.0 - 46.0 %   MCV 68.3 (L) 78.0 - 100.0 fL   MCH 23.8 (L) 26.0 - 34.0 pg   MCHC 34.9 30.0 - 36.0 g/dL   RDW 20.5 (H) 11.5 - 15.5 %   Platelets 111 (L) 150 - 400 K/uL    Comment: PLATELET COUNT CONFIRMED BY SMEAR SPECIMEN CHECKED FOR CLOTS REPEATED TO VERIFY LARGE PLATELETS PRESENT   Comprehensive metabolic panel     Status: Abnormal   Collection Time: 07/02/16  2:50 AM  Result Value Ref Range   Sodium 142 135 - 145 mmol/L    Comment: DELTA CHECK NOTED   Potassium 3.7 3.5 - 5.1 mmol/L   Chloride 117 (H) 101 - 111 mmol/L   CO2 18 (L) 22 - 32 mmol/L   Glucose, Bld 89 65 - 99 mg/dL   BUN <5 (L) 6 - 20 mg/dL   Creatinine, Ser 0.77 0.44 - 1.00 mg/dL   Calcium 7.0 (L) 8.9 - 10.3 mg/dL   Total Protein 5.1 (L) 6.5 - 8.1 g/dL   Albumin 2.5 (L) 3.5 - 5.0 g/dL   AST 54 (H) 15 - 41 U/L   ALT 19 14 - 54 U/L   Alkaline Phosphatase 90 38 - 126 U/L   Total Bilirubin 5.2 (H) 0.3 - 1.2 mg/dL   GFR calc non Af Amer >60 >60 mL/min  GFR calc Af Amer >60 >60 mL/min    Comment: (NOTE) The eGFR has been calculated using the CKD EPI equation. This calculation has not been validated in all clinical situations. eGFR's persistently <60 mL/min signify possible Chronic Kidney Disease.    Anion gap 7 5 - 15  Magnesium     Status: Abnormal   Collection Time: 07/02/16  2:50 AM  Result Value Ref Range   Magnesium 1.2 (L) 1.7 - 2.4 mg/dL  Lactic acid, plasma     Status: None   Collection Time: 07/02/16  2:50 AM  Result Value Ref Range   Lactic Acid, Venous 1.4 0.5 - 1.9 mmol/L  Prepare RBC      Status: None   Collection Time: 07/02/16  4:42 AM  Result Value Ref Range   Order Confirmation ORDER PROCESSED BY BLOOD BANK   Glucose, capillary     Status: None   Collection Time: 07/02/16  9:27 AM  Result Value Ref Range   Glucose-Capillary 81 65 - 99 mg/dL  Procalcitonin     Status: None   Collection Time: 07/03/16  2:58 AM  Result Value Ref Range   Procalcitonin 0.42 ng/mL    Comment:        Interpretation: PCT (Procalcitonin) <= 0.5 ng/mL: Systemic infection (sepsis) is not likely. Local bacterial infection is possible. (NOTE)         ICU PCT Algorithm               Non ICU PCT Algorithm    ----------------------------     ------------------------------         PCT < 0.25 ng/mL                 PCT < 0.1 ng/mL     Stopping of antibiotics            Stopping of antibiotics       strongly encouraged.               strongly encouraged.    ----------------------------     ------------------------------       PCT level decrease by               PCT < 0.25 ng/mL       >= 80% from peak PCT       OR PCT 0.25 - 0.5 ng/mL          Stopping of antibiotics                                             encouraged.     Stopping of antibiotics           encouraged.    ----------------------------     ------------------------------       PCT level decrease by              PCT >= 0.25 ng/mL       < 80% from peak PCT        AND PCT >= 0.5 ng/mL            Continuin g antibiotics                                              encouraged.  Continuing antibiotics            encouraged.    ----------------------------     ------------------------------     PCT level increase compared          PCT > 0.5 ng/mL         with peak PCT AND          PCT >= 0.5 ng/mL             Escalation of antibiotics                                          strongly encouraged.      Escalation of antibiotics        strongly encouraged.   CBC     Status: Abnormal   Collection Time: 07/03/16  2:58 AM   Result Value Ref Range   WBC 5.4 4.0 - 10.5 K/uL   RBC 3.36 (L) 3.87 - 5.11 MIL/uL   Hemoglobin 8.2 (L) 12.0 - 15.0 g/dL    Comment: REPEATED TO VERIFY POST TRANSFUSION SPECIMEN    HCT 24.1 (L) 36.0 - 46.0 %   MCV 71.7 (L) 78.0 - 100.0 fL   MCH 24.4 (L) 26.0 - 34.0 pg   MCHC 34.0 30.0 - 36.0 g/dL   RDW 21.1 (H) 11.5 - 15.5 %   Platelets 107 (L) 150 - 400 K/uL    Comment: REPEATED TO VERIFY CONSISTENT WITH PREVIOUS RESULT   Comprehensive metabolic panel     Status: Abnormal   Collection Time: 07/03/16  2:58 AM  Result Value Ref Range   Sodium 143 135 - 145 mmol/L   Potassium 4.0 3.5 - 5.1 mmol/L   Chloride 117 (H) 101 - 111 mmol/L   CO2 14 (L) 22 - 32 mmol/L   Glucose, Bld 79 65 - 99 mg/dL   BUN <5 (L) 6 - 20 mg/dL   Creatinine, Ser 0.77 0.44 - 1.00 mg/dL   Calcium 7.5 (L) 8.9 - 10.3 mg/dL   Total Protein 5.4 (L) 6.5 - 8.1 g/dL   Albumin 2.2 (L) 3.5 - 5.0 g/dL   AST 63 (H) 15 - 41 U/L   ALT 23 14 - 54 U/L   Alkaline Phosphatase 93 38 - 126 U/L   Total Bilirubin 5.2 (H) 0.3 - 1.2 mg/dL   GFR calc non Af Amer >60 >60 mL/min   GFR calc Af Amer >60 >60 mL/min    Comment: (NOTE) The eGFR has been calculated using the CKD EPI equation. This calculation has not been validated in all clinical situations. eGFR's persistently <60 mL/min signify possible Chronic Kidney Disease.    Anion gap 12 5 - 15  Magnesium     Status: None   Collection Time: 07/03/16  2:58 AM  Result Value Ref Range   Magnesium 2.1 1.7 - 2.4 mg/dL    Current Facility-Administered Medications  Medication Dose Route Frequency Provider Last Rate Last Dose  . aspirin EC tablet 81 mg  81 mg Oral Daily Ivor Costa, MD   81 mg at 07/03/16 0843  . cefTRIAXone (ROCEPHIN) 2 g in dextrose 5 % 50 mL IVPB  2 g Intravenous Q24H Ivor Costa, MD   2 g at 07/03/16 0000  . clonazePAM (KLONOPIN) tablet 1 mg  1 mg Oral QHS Ivor Costa, MD   1 mg at 07/02/16 2200  . clopidogrel (PLAVIX)  tablet 75 mg  75 mg Oral QPM Ivor Costa, MD    75 mg at 07/02/16 1955  . ferrous gluconate (FERGON) tablet 324 mg  324 mg Oral Q breakfast Ivor Costa, MD   324 mg at 07/03/16 0836  . folic acid (FOLVITE) tablet 1 mg  1 mg Oral Daily Ivor Costa, MD   1 mg at 07/03/16 0843  . lipase/protease/amylase (CREON) capsule 24,000 Units  24,000 Units Oral PRN Thurnell Lose, MD      . lipase/protease/amylase (CREON) capsule 72,000 Units  72,000 Units Oral TID WC Ivor Costa, MD   72,000 Units at 07/03/16 4128  . loperamide (IMODIUM) capsule 2 mg  2 mg Oral Q3H PRN Thurnell Lose, MD   2 mg at 07/02/16 0921  . loperamide (IMODIUM) capsule 2 mg  2 mg Oral Q4H Thurnell Lose, MD   2 mg at 07/03/16 0835  . LORazepam (ATIVAN) injection 0-4 mg  0-4 mg Intravenous Q12H Ivor Costa, MD      . LORazepam (ATIVAN) tablet 1 mg  1 mg Oral Q6H PRN Ivor Costa, MD   1 mg at 07/02/16 7867   Or  . LORazepam (ATIVAN) injection 1 mg  1 mg Intravenous Q6H PRN Ivor Costa, MD      . magnesium sulfate IVPB 1 g 100 mL  1 g Intravenous Once Thurnell Lose, MD      . mirtazapine (REMERON) tablet 15 mg  15 mg Oral QHS Ivor Costa, MD   15 mg at 07/02/16 2200  . morphine 2 MG/ML injection 1 mg  1 mg Intravenous Q4H PRN Ivor Costa, MD      . multivitamin with minerals tablet 1 tablet  1 tablet Oral Daily Ivor Costa, MD   1 tablet at 07/03/16 0843  . nitroGLYCERIN (NITROSTAT) SL tablet 0.4 mg  0.4 mg Sublingual Q5 Min x 3 PRN Ivor Costa, MD      . ondansetron [facility]) tablet 4 mg  4 mg Oral Q6H PRN Ivor Costa, MD       Or  . ondansetron [facility]) injection 4 mg  4 mg Intravenous Q6H PRN Ivor Costa, MD      . pantoprazole (PROTONIX) EC tablet 40 mg  40 mg Oral Daily Ivor Costa, MD   40 mg at 07/03/16 0842  . QUEtiapine (SEROQUEL) tablet 50 mg  50 mg Oral QHS Ivor Costa, MD   50 mg at 07/02/16 2200  . sodium chloride flush (NS) 0.9 % injection 3 mL  3 mL Intravenous Q12H Ivor Costa, MD   3 mL at 07/03/16 0844  . thiamine (VITAMIN B-1) tablet 100 mg  100 mg Oral Daily Ivor Costa, MD   100 mg at  07/03/16 0843  . traMADol (ULTRAM) tablet 25 mg  25 mg Oral Q6H PRN Ivor Costa, MD        Musculoskeletal: Strength & Muscle Tone: within normal limits Gait & Station: unsteady Patient leans: N/A  Psychiatric Specialty Exam: Physical Exam  Psychiatric: Her speech is normal and behavior is normal. Judgment and thought content normal. Her affect is blunt. Cognition and memory are normal.    Review of Systems  Constitutional: Positive for malaise/fatigue.  HENT: Negative.   Eyes: Negative.   Respiratory: Negative.   Cardiovascular: Negative.   Genitourinary: Negative.   Skin: Negative.   Neurological: Positive for weakness.  Psychiatric/Behavioral: The patient is nervous/anxious and has insomnia.     Blood pressure 116/76, pulse (!) 52, temperature 98.4  F (36.9 C), temperature source Oral, resp. rate 18, height '5\' 2"'$  (1.575 m), weight 65.8 kg (145 lb), SpO2 96 %.Body mass index is 26.52 kg/m.  General Appearance: Casual  Eye Contact:  Good  Speech:  Clear and Coherent  Volume:  Normal  Mood:  Dysphoric  Affect:  Appropriate  Thought Process:  Coherent and Descriptions of Associations: Intact  Orientation:  Full (Time, Place, and Person)  Thought Content:  Logical  Suicidal Thoughts:  No  Homicidal Thoughts:  No  Memory:  Immediate;   Fair Recent;   Fair Remote;   Good  Judgement:  Fair  Insight:  Shallow  Psychomotor Activity:  Psychomotor Retardation  Concentration:  Concentration: Fair and Attention Span: Fair  Recall:  [employer] of Knowledge:  Fair  Language:  Fair  Akathisia:  No  Handed:  Right  AIMS (if indicated):     Assets:  Communication Skills Desire for Improvement Housing  ADL's:  limited  Cognition:  WNL  Sleep:   fair     Treatment Plan Summary: Plan /Recomendation: -At this point, patient has capacity to make medical decision as evidenced by her ability to willingly accept medication management and other treatments provided. -Regarding her  refusal to accept Nursing home placement, her competency to make that decision has to be determine legally. Patient may benefit from having a legal guardian to care for her interest. -Follow up with Unit social worker for additional help. -Re-consult psychiatric service if necessary in future.  Disposition:  No evidence of imminent risk to self or others at present.   Patient does not meet criteria for psychiatric inpatient admission. Supportive therapy provided about ongoing stressors.  Corena Pilgrim, MD 07/03/2016 1:32 PM

## 2016-07-04 LAB — COMPREHENSIVE METABOLIC PANEL
ALK PHOS: 99 U/L (ref 38–126)
ALT: 25 U/L (ref 14–54)
AST: 71 U/L — ABNORMAL HIGH (ref 15–41)
Albumin: 2.5 g/dL — ABNORMAL LOW (ref 3.5–5.0)
Anion gap: 8 (ref 5–15)
BUN: <5 mg/dL — ABNORMAL LOW (ref 6–20)
CALCIUM: 7.9 mg/dL — AB (ref 8.9–10.3)
CO2: 15 mmol/L — ABNORMAL LOW (ref 22–32)
CREATININE: 0.77 mg/dL (ref 0.44–1.00)
Chloride: 119 mmol/L — ABNORMAL HIGH (ref 101–111)
Glucose, Bld: 116 mg/dL — ABNORMAL HIGH (ref 65–99)
Potassium: 3.7 mmol/L (ref 3.5–5.1)
Sodium: 142 mmol/L (ref 135–145)
TOTAL PROTEIN: 6.3 g/dL — AB (ref 6.5–8.1)
Total Bilirubin: 6.5 mg/dL — ABNORMAL HIGH (ref 0.3–1.2)

## 2016-07-04 LAB — CBC
HCT: 26.2 % — ABNORMAL LOW (ref 36.0–46.0)
HEMOGLOBIN: 9.1 g/dL — AB (ref 12.0–15.0)
MCH: 24.9 pg — AB (ref 26.0–34.0)
MCHC: 34.7 g/dL (ref 30.0–36.0)
MCV: 71.6 fL — AB (ref 78.0–100.0)
Platelets: 113 10*3/uL — ABNORMAL LOW (ref 150–400)
RBC: 3.66 MIL/uL — AB (ref 3.87–5.11)
RDW: 21.8 % — ABNORMAL HIGH (ref 11.5–15.5)
WBC: 7 10*3/uL (ref 4.0–10.5)

## 2016-07-04 LAB — MAGNESIUM: MAGNESIUM: 2.1 mg/dL (ref 1.7–2.4)

## 2016-07-04 MED ORDER — THIAMINE HCL 100 MG PO TABS: 100.0000 mg | ORAL_TABLET | Freq: Every day | ORAL | 0 refills | Status: DC

## 2016-07-04 MED ORDER — FOLIC ACID 1 MG PO TABS: 1.0000 mg | ORAL_TABLET | Freq: Every day | ORAL | 0 refills | Status: DC

## 2016-07-04 MED ORDER — LOPERAMIDE HCL 2 MG PO CAPS: 2.0000 mg | ORAL_CAPSULE | ORAL | 0 refills | Status: DC | PRN

## 2016-07-04 NOTE — Discharge Summary (Addendum)
[name] Foley [id number] DOB: June 21, 1946 DOA: 06/30/2016  PCP: Philis Fendt, MD  Admit date: 06/30/2016  Discharge date: 07/04/2016  Admitted From: Home   Disposition:  Home   Recommendations for Outpatient Follow-up:   Follow up with PCP in 1-2 weeks  PCP Please obtain BMP/CBC, 2 view CXR in 1week,  (see Discharge instructions)   PCP Please follow up on the following pending results: None   Home Health: None   Equipment/Devices: None  Consultations: None Discharge Condition: Stable   CODE STATUS: Full   Diet Recommendation: Healthy, 1.5 L total daily fluid restriction.   Chief Complaint  Patient presents with  . Abdominal Pain  . Leg Swelling     Brief history of present illness from the day of admission and additional interim summary    [name] Foley a 70 y.o.femalewith medical history significant of hypertension, hyperlipidemia, GERD, depression, anxiety, CAD, MI, PAD, chronic pancreatitis, alcohol abuse, tobacco abuse, who presents with abdominal pain, diarrhea, dysuria.  Patient was recently hospitalized from 8/29-8/30 due to diarrhea and hypotension. Patient had negative C. difficile PCR test on 06/22/16. Patient states that she has abdominal pain in the past several days. She also has abdominal distention. Herabdominal pain is diffuse, moderate, nonradiating. It is not aggravated or alleviated by any factors. She has nausea, but no vomiting. She has 2-3 bowel movement withloose stool each day. Patient does not have fever or chills. She also reports dysuria and burning on urination. She denies chest pain, shortness of breath, cough, unilateral weakness.  ED Course:pt was found to have hypotension with blood pressure 89/69 which improves with IV fluid resuscitation. WBC 9.1, lactate  3.64, BNP 46.4, lipase 11, posterior urinalysis with small amount of leukocytes, abnormal liver function with ALP 162, all between 1.8, AST 88, ALT 31, total bilirubin 5.5. AKI with Cre 1.35, sodium 131, potassium 4.4, bicarbonate 14, anion gap 11. Pt is admitted to stepdown bed for further eval and treatment. PCCMwas consulted by EDP, recommended fluids resuscitation.  Hospital issues addressed     1.Generalized abdominal pain with hypotension. Possible sepsis versus severe dehydration. Os slightly due to her acute on chronic diarrhea from pancreatic insufficiency, had minimal ascites and UTI. She remained afebrile and without leukocytosis. She was treated with Rocephin and one dose fosfomycin for UTI , Paracentesis has ruled out SBP, CT scan abdomen and pelvis ruled out bowel ischemia or colitis, she is clinically much better with just supportive care, was hydrated with IV fluids, Lactate has stabilized.   She is now stable, symptom-free and eager to go home and I will let her follow with her primary gastroenterologist Dr. Carlean Purl post discharge.  2. History of chronic pancreatitis. Lipase is stable, CT scan as above has been ordered, counseled to quit alcohol, continue pancreatic enzymes with meals. Follow with PCP & Dr. Carlean Purl.  3. UTI. Was treated with Rocephin and then 1 dose of fosfomycin once cultures came back.  4. Diet-controlled type 2 diabetes mellitus we'll continue to monitor.  5. Dehydration  causing hyponatremia. Improved with IV fluids.  6. Chronic iron deficiency anemia with acute fall due to IV fluids causing dilution. Stable anemia panel, on PPI, transfused 1 unit on 07/02/2016, post transfusion H&H remain stable we'll continue to monitor.  7. CAD. No acute issues chest pain-free. On aspirin and Plavix for secondary prevention. Blood pressure too low for beta blocker, statins if liver enzymes and otherwise stable.  8. Leg swelling and ascites. Due to cirrhosis and  hypoalbuminemia. Supportive care. Diuresis in the outpatient setting by PCP if blood pressures remain stable.  9. History of alcohol abuse. Counseled to quit, she says last drink was 2 weeks ago, no DTs.  10. Chronic diarrhea. Ruled out C. difficile, Imodium and Creon to continue, continue supportive care.  11.Hypokalemia and hypomagnesemia. Replaced & stable.  12. Normal  anion gap metabolic acidosis due to diarrhea. Treat diarrhea.   Discharge diagnosis     Principal Problem:   Alcohol abuse Active Problems:   Hyperlipidemia with target LDL less than 100   PANCREATITIS, CHRONIC    Chronic diarrhea - ? steatorrhea vs. IBS or both   Hypotension   UTI (lower urinary tract infection)   Leg swelling   AKI (acute kidney injury) (HCC)   Abnormal LFTs   Abdominal pain    Discharge instructions    Discharge Instructions    Discharge instructions    Complete by:  As directed   Follow with Primary MD Philis Fendt, MD in 7 days   Get CBC, CMP, 2 view Chest X ray checked  by Primary MD or SNF MD in 5-7 days ( we routinely change or add medications that can affect your baseline labs and fluid status, therefore we recommend that you get the mentioned basic workup next visit with your PCP, your PCP may decide not to get them or add new tests based on their clinical decision)   Activity: As tolerated with Full fall precautions use walker/cane & assistance as needed   Disposition Home     Diet:   Heart Healthy low Carb. Check your Weight same time everyday, if you gain over 2 pounds, or you develop in leg swelling, experience more shortness of breath or chest pain, call your Primary MD immediately. Follow Cardiac Low Salt Diet and 1.5 lit/day fluid restriction.   On your next visit with your primary care physician please Get Medicines reviewed and adjusted.   Please request your Prim.MD to go over all Hospital Tests and Procedure/Radiological results at the follow up, please  get all Hospital records sent to your Prim MD by signing hospital release before you go home.   If you experience worsening of your admission symptoms, develop shortness of breath, life threatening emergency, suicidal or homicidal thoughts you must seek medical attention immediately by calling 911 or calling your MD immediately  if symptoms less severe.  You Must read complete instructions/literature along with all the possible adverse reactions/side effects for all the Medicines you take and that have been prescribed to you. Take any new Medicines after you have completely understood and accpet all the possible adverse reactions/side effects.   Do not drive, operate heavy machinery, perform activities at heights, swimming or participation in water activities or provide baby sitting services if your were admitted for syncope or siezures until you have seen by Primary MD or a Neurologist and advised to do so again.  Do not drive when taking Pain medications.    Do not take more than prescribed Pain,  Sleep and Anxiety Medications  Special Instructions: If you have smoked or chewed Tobacco  in the last 2 yrs please stop smoking, stop any regular Alcohol  and or any Recreational drug use.  Wear Seat belts while driving.   Please note  You were cared for by a hospitalist during your hospital stay. If you have any questions about your discharge medications or the care you received while you were in the hospital after you are discharged, you can call the unit and asked to speak with the hospitalist on call if the hospitalist that took care of you is not available. Once you are discharged, your primary care physician will handle any further medical issues. Please note that NO REFILLS for any discharge medications will be authorized once you are discharged, as it is imperative that you return to your primary care physician (or establish a relationship with a primary care physician if you do not have one)  for your aftercare needs so that they can reassess your need for medications and monitor your lab values.   Increase activity slowly    Complete by:  As directed      Discharge Medications     Medication List    STOP taking these medications   polyethylene glycol packet Commonly known as:  MIRALAX / GLYCOLAX     TAKE these medications   acetaminophen 650 MG CR tablet Commonly known as:  TYLENOL 8 HOUR Take 1 tablet (650 mg total) by mouth every 8 (eight) hours as needed for pain.   aspirin 81 MG EC tablet Take 1 tablet (81 mg total) by mouth daily.   atorvastatin 40 MG tablet Commonly known as:  LIPITOR TAKE ONE (1) TABLET EACH DAY AT 6PM What changed:  See the new instructions.   clonazePAM 1 MG tablet Commonly known as:  KLONOPIN Take 1 tablet (1 mg total) by mouth at bedtime.   clopidogrel 75 MG tablet Commonly known as:  PLAVIX Take 75 mg by mouth every evening.   diclofenac sodium 1 % Gel Commonly known as:  VOLTAREN Apply 4 g topically 4 (four) times daily.   feeding supplement (ENSURE ENLIVE) Liqd Take 237 mLs by mouth 3 (three) times daily between meals.   ferrous gluconate 324 MG tablet Commonly known as:  FERGON Take 1 tablet (324 mg total) by mouth daily with breakfast.   folic acid 1 MG tablet Commonly known as:  FOLVITE Take 1 tablet (1 mg total) by mouth daily.   loperamide 2 MG capsule Commonly known as:  IMODIUM Take 1 capsule (2 mg total) by mouth every 3 (three) hours as needed for diarrhea or loose stools.   magnesium gluconate 30 MG tablet Commonly known as:  MAGONATE Take 1 tablet (30 mg total) by mouth 2 (two) times daily. What changed:  when to take this   mirtazapine 15 MG tablet Commonly known as:  REMERON Take 1 tablet (15 mg total) by mouth at bedtime.   multivitamin tablet Take 1 tablet by mouth daily.   nicotine 14 mg/24hr patch Commonly known as:  NICODERM CQ Place 1 patch onto the skin daily.   nitroGLYCERIN 0.4 MG  SL tablet Commonly known as:  NITROSTAT Place 1 tablet (0.4 mg total) under the tongue every 5 (five) minutes x 3 doses as needed for chest pain.   omeprazole 40 MG capsule Commonly known as:  PRILOSEC TAKE ONE CAPSULE BY MOUTH DAILY What changed:  See the new instructions.   Pancrelipase (Lip-Prot-Amyl) 24000  units Cpep Commonly known as:  CREON TAKE 3 CAPSULES 3 TIMES A DAY WITH MEALS AND TAKE ONE CAPSULE WITH SNACKS What changed:  how much to take  how to take this  when to take this  additional instructions   QUEtiapine 50 MG tablet Commonly known as:  SEROQUEL Take 50 mg by mouth at bedtime.   thiamine 100 MG tablet Take 1 tablet (100 mg total) by mouth daily.   traMADol 50 MG tablet Commonly known as:  ULTRAM Take 0.5 tablets (25 mg total) by mouth every 6 (six) hours as needed for moderate pain or severe pain.       Follow-up Information    Philis Fendt, MD. Schedule an appointment as soon as possible for a visit in 1 week(s).   Specialty:  Internal Medicine Contact information: [facility] [address] [phone]        Silvano Rusk, MD. Schedule an appointment as soon as possible for a visit in 1 week(s).   Specialty:  Gastroenterology Contact information: [address] [place] [address] [phone]           Major procedures and Radiology Reports - PLEASE review detailed and final reports thoroughly  -      Abdominal ultrasound. Small ascites and cirrhosis  CT abdomen and pelvis - Non acute  Ultrasound-guided paracentesis. No SBP.   Dg Chest 2 View  Result Date: 06/14/2016 CLINICAL DATA:  Bilateral ankle edema, cough EXAM: CHEST  2 VIEW COMPARISON:  05/16/2016 FINDINGS: There is no focal parenchymal opacity. There is no pleural effusion or pneumothorax. The heart and mediastinal contours are unremarkable. The osseous structures are unremarkable. IMPRESSION: No active cardiopulmonary disease. Electronically  Signed   By: Kathreen Devoid   On: 06/14/2016 17:03   US Abdomen Complete  Result Date: 07/01/2016 CLINICAL DATA:  Acute onset of upper abdominal pain and abdominal distention. Initial encounter. EXAM: ABDOMEN ULTRASOUND COMPLETE COMPARISON:  CT of the abdomen and pelvis, and right upper quadrant ultrasound, performed 06/08/2016 FINDINGS: Gallbladder: Status post cholecystectomy.  No retained stones seen. Common bile duct: Diameter: 0.8 cm, within normal limits in caliber status post cholecystectomy. Liver: No focal lesion identified. There is diffuse heterogeneity within the liver, with increased parenchymal echogenicity, likely reflecting mild hepatic cirrhosis. The liver is mildly enlarged. IVC: No abnormality visualized. Pancreas: Visualized portion unremarkable. Spleen: Size and appearance within normal limits. Right Kidney: Length: 9.7 cm. Echogenicity within normal limits. No mass or hydronephrosis visualized. Left Kidney: Length: 10.2 cm. Echogenicity within normal limits. No mass or hydronephrosis visualized. Abdominal aorta: No aneurysm visualized. Not fully characterized due to overlying bowel gas. Other findings: Small to moderate volume ascites is noted within the abdomen. IMPRESSION: 1. Small to moderate volume ascites within the abdomen. 2. Diffuse heterogeneity within the liver, with increased parenchymal echogenicity, likely reflecting mild hepatic cirrhosis. Mild hepatomegaly. 3. Status post cholecystectomy. Electronically Signed   By: Garald Balding M.D.   On: 07/01/2016 05:22   Ct Abdomen Pelvis W Contrast  Result Date: 07/01/2016 CLINICAL DATA:  Abdominal pain for several days. EXAM: CT ABDOMEN AND PELVIS WITH CONTRAST TECHNIQUE: Multidetector CT imaging of the abdomen and pelvis was performed using the standard protocol following bolus administration of intravenous contrast. CONTRAST:  1 ISOVUE-300 IOPAMIDOL (ISOVUE-300) INJECTION 61% COMPARISON:  CT 06/08/2016 FINDINGS: Lower chest:  Bilateral pleural effusions and basilar atelectasis Hepatobiliary: Marked hepatic steatosis. Postcholecystectomy. No biliary duct dilatation. Pancreas: Extensive pancreatic calcifications. No pancreatic pseudocyst formation. No clear evidence  acute pancreatitis. Spleen: Normal Adrenals/Urinary Tract: Adrenal glands and kidneys are normal. Kidneys enhance symmetrically. No obstructive uropathy. Bladder normal Stomach/Bowel: The stomach, duodenum, and small bowel are not obstructed. There is mild small bowel wall edema likely related to ascites. There is mild edema of the ascending colon. There is submucosal edema within the ascending and transverse colon. Descending colon is normal. Sigmoid colon relatively normal. Rectum normal. Vascular/Lymphatic: Abdominal aorta is normal caliber. There is no retroperitoneal or periportal lymphadenopathy. No pelvic lymphadenopathy. No atherosclerotic calcification. The SMA and celiac trunk are patent. Reproductive: Post hysterectomy. Other: Moderate volume of free fluid within the abdomen pelvis. This is increased from 06/08/2016. There multiple small ventral hernias containing peritoneal fat and peritoneal fluid. Anasarca of the extraperitoneal soft tissues. Musculoskeletal: No aggressive osseous lesion. IMPRESSION: 1. Moderate volume of free fluid in the abdomen pelvis suggest ascites. Severe hepatic steatosis. Query hepatic dysfunction. 2. Small bowel RIGHT colon submucosal edema. Edema favored secondary to passive transfer from ascites. No clear evidence of ischemia. Of note, there is NO atherosclerotic disease of the aorta or branch vessels. 3. Bilateral pleural effusions and basilar atelectasis increased from prior. 4. Anasarca of the soft tissues. Findings consistent with fluid overload. Electronically Signed   By: Suzy Bouchard M.D.   On: 07/01/2016 14:48   Ct Abdomen Pelvis W Contrast  Result Date: 06/08/2016 CLINICAL DATA:  Upper abdominal pain, 3 days duration.  Nausea and vomiting today. Constipation. EXAM: CT ABDOMEN AND PELVIS WITH CONTRAST TECHNIQUE: Multidetector CT imaging of the abdomen and pelvis was performed using the standard protocol following bolus administration of intravenous contrast. CONTRAST:  12mL ISOVUE-300 IOPAMIDOL (ISOVUE-300) INJECTION 61% COMPARISON:  07/18/2012 FINDINGS: Lower chest: Left effusion layering dependently. Atelectasis of the dependent left lung. Coronary artery stent. Hepatobiliary: Profound fatty change of the liver. Previous cholecystectomy. Portal and hepatic veins are patent. Pancreas: Chronic pancreatic calcifications secondary to old pancreatitis. No evidence of active pancreatic process. Spleen: Upper limits of normal in size without focal lesion. Adrenals/Urinary Tract: Adrenal glands are normal. Left kidney is normal. Small cysts at the upper pole of the right kidney. Stomach/Bowel: Question edema of the colon wall that could go along with colitis. No evidence of bowel obstruction. Vascular/Lymphatic: Normal Reproductive: Previous hysterectomy.  No pelvic mass. Other: Small amount of ascites. Periumbilical ventral hernia containing only fat. Right upper quadrant anterior abdominal wall hernia containing only fat. Musculoskeletal:  Normal IMPRESSION: Profound fatty change of the liver. 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 Probable edema of the wall of the colon consistent with colitis. Small amount of ascites. Small left pleural effusion layering dependently. Periumbilical hernia containing fat. Right upper quadrant anterior abdominal wall hernia containing only fat. Electronically Signed   By: Nelson Chimes M.D.   On: 06/08/2016 13:03   US Paracentesis  Result Date: 07/01/2016 INDICATION: Ascites EXAM: ULTRASOUND-GUIDED PARACENTESIS COMPARISON:  None. MEDICATIONS: 10 cc 1% lidocaine COMPLICATIONS: None immediate. TECHNIQUE: Informed written consent was obtained from the patient after a  discussion of the risks, benefits and alternatives to treatment. A timeout was performed prior to the initiation of the procedure. Initial ultrasound scanning demonstrates a small amount of ascites within the right lower abdominal quadrant. The right lower abdomen was prepped and draped in the usual sterile fashion. 1% lidocaine was used for local anesthesia. Under direct ultrasound guidance, a 19 gauge, 7-cm, Yueh catheter was introduced. An ultrasound image was saved for documentation purposed. The paracentesis was performed. The catheter was removed and a dressing was applied.  The patient tolerated the procedure well without immediate post procedural complication. FINDINGS: A total of approximately 180 cc of yellow fluid was removed. Samples were sent to the laboratory as requested by the clinical team. IMPRESSION: Successful ultrasound-guided paracentesis yielding 180 cc of peritoneal fluid. Read by Lavonia Drafts [facility] Electronically Signed   By: Corrie Mckusick D.O.   On: 07/01/2016 14:44   Dg Abd Acute W/chest  Result Date: 06/21/2016 CLINICAL DATA:  Lower extremity edema, hypotensive. History of diabetes and hypertension. EXAM: DG ABDOMEN ACUTE W/ 1V CHEST COMPARISON:  Chest radiograph June 14, 2016 and CT abdomen and pelvis June 08, 2016 FINDINGS: Cardiomediastinal silhouette is normal. Coronary artery stent. LEFT lung base atelectasis/scarring. Lungs are otherwise clear, no pleural effusions. No pneumothorax. Soft tissue planes and included osseous structures are unremarkable. Bowel gas pattern is nondilated. Scattered nondistended small bowel air-fluid level is predominately in the pelvis. Paucity of large bowel gas. No intra-abdominal mass effect, pathologic calcifications or free air. Soft tissue planes and included osseous structures are non-suspicious. Coarse pancreatic calcifications are seen on prior CT. Surgical clips in the included right abdomen compatible with cholecystectomy. Surgical  clips in RIGHT inguinal soft tissues compatible with prior vascular access. IMPRESSION: No acute cardiopulmonary process. Nondistended small bowel with air-fluid levels suggesting enteritis though, partial or early small bowel obstruction may have this appearance. Electronically Signed   By: Elon Alas M.D.   On: 06/21/2016 16:43   Dg Abd Portable 1v  Result Date: 07/01/2016 CLINICAL DATA:  Abdominal pain with distention EXAM: PORTABLE ABDOMEN - 1 VIEW COMPARISON:  06/08/2016 FINDINGS: Scattered large and small bowel gas is noted. No abnormal mass is noted. Multiple calcifications are noted in the midline consistent with chronic pancreatitis. These are stable from the prior exam. No acute bony abnormality is noted. IMPRESSION: No acute abnormality seen. Electronically Signed   By: Inez Catalina M.D.   On: 07/01/2016 10:24   US Abdomen Limited Ruq  Result Date: 06/08/2016 CLINICAL DATA:  Three day history of right upper quadrant pain with nausea vomiting today. EXAM: US ABDOMEN LIMITED - RIGHT UPPER QUADRANT COMPARISON:  CT scan 06/08/2016 FINDINGS: Gallbladder: Surgically absent. Common bile duct: Diameter: 10 mm. Liver: Coarsening of the echotexture suggests steatosis. IMPRESSION: 1. Status postcholecystectomy. 2. Mild distention of the extrahepatic common duct for patient age. This may be related to prior cholecystectomy. Correlation with liver function test may prove helpful. Electronically Signed   By: Misty Stanley M.D.   On: 06/08/2016 18:53    Micro Results    Recent Results (from the past 240 hour(s))  Urine culture     Status: Abnormal   Collection Time: 06/30/16  8:55 PM  Result Value Ref Range Status   Specimen Description URINE, RANDOM  Final   Special Requests NONE  Final   Culture >=100,000 COLONIES/mL ENTEROCOCCUS FAECIUM (A)  Final   Report Status 07/03/2016 FINAL  Final   Organism ID, Bacteria ENTEROCOCCUS FAECIUM (A)  Final      Susceptibility   Enterococcus faecium -  MIC*    AMPICILLIN <=2 SENSITIVE Sensitive     LEVOFLOXACIN 2 SENSITIVE Sensitive     NITROFURANTOIN 64 INTERMEDIATE Intermediate     VANCOMYCIN <=0.5 SENSITIVE Sensitive     * >=100,000 COLONIES/mL ENTEROCOCCUS FAECIUM  Culture, blood (x 2)     Status: None (Preliminary result)   Collection Time: 07/01/16  4:01 AM  Result Value Ref Range Status   Specimen Description BLOOD RIGHT HAND  Final  Special Requests IN PEDIATRIC BOTTLE 1ML  Final   Culture NO GROWTH 2 DAYS  Final   Report Status PENDING  Incomplete  Culture, blood (x 2)     Status: None (Preliminary result)   Collection Time: 07/01/16  4:11 AM  Result Value Ref Range Status   Specimen Description BLOOD LEFT FOREARM  Final   Special Requests BOTTLES DRAWN AEROBIC AND ANAEROBIC 5ML  Final   Culture NO GROWTH 2 DAYS  Final   Report Status PENDING  Incomplete  MRSA PCR Screening     Status: None   Collection Time: 07/01/16  7:56 AM  Result Value Ref Range Status   MRSA by PCR NEGATIVE NEGATIVE Final    Comment:        The GeneXpert MRSA Assay (FDA approved for NASAL specimens only), is one component of a comprehensive MRSA colonization surveillance program. It is not intended to diagnose MRSA infection nor to guide or monitor treatment for MRSA infections.   Gastrointestinal Panel by PCR , Stool     Status: None   Collection Time: 07/01/16 12:11 PM  Result Value Ref Range Status   Campylobacter species NOT DETECTED NOT DETECTED Final   Plesimonas shigelloides NOT DETECTED NOT DETECTED Final   Salmonella species NOT DETECTED NOT DETECTED Final   Yersinia enterocolitica NOT DETECTED NOT DETECTED Final   Vibrio species NOT DETECTED NOT DETECTED Final   Vibrio cholerae NOT DETECTED NOT DETECTED Final   Enteroaggregative E coli (EAEC) NOT DETECTED NOT DETECTED Final   Enteropathogenic E coli (EPEC) NOT DETECTED NOT DETECTED Final   Enterotoxigenic E coli (ETEC) NOT DETECTED NOT DETECTED Final   Shiga like toxin  producing E coli (STEC) NOT DETECTED NOT DETECTED Final   Shigella/Enteroinvasive E coli (EIEC) NOT DETECTED NOT DETECTED Final   Cryptosporidium NOT DETECTED NOT DETECTED Final   Cyclospora cayetanensis NOT DETECTED NOT DETECTED Final   Entamoeba histolytica NOT DETECTED NOT DETECTED Final   Giardia lamblia NOT DETECTED NOT DETECTED Final   Adenovirus F40/41 NOT DETECTED NOT DETECTED Final   Astrovirus NOT DETECTED NOT DETECTED Final   Norovirus GI/GII NOT DETECTED NOT DETECTED Final   Rotavirus A NOT DETECTED NOT DETECTED Final   Sapovirus (I, II, IV, and V) NOT DETECTED NOT DETECTED Final  C difficile quick screen w PCR reflex     Status: None   Collection Time: 07/01/16 12:11 PM  Result Value Ref Range Status   C Diff antigen NEGATIVE NEGATIVE Final   C Diff toxin NEGATIVE NEGATIVE Final   C Diff interpretation No C. difficile detected.  Final  Gram stain     Status: None   Collection Time: 07/01/16  2:38 PM  Result Value Ref Range Status   Specimen Description PERITONEAL  Final   Special Requests NONE  Final   Gram Stain   Final    WBC PRESENT, PREDOMINANTLY MONONUCLEAR NO ORGANISMS SEEN CYTOSPIN SMEAR    Report Status 07/01/2016 FINAL  Final  Culture, body fluid-bottle     Status: None (Preliminary result)   Collection Time: 07/01/16  2:38 PM  Result Value Ref Range Status   Specimen Description PERITONEAL  Final   Special Requests NONE  Final   Culture NO GROWTH 2 DAYS  Final   Report Status PENDING  Incomplete    Today   Subjective    [name] Foley today has no headache,no chest abdominal pain,no new weakness tingling or numbness, feels much better wants to go home today.  Objective   Blood pressure 98/81, pulse (!) 106, temperature 99.1 F (37.3 C), temperature source Oral, resp. rate 20, height 5\' 2"  (1.575 m), weight 66 kg (145 lb 6.4 oz), SpO2 95 %.   Intake/Output Summary (Last 24 hours) at 07/04/16 0900 Last data filed at 07/03/16 1700  Gross per  24 hour  Intake              360 ml  Output                0 ml  Net              360 ml    Exam Awake Alert, Oriented x 3, No new F.N deficits, Normal affect [place].AT,PERRAL Supple Neck,No JVD, No cervical lymphadenopathy appriciated.  Symmetrical Chest wall movement, Good air movement bilaterally, CTAB RRR,No Gallops,Rubs or new Murmurs, No Parasternal Heave +ve B.Sounds, Abd Soft, Non tender, No organomegaly appriciated, No rebound -guarding or rigidity. No Cyanosis, Clubbing or edema, No new Rash or bruise   Data Review   CBC w Diff: Lab Results  Component Value Date   WBC 7.0 07/04/2016   HGB 9.1 (L) 07/04/2016   HCT 26.2 (L) 07/04/2016   PLT 113 (L) 07/04/2016   LYMPHOPCT 28 06/30/2016   MONOPCT 12 06/30/2016   EOSPCT 1 06/30/2016   BASOPCT 0 06/30/2016    CMP: Lab Results  Component Value Date   NA 142 07/04/2016   K 3.7 07/04/2016   CL 119 (H) 07/04/2016   CO2 15 (L) 07/04/2016   BUN <5 (L) 07/04/2016   CREATININE 0.77 07/04/2016   PROT 6.3 (L) 07/04/2016   ALBUMIN 2.5 (L) 07/04/2016   BILITOT 6.5 (H) 07/04/2016   ALKPHOS 99 07/04/2016   AST 71 (H) 07/04/2016   ALT 25 07/04/2016  .   Total Time in preparing paper work, data evaluation and todays exam - 35 minutes  Thurnell Lose M.D on 07/04/2016 at 9:00 AM  [facility]   Office  [phone]

## 2016-07-04 NOTE — Progress Notes (Signed)
PT Cancellation Note  Patient Details Name: [name] Foley MRN: [id number] DOB: [birth year]   Cancelled Treatment:    Reason Eval/Treat Not Completed: Patient declined, no reason specified. Pt is refusing SNF and is scheduled to d/c home today. Pt is refusing PT, stating "get out. I'm not going to have any problem getting around."   Lorriane Shire 07/04/2016, 9:46 AM

## 2016-07-04 NOTE — Care Management Important Message (Signed)
Important Message  Patient Details  Name: [name] Foley MRN: [id number] Date of Birth: 1946/09/24   Medicare Important Message Given:  Yes    Karoline Fleer [address] 07/04/2016, 2:49 PM

## 2016-07-04 NOTE — Clinical Social Work Note (Signed)
Patient refusing SNF. She states she has money to pay for a cab home. CSW signing off.   Liz Beach MSW, [place], [place], [id number]

## 2016-07-04 NOTE — Progress Notes (Signed)
Spoke w pt and niece. Pt refuses snf, hhc, mobile meals. She does not want anyone in her home. Niece at a loss and enc pt to accept some help but pt adamently refueses all services. Have left vm for adult prot services [facility] [phone] of pt's dc back to home.

## 2016-07-04 NOTE — Progress Notes (Signed)
1233 discharge instructiond given by Angelica Swot RN Pt discharged with niece . Wheeled to lobby by Engineer, manufacturing

## 2016-07-04 NOTE — Discharge Instructions (Signed)
Follow with Primary MD Philis Fendt, MD in 7 days   Get CBC, CMP, 2 view Chest X ray checked  by Primary MD or SNF MD in 5-7 days ( we routinely change or add medications that can affect your baseline labs and fluid status, therefore we recommend that you get the mentioned basic workup next visit with your PCP, your PCP may decide not to get them or add new tests based on their clinical decision)   Activity: As tolerated with Full fall precautions use walker/cane & assistance as needed   Disposition Home     Diet:   Heart Healthy low Carb. Check your Weight same time everyday, if you gain over 2 pounds, or you develop in leg swelling, experience more shortness of breath or chest pain, call your Primary MD immediately. Follow Cardiac Low Salt Diet and 1.5 lit/day fluid restriction.   On your next visit with your primary care physician please Get Medicines reviewed and adjusted.   Please request your Prim.MD to go over all Hospital Tests and Procedure/Radiological results at the follow up, please get all Hospital records sent to your Prim MD by signing hospital release before you go home.   If you experience worsening of your admission symptoms, develop shortness of breath, life threatening emergency, suicidal or homicidal thoughts you must seek medical attention immediately by calling 911 or calling your MD immediately  if symptoms less severe.  You Must read complete instructions/literature along with all the possible adverse reactions/side effects for all the Medicines you take and that have been prescribed to you. Take any new Medicines after you have completely understood and accpet all the possible adverse reactions/side effects.   Do not drive, operate heavy machinery, perform activities at heights, swimming or participation in water activities or provide baby sitting services if your were admitted for syncope or siezures until you have seen by Primary MD or a Neurologist and advised  to do so again.  Do not drive when taking Pain medications.    Do not take more than prescribed Pain, Sleep and Anxiety Medications  Special Instructions: If you have smoked or chewed Tobacco  in the last 2 yrs please stop smoking, stop any regular Alcohol  and or any Recreational drug use.  Wear Seat belts while driving.   Please note  You were cared for by a hospitalist during your hospital stay. If you have any questions about your discharge medications or the care you received while you were in the hospital after you are discharged, you can call the unit and asked to speak with the hospitalist on call if the hospitalist that took care of you is not available. Once you are discharged, your primary care physician will handle any further medical issues. Please note that NO REFILLS for any discharge medications will be authorized once you are discharged, as it is imperative that you return to your primary care physician (or establish a relationship with a primary care physician if you do not have one) for your aftercare needs so that they can reassess your need for medications and monitor your lab values.

## 2016-07-06 ENCOUNTER — Encounter (HOSPITAL_COMMUNITY): Payer: Self-pay

## 2016-07-06 ENCOUNTER — Inpatient Hospital Stay (HOSPITAL_COMMUNITY): Payer: Medicare Other

## 2016-07-06 ENCOUNTER — Inpatient Hospital Stay (HOSPITAL_COMMUNITY)
Admission: EM | Admit: 2016-07-06 | Discharge: 2016-07-13 | DRG: 871 | Disposition: A | Discharge status: Hospice | Payer: Medicare Other | Attending: Internal Medicine | Admitting: Internal Medicine

## 2016-07-06 ENCOUNTER — Emergency Department (HOSPITAL_COMMUNITY): Payer: Medicare Other

## 2016-07-06 DIAGNOSIS — R401 Stupor: Secondary | ICD-10-CM | POA: Diagnosis not present

## 2016-07-06 DIAGNOSIS — R1084 Generalized abdominal pain: Secondary | ICD-10-CM | POA: Diagnosis not present

## 2016-07-06 DIAGNOSIS — K7040 Alcoholic hepatic failure without coma: Secondary | ICD-10-CM | POA: Diagnosis present

## 2016-07-06 DIAGNOSIS — I10 Essential (primary) hypertension: Secondary | ICD-10-CM | POA: Diagnosis present

## 2016-07-06 DIAGNOSIS — Z7189 Other specified counseling: Secondary | ICD-10-CM

## 2016-07-06 DIAGNOSIS — Z66 Do not resuscitate: Secondary | ICD-10-CM | POA: Diagnosis not present

## 2016-07-06 DIAGNOSIS — L409 Psoriasis, unspecified: Secondary | ICD-10-CM | POA: Diagnosis present

## 2016-07-06 DIAGNOSIS — G8929 Other chronic pain: Secondary | ICD-10-CM | POA: Diagnosis present

## 2016-07-06 DIAGNOSIS — Z955 Presence of coronary angioplasty implant and graft: Secondary | ICD-10-CM

## 2016-07-06 DIAGNOSIS — K529 Noninfective gastroenteritis and colitis, unspecified: Secondary | ICD-10-CM | POA: Diagnosis present

## 2016-07-06 DIAGNOSIS — K760 Fatty (change of) liver, not elsewhere classified: Secondary | ICD-10-CM | POA: Diagnosis present

## 2016-07-06 DIAGNOSIS — K7011 Alcoholic hepatitis with ascites: Secondary | ICD-10-CM | POA: Diagnosis present

## 2016-07-06 DIAGNOSIS — K767 Hepatorenal syndrome: Secondary | ICD-10-CM | POA: Diagnosis not present

## 2016-07-06 DIAGNOSIS — N170 Acute kidney failure with tubular necrosis: Secondary | ICD-10-CM | POA: Diagnosis not present

## 2016-07-06 DIAGNOSIS — K7290 Hepatic failure, unspecified without coma: Secondary | ICD-10-CM

## 2016-07-06 DIAGNOSIS — R34 Anuria and oliguria: Secondary | ICD-10-CM | POA: Diagnosis not present

## 2016-07-06 DIAGNOSIS — E876 Hypokalemia: Secondary | ICD-10-CM | POA: Diagnosis present

## 2016-07-06 DIAGNOSIS — E46 Unspecified protein-calorie malnutrition: Secondary | ICD-10-CM | POA: Diagnosis present

## 2016-07-06 DIAGNOSIS — K861 Other chronic pancreatitis: Secondary | ICD-10-CM | POA: Diagnosis present

## 2016-07-06 DIAGNOSIS — W1830XA Fall on same level, unspecified, initial encounter: Secondary | ICD-10-CM | POA: Diagnosis present

## 2016-07-06 DIAGNOSIS — K860 Alcohol-induced chronic pancreatitis: Secondary | ICD-10-CM | POA: Diagnosis not present

## 2016-07-06 DIAGNOSIS — A419 Sepsis, unspecified organism: Secondary | ICD-10-CM | POA: Diagnosis present

## 2016-07-06 DIAGNOSIS — D684 Acquired coagulation factor deficiency: Secondary | ICD-10-CM | POA: Diagnosis present

## 2016-07-06 DIAGNOSIS — Z8249 Family history of ischemic heart disease and other diseases of the circulatory system: Secondary | ICD-10-CM

## 2016-07-06 DIAGNOSIS — Z23 Encounter for immunization: Secondary | ICD-10-CM

## 2016-07-06 DIAGNOSIS — E1151 Type 2 diabetes mellitus with diabetic peripheral angiopathy without gangrene: Secondary | ICD-10-CM | POA: Diagnosis present

## 2016-07-06 DIAGNOSIS — I2510 Atherosclerotic heart disease of native coronary artery without angina pectoris: Secondary | ICD-10-CM | POA: Diagnosis present

## 2016-07-06 DIAGNOSIS — K449 Diaphragmatic hernia without obstruction or gangrene: Secondary | ICD-10-CM | POA: Diagnosis present

## 2016-07-06 DIAGNOSIS — R1013 Epigastric pain: Secondary | ICD-10-CM | POA: Diagnosis not present

## 2016-07-06 DIAGNOSIS — Z833 Family history of diabetes mellitus: Secondary | ICD-10-CM

## 2016-07-06 DIAGNOSIS — I252 Old myocardial infarction: Secondary | ICD-10-CM

## 2016-07-06 DIAGNOSIS — D696 Thrombocytopenia, unspecified: Secondary | ICD-10-CM

## 2016-07-06 DIAGNOSIS — E872 Acidosis, unspecified: Secondary | ICD-10-CM

## 2016-07-06 DIAGNOSIS — R7989 Other specified abnormal findings of blood chemistry: Secondary | ICD-10-CM

## 2016-07-06 DIAGNOSIS — N179 Acute kidney failure, unspecified: Secondary | ICD-10-CM | POA: Diagnosis not present

## 2016-07-06 DIAGNOSIS — R680 Hypothermia, not associated with low environmental temperature: Secondary | ICD-10-CM | POA: Diagnosis not present

## 2016-07-06 DIAGNOSIS — Z888 Allergy status to other drugs, medicaments and biological substances status: Secondary | ICD-10-CM

## 2016-07-06 DIAGNOSIS — R140 Abdominal distension (gaseous): Secondary | ICD-10-CM

## 2016-07-06 DIAGNOSIS — Z87891 Personal history of nicotine dependence: Secondary | ICD-10-CM

## 2016-07-06 DIAGNOSIS — Z515 Encounter for palliative care: Secondary | ICD-10-CM | POA: Diagnosis not present

## 2016-07-06 DIAGNOSIS — Z841 Family history of disorders of kidney and ureter: Secondary | ICD-10-CM

## 2016-07-06 DIAGNOSIS — I959 Hypotension, unspecified: Secondary | ICD-10-CM | POA: Diagnosis not present

## 2016-07-06 DIAGNOSIS — K7682 Hepatic encephalopathy: Secondary | ICD-10-CM

## 2016-07-06 DIAGNOSIS — Z91018 Allergy to other foods: Secondary | ICD-10-CM

## 2016-07-06 DIAGNOSIS — E785 Hyperlipidemia, unspecified: Secondary | ICD-10-CM | POA: Diagnosis present

## 2016-07-06 DIAGNOSIS — D6959 Other secondary thrombocytopenia: Secondary | ICD-10-CM | POA: Diagnosis present

## 2016-07-06 DIAGNOSIS — Z7902 Long term (current) use of antithrombotics/antiplatelets: Secondary | ICD-10-CM

## 2016-07-06 DIAGNOSIS — Z7982 Long term (current) use of aspirin: Secondary | ICD-10-CM

## 2016-07-06 DIAGNOSIS — F1010 Alcohol abuse, uncomplicated: Secondary | ICD-10-CM | POA: Diagnosis not present

## 2016-07-06 DIAGNOSIS — K7200 Acute and subacute hepatic failure without coma: Secondary | ICD-10-CM | POA: Diagnosis not present

## 2016-07-06 DIAGNOSIS — K219 Gastro-esophageal reflux disease without esophagitis: Secondary | ICD-10-CM | POA: Diagnosis present

## 2016-07-06 DIAGNOSIS — Z9049 Acquired absence of other specified parts of digestive tract: Secondary | ICD-10-CM

## 2016-07-06 DIAGNOSIS — J189 Pneumonia, unspecified organism: Secondary | ICD-10-CM | POA: Diagnosis present

## 2016-07-06 DIAGNOSIS — D509 Iron deficiency anemia, unspecified: Secondary | ICD-10-CM | POA: Diagnosis present

## 2016-07-06 DIAGNOSIS — F329 Major depressive disorder, single episode, unspecified: Secondary | ICD-10-CM | POA: Diagnosis present

## 2016-07-06 DIAGNOSIS — R791 Abnormal coagulation profile: Secondary | ICD-10-CM

## 2016-07-06 DIAGNOSIS — Z90710 Acquired absence of both cervix and uterus: Secondary | ICD-10-CM

## 2016-07-06 DIAGNOSIS — Z79899 Other long term (current) drug therapy: Secondary | ICD-10-CM

## 2016-07-06 HISTORY — DX: Unspecified chronic bronchitis: J42

## 2016-07-06 HISTORY — DX: Personal history of other medical treatment: Z92.89

## 2016-07-06 HISTORY — DX: Heart failure, unspecified: I50.9

## 2016-07-06 HISTORY — DX: Family history of other specified conditions: Z84.89

## 2016-07-06 LAB — CULTURE, BODY FLUID W GRAM STAIN -BOTTLE: Culture: NO GROWTH

## 2016-07-06 LAB — URINE MICROSCOPIC-ADD ON: RBC / HPF: NONE SEEN RBC/hpf (ref 0–5)

## 2016-07-06 LAB — CULTURE, BLOOD (ROUTINE X 2)
CULTURE: NO GROWTH
CULTURE: NO GROWTH

## 2016-07-06 LAB — COMPREHENSIVE METABOLIC PANEL
ALBUMIN: 2.3 g/dL — AB (ref 3.5–5.0)
ALK PHOS: 91 U/L (ref 38–126)
ALT: 30 U/L (ref 14–54)
ANION GAP: 10 (ref 5–15)
AST: 63 U/L — AB (ref 15–41)
CALCIUM: 8.4 mg/dL — AB (ref 8.9–10.3)
CO2: 16 mmol/L — AB (ref 22–32)
Chloride: 115 mmol/L — ABNORMAL HIGH (ref 101–111)
Creatinine, Ser: 0.98 mg/dL (ref 0.44–1.00)
GFR calc Af Amer: >60 mL/min (ref >60)
GFR calc non Af Amer: 58 mL/min — ABNORMAL LOW (ref >60)
GLUCOSE: 99 mg/dL (ref 65–99)
Potassium: 3.5 mmol/L (ref 3.5–5.1)
SODIUM: 141 mmol/L (ref 135–145)
Total Bilirubin: 6.4 mg/dL — ABNORMAL HIGH (ref 0.3–1.2)
Total Protein: 5.9 g/dL — ABNORMAL LOW (ref 6.5–8.1)

## 2016-07-06 LAB — ETHANOL

## 2016-07-06 LAB — URINALYSIS, ROUTINE W REFLEX MICROSCOPIC
Glucose, UA: NEGATIVE mg/dL
HGB URINE DIPSTICK: NEGATIVE
Ketones, ur: 15 mg/dL — AB
NITRITE: POSITIVE — AB
Protein, ur: NEGATIVE mg/dL
SPECIFIC GRAVITY, URINE: 1.02 (ref 1.005–1.030)
pH: 6 (ref 5.0–8.0)

## 2016-07-06 LAB — I-STAT CG4 LACTIC ACID, ED
Lactic Acid, Venous: 1.94 mmol/L (ref 0.5–1.9)
Lactic Acid, Venous: 1.06 mmol/L (ref 0.5–1.9)

## 2016-07-06 LAB — MAGNESIUM: MAGNESIUM: 1.9 mg/dL (ref 1.7–2.4)

## 2016-07-06 LAB — PROTIME-INR
INR: 6.32
Prothrombin Time: 57.7 seconds — ABNORMAL HIGH (ref 11.4–15.2)

## 2016-07-06 LAB — MRSA PCR SCREENING: MRSA BY PCR: NEGATIVE

## 2016-07-06 LAB — CK: Total CK: 158 U/L (ref 38–234)

## 2016-07-06 LAB — GLUCOSE, CAPILLARY
Glucose-Capillary: 136 mg/dL — ABNORMAL HIGH (ref 65–99)
Glucose-Capillary: 149 mg/dL — ABNORMAL HIGH (ref 65–99)

## 2016-07-06 LAB — LIPASE, BLOOD: LIPASE: 12 U/L (ref 11–51)

## 2016-07-06 LAB — POC OCCULT BLOOD, ED: Fecal Occult Bld: POSITIVE — AB

## 2016-07-06 LAB — AMMONIA: AMMONIA: 88 umol/L — AB (ref 9–35)

## 2016-07-06 LAB — CULTURE, BODY FLUID-BOTTLE

## 2016-07-06 MED ORDER — FOLIC ACID 1 MG PO TABS
1.0000 mg | ORAL_TABLET | Freq: Every day | ORAL | Status: DC
  Administered 2016-07-06 – 2016-07-11 (×5): 1 mg via ORAL
  Filled 2016-07-06 (×6): qty 1

## 2016-07-06 MED ORDER — INFLUENZA VAC SPLIT QUAD 0.5 ML IM SUSY
0.5000 mL | PREFILLED_SYRINGE | INTRAMUSCULAR | Status: DC
  Filled 2016-07-06: qty 0.5

## 2016-07-06 MED ORDER — ONDANSETRON HCL 4 MG/2ML IJ SOLN: 4.0000 mg | Freq: Four times a day (QID) | INTRAMUSCULAR | Status: DC | PRN

## 2016-07-06 MED ORDER — TRAMADOL HCL 50 MG PO TABS: 25.0000 mg | ORAL_TABLET | Freq: Four times a day (QID) | ORAL | Status: DC | PRN

## 2016-07-06 MED ORDER — ONDANSETRON HCL 4 MG PO TABS: 4.0000 mg | ORAL_TABLET | Freq: Four times a day (QID) | ORAL | Status: DC | PRN

## 2016-07-06 MED ORDER — NICOTINE 14 MG/24HR TD PT24
14.0000 mg | MEDICATED_PATCH | TRANSDERMAL | Status: DC
  Administered 2016-07-06 – 2016-07-12 (×7): 14 mg via TRANSDERMAL
  Filled 2016-07-06 (×7): qty 1

## 2016-07-06 MED ORDER — SODIUM BICARBONATE 8.4 % IV SOLN
INTRAVENOUS | Status: DC
  Administered 2016-07-06 – 2016-07-08 (×3): via INTRAVENOUS
  Filled 2016-07-06 (×5): qty 150

## 2016-07-06 MED ORDER — QUETIAPINE FUMARATE 50 MG PO TABS
50.0000 mg | ORAL_TABLET | Freq: Every day | ORAL | Status: DC
  Administered 2016-07-06: 50 mg via ORAL
  Filled 2016-07-06: qty 1

## 2016-07-06 MED ORDER — ADULT MULTIVITAMIN W/MINERALS CH
1.0000 | ORAL_TABLET | Freq: Every day | ORAL | Status: DC
  Administered 2016-07-06 – 2016-07-11 (×5): 1 via ORAL
  Filled 2016-07-06 (×6): qty 1

## 2016-07-06 MED ORDER — VANCOMYCIN HCL IN DEXTROSE 1-5 GM/200ML-% IV SOLN
1000.0000 mg | Freq: Once | INTRAVENOUS | Status: AC
  Administered 2016-07-06: 1000 mg via INTRAVENOUS
  Filled 2016-07-06: qty 200

## 2016-07-06 MED ORDER — HYDROMORPHONE HCL 1 MG/ML IJ SOLN
0.5000 mg | Freq: Once | INTRAMUSCULAR | Status: AC
  Administered 2016-07-06: 0.5 mg via INTRAVENOUS
  Filled 2016-07-06: qty 1

## 2016-07-06 MED ORDER — VITAMIN B-1 100 MG PO TABS
100.0000 mg | ORAL_TABLET | Freq: Every day | ORAL | Status: DC
  Administered 2016-07-06 – 2016-07-11 (×5): 100 mg via ORAL
  Filled 2016-07-06 (×6): qty 1

## 2016-07-06 MED ORDER — MIRTAZAPINE 15 MG PO TABS
15.0000 mg | ORAL_TABLET | Freq: Every day | ORAL | Status: DC
  Administered 2016-07-06 – 2016-07-10 (×5): 15 mg via ORAL
  Filled 2016-07-06 (×5): qty 1

## 2016-07-06 MED ORDER — PANTOPRAZOLE SODIUM 40 MG PO TBEC
40.0000 mg | DELAYED_RELEASE_TABLET | Freq: Every day | ORAL | Status: DC
  Administered 2016-07-06 – 2016-07-11 (×5): 40 mg via ORAL
  Filled 2016-07-06 (×6): qty 1

## 2016-07-06 MED ORDER — SODIUM CHLORIDE 0.9 % IV BOLUS (SEPSIS)
1000.0000 mL | Freq: Once | INTRAVENOUS | Status: AC
  Administered 2016-07-06: 1000 mL via INTRAVENOUS

## 2016-07-06 MED ORDER — MAGNESIUM GLUCONATE 500 (27 MG) MG PO TABS
500.0000 mg | ORAL_TABLET | ORAL | Status: DC
  Administered 2016-07-08 – 2016-07-09 (×2): 500 mg via ORAL
  Filled 2016-07-06 (×4): qty 1

## 2016-07-06 MED ORDER — ONDANSETRON HCL 4 MG/2ML IJ SOLN
4.0000 mg | Freq: Once | INTRAMUSCULAR | Status: AC
  Administered 2016-07-06: 4 mg via INTRAVENOUS
  Filled 2016-07-06: qty 2

## 2016-07-06 MED ORDER — DEXTROSE 5 % IV SOLN
2.0000 g | INTRAVENOUS | Status: DC
  Administered 2016-07-07 – 2016-07-09 (×3): 2 g via INTRAVENOUS
  Filled 2016-07-06 (×5): qty 2

## 2016-07-06 MED ORDER — PANCRELIPASE (LIP-PROT-AMYL) 12000-38000 UNITS PO CPEP
36000.0000 [IU] | ORAL_CAPSULE | Freq: Three times a day (TID) | ORAL | Status: DC
  Administered 2016-07-08 – 2016-07-09 (×6): 36000 [IU] via ORAL
  Filled 2016-07-06 (×6): qty 3

## 2016-07-06 MED ORDER — LACTULOSE 10 GM/15ML PO SOLN
30.0000 g | Freq: Three times a day (TID) | ORAL | Status: DC
  Administered 2016-07-06: 30 g via ORAL
  Administered 2016-07-07: 20 g via ORAL
  Administered 2016-07-08 – 2016-07-11 (×10): 30 g via ORAL
  Filled 2016-07-06 (×15): qty 45

## 2016-07-06 MED ORDER — VANCOMYCIN HCL 500 MG IV SOLR
500.0000 mg | Freq: Two times a day (BID) | INTRAVENOUS | Status: DC
  Administered 2016-07-07 – 2016-07-10 (×7): 500 mg via INTRAVENOUS
  Filled 2016-07-06 (×9): qty 500

## 2016-07-06 MED ORDER — FERROUS GLUCONATE 324 (38 FE) MG PO TABS
324.0000 mg | ORAL_TABLET | Freq: Every day | ORAL | Status: DC
  Administered 2016-07-08 – 2016-07-09 (×2): 324 mg via ORAL
  Filled 2016-07-06 (×5): qty 1

## 2016-07-06 MED ORDER — LACTULOSE 10 GM/15ML PO SOLN
30.0000 g | Freq: Once | ORAL | Status: AC
  Administered 2016-07-06: 30 g via ORAL
  Filled 2016-07-06: qty 45

## 2016-07-06 MED ORDER — ENSURE ENLIVE PO LIQD
237.0000 mL | Freq: Three times a day (TID) | ORAL | Status: DC
  Administered 2016-07-07 – 2016-07-11 (×12): 237 mL via ORAL

## 2016-07-06 MED ORDER — DEXTROSE 5 % IV SOLN
2.0000 g | Freq: Once | INTRAVENOUS | Status: AC
  Administered 2016-07-06: 2 g via INTRAVENOUS
  Filled 2016-07-06: qty 2

## 2016-07-06 MED ORDER — PHYTONADIONE 5 MG PO TABS
5.0000 mg | ORAL_TABLET | Freq: Every day | ORAL | Status: AC
  Administered 2016-07-06 – 2016-07-07 (×2): 5 mg via ORAL
  Filled 2016-07-06 (×2): qty 1

## 2016-07-06 NOTE — ED Triage Notes (Signed)
Pt presents with report of unwitnessed fall this morning.   Pt reports she was attempting to get out of bed when her feet became tangled in blanket causing pt to fall.  Pt reports falling on her buttocks, denies any pain to that area.  Pt reports generalized abdominal pain that began last night, denies any nausea or vomiting or diarrhea.

## 2016-07-06 NOTE — Progress Notes (Signed)
Pharmacy Antibiotic Note  [name] Foley is a 70 y.o. female admitted on 07/06/2016 with mechanical fall and abdominal pain.  Pharmacy has been consulted for vancomycin and cefepime dosing. CXR report states patchy area suggestive of PNA. Lactic acid elevated at 1.94,  temp normal at 97.8. SCr 0.98 and CrCl 50 mL/min. No CBC ordered - last WBC 7 on 9/11 . Blood pressure soft at 80s/60s.   Plan: Vancomycin 1g IV once in ED followed by Vancomycin 500 mg q12hr Goal vancomycin trough 15-20 mcg/mL  Cefepime 2g q24hr  Monitor renal function, clinical status, culture results, and vancomycin trough as needed  Height: 5\' 2"  (157.5 cm) Weight: 145 lb (65.8 kg) IBW/kg (Calculated) : 50.1  Temp (24hrs), Avg:97.8 F (36.6 C), Min:97.8 F (36.6 C), Max:97.8 F (36.6 C)   Recent Labs Lab 06/30/16 1823 07/01/16 0239 07/01/16 0411 07/01/16 0718 07/02/16 0250 07/03/16 0258 07/04/16 0209 07/06/16 1245 07/06/16 1343  WBC 9.1  --  5.4  --  4.9 5.4 7.0  --   --   CREATININE 1.35*  --  1.05*  --  0.77 0.77 0.77 0.98  --   LATICACIDVEN  --  3.64* 3.0* 2.8* 1.4  --   --   --  1.94*    Estimated Creatinine Clearance: 48.9 mL/min (by C-G formula based on SCr of 0.98 mg/dL).    Allergies  Allergen Reactions  . Diclofenac Sodium Other (See Comments)    REACTION: Chronic renal insufficiency. Patient can use the gel   . Other Other (See Comments)    Blackeyed peas and [place] Beans...break out in hives    Antimicrobials this admission: 9/13 Cefepime >>  9/13 Vanc  >>   Dose adjustments this admission: N/A  Microbiology results: Pending   Thank you for allowing pharmacy to be a part of this patient's care.  Argie Ramming, PharmD Pharmacy Resident  Pager [phone] 07/06/16 2:44 PM

## 2016-07-06 NOTE — ED Notes (Signed)
Taken to CT at this time. 

## 2016-07-06 NOTE — ED Notes (Signed)
Critical notification INR 6.32, Dr. Venora Maples notified.

## 2016-07-06 NOTE — ED Notes (Signed)
PIV placed by EMS to R hand was infiltrated, catheter removed;  Attempted PIV placement to L forearm with 22g angiocath, unable to place but pt tolerated well.

## 2016-07-06 NOTE — H&P (Signed)
History and Physical    [name] Chiu [id number] DOB: February 08, 1946 DOA: 07/06/2016  PCP: Philis Fendt, MD  Patient coming from: Home   Chief Complaint: fall, hypotension   HPI: [name] Foley is a 70 y.o. female with medical history significant of ESLD, alcohol abuse, chronic pancreatitis, last admission to the hospital 9-11 for abdominal pain, diarrhea, hypotension, UTI. Patient was found down on the floor by her niece the day of admission., On EMS arrival patient was found to be hypotensive, SBP 70. Patient report a mechanical fall the day of admission. She is also complaining of her chronic abdominal pain, mild abdominal distension. Per niece patient is still drinking alcohol and smoking cigarette.   ED Course: SBP 85/68, bili at 6 , INR 6, Ammonia 88, bicarb 16, Chest x ray; Minimal opacity in the right mid lung may represent a patchy area of pneumonia. Platelet 113, lactic acid 1.9---1.1.   Review of Systems: As per HPI otherwise 10 point review of systems negative.    Past Medical History:  Diagnosis Date  . Alcohol abuse    Abstinent since 2009  . Chronic pancreatitis ([place])   . Colon adenoma 03/2012   Diminutive cecal  . Common bile duct stricture   . Coronary artery disease    DES x 2 to LAD 2007, DES to LAD 07/2012  . Depression   . Essential hypertension   . Fatty liver   . GERD (gastroesophageal reflux disease)   . GERD (gastroesophageal reflux disease)   . Hemorrhoids   . Hiatal hernia   . History of pneumonia 11/2011  . Hyperlipidemia   . PAD (peripheral artery disease) (HCC)    Occluded right external iliac artery   . Psoriasis   . ST elevation myocardial infarction (STEMI) of anterior wall ([place]) 07/2012  . Type 2 diabetes mellitus ([place])     Past Surgical History:  Procedure Laterality Date  . ABDOMINAL HYSTERECTOMY  1994  . CHOLECYSTECTOMY  1976  . COLONOSCOPY     multiple  . ERCP  07/25/2012   Procedure: ENDOSCOPIC RETROGRADE  CHOLANGIOPANCREATOGRAPHY (ERCP);  Surgeon: Gatha Mayer, MD;  Location: [facility];  Service: Endoscopy;  Laterality: N/A;  with stent  . ESOPHAGOGASTRODUODENOSCOPY  10/25/2012   Procedure: ESOPHAGOGASTRODUODENOSCOPY (EGD);  Surgeon: Gatha Mayer, MD;  Location: [facility];  Service: Endoscopy;  Laterality: N/A;  EGD with stent removal using ERCP scope  . EUS  01/05/2012   Procedure: UPPER ENDOSCOPIC ULTRASOUND (EUS) LINEAR;  Surgeon: Milus Banister, MD;  Location: [facility] ENDOSCOPY;  Service: Endoscopy;  Laterality: N/A;  radial linear  . HEMORRHOID SURGERY  09/17/2013   THD hem ligation/pexy  . LEFT HEART CATH N/A 08/10/2012   Procedure: LEFT HEART CATH;  Surgeon: Peter M Martinique, MD;  Location: [facility] CATH LAB;  Service: Cardiovascular;  Laterality: N/A;  . PERCUTANEOUS CORONARY STENT INTERVENTION (PCI-S)  08/10/2012   Procedure: PERCUTANEOUS CORONARY STENT INTERVENTION (PCI-S);  Surgeon: Peter M Martinique, MD;  Location: [facility] CATH LAB;  Service: Cardiovascular;;  . STRESS MYOCARDIAL PERFUSION  04/20/10   NORMAL PATTERN OF PERFUSION IN ALL REGIONS. LV SIZE IS NORMAL. EF= 61%. NO SIGN ISCHEMIA DEMONSTRATED.  Marland Kitchen TRANSTHORACIC ECHOCARDIOGRAM  04/20/10   PROXIMAL SEPTAL THICKENING IS NOTED. LV SYSTOLIC FUNCTION IS NORMAL. EF= >55%. LEFT ATRIAL SIZE IS NORMAL. RIGHT VENTRICULAR SYSTOLIC PRESSURE IS NORMAL. AV APPEARS TO BE MILDLY SCLEROTIC. NO AS.     reports that she has quit smoking. Her smoking use included Cigarettes. She has  a 7.50 pack-year smoking history. She has never used smokeless tobacco. She reports that she drinks alcohol. She reports that she does not use drugs.  Allergies  Allergen Reactions  . Diclofenac Sodium Other (See Comments)    REACTION: Chronic renal insufficiency. Patient can use the gel   . Other Other (See Comments)    Blackeyed peas and Pinto Beans...break out in hives    Family History  Problem Relation Age of Onset  . Colon cancer Mother     Colon resection  . Diabetes  Mother   . Cirrhosis Father   . Coronary artery disease Brother   . Renal Disease Brother   . Malignant hyperthermia Neg Hx      Prior to Admission medications   Medication Sig Start Date End Date Taking? Authorizing Provider  acetaminophen (TYLENOL 8 HOUR) 650 MG CR tablet Take 1 tablet (650 mg total) by mouth every 8 (eight) hours as needed for pain. 02/05/16  Yes Varney Biles, MD  aspirin EC 81 MG EC tablet Take 1 tablet (81 mg total) by mouth daily. 08/13/12  Yes Isaiah Serge, NP  atorvastatin (LIPITOR) 40 MG tablet TAKE ONE (1) TABLET EACH DAY AT 6PM Patient taking differently: Take 40 mg by mouth each day 09/16/13  Yes Lorretta Harp, MD  clonazePAM (KLONOPIN) 1 MG tablet Take 1 tablet (1 mg total) by mouth at bedtime. 09/02/15  Yes Gatha Mayer, MD  clopidogrel (PLAVIX) 75 MG tablet Take 75 mg by mouth every evening.    Yes Historical Provider, MD  diclofenac sodium (VOLTAREN) 1 % GEL Apply 4 g topically 4 (four) times daily. 09/02/15  Yes Gatha Mayer, MD  feeding supplement, ENSURE ENLIVE, (ENSURE ENLIVE) LIQD Take 237 mLs by mouth 3 (three) times daily between meals. 06/17/16  Yes Costin Karlyne Greenspan, MD  ferrous gluconate (FERGON) 324 MG tablet Take 1 tablet (324 mg total) by mouth daily with breakfast. 06/17/16  Yes Costin Karlyne Greenspan, MD  folic acid (FOLVITE) 1 MG tablet Take 1 tablet (1 mg total) by mouth daily. 07/04/16  Yes Thurnell Lose, MD  loperamide (IMODIUM) 2 MG capsule Take 1 capsule (2 mg total) by mouth every 3 (three) hours as needed for diarrhea or loose stools. 07/04/16  Yes Thurnell Lose, MD  magnesium gluconate (MAGONATE) 30 MG tablet Take 1 tablet (30 mg total) by mouth 2 (two) times daily. Patient taking differently: Take 30 mg by mouth every morning.  06/17/16  Yes Costin Karlyne Greenspan, MD  mirtazapine (REMERON) 15 MG tablet Take 1 tablet (15 mg total) by mouth at bedtime. 07/13/12  Yes Gatha Mayer, MD  Multiple Vitamins-Minerals (MULTIVITAMIN) tablet Take 1  tablet by mouth daily. 06/17/16  Yes Costin Karlyne Greenspan, MD  nicotine (NICODERM CQ) 14 mg/24hr patch Place 1 patch onto the skin daily. 04/03/13  Yes Lorretta Harp, MD  nitroGLYCERIN (NITROSTAT) 0.4 MG SL tablet Place 1 tablet (0.4 mg total) under the tongue every 5 (five) minutes x 3 doses as needed for chest pain. 09/02/15  Yes Lorretta Harp, MD  omeprazole (PRILOSEC) 40 MG capsule TAKE ONE CAPSULE BY MOUTH DAILY Patient taking differently: TAKE 40 MG BY MOUTH DAILY 01/21/16  Yes Gatha Mayer, MD  Pancrelipase, Lip-Prot-Amyl, (CREON) 24000 UNITS CPEP TAKE 3 CAPSULES 3 TIMES A DAY WITH MEALS AND TAKE ONE CAPSULE WITH SNACKS Patient taking differently: Take 24,000-72,000 Units by mouth See admin instructions. Take 72000 units by mouth 3 times daily with  meals and take 24000 units by mouth with snack. 09/02/15  Yes Gatha Mayer, MD  QUEtiapine (SEROQUEL) 50 MG tablet Take 50 mg by mouth at bedtime.   Yes Historical Provider, MD  thiamine 100 MG tablet Take 1 tablet (100 mg total) by mouth daily. 07/04/16  Yes Thurnell Lose, MD  traMADol (ULTRAM) 50 MG tablet Take 0.5 tablets (25 mg total) by mouth every 6 (six) hours as needed for moderate pain or severe pain. 06/17/16  Yes Caren Griffins, MD    Physical Exam: Vitals:   07/06/16 1545 07/06/16 1546 07/06/16 1600 07/06/16 1615  BP: 110/76  119/86 97/73  Pulse: 78  75 76  Resp: 24  19 20   Temp:      TempSrc:      SpO2: 95% 95% 93% 94%  Weight:      Height:          Constitutional: NAD, calm, comfortable Vitals:   07/06/16 1545 07/06/16 1546 07/06/16 1600 07/06/16 1615  BP: 110/76  119/86 97/73  Pulse: 78  75 76  Resp: 24  19 20   Temp:      TempSrc:      SpO2: 95% 95% 93% 94%  Weight:      Height:       Eyes: PERRL, lids and icteric  ENMT: Mucous membranes are moist. Posterior pharynx clear of any exudate or lesions.Normal dentition.  Neck: normal, supple, no masses, no thyromegaly Respiratory: clear to auscultation  bilaterally, no wheezing, no crackles. Normal respiratory effort. No accessory muscle use.  Cardiovascular: Regular rate and rhythm, no murmurs / rubs / gallops. No extremity edema. 2+ pedal pulses. No carotid bruits.  Abdomen:  hepatomegaly. Bowel sounds positive. Mild abdominal distension , mild tenderness.  Musculoskeletal: no clubbing / cyanosis. No joint deformity upper and lower extremities. Good ROM, no contractures. Normal muscle tone.  Skin: no rashes, lesions, ulcers. No induration Neurologic: CN 2-12 grossly intact. Sensation intact, DTR normal. Strength 5/5 in all 4.  Psychiatric:  Alert and oriented x 3.     Labs on Admission: I have personally reviewed following labs and imaging studies  CBC:  Recent Labs Lab 06/30/16 1823 07/01/16 0411 07/02/16 0250 07/03/16 0258 07/04/16 0209  WBC 9.1 5.4 4.9 5.4 7.0  NEUTROABS 5.4  --   --   --   --   HGB 10.1* 7.4* 6.7* 8.2* 9.1*  HCT 29.6* 21.7* 19.2* 24.1* 26.2*  MCV 68.7* 68.0* 68.3* 71.7* 71.6*  PLT 265 150 111* 107* 123456*   Basic Metabolic Panel:  Recent Labs Lab 07/01/16 0411 07/01/16 1100 07/01/16 2128 07/02/16 0250 07/03/16 0258 07/04/16 0209 07/06/16 1245  NA 135  --   --  142 143 142 141  K 2.8*  --  4.4 3.7 4.0 3.7 3.5  CL 108  --   --  117* 117* 119* 115*  CO2 17*  --   --  18* 14* 15* 16*  GLUCOSE 79  --   --  89 79 116* 99  BUN 8  --   --  <5* <5* <5* <5*  CREATININE 1.05*  --   --  0.77 0.77 0.77 0.98  CALCIUM 7.1*  --   --  7.0* 7.5* 7.9* 8.4*  MG  --  1.2*  --  1.2* 2.1 2.1 1.9   GFR: Estimated Creatinine Clearance: 48.9 mL/min (by C-G formula based on SCr of 0.98 mg/dL). Liver Function Tests:  Recent Labs Lab 07/01/16 0411 07/02/16 0250  07/03/16 0258 07/04/16 0209 07/06/16 1245  AST 58* 54* 63* 71* 63*  ALT 23 19 23 25 30   ALKPHOS 117 90 93 99 91  BILITOT 4.3* 5.2* 5.2* 6.5* 6.4*  PROT 5.8* 5.1* 5.4* 6.3* 5.9*  ALBUMIN 2.2* 2.5* 2.2* 2.5* 2.3*    Recent Labs Lab 06/30/16 1823  07/06/16 1245  LIPASE 11 12    Recent Labs Lab 07/06/16 1435  AMMONIA 88*   Coagulation Profile:  Recent Labs Lab 07/01/16 0411 07/06/16 1245  INR 3.31 6.32*   Cardiac Enzymes:  Recent Labs Lab 07/06/16 1146  CKTOTAL 158   BNP (last 3 results) No results for input(s): PROBNP in the last 8760 hours. HbA1C: No results for input(s): HGBA1C in the last 72 hours. CBG:  Recent Labs Lab 07/01/16 1201 07/02/16 0927  GLUCAP 75 81   Lipid Profile: No results for input(s): CHOL, HDL, LDLCALC, TRIG, CHOLHDL, LDLDIRECT in the last 72 hours. Thyroid Function Tests: No results for input(s): TSH, T4TOTAL, FREET4, T3FREE, THYROIDAB in the last 72 hours. Anemia Panel: No results for input(s): VITAMINB12, FOLATE, FERRITIN, TIBC, IRON, RETICCTPCT in the last 72 hours. Urine analysis:    Component Value Date/Time   COLORURINE RED (A) 07/06/2016 1452   APPEARANCEUR CLEAR 07/06/2016 1452   LABSPEC 1.020 07/06/2016 1452   PHURINE 6.0 07/06/2016 1452   GLUCOSEU NEGATIVE 07/06/2016 1452   HGBUR NEGATIVE 07/06/2016 1452   BILIRUBINUR MODERATE (A) 07/06/2016 1452   KETONESUR 15 (A) 07/06/2016 1452   PROTEINUR NEGATIVE 07/06/2016 1452   UROBILINOGEN 1.0 07/03/2012 2021   NITRITE POSITIVE (A) 07/06/2016 1452   LEUKOCYTESUR SMALL (A) 07/06/2016 1452   Sepsis Labs: !!!!!!!!!!!!!!!!!!!!!!!!!!!!!!!!!!!!!!!!!!!! @LABRCNTIP (procalcitonin:4,lacticidven:4) ) Recent Results (from the past 240 hour(s))  Urine culture     Status: Abnormal   Collection Time: 06/30/16  8:55 PM  Result Value Ref Range Status   Specimen Description URINE, RANDOM  Final   Special Requests NONE  Final   Culture >=100,000 COLONIES/mL ENTEROCOCCUS FAECIUM (A)  Final   Report Status 07/03/2016 FINAL  Final   Organism ID, Bacteria ENTEROCOCCUS FAECIUM (A)  Final      Susceptibility   Enterococcus faecium - MIC*    AMPICILLIN <=2 SENSITIVE Sensitive     LEVOFLOXACIN 2 SENSITIVE Sensitive     NITROFURANTOIN 64  INTERMEDIATE Intermediate     VANCOMYCIN <=0.5 SENSITIVE Sensitive     * >=100,000 COLONIES/mL ENTEROCOCCUS FAECIUM  Culture, blood (x 2)     Status: None   Collection Time: 07/01/16  4:01 AM  Result Value Ref Range Status   Specimen Description BLOOD RIGHT HAND  Final   Special Requests IN PEDIATRIC BOTTLE 1ML  Final   Culture NO GROWTH 5 DAYS  Final   Report Status 07/06/2016 FINAL  Final  Culture, blood (x 2)     Status: None   Collection Time: 07/01/16  4:11 AM  Result Value Ref Range Status   Specimen Description BLOOD LEFT FOREARM  Final   Special Requests BOTTLES DRAWN AEROBIC AND ANAEROBIC 5ML  Final   Culture NO GROWTH 5 DAYS  Final   Report Status 07/06/2016 FINAL  Final  MRSA PCR Screening     Status: None   Collection Time: 07/01/16  7:56 AM  Result Value Ref Range Status   MRSA by PCR NEGATIVE NEGATIVE Final    Comment:        The GeneXpert MRSA Assay (FDA approved for NASAL specimens only), is one component of a comprehensive MRSA colonization surveillance  program. It is not intended to diagnose MRSA infection nor to guide or monitor treatment for MRSA infections.   Gastrointestinal Panel by PCR , Stool     Status: None   Collection Time: 07/01/16 12:11 PM  Result Value Ref Range Status   Campylobacter species NOT DETECTED NOT DETECTED Final   Plesimonas shigelloides NOT DETECTED NOT DETECTED Final   Salmonella species NOT DETECTED NOT DETECTED Final   Yersinia enterocolitica NOT DETECTED NOT DETECTED Final   Vibrio species NOT DETECTED NOT DETECTED Final   Vibrio cholerae NOT DETECTED NOT DETECTED Final   Enteroaggregative E coli (EAEC) NOT DETECTED NOT DETECTED Final   Enteropathogenic E coli (EPEC) NOT DETECTED NOT DETECTED Final   Enterotoxigenic E coli (ETEC) NOT DETECTED NOT DETECTED Final   Shiga like toxin producing E coli (STEC) NOT DETECTED NOT DETECTED Final   Shigella/Enteroinvasive E coli (EIEC) NOT DETECTED NOT DETECTED Final   Cryptosporidium  NOT DETECTED NOT DETECTED Final   Cyclospora cayetanensis NOT DETECTED NOT DETECTED Final   Entamoeba histolytica NOT DETECTED NOT DETECTED Final   Giardia lamblia NOT DETECTED NOT DETECTED Final   Adenovirus F40/41 NOT DETECTED NOT DETECTED Final   Astrovirus NOT DETECTED NOT DETECTED Final   Norovirus GI/GII NOT DETECTED NOT DETECTED Final   Rotavirus A NOT DETECTED NOT DETECTED Final   Sapovirus (I, II, IV, and V) NOT DETECTED NOT DETECTED Final  C difficile quick screen w PCR reflex     Status: None   Collection Time: 07/01/16 12:11 PM  Result Value Ref Range Status   C Diff antigen NEGATIVE NEGATIVE Final   C Diff toxin NEGATIVE NEGATIVE Final   C Diff interpretation No C. difficile detected.  Final  Gram stain     Status: None   Collection Time: 07/01/16  2:38 PM  Result Value Ref Range Status   Specimen Description PERITONEAL  Final   Special Requests NONE  Final   Gram Stain   Final    WBC PRESENT, PREDOMINANTLY MONONUCLEAR NO ORGANISMS SEEN CYTOSPIN SMEAR    Report Status 07/01/2016 FINAL  Final  Culture, body fluid-bottle     Status: None   Collection Time: 07/01/16  2:38 PM  Result Value Ref Range Status   Specimen Description PERITONEAL  Final   Special Requests NONE  Final   Culture NO GROWTH 5 DAYS  Final   Report Status 07/06/2016 FINAL  Final     Radiological Exams on Admission: Ct Head Wo Contrast  Result Date: 07/06/2016 CLINICAL DATA:  Unwitnessed fall.  Elevated INR level. EXAM: CT HEAD WITHOUT CONTRAST TECHNIQUE: Contiguous axial images were obtained from the base of the skull through the vertex without intravenous contrast. COMPARISON:  10/25/2006 FINDINGS: Brain: Stable mild cerebral atrophy. No evidence for acute hemorrhage, mass lesion, midline shift, hydrocephalus or large infarct. Vascular: No hyperdense vessel or unexpected calcification. Skull:  No fracture. Sinuses/Orbits: Changes compatible with bilateral maxillary antrostomy procedures. Mild  mucosal disease in visualized left maxillary sinus. Mild mucosal disease in the sphenoid sinuses. Normal appearance of the orbits. Other: None. IMPRESSION: No acute intracranial abnormality. Electronically Signed   By: Markus Daft M.D.   On: 07/06/2016 15:30   Dg Chest Portable 1 View  Result Date: 07/06/2016 CLINICAL DATA:  Cough, hypotension, recent fall EXAM: PORTABLE CHEST 1 VIEW COMPARISON:  Chest thickening 2017 FINDINGS: There is minimal opacity in the right mid lung and patchy pneumonia cannot be excluded. The lungs are not well aerated. No pleural effusion is  seen. Mediastinal and hilar contours are unremarkable. The heart is within normal limits in size. No bony abnormality is seen. IMPRESSION: Minimal opacity in the right mid lung may represent a patchy area of pneumonia. Consider followup Electronically Signed   By: Ivar Drape M.D.   On: 07/06/2016 13:02    EKG: sinus, non specific interventricular delay   Assessment/Plan Active Problems:   PANCREATITIS, CHRONIC    Abdominal pain, chronic, epigastric - from chronic pancreatitis   Alcoholic liver failure (HCC)   Liver failure (HCC)   Pneumonia   Metabolic acidosis   Thrombocytopenia (HCC)   Sepsis ([place])   1-Possible sepsis; presents with hypotension, source of infection possible PNA, chest x ray with right opacity,  Admit to step down unit.  IV fluids, IV vancomycin and cefepime.  Might need repeat chest x ray./   2-Metabolic acidosis;  IV bicarb Gtt.  Has history of chronic diarrhea.   3-Decompensated ESLD;   INR at 6 prior at 3. hyperbilirubinemia at 6 persist.  Elevated ammonia.  Start lactulose.  Check [place], to evaluate for ascites. PRN paracentesis.   4-CAD; will hold plavix and aspirin. Patient is too high risk for anticoagulation with decompensated liver failure.   5-Abdominal pain, chronic. Chronic pancreatitis.  [place]. Continue with pancreatic enzymes.    6-Thrombocytopenia; in setting liver failure and alcohol  use. Follow trend.    DVT prophylaxis: scd, elevated INR at 6 Code Status: full code per patient request  Family Communication: niece Thomia Mione Disposition Plan: might benefit from SNF  Consults called: none Admission status: inpatient, step down. Patient presents with possible sepsis, PNA, metabolic acidosis, , high risk for decompensation due to liver failure.    Elmarie Shiley MD [facility] Pager [phone]  If 7PM-7AM, please contact night-coverage [email or web address] Password [facility]  07/06/2016, 5:14 PM

## 2016-07-06 NOTE — ED Notes (Signed)
Niece Tristina Maclachlan would like to be called with any updates.  Number is [phone].

## 2016-07-06 NOTE — ED Notes (Signed)
Dinner tray ordered, heart healthy diet 
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
## 2016-07-07 DIAGNOSIS — R1013 Epigastric pain: Secondary | ICD-10-CM

## 2016-07-07 DIAGNOSIS — G8929 Other chronic pain: Secondary | ICD-10-CM

## 2016-07-07 DIAGNOSIS — K7200 Acute and subacute hepatic failure without coma: Secondary | ICD-10-CM

## 2016-07-07 DIAGNOSIS — D696 Thrombocytopenia, unspecified: Secondary | ICD-10-CM

## 2016-07-07 LAB — URINE CULTURE: CULTURE: NO GROWTH

## 2016-07-07 LAB — CBC
HEMATOCRIT: 22.7 % — AB (ref 36.0–46.0)
Hemoglobin: 7.7 g/dL — ABNORMAL LOW (ref 12.0–15.0)
MCH: 24.2 pg — ABNORMAL LOW (ref 26.0–34.0)
MCHC: 33.9 g/dL (ref 30.0–36.0)
MCV: 71.4 fL — ABNORMAL LOW (ref 78.0–100.0)
PLATELETS: 89 10*3/uL — AB (ref 150–400)
RBC: 3.18 MIL/uL — AB (ref 3.87–5.11)
RDW: 22.5 % — AB (ref 11.5–15.5)
WBC: 5.2 10*3/uL (ref 4.0–10.5)

## 2016-07-07 LAB — COMPREHENSIVE METABOLIC PANEL
ALT: 31 U/L (ref 14–54)
AST: 79 U/L — AB (ref 15–41)
Albumin: 2 g/dL — ABNORMAL LOW (ref 3.5–5.0)
Alkaline Phosphatase: 88 U/L (ref 38–126)
Anion gap: 6 (ref 5–15)
BILIRUBIN TOTAL: 5.3 mg/dL — AB (ref 0.3–1.2)
CO2: 19 mmol/L — ABNORMAL LOW (ref 22–32)
CREATININE: 0.98 mg/dL (ref 0.44–1.00)
Calcium: 7.7 mg/dL — ABNORMAL LOW (ref 8.9–10.3)
Chloride: 119 mmol/L — ABNORMAL HIGH (ref 101–111)
GFR, EST NON AFRICAN AMERICAN: 58 mL/min — AB (ref >60)
Glucose, Bld: 185 mg/dL — ABNORMAL HIGH (ref 65–99)
POTASSIUM: 3.5 mmol/L (ref 3.5–5.1)
Sodium: 144 mmol/L (ref 135–145)
TOTAL PROTEIN: 5.1 g/dL — AB (ref 6.5–8.1)

## 2016-07-07 LAB — GLUCOSE, CAPILLARY
GLUCOSE-CAPILLARY: 155 mg/dL — AB (ref 65–99)
Glucose-Capillary: 122 mg/dL — ABNORMAL HIGH (ref 65–99)

## 2016-07-07 LAB — APTT: aPTT: 64 seconds — ABNORMAL HIGH (ref 24–36)

## 2016-07-07 MED ORDER — LORAZEPAM 2 MG/ML IJ SOLN
2.0000 mg | INTRAMUSCULAR | Status: DC | PRN
  Administered 2016-07-09 – 2016-07-11 (×4): 2 mg via INTRAVENOUS
  Filled 2016-07-07 (×4): qty 1

## 2016-07-07 MED ORDER — SODIUM CHLORIDE 0.9 % IV BOLUS (SEPSIS)
500.0000 mL | Freq: Once | INTRAVENOUS | Status: AC
  Administered 2016-07-07: 500 mL via INTRAVENOUS

## 2016-07-07 MED ORDER — SODIUM CHLORIDE 0.9 % IV BOLUS (SEPSIS)
250.0000 mL | Freq: Once | INTRAVENOUS | Status: AC
  Administered 2016-07-07: 250 mL via INTRAVENOUS
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
## 2016-07-07 NOTE — Progress Notes (Signed)
Pt has low BP 81/60. 250 ml Nacl bolus given,  BP 95/69 upon finishing bolus BP again dropped 79/63. 500 ml Nacl bolus given. Now BP 94/64

## 2016-07-07 NOTE — Progress Notes (Signed)
PROGRESS NOTE    [name] Foley  [id number] DOB: 1946-10-12 DOA: 07/06/2016 PCP: Philis Fendt, MD   Brief Narrative: [name] Foley is a 70 y.o. female with a history of end-stage liver disease, alcohol abuse, chronic pancreatitis. She was recently admitted and discharged abdominal pain, diarrhea, hypertension, UTI. She presented this admission with hypotension and a fall.   Assessment & Plan:   Active Problems:   PANCREATITIS, CHRONIC    Abdominal pain, chronic, epigastric - from chronic pancreatitis   Alcoholic liver failure (HCC)   Liver failure (HCC)   Pneumonia   Metabolic acidosis   Thrombocytopenia (HCC)   Sepsis ([place])  ?Sepsis ?HCAP Hypotension Hypothermia Appears to be improved from yesterday. Status post multiple small fluid boluses. She is not tachycardic. Urine cultures negative for growth. -Continue IV fluids -Continue vancomycin IV and cefepime IV -Blood cultures not obtained on admission, will order now although patient started receiving antibiotics   Metabolic acidosis, non-anion gap Possibly secondary to loose stools. Patient received sodium bicarbonate yesterday. Has improved this morning. Unlikely secondary to compensation from respiratory alkalosis. -Follow-up tomorrow BMP  Decompensated ESLD Hyperbilirubinemia Patient's INR 6.32 on admission which is up from just 5 days ago (3.31). Patient is still drinking alcohol. Currently no evidence of bleeding. CT head was clear for intracranial hemorrhage. Ammonia elevated at 88. -Continue lactulose 30 g 3 times daily -Recheck INR tomorrow -SCDs for DVT prophylaxis  Abdominal pain, chronic Chronic pancreatitis Continue pancreatic enzymes. Right upper quadrant ultrasound significant for fatty infiltration versus other hepatic cellular disease. There is a small amount of perihepatic fluid.  Alcohol dependence -CIWA protocol  Thrombocytopenia Likely secondary to liver disease. Currently  stable. -Follow CBC  CAD We'll continue to hold Plavix and aspirin with patient's decompensated liver failure.   DVT prophylaxis: SCDs Code Status: Full code Family Communication: None at bedside Disposition Plan: Plan for SNF placement   Consultants:   None  Procedures:  None  Antimicrobials:  Vancomycin IV (9/13>>  Cefepime IV (9/13>>    Subjective: Patient reports some abdominal pain otherwise nothing overnight. She is a no 8 and wanted me to stop asking her questions. She did admit she does not feel that she can take care of herself at home.  Objective: Vitals:   07/07/16 0420 07/07/16 0432 07/07/16 0436 07/07/16 0500  BP:  (!) 87/72 (!) 79/63 (!) 76/52  Pulse:  92 91 88  Resp:  19 (!) 26 20  Temp: 97.9 F (36.6 C)     TempSrc: Oral     SpO2:  96% 93% 95%  Weight:      Height:        Intake/Output Summary (Last 24 hours) at 07/07/16 0615 Last data filed at 07/07/16 0509  Gross per 24 hour  Intake           3002.5 ml  Output              200 ml  Net           2802.5 ml   Filed Weights   07/06/16 1129 07/06/16 1900  Weight: 65.8 kg (145 lb) 68.3 kg (150 lb 9.2 oz)    Examination:  General exam: Appears calm and comfortable. Has a Retail banker. Chronically ill-appearing Respiratory system: Clear to auscultation. Respiratory effort normal. Cardiovascular system: S1 & S2 heard, RRR.  Gastrointestinal system: Abdomen is mildly distended, soft and with epigastric tenderness. Normal bowel sounds heard. Central nervous system: Alert and oriented.  Extremities: No  edema. No calf tenderness Skin: No cyanosis. No rashes Psychiatry: Judgement and insight appear normal. Mood & affect depressed    Data Reviewed: I have personally reviewed following labs and imaging studies  CBC:  Recent Labs Lab 06/30/16 1823 07/01/16 0411 07/02/16 0250 07/03/16 0258 07/04/16 0209  WBC 9.1 5.4 4.9 5.4 7.0  NEUTROABS 5.4  --   --   --   --   HGB 10.1* 7.4* 6.7*  8.2* 9.1*  HCT 29.6* 21.7* 19.2* 24.1* 26.2*  MCV 68.7* 68.0* 68.3* 71.7* 71.6*  PLT 265 150 111* 107* 123456*   Basic Metabolic Panel:  Recent Labs Lab 07/01/16 0411 07/01/16 1100 07/01/16 2128 07/02/16 0250 07/03/16 0258 07/04/16 0209 07/06/16 1245  NA 135  --   --  142 143 142 141  K 2.8*  --  4.4 3.7 4.0 3.7 3.5  CL 108  --   --  117* 117* 119* 115*  CO2 17*  --   --  18* 14* 15* 16*  GLUCOSE 79  --   --  89 79 116* 99  BUN 8  --   --  <5* <5* <5* <5*  CREATININE 1.05*  --   --  0.77 0.77 0.77 0.98  CALCIUM 7.1*  --   --  7.0* 7.5* 7.9* 8.4*  MG  --  1.2*  --  1.2* 2.1 2.1 1.9   GFR: Estimated Creatinine Clearance: 49.8 mL/min (by C-G formula based on SCr of 0.98 mg/dL). Liver Function Tests:  Recent Labs Lab 07/01/16 0411 07/02/16 0250 07/03/16 0258 07/04/16 0209 07/06/16 1245  AST 58* 54* 63* 71* 63*  ALT 23 19 23 25 30   ALKPHOS 117 90 93 99 91  BILITOT 4.3* 5.2* 5.2* 6.5* 6.4*  PROT 5.8* 5.1* 5.4* 6.3* 5.9*  ALBUMIN 2.2* 2.5* 2.2* 2.5* 2.3*    Recent Labs Lab 06/30/16 1823 07/06/16 1245  LIPASE 11 12    Recent Labs Lab 07/06/16 1435  AMMONIA 88*   Coagulation Profile:  Recent Labs Lab 07/01/16 0411 07/06/16 1245  INR 3.31 6.32*   Cardiac Enzymes:  Recent Labs Lab 07/06/16 1146  CKTOTAL 158   BNP (last 3 results) No results for input(s): PROBNP in the last 8760 hours. HbA1C: No results for input(s): HGBA1C in the last 72 hours. CBG:  Recent Labs Lab 07/01/16 1201 07/02/16 0927 07/06/16 2020 07/06/16 2328  GLUCAP 75 81 136* 149*   Lipid Profile: No results for input(s): CHOL, HDL, LDLCALC, TRIG, CHOLHDL, LDLDIRECT in the last 72 hours. Thyroid Function Tests: No results for input(s): TSH, T4TOTAL, FREET4, T3FREE, THYROIDAB in the last 72 hours. Anemia Panel: No results for input(s): VITAMINB12, FOLATE, FERRITIN, TIBC, IRON, RETICCTPCT in the last 72 hours. Sepsis Labs:  Recent Labs Lab 06/30/16 1823  07/01/16 0718  07/02/16 0250 07/03/16 0258 07/06/16 1343 07/06/16 1504  PROCALCITON 0.43  --   --  0.39 0.42  --   --   LATICACIDVEN  --   < > 2.8* 1.4  --  1.94* 1.06  < > = values in this interval not displayed.  Recent Results (from the past 240 hour(s))  Urine culture     Status: Abnormal   Collection Time: 06/30/16  8:55 PM  Result Value Ref Range Status   Specimen Description URINE, RANDOM  Final   Special Requests NONE  Final   Culture >=100,000 COLONIES/mL ENTEROCOCCUS FAECIUM (A)  Final   Report Status 07/03/2016 FINAL  Final   Organism ID, Bacteria ENTEROCOCCUS FAECIUM (  A)  Final      Susceptibility   Enterococcus faecium - MIC*    AMPICILLIN <=2 SENSITIVE Sensitive     LEVOFLOXACIN 2 SENSITIVE Sensitive     NITROFURANTOIN 64 INTERMEDIATE Intermediate     VANCOMYCIN <=0.5 SENSITIVE Sensitive     * >=100,000 COLONIES/mL ENTEROCOCCUS FAECIUM  Culture, blood (x 2)     Status: None   Collection Time: 07/01/16  4:01 AM  Result Value Ref Range Status   Specimen Description BLOOD RIGHT HAND  Final   Special Requests IN PEDIATRIC BOTTLE 1ML  Final   Culture NO GROWTH 5 DAYS  Final   Report Status 07/06/2016 FINAL  Final  Culture, blood (x 2)     Status: None   Collection Time: 07/01/16  4:11 AM  Result Value Ref Range Status   Specimen Description BLOOD LEFT FOREARM  Final   Special Requests BOTTLES DRAWN AEROBIC AND ANAEROBIC 5ML  Final   Culture NO GROWTH 5 DAYS  Final   Report Status 07/06/2016 FINAL  Final  MRSA PCR Screening     Status: None   Collection Time: 07/01/16  7:56 AM  Result Value Ref Range Status   MRSA by PCR NEGATIVE NEGATIVE Final    Comment:        The GeneXpert MRSA Assay (FDA approved for NASAL specimens only), is one component of a comprehensive MRSA colonization surveillance program. It is not intended to diagnose MRSA infection nor to guide or monitor treatment for MRSA infections.   Gastrointestinal Panel by PCR , Stool     Status: None    Collection Time: 07/01/16 12:11 PM  Result Value Ref Range Status   Campylobacter species NOT DETECTED NOT DETECTED Final   Plesimonas shigelloides NOT DETECTED NOT DETECTED Final   Salmonella species NOT DETECTED NOT DETECTED Final   Yersinia enterocolitica NOT DETECTED NOT DETECTED Final   Vibrio species NOT DETECTED NOT DETECTED Final   Vibrio cholerae NOT DETECTED NOT DETECTED Final   Enteroaggregative E coli (EAEC) NOT DETECTED NOT DETECTED Final   Enteropathogenic E coli (EPEC) NOT DETECTED NOT DETECTED Final   Enterotoxigenic E coli (ETEC) NOT DETECTED NOT DETECTED Final   Shiga like toxin producing E coli (STEC) NOT DETECTED NOT DETECTED Final   Shigella/Enteroinvasive E coli (EIEC) NOT DETECTED NOT DETECTED Final   Cryptosporidium NOT DETECTED NOT DETECTED Final   Cyclospora cayetanensis NOT DETECTED NOT DETECTED Final   Entamoeba histolytica NOT DETECTED NOT DETECTED Final   Giardia lamblia NOT DETECTED NOT DETECTED Final   Adenovirus F40/41 NOT DETECTED NOT DETECTED Final   Astrovirus NOT DETECTED NOT DETECTED Final   Norovirus GI/GII NOT DETECTED NOT DETECTED Final   Rotavirus A NOT DETECTED NOT DETECTED Final   Sapovirus (I, II, IV, and V) NOT DETECTED NOT DETECTED Final  C difficile quick screen w PCR reflex     Status: None   Collection Time: 07/01/16 12:11 PM  Result Value Ref Range Status   C Diff antigen NEGATIVE NEGATIVE Final   C Diff toxin NEGATIVE NEGATIVE Final   C Diff interpretation No C. difficile detected.  Final  Gram stain     Status: None   Collection Time: 07/01/16  2:38 PM  Result Value Ref Range Status   Specimen Description PERITONEAL  Final   Special Requests NONE  Final   Gram Stain   Final    WBC PRESENT, PREDOMINANTLY MONONUCLEAR NO ORGANISMS SEEN CYTOSPIN SMEAR    Report Status 07/01/2016 FINAL  Final  Culture, body fluid-bottle     Status: None   Collection Time: 07/01/16  2:38 PM  Result Value Ref Range Status   Specimen Description  PERITONEAL  Final   Special Requests NONE  Final   Culture NO GROWTH 5 DAYS  Final   Report Status 07/06/2016 FINAL  Final  MRSA PCR Screening     Status: None   Collection Time: 07/06/16  7:57 PM  Result Value Ref Range Status   MRSA by PCR NEGATIVE NEGATIVE Final    Comment:        The GeneXpert MRSA Assay (FDA approved for NASAL specimens only), is one component of a comprehensive MRSA colonization surveillance program. It is not intended to diagnose MRSA infection nor to guide or monitor treatment for MRSA infections.          Radiology Studies: Ct Head Wo Contrast  Result Date: 07/06/2016 CLINICAL DATA:  Unwitnessed fall.  Elevated INR level. EXAM: CT HEAD WITHOUT CONTRAST TECHNIQUE: Contiguous axial images were obtained from the base of the skull through the vertex without intravenous contrast. COMPARISON:  10/25/2006 FINDINGS: Brain: Stable mild cerebral atrophy. No evidence for acute hemorrhage, mass lesion, midline shift, hydrocephalus or large infarct. Vascular: No hyperdense vessel or unexpected calcification. Skull:  No fracture. Sinuses/Orbits: Changes compatible with bilateral maxillary antrostomy procedures. Mild mucosal disease in visualized left maxillary sinus. Mild mucosal disease in the sphenoid sinuses. Normal appearance of the orbits. Other: None. IMPRESSION: No acute intracranial abnormality. Electronically Signed   By: Markus Daft M.D.   On: 07/06/2016 15:30   US Abdomen Limited  Result Date: 07/06/2016 CLINICAL DATA:  Abdominal distention. EXAM: US ABDOMEN LIMITED - RIGHT UPPER QUADRANT COMPARISON:  CT 07/01/2016 FINDINGS: Gallbladder: Cholecystectomy. Common bile duct: Diameter: 4.9 mm Liver: Diffusely echogenic hepatic parenchyma consistent with fatty infiltration or other hepatic cellular disease. No focal liver lesion is evident.Small amount of perihepatic fluid. IMPRESSION: Diffuse echogenicity of hepatic parenchyma consistent with fatty infiltration or  other hepatic cellular disease. No bile duct dilatation. Small amount of perihepatic fluid. Electronically Signed   By: Andreas Newport M.D.   On: 07/06/2016 22:35   Dg Chest Portable 1 View  Result Date: 07/06/2016 CLINICAL DATA:  Cough, hypotension, recent fall EXAM: PORTABLE CHEST 1 VIEW COMPARISON:  Chest thickening 2017 FINDINGS: There is minimal opacity in the right mid lung and patchy pneumonia cannot be excluded. The lungs are not well aerated. No pleural effusion is seen. Mediastinal and hilar contours are unremarkable. The heart is within normal limits in size. No bony abnormality is seen. IMPRESSION: Minimal opacity in the right mid lung may represent a patchy area of pneumonia. Consider followup Electronically Signed   By: Ivar Drape M.D.   On: 07/06/2016 13:02        Scheduled Meds: . ceFEPime (MAXIPIME) IV  2 g Intravenous Q24H  . feeding supplement (ENSURE ENLIVE)  237 mL Oral TID BM  . ferrous gluconate  324 mg Oral Q breakfast  . folic acid  1 mg Oral Daily  . Influenza vac split quadrivalent PF  0.5 mL Intramuscular Tomorrow-1000  . lactulose  30 g Oral TID  . lipase/protease/amylase  36,000 Units Oral TID WC  . Magnesium Gluconate  500 mg Oral [id number]  . mirtazapine  15 mg Oral QHS  . multivitamin with minerals  1 tablet Oral Daily  . nicotine  14 mg Transdermal Q24H  . pantoprazole  40 mg Oral Daily  . phytonadione  5 mg  Oral Daily  . QUEtiapine  50 mg Oral QHS  . thiamine  100 mg Oral Daily  . vancomycin  500 mg Intravenous Q12H   Continuous Infusions: .  sodium bicarbonate  infusion 1000 mL 50 mL/hr at 07/06/16 1733     LOS: 1 day     Cordelia Poche [facility] 07/07/2016, 6:15 AM Pager: [phone]  If 7PM-7AM, please contact night-coverage [email or web address] Password [facility] 07/07/2016, 6:15 AM

## 2016-07-08 DIAGNOSIS — J189 Pneumonia, unspecified organism: Secondary | ICD-10-CM

## 2016-07-08 DIAGNOSIS — R7989 Other specified abnormal findings of blood chemistry: Secondary | ICD-10-CM

## 2016-07-08 DIAGNOSIS — I959 Hypotension, unspecified: Secondary | ICD-10-CM

## 2016-07-08 LAB — CBC
HEMATOCRIT: 25.4 % — AB (ref 36.0–46.0)
HEMOGLOBIN: 9 g/dL — AB (ref 12.0–15.0)
MCH: 24.7 pg — ABNORMAL LOW (ref 26.0–34.0)
MCHC: 35.4 g/dL (ref 30.0–36.0)
MCV: 69.6 fL — ABNORMAL LOW (ref 78.0–100.0)
Platelets: 60 10*3/uL — ABNORMAL LOW (ref 150–400)
RBC: 3.65 MIL/uL — AB (ref 3.87–5.11)
RDW: 22.2 % — ABNORMAL HIGH (ref 11.5–15.5)
WBC: 8.2 10*3/uL (ref 4.0–10.5)

## 2016-07-08 LAB — COMPREHENSIVE METABOLIC PANEL
ALBUMIN: 1.9 g/dL — AB (ref 3.5–5.0)
ALT: 39 U/L (ref 14–54)
ANION GAP: 7 (ref 5–15)
AST: 105 U/L — ABNORMAL HIGH (ref 15–41)
Alkaline Phosphatase: 97 U/L (ref 38–126)
BILIRUBIN TOTAL: 6.4 mg/dL — AB (ref 0.3–1.2)
BUN: <5 mg/dL — ABNORMAL LOW (ref 6–20)
CO2: 19 mmol/L — ABNORMAL LOW (ref 22–32)
Calcium: 7.9 mg/dL — ABNORMAL LOW (ref 8.9–10.3)
Chloride: 117 mmol/L — ABNORMAL HIGH (ref 101–111)
Creatinine, Ser: 1.11 mg/dL — ABNORMAL HIGH (ref 0.44–1.00)
GFR, EST AFRICAN AMERICAN: 58 mL/min — AB (ref >60)
GFR, EST NON AFRICAN AMERICAN: 50 mL/min — AB (ref >60)
GLUCOSE: 141 mg/dL — AB (ref 65–99)
POTASSIUM: 3.2 mmol/L — AB (ref 3.5–5.1)
Sodium: 143 mmol/L (ref 135–145)
TOTAL PROTEIN: 5.7 g/dL — AB (ref 6.5–8.1)

## 2016-07-08 LAB — PROTIME-INR
INR: 6
PROTHROMBIN TIME: 55.4 s — AB (ref 11.4–15.2)

## 2016-07-08 MED ORDER — WHITE PETROLATUM GEL
Status: AC
  Administered 2016-07-08: 04:00:00
  Filled 2016-07-08: qty 1

## 2016-07-08 MED ORDER — POTASSIUM CHLORIDE CRYS ER 20 MEQ PO TBCR
40.0000 meq | EXTENDED_RELEASE_TABLET | Freq: Once | ORAL | Status: AC
  Administered 2016-07-08: 40 meq via ORAL
  Filled 2016-07-08: qty 2

## 2016-07-08 MED ORDER — ZOLPIDEM TARTRATE 5 MG PO TABS
5.0000 mg | ORAL_TABLET | Freq: Once | ORAL | Status: AC
  Administered 2016-07-08: 5 mg via ORAL
  Filled 2016-07-08: qty 1

## 2016-07-08 NOTE — Progress Notes (Signed)
CSW met with patient and patient niece at bedside to provide bed offers- patient would like [facility] at time of [place] is able to accept patient over the weekend if medically stable  CSW will continue to follow  Jorge Ny, [place] Social Worker [phone]

## 2016-07-08 NOTE — Progress Notes (Signed)
PROGRESS NOTE    [name] Foley  [id number] DOB: 25-Nov-1945 DOA: 07/06/2016 PCP: Philis Fendt, MD   Brief Narrative: [name] Ardito is a 70 y.o. female with a history of end-stage liver disease, alcohol abuse, chronic pancreatitis. She was recently admitted and discharged abdominal pain, diarrhea, hypertension, UTI. She presented this admission with hypotension and a fall.   Assessment & Plan:   Active Problems:   PANCREATITIS, CHRONIC    Abdominal pain, chronic, epigastric - from chronic pancreatitis   Alcoholic liver failure (HCC)   Liver failure (HCC)   Pneumonia   Metabolic acidosis   Thrombocytopenia (HCC)   Sepsis ([place])  ?Sepsis ?HCAP Hypotension Hypothermia  Appears she is improved from yesterday. Status post multiple small fluid boluses. She is not tachycardic. Urine cultures negative for growth. Hypothermia resolved. Hypotension is stable -Hold IV fluids -Continue vancomycin IV and cefepime IV -Follow-up blood cultures; these were not obtained at admission and are status post antibiotic treatment  Metabolic acidosis, non-anion gap Possibly secondary to loose stools. Patient received sodium bicarbonate on admission. Continues to improve -Follow-up tomorrow BMP  Decompensated ESLD Hyperbilirubinemia Patient's INR 6.32 on admission which is up from discharge last admission. INR pending this morning Patient is still drinking alcohol. Currently no evidence of bleeding. CT head was clear for intracranial hemorrhage. Ammonia elevated at 88. Patient is not encephalopathic -Continue lactulose 30 g 3 times daily -Follow-up INR today -SCDs for DVT prophylaxis  Abdominal pain, chronic Chronic pancreatitis Continue pancreatic enzymes. Right upper quadrant ultrasound significant for fatty infiltration versus other hepatic cellular disease. There is a small amount of perihepatic fluid. Pain is improved.  Alcohol dependence -CIWA  protocol  Thrombocytopenia Likely secondary to liver disease. Currently stable. -Follow CBC  CAD Will continue to hold Plavix and aspirin with patient's decompensated liver failure.  Prolonged QTc >575ms -hold seroquel -telemetry -avoid QTc prolonging medications   DVT prophylaxis: SCDs Code Status: Full code Family Communication: None at bedside Disposition Plan: Plan for SNF placement; transfer to telemetry today   Consultants:   None  Procedures:  None  Antimicrobials:  Vancomycin IV (9/13>>  Cefepime IV (9/13>>    Subjective: Patient reports feeling better today but still very weak.  Objective: Vitals:   07/07/16 2005 07/08/16 0000 07/08/16 0100 07/08/16 0400  BP: 96/68 98/66 96/70  90/69  Pulse: 82 96 86 89  Resp: (!) 27 (!) 21 19 18   Temp: 98.6 F (37 C) 99.1 F (37.3 C)  98.5 F (36.9 C)  TempSrc: Oral Oral  Oral  SpO2: 96% 90% 95% 94%  Weight:      Height:        Intake/Output Summary (Last 24 hours) at 07/08/16 0714 Last data filed at 07/08/16 0600  Gross per 24 hour  Intake             1620 ml  Output                0 ml  Net             1620 ml   Filed Weights   07/06/16 1129 07/06/16 1900  Weight: 65.8 kg (145 lb) 68.3 kg (150 lb 9.2 oz)    Examination:  General exam: Appears calm and comfortable. Chronically ill-appearing Respiratory system: Clear to auscultation. Respiratory effort normal. Cardiovascular system: S1 & S2 heard, RRR.  Gastrointestinal system: Abdomen is mildly distended, soft and with epigastric tenderness. Normal bowel sounds heard. Central nervous system: Alert and oriented.  Extremities:  Pitting edema bilateral lower extremities. No calf tenderness Skin: No cyanosis. No rashes Psychiatry: Judgement and insight appear normal. Mood & affect depressed    Data Reviewed: I have personally reviewed following labs and imaging studies  CBC:  Recent Labs Lab 07/02/16 0250 07/03/16 0258 07/04/16 0209  07/07/16 0703  WBC 4.9 5.4 7.0 5.2  HGB 6.7* 8.2* 9.1* 7.7*  HCT 19.2* 24.1* 26.2* 22.7*  MCV 68.3* 71.7* 71.6* 71.4*  PLT 111* 107* 113* 89*   Basic Metabolic Panel:  Recent Labs Lab 07/01/16 1100  07/02/16 0250 07/03/16 0258 07/04/16 0209 07/06/16 1245 07/07/16 0703  NA  --   --  142 143 142 141 144  K  --   < > 3.7 4.0 3.7 3.5 3.5  CL  --   --  117* 117* 119* 115* 119*  CO2  --   --  18* 14* 15* 16* 19*  GLUCOSE  --   --  89 79 116* 99 185*  BUN  --   --  <5* <5* <5* <5* <5*  CREATININE  --   --  0.77 0.77 0.77 0.98 0.98  CALCIUM  --   --  7.0* 7.5* 7.9* 8.4* 7.7*  MG 1.2*  --  1.2* 2.1 2.1 1.9  --   < > = values in this interval not displayed. GFR: Estimated Creatinine Clearance: 49.8 mL/min (by C-G formula based on SCr of 0.98 mg/dL). Liver Function Tests:  Recent Labs Lab 07/02/16 0250 07/03/16 0258 07/04/16 0209 07/06/16 1245 07/07/16 0703  AST 54* 63* 71* 63* 79*  ALT 19 23 25 30 31   ALKPHOS 90 93 99 91 88  BILITOT 5.2* 5.2* 6.5* 6.4* 5.3*  PROT 5.1* 5.4* 6.3* 5.9* 5.1*  ALBUMIN 2.5* 2.2* 2.5* 2.3* 2.0*    Recent Labs Lab 07/06/16 1245  LIPASE 12    Recent Labs Lab 07/06/16 1435  AMMONIA 88*   Coagulation Profile:  Recent Labs Lab 07/06/16 1245  INR 6.32*   Cardiac Enzymes:  Recent Labs Lab 07/06/16 1146  CKTOTAL 158   BNP (last 3 results) No results for input(s): PROBNP in the last 8760 hours. HbA1C: No results for input(s): HGBA1C in the last 72 hours. CBG:  Recent Labs Lab 07/02/16 0927 07/06/16 1848 07/06/16 2020 07/06/16 2328 07/07/16 2100  GLUCAP 81 122* 136* 149* 155*   Lipid Profile: No results for input(s): CHOL, HDL, LDLCALC, TRIG, CHOLHDL, LDLDIRECT in the last 72 hours. Thyroid Function Tests: No results for input(s): TSH, T4TOTAL, FREET4, T3FREE, THYROIDAB in the last 72 hours. Anemia Panel: No results for input(s): VITAMINB12, FOLATE, FERRITIN, TIBC, IRON, RETICCTPCT in the last 72 hours. Sepsis  Labs:  Recent Labs Lab 07/01/16 0718 07/02/16 0250 07/03/16 0258 07/06/16 1343 07/06/16 1504  PROCALCITON  --  0.39 0.42  --   --   LATICACIDVEN 2.8* 1.4  --  1.94* 1.06    Recent Results (from the past 240 hour(s))  Urine culture     Status: Abnormal   Collection Time: 06/30/16  8:55 PM  Result Value Ref Range Status   Specimen Description URINE, RANDOM  Final   Special Requests NONE  Final   Culture >=100,000 COLONIES/mL ENTEROCOCCUS FAECIUM (A)  Final   Report Status 07/03/2016 FINAL  Final   Organism ID, Bacteria ENTEROCOCCUS FAECIUM (A)  Final      Susceptibility   Enterococcus faecium - MIC*    AMPICILLIN <=2 SENSITIVE Sensitive     LEVOFLOXACIN 2 SENSITIVE Sensitive  NITROFURANTOIN 64 INTERMEDIATE Intermediate     VANCOMYCIN <=0.5 SENSITIVE Sensitive     * >=100,000 COLONIES/mL ENTEROCOCCUS FAECIUM  Culture, blood (x 2)     Status: None   Collection Time: 07/01/16  4:01 AM  Result Value Ref Range Status   Specimen Description BLOOD RIGHT HAND  Final   Special Requests IN PEDIATRIC BOTTLE 1ML  Final   Culture NO GROWTH 5 DAYS  Final   Report Status 07/06/2016 FINAL  Final  Culture, blood (x 2)     Status: None   Collection Time: 07/01/16  4:11 AM  Result Value Ref Range Status   Specimen Description BLOOD LEFT FOREARM  Final   Special Requests BOTTLES DRAWN AEROBIC AND ANAEROBIC 5ML  Final   Culture NO GROWTH 5 DAYS  Final   Report Status 07/06/2016 FINAL  Final  MRSA PCR Screening     Status: None   Collection Time: 07/01/16  7:56 AM  Result Value Ref Range Status   MRSA by PCR NEGATIVE NEGATIVE Final    Comment:        The GeneXpert MRSA Assay (FDA approved for NASAL specimens only), is one component of a comprehensive MRSA colonization surveillance program. It is not intended to diagnose MRSA infection nor to guide or monitor treatment for MRSA infections.   Gastrointestinal Panel by PCR , Stool     Status: None   Collection Time: 07/01/16 12:11  PM  Result Value Ref Range Status   Campylobacter species NOT DETECTED NOT DETECTED Final   Plesimonas shigelloides NOT DETECTED NOT DETECTED Final   Salmonella species NOT DETECTED NOT DETECTED Final   Yersinia enterocolitica NOT DETECTED NOT DETECTED Final   Vibrio species NOT DETECTED NOT DETECTED Final   Vibrio cholerae NOT DETECTED NOT DETECTED Final   Enteroaggregative E coli (EAEC) NOT DETECTED NOT DETECTED Final   Enteropathogenic E coli (EPEC) NOT DETECTED NOT DETECTED Final   Enterotoxigenic E coli (ETEC) NOT DETECTED NOT DETECTED Final   Shiga like toxin producing E coli (STEC) NOT DETECTED NOT DETECTED Final   Shigella/Enteroinvasive E coli (EIEC) NOT DETECTED NOT DETECTED Final   Cryptosporidium NOT DETECTED NOT DETECTED Final   Cyclospora cayetanensis NOT DETECTED NOT DETECTED Final   Entamoeba histolytica NOT DETECTED NOT DETECTED Final   Giardia lamblia NOT DETECTED NOT DETECTED Final   Adenovirus F40/41 NOT DETECTED NOT DETECTED Final   Astrovirus NOT DETECTED NOT DETECTED Final   Norovirus GI/GII NOT DETECTED NOT DETECTED Final   Rotavirus A NOT DETECTED NOT DETECTED Final   Sapovirus (I, II, IV, and V) NOT DETECTED NOT DETECTED Final  C difficile quick screen w PCR reflex     Status: None   Collection Time: 07/01/16 12:11 PM  Result Value Ref Range Status   C Diff antigen NEGATIVE NEGATIVE Final   C Diff toxin NEGATIVE NEGATIVE Final   C Diff interpretation No C. difficile detected.  Final  Gram stain     Status: None   Collection Time: 07/01/16  2:38 PM  Result Value Ref Range Status   Specimen Description PERITONEAL  Final   Special Requests NONE  Final   Gram Stain   Final    WBC PRESENT, PREDOMINANTLY MONONUCLEAR NO ORGANISMS SEEN CYTOSPIN SMEAR    Report Status 07/01/2016 FINAL  Final  Culture, body fluid-bottle     Status: None   Collection Time: 07/01/16  2:38 PM  Result Value Ref Range Status   Specimen Description PERITONEAL  Final  Special  Requests NONE  Final   Culture NO GROWTH 5 DAYS  Final   Report Status 07/06/2016 FINAL  Final  Urine culture     Status: None   Collection Time: 07/06/16  2:52 PM  Result Value Ref Range Status   Specimen Description URINE, RANDOM  Final   Special Requests NONE  Final   Culture NO GROWTH  Final   Report Status 07/07/2016 FINAL  Final  MRSA PCR Screening     Status: None   Collection Time: 07/06/16  7:57 PM  Result Value Ref Range Status   MRSA by PCR NEGATIVE NEGATIVE Final    Comment:        The GeneXpert MRSA Assay (FDA approved for NASAL specimens only), is one component of a comprehensive MRSA colonization surveillance program. It is not intended to diagnose MRSA infection nor to guide or monitor treatment for MRSA infections.          Radiology Studies: Ct Head Wo Contrast  Result Date: 07/06/2016 CLINICAL DATA:  Unwitnessed fall.  Elevated INR level. EXAM: CT HEAD WITHOUT CONTRAST TECHNIQUE: Contiguous axial images were obtained from the base of the skull through the vertex without intravenous contrast. COMPARISON:  10/25/2006 FINDINGS: Brain: Stable mild cerebral atrophy. No evidence for acute hemorrhage, mass lesion, midline shift, hydrocephalus or large infarct. Vascular: No hyperdense vessel or unexpected calcification. Skull:  No fracture. Sinuses/Orbits: Changes compatible with bilateral maxillary antrostomy procedures. Mild mucosal disease in visualized left maxillary sinus. Mild mucosal disease in the sphenoid sinuses. Normal appearance of the orbits. Other: None. IMPRESSION: No acute intracranial abnormality. Electronically Signed   By: Markus Daft M.D.   On: 07/06/2016 15:30   US Abdomen Limited  Result Date: 07/06/2016 CLINICAL DATA:  Abdominal distention. EXAM: US ABDOMEN LIMITED - RIGHT UPPER QUADRANT COMPARISON:  CT 07/01/2016 FINDINGS: Gallbladder: Cholecystectomy. Common bile duct: Diameter: 4.9 mm Liver: Diffusely echogenic hepatic parenchyma consistent  with fatty infiltration or other hepatic cellular disease. No focal liver lesion is evident.Small amount of perihepatic fluid. IMPRESSION: Diffuse echogenicity of hepatic parenchyma consistent with fatty infiltration or other hepatic cellular disease. No bile duct dilatation. Small amount of perihepatic fluid. Electronically Signed   By: Andreas Newport M.D.   On: 07/06/2016 22:35   Dg Chest Portable 1 View  Result Date: 07/06/2016 CLINICAL DATA:  Cough, hypotension, recent fall EXAM: PORTABLE CHEST 1 VIEW COMPARISON:  Chest thickening 2017 FINDINGS: There is minimal opacity in the right mid lung and patchy pneumonia cannot be excluded. The lungs are not well aerated. No pleural effusion is seen. Mediastinal and hilar contours are unremarkable. The heart is within normal limits in size. No bony abnormality is seen. IMPRESSION: Minimal opacity in the right mid lung may represent a patchy area of pneumonia. Consider followup Electronically Signed   By: Ivar Drape M.D.   On: 07/06/2016 13:02        Scheduled Meds: . ceFEPime (MAXIPIME) IV  2 g Intravenous Q24H  . feeding supplement (ENSURE ENLIVE)  237 mL Oral TID BM  . ferrous gluconate  324 mg Oral Q breakfast  . folic acid  1 mg Oral Daily  . Influenza vac split quadrivalent PF  0.5 mL Intramuscular Tomorrow-1000  . lactulose  30 g Oral TID  . lipase/protease/amylase  36,000 Units Oral TID WC  . Magnesium Gluconate  500 mg Oral BH-q7a  . mirtazapine  15 mg Oral QHS  . multivitamin with minerals  1 tablet Oral Daily  . nicotine  14 mg Transdermal Q24H  . pantoprazole  40 mg Oral Daily  . thiamine  100 mg Oral Daily  . vancomycin  500 mg Intravenous Q12H   Continuous Infusions: .  sodium bicarbonate  infusion 1000 mL 50 mL/hr at 07/07/16 1623     LOS: 2 days     Cordelia Poche [facility] 07/08/2016, 7:14 AM Pager: [phone]  If 7PM-7AM, please contact night-coverage [email or web address] Password [facility] 07/08/2016, 7:14 AM

## 2016-07-08 NOTE — Evaluation (Signed)
Physical Therapy Evaluation Patient Details Name: [name] Foley MRN: [id number] DOB: 1945/12/17 Today's Date: 07/08/2016   History of Present Illness  [name] Acres is a 70 y.o. female with a history of end-stage liver disease, alcohol abuse, chronic pancreatitis. She was recently admitted and discharged abdominal pain, diarrhea, hypertension, UTI. She presented this admission with hypotension and a fall.  Clinical Impression  Pt admitted with above diagnosis. Pt currently with functional limitations due to the deficits listed below (see PT Problem List). Pt very deconditioned.  Will need SNF.   Pt will benefit from skilled PT to increase their independence and safety with mobility to allow discharge to the venue listed below.      Follow Up Recommendations SNF;Supervision/Assistance - 24 hour    Equipment Recommendations  Other (comment) (TBA)    Recommendations for Other Services       Precautions / Restrictions Precautions Precautions: Fall Precaution Comments: monitor HR Restrictions Weight Bearing Restrictions: No      Mobility  Bed Mobility Overal bed mobility: Needs Assistance Bed Mobility: Supine to Sit     Supine to sit: Min assist     General bed mobility comments: Needed assist for LEs and elevation of trunk.  Transfers Overall transfer level: Needs assistance Equipment used: 2 person hand held assist Transfers: Sit to/from [facility] Sit to Stand: Mod assist Stand pivot transfers: Mod assist       General transfer comment: mod assist to power up and to steady pt once up.  Pt with very poor safety awareness overall needing mod assist to pivot from bed to recliner.  Pt maintained flexed posture.   Ambulation/Gait                Stairs            Wheelchair Mobility    Modified Rankin (Stroke Patients Only)       Balance Overall balance assessment: Needs assistance;History of Falls Sitting-balance support:  Bilateral upper extremity supported;Feet supported Sitting balance-Leahy Scale: Poor Sitting balance - Comments: needed min guard assist to sit EOB.   Standing balance support: Bilateral upper extremity supported;During functional activity Standing balance-Leahy Scale: Poor Standing balance comment: could not stand without bil UE support and did not stand fully upright                             Pertinent Vitals/Pain Pain Assessment: No/denies pain  VSS    Home Living Family/patient expects to be discharged to:: Private residence (home per pt) Living Arrangements: Alone Available Help at Discharge: Neighbor Type of Home: Apartment Home Access: Stairs to enter Entrance Stairs-Rails: Psychiatric nurse of Steps: 3 Home Layout: One level Home Equipment: Cane - single point;Walker - standard      Prior Function Level of Independence: Independent with assistive device(s)         Comments: used cane and SW per pt.  Sponge bathed as well.  Pt poor historian.      Hand Dominance        Extremity/Trunk Assessment   Upper Extremity Assessment: Defer to OT evaluation           Lower Extremity Assessment: Generalized weakness      Cervical / Trunk Assessment: Kyphotic  Communication   Communication: No difficulties  Cognition Arousal/Alertness: Awake/alert Behavior During Therapy: WFL for tasks assessed/performed;Flat affect Overall Cognitive Status: No family/caregiver present to determine baseline cognitive functioning  General Comments General comments (skin integrity, edema, etc.): Pt had loose stool - notified nursing.  Everytime that pt tried to get up, she would start going again.  By the time pt finished she could barely stand long enough to be cleaned and needed incr asssit to pivot back to bed.  Total assist to clean pt.     Exercises     Assessment/Plan    PT Assessment Patient needs continued PT  services  PT Problem List Decreased strength;Decreased activity tolerance;Decreased balance;Decreased mobility;Decreased knowledge of use of DME;Pain       PT Diagnosis      PT Treatment Interventions DME instruction;Gait training;Functional mobility training;Therapeutic activities;Therapeutic exercise;Patient/family education    PT Goals (Current goals can be found in the Care Plan section)  Acute Rehab PT Goals Patient Stated Goal: None stated today PT Goal Formulation: With patient Time For Goal Achievement: 07/22/16 Potential to Achieve Goals: Fair    Frequency Min 3X/week   Barriers to discharge Decreased caregiver support      Co-evaluation               End of Session Equipment Utilized During Treatment: Gait belt Activity Tolerance: Patient limited by fatigue;Patient limited by pain Patient left: in bed;with call bell/phone within reach;with bed alarm set Nurse Communication: Mobility status (notified nursing of loose stool)         Time: [id number] PT Time Calculation (min) (ACUTE ONLY): 23 min   Charges:   PT Evaluation $PT Eval Moderate Complexity: 1 Procedure PT Treatments $Self Care/Home Management: 8-22   PT G CodesIrwin Brakeman F 07/14/16, 11:38 AM Amanda [facility] Acute Rehabilitation [phone] (pager)

## 2016-07-08 NOTE — Progress Notes (Signed)
CRITICAL VALUE ALERT  Critical value received:  PT (55.4), INR (6.00)  Date of notification:  07/08/2016  Time of notification:  0937  Critical value read back:Yes.    Nurse who received alert:  Rico Sheehan RN  MD notified (1st page):  Dr Lonny Prude  Time of first page:  0945  MD notified (2nd page):  Time of second page:  Responding MD:  Dr Lonny Prude  Time MD responded:  1000

## 2016-07-08 NOTE — NC FL2 (Signed)
[place] LEVEL OF CARE SCREENING TOOL     IDENTIFICATION  Patient Name: [name] Foley Birthdate: August 30, 1946 Sex: female Admission Date (Current Location): 07/06/2016  [facility] and [place] Number:  Herbalist and Address:  [facility] [place], [place] [address] [place], [place] [address]      Provider Number: [id number]  Attending Physician Name and Address:  Mariel Aloe, MD  Relative Name and Phone Number:       Current Level of Care: Hospital Recommended Level of Care: [place] Prior Approval Number:    Date Approved/Denied:   PASRR Number: [id number]  Discharge Plan: SNF    Current Diagnoses: Patient Active Problem List   Diagnosis Date Noted  . Pneumonia 07/06/2016  . Metabolic acidosis 123456  . Thrombocytopenia ([place]) 07/06/2016  . Sepsis ([place]) 07/06/2016  . UTI (lower urinary tract infection) 07/01/2016  . Leg swelling 07/01/2016  . Liver failure ([place]) 07/01/2016  . AKI (acute kidney injury) ([place]) 07/01/2016  . Abnormal LFTs 07/01/2016  . Abdominal pain 07/01/2016  . Bilateral lower extremity edema   . Hypotension 06/21/2016  . Alcohol abuse 06/14/2016  . Alcoholic liver failure ([place]) 06/14/2016  . Abdominal pain, chronic, epigastric - from chronic pancreatitis 05/26/2014  . Chronic diarrhea - ? steatorrhea vs. IBS or both 04/09/2012  . PANCREATITIS, CHRONIC  08/13/2008  . Hyperlipidemia with target LDL less than 100 01/25/2007  . TOBACCO DEPENDENCE 12/21/2006    Orientation RESPIRATION BLADDER Height & Weight     Self, Time, Place  Normal Continent Weight: 150 lb 9.2 oz (68.3 kg) Height:  5\' 2"  (157.5 cm)  BEHAVIORAL SYMPTOMS/MOOD NEUROLOGICAL BOWEL NUTRITION STATUS      Continent Diet (cardiac)  AMBULATORY STATUS COMMUNICATION OF NEEDS Skin   Extensive Assist Verbally Normal                       Personal Care Assistance Level of Assistance  Bathing, Dressing Bathing  Assistance: Maximum assistance   Dressing Assistance: Maximum assistance     Functional Limitations Info             SPECIAL CARE FACTORS FREQUENCY  PT (By licensed PT), OT (By licensed OT)     PT Frequency: 5/wk OT Frequency: 5/wk            Contractures      Additional Factors Info  Code Status, Allergies Code Status Info: FULL Allergies Info: Diclofenac Sodium, Other           Current Medications (07/08/2016):  This is the current hospital active medication list Current Facility-Administered Medications  Medication Dose Route Frequency Provider Last Rate Last Dose  . ceFEPIme (MAXIPIME) 2 g in dextrose 5 % 50 mL IVPB  2 g Intravenous Q24H Honor Loh, RPH   2 g at 07/07/16 1736  . feeding supplement (ENSURE ENLIVE) (ENSURE ENLIVE) liquid 237 mL  237 mL Oral TID BM Belkys A Regalado, MD   237 mL at 07/07/16 2000  . ferrous gluconate (FERGON) tablet 324 mg  324 mg Oral Q breakfast Belkys A Regalado, MD   324 mg at 07/08/16 0918  . folic acid (FOLVITE) tablet 1 mg  1 mg Oral Daily Belkys A Regalado, MD   1 mg at 07/08/16 0919  . Influenza vac split quadrivalent PF (FLUARIX) injection 0.5 mL  0.5 mL Intramuscular Tomorrow-1000 Belkys A Regalado, MD      . lactulose (CHRONULAC) 10 GM/15ML  solution 30 g  30 g Oral TID Belkys A Regalado, MD   30 g at 07/08/16 0917  . lipase/protease/amylase (CREON) capsule 36,000 Units  36,000 Units Oral TID WC Elmarie Shiley, MD   36,000 Units at 07/08/16 0917  . LORazepam (ATIVAN) injection 2-3 mg  2-3 mg Intravenous Q1H PRN Mariel Aloe, MD      . Magnesium Gluconate TABS 500 mg  500 mg Oral BH-q7a Belkys A Regalado, MD   500 mg at 07/08/16 0917  . mirtazapine (REMERON) tablet 15 mg  15 mg Oral QHS Belkys A Regalado, MD   15 mg at 07/07/16 2149  . multivitamin with minerals tablet 1 tablet  1 tablet Oral Daily Belkys A Regalado, MD   1 tablet at 07/08/16 0919  . nicotine (NICODERM CQ - dosed in mg/24 hours) patch 14 mg  14 mg  Transdermal Q24H Belkys A Regalado, MD   14 mg at 07/07/16 2153  . ondansetron (ZOFRAN) tablet 4 mg  4 mg Oral Q6H PRN Belkys A Regalado, MD       Or  . ondansetron (ZOFRAN) injection 4 mg  4 mg Intravenous Q6H PRN Belkys A Regalado, MD      . pantoprazole (PROTONIX) EC tablet 40 mg  40 mg Oral Daily Belkys A Regalado, MD   40 mg at 07/08/16 0918  . sodium bicarbonate 150 mEq in dextrose 5 % 1,000 mL infusion   Intravenous Continuous Belkys A Regalado, MD 50 mL/hr at 07/07/16 1623    . thiamine (VITAMIN B-1) tablet 100 mg  100 mg Oral Daily Belkys A Regalado, MD   100 mg at 07/08/16 0919  . traMADol (ULTRAM) tablet 25 mg  25 mg Oral Q6H PRN Belkys A Regalado, MD      . vancomycin (VANCOCIN) 500 mg in sodium chloride 0.9 % 100 mL IVPB  500 mg Intravenous Q12H Honor Loh, RPH   500 mg at 07/08/16 [id number]     Discharge Medications: Please see discharge summary for a list of discharge medications.  Relevant Imaging Results:  Relevant Lab Results:   Additional Information SS#: [id number]  Jorge Ny, LCSW

## 2016-07-09 MED ORDER — METOPROLOL TARTRATE 25 MG PO TABS: 25.0000 mg | ORAL_TABLET | Freq: Two times a day (BID) | ORAL | Status: DC

## 2016-07-09 MED ORDER — POTASSIUM CHLORIDE CRYS ER 20 MEQ PO TBCR
40.0000 meq | EXTENDED_RELEASE_TABLET | ORAL | Status: AC
  Administered 2016-07-09 (×2): 40 meq via ORAL
  Filled 2016-07-09 (×2): qty 2

## 2016-07-09 MED ORDER — NICARDIPINE HCL IN NACL 20-0.86 MG/200ML-% IV SOLN
3.0000 mg/h | INTRAVENOUS | Status: DC
  Filled 2016-07-09: qty 200

## 2016-07-09 MED ORDER — LABETALOL HCL 5 MG/ML IV SOLN: 10.0000 mg | INTRAVENOUS | Status: DC | PRN

## 2016-07-09 MED ORDER — HYDRALAZINE HCL 20 MG/ML IJ SOLN
INTRAMUSCULAR | Status: AC
  Administered 2016-07-09: 10 mg via INTRAVENOUS
  Filled 2016-07-09: qty 1

## 2016-07-09 MED ORDER — HYDRALAZINE HCL 20 MG/ML IJ SOLN
10.0000 mg | Freq: Once | INTRAMUSCULAR | Status: AC
  Administered 2016-07-09: 10 mg via INTRAVENOUS

## 2016-07-09 MED ORDER — HYDRALAZINE HCL 25 MG PO TABS
25.0000 mg | ORAL_TABLET | Freq: Four times a day (QID) | ORAL | Status: DC | PRN
  Administered 2016-07-09: 25 mg via ORAL
  Filled 2016-07-09: qty 1

## 2016-07-09 MED ORDER — HYDRALAZINE HCL 25 MG PO TABS: 25.0000 mg | ORAL_TABLET | Freq: Four times a day (QID) | ORAL | Status: DC

## 2016-07-09 NOTE — Progress Notes (Addendum)
[facility]  PROGRESS NOTE  [name] Foley [id number] DOB: 1946/03/03 DOA: 07/06/2016 PCP: Philis Fendt, MD  Brief HPI:   70 y.o. female with a history of end-stage liver disease, alcohol abuse, chronic pancreatitis. She was recently admitted and discharged abdominal pain, diarrhea, hypertension, UTI. She presented this admission with hypotension and a fall    Subjective   This morning, patient found to have elevated BP 202/162. Denies chest pain, shortness of breath. Denies nausea, vomiting .    Assessment/Plan:       1.         Resolving sepsis/HCAP  Patient improving with IV antibiotics  Urine culture showed no growth, blood cultures 2 negative to date  Chest x-ray showed minimal opacity right midlung, may represent patchy area of pneumonia  Day  #4 of IV antibiotics, consider switching to by mouth antibiotics in 1-2 days  2 .        Hypertension, ?error in measurement  Patient found to have elevated blood pressure 202/162  Not improved with IV and by mouth hydralazine  Planned to start IV Cardene drip, later discontinued Cardene drip as hypertension was found error in measurement.  Rapid response was called, blood pressure was in 90s when BP cuff was changed.  3.        Metabolic acidosis, non-anion gap  Likely from diarrhea, CO2 is stable at 19  Follow BMP in a.m.  4.         Decompensated end-stage liver disease  INR 6.0, patient is currently on lactulose 20 g 3 times a day  Not encephalopathic  Ammonia level 88  5.         Hypokalemia  Replace potassium, check BMP in a.m.  6..        Alcohol dependence  No signs and symptoms of alcohol withdrawal  Continue CIWA protocol  7.        Chronic pancreatitis  Right upper quadrant ultrasound showed fatty infiltration versus hepatic cellular disease  Pain has improved.  8          Prolonged QTc  EKG showed QTc 500  Hold seroquel  Continue telemetry monitoring  9.          Coronary artery disease  Hold aspirin, plavix    DVT prophylaxis: SCDs  Code Status: Full code  Family Communication: No family at bedside   Disposition Plan: SNF, when medically stable   Consultants:  None  Procedures:  None  Antibiotics:   Anti-infectives    Start     Dose/Rate Route Frequency Ordered Stop   07/07/16 1600  ceFEPIme (MAXIPIME) 2 g in dextrose 5 % 50 mL IVPB     2 g 100 mL/hr over 30 Minutes Intravenous Every 24 hours 07/06/16 1506     07/07/16 0400  vancomycin (VANCOCIN) 500 mg in sodium chloride 0.9 % 100 mL IVPB     500 mg 100 mL/hr over 60 Minutes Intravenous Every 12 hours 07/06/16 1506     07/06/16 1415  ceFEPIme (MAXIPIME) 2 g in dextrose 5 % 50 mL IVPB     2 g 100 mL/hr over 30 Minutes Intravenous  Once 07/06/16 1413 07/06/16 1609   07/06/16 1415  vancomycin (VANCOCIN) IVPB 1000 mg/200 mL premix     1,000 mg 200 mL/hr over 60 Minutes Intravenous  Once 07/06/16 1413 07/06/16 1726       Objective   Vitals:   07/09/16 1030 07/09/16 1100 07/09/16 1122 07/09/16 1130  BP: Marland Kitchen)  196/161 (!) 202/185 (!) 151/137 (!) 178/166  Pulse: 98 100  95  Resp: (!) 32 (!) 35 (!) 29 (!) 29  Temp:   98.7 F (37.1 C)   TempSrc:   Oral   SpO2: 96% 94%  95%  Weight:      Height:        Intake/Output Summary (Last 24 hours) at 07/09/16 1320 Last data filed at 07/09/16 0312  Gross per 24 hour  Intake              720 ml  Output                0 ml  Net              720 ml   Filed Weights   07/06/16 1129 07/06/16 1900  Weight: 65.8 kg (145 lb) 68.3 kg (150 lb 9.2 oz)     Physical Examination:  General exam: Appears calm and comfortable. Respiratory system: Clear to auscultation. Respiratory effort normal. Cardiovascular system:  RRR. No  murmurs, rubs, gallops. No pedal edema. GI system: Abdomen is nondistended, soft and nontender. No organomegaly.  Skin : no rashes, cyanosis. Psychiatry: Alert, oriented . Affect flat.    Data Reviewed: I  have personally reviewed following labs and imaging studies  CBG:  Recent Labs Lab 07/06/16 1848 07/06/16 2020 07/06/16 2328 07/07/16 2100  GLUCAP 122* 136* 149* 155*    CBC:  Recent Labs Lab 07/03/16 0258 07/04/16 0209 07/07/16 0703 07/08/16 0838  WBC 5.4 7.0 5.2 8.2  HGB 8.2* 9.1* 7.7* 9.0*  HCT 24.1* 26.2* 22.7* 25.4*  MCV 71.7* 71.6* 71.4* 69.6*  PLT 107* 113* 89* 60*    Basic Metabolic Panel:  Recent Labs Lab 07/03/16 0258 07/04/16 0209 07/06/16 1245 07/07/16 0703 07/08/16 0838  NA 143 142 141 144 143  K 4.0 3.7 3.5 3.5 3.2*  CL 117* 119* 115* 119* 117*  CO2 14* 15* 16* 19* 19*  GLUCOSE 79 116* 99 185* 141*  BUN <5* <5* <5* <5* <5*  CREATININE 0.77 0.77 0.98 0.98 1.11*  CALCIUM 7.5* 7.9* 8.4* 7.7* 7.9*  MG 2.1 2.1 1.9  --   --     Recent Results (from the past 240 hour(s))  Urine culture     Status: Abnormal   Collection Time: 06/30/16  8:55 PM  Result Value Ref Range Status   Specimen Description URINE, RANDOM  Final   Special Requests NONE  Final   Culture >=100,000 COLONIES/mL ENTEROCOCCUS FAECIUM (A)  Final   Report Status 07/03/2016 FINAL  Final   Organism ID, Bacteria ENTEROCOCCUS FAECIUM (A)  Final      Susceptibility   Enterococcus faecium - MIC*    AMPICILLIN <=2 SENSITIVE Sensitive     LEVOFLOXACIN 2 SENSITIVE Sensitive     NITROFURANTOIN 64 INTERMEDIATE Intermediate     VANCOMYCIN <=0.5 SENSITIVE Sensitive     * >=100,000 COLONIES/mL ENTEROCOCCUS FAECIUM  Culture, blood (x 2)     Status: None   Collection Time: 07/01/16  4:01 AM  Result Value Ref Range Status   Specimen Description BLOOD RIGHT HAND  Final   Special Requests IN PEDIATRIC BOTTLE 1ML  Final   Culture NO GROWTH 5 DAYS  Final   Report Status 07/06/2016 FINAL  Final  Culture, blood (x 2)     Status: None   Collection Time: 07/01/16  4:11 AM  Result Value Ref Range Status   Specimen Description BLOOD LEFT FOREARM  Final  Special Requests BOTTLES DRAWN AEROBIC AND  ANAEROBIC 5ML  Final   Culture NO GROWTH 5 DAYS  Final   Report Status 07/06/2016 FINAL  Final  MRSA PCR Screening     Status: None   Collection Time: 07/01/16  7:56 AM  Result Value Ref Range Status   MRSA by PCR NEGATIVE NEGATIVE Final    Comment:        The GeneXpert MRSA Assay (FDA approved for NASAL specimens only), is one component of a comprehensive MRSA colonization surveillance program. It is not intended to diagnose MRSA infection nor to guide or monitor treatment for MRSA infections.   Gastrointestinal Panel by PCR , Stool     Status: None   Collection Time: 07/01/16 12:11 PM  Result Value Ref Range Status   Campylobacter species NOT DETECTED NOT DETECTED Final   Plesimonas shigelloides NOT DETECTED NOT DETECTED Final   Salmonella species NOT DETECTED NOT DETECTED Final   Yersinia enterocolitica NOT DETECTED NOT DETECTED Final   Vibrio species NOT DETECTED NOT DETECTED Final   Vibrio cholerae NOT DETECTED NOT DETECTED Final   Enteroaggregative E coli (EAEC) NOT DETECTED NOT DETECTED Final   Enteropathogenic E coli (EPEC) NOT DETECTED NOT DETECTED Final   Enterotoxigenic E coli (ETEC) NOT DETECTED NOT DETECTED Final   Shiga like toxin producing E coli (STEC) NOT DETECTED NOT DETECTED Final   Shigella/Enteroinvasive E coli (EIEC) NOT DETECTED NOT DETECTED Final   Cryptosporidium NOT DETECTED NOT DETECTED Final   Cyclospora cayetanensis NOT DETECTED NOT DETECTED Final   Entamoeba histolytica NOT DETECTED NOT DETECTED Final   Giardia lamblia NOT DETECTED NOT DETECTED Final   Adenovirus F40/41 NOT DETECTED NOT DETECTED Final   Astrovirus NOT DETECTED NOT DETECTED Final   Norovirus GI/GII NOT DETECTED NOT DETECTED Final   Rotavirus A NOT DETECTED NOT DETECTED Final   Sapovirus (I, II, IV, and V) NOT DETECTED NOT DETECTED Final  C difficile quick screen w PCR reflex     Status: None   Collection Time: 07/01/16 12:11 PM  Result Value Ref Range Status   C Diff antigen  NEGATIVE NEGATIVE Final   C Diff toxin NEGATIVE NEGATIVE Final   C Diff interpretation No C. difficile detected.  Final  Gram stain     Status: None   Collection Time: 07/01/16  2:38 PM  Result Value Ref Range Status   Specimen Description PERITONEAL  Final   Special Requests NONE  Final   Gram Stain   Final    WBC PRESENT, PREDOMINANTLY MONONUCLEAR NO ORGANISMS SEEN CYTOSPIN SMEAR    Report Status 07/01/2016 FINAL  Final  Culture, body fluid-bottle     Status: None   Collection Time: 07/01/16  2:38 PM  Result Value Ref Range Status   Specimen Description PERITONEAL  Final   Special Requests NONE  Final   Culture NO GROWTH 5 DAYS  Final   Report Status 07/06/2016 FINAL  Final  Urine culture     Status: None   Collection Time: 07/06/16  2:52 PM  Result Value Ref Range Status   Specimen Description URINE, RANDOM  Final   Special Requests NONE  Final   Culture NO GROWTH  Final   Report Status 07/07/2016 FINAL  Final  MRSA PCR Screening     Status: None   Collection Time: 07/06/16  7:57 PM  Result Value Ref Range Status   MRSA by PCR NEGATIVE NEGATIVE Final    Comment:  The GeneXpert MRSA Assay (FDA approved for NASAL specimens only), is one component of a comprehensive MRSA colonization surveillance program. It is not intended to diagnose MRSA infection nor to guide or monitor treatment for MRSA infections.   Culture, blood (routine x 2)     Status: None (Preliminary result)   Collection Time: 07/07/16  6:35 PM  Result Value Ref Range Status   Specimen Description BLOOD LEFT ARM  Final   Special Requests IN PEDIATRIC BOTTLE 3CC  Final   Culture NO GROWTH 2 DAYS  Final   Report Status PENDING  Incomplete     Liver Function Tests:  Recent Labs Lab 07/03/16 0258 07/04/16 0209 07/06/16 1245 07/07/16 0703 07/08/16 0838  AST 63* 71* 63* 79* 105*  ALT 23 25 30 31  39  ALKPHOS 93 99 91 88 97  BILITOT 5.2* 6.5* 6.4* 5.3* 6.4*  PROT 5.4* 6.3* 5.9* 5.1* 5.7*   ALBUMIN 2.2* 2.5* 2.3* 2.0* 1.9*    Recent Labs Lab 07/06/16 1245  LIPASE 12    Recent Labs Lab 07/06/16 1435  AMMONIA 88*    Cardiac Enzymes:  Recent Labs Lab 07/06/16 1146  CKTOTAL 158   BNP (last 3 results)  Recent Labs  06/21/16 1522 06/30/16 1823  BNP 57.7 46.4    ProBNP (last 3 results) No results for input(s): PROBNP in the last 8760 hours.    Studies: No results found.  Scheduled Meds: . ceFEPime (MAXIPIME) IV  2 g Intravenous Q24H  . feeding supplement (ENSURE ENLIVE)  237 mL Oral TID BM  . ferrous gluconate  324 mg Oral Q breakfast  . folic acid  1 mg Oral Daily  . Influenza vac split quadrivalent PF  0.5 mL Intramuscular Tomorrow-1000  . lactulose  30 g Oral TID  . lipase/protease/amylase  36,000 Units Oral TID WC  . Magnesium Gluconate  500 mg Oral BH-q7a  . mirtazapine  15 mg Oral QHS  . multivitamin with minerals  1 tablet Oral Daily  . nicotine  14 mg Transdermal Q24H  . pantoprazole  40 mg Oral Daily  . thiamine  100 mg Oral Daily  . vancomycin  500 mg Intravenous Q12H    Continuous Infusions:   Time spent: 35 min  [facility] Pager [phone]. If 7PM-7AM, please contact night-coverage at [email or web address], Office  [phone]  password [email or web address] 07/09/2016, 1:20 PM  LOS: 3 days

## 2016-07-09 NOTE — Progress Notes (Signed)
Paged Dr. Darrick Meigs about pts bp 202/185 post 10mg  IV hydralazine and 25mg  PO hydralazine.  New orders received.

## 2016-07-09 NOTE — Progress Notes (Signed)
Pharmacy Antibiotic Note  [name] Foley is a 70 y.o. female admitted on 07/06/2016 with mechanical fall and abdominal pain.  Pharmacy has been consulted for vancomycin and cefepime dosing.   Patient is afebrile and wbc now wnl. CXR has not yet been repeated since admisison. This is day #4 of antibiotics. MD considering oral therapy in 1-2 days.   Plan: Continue Vancomycin 500 mg q12hr. Goal trough 15-20 mcg/mL  Continue Cefepime 2g q24hr  Monitor renal function, clinical status, culture results, and vancomycin trough as needed Please consider discontinuing vancomycin.   Height: 5\' 2"  (157.5 cm) Weight: 150 lb 9.2 oz (68.3 kg) IBW/kg (Calculated) : 50.1  Temp (24hrs), Avg:98.6 F (37 C), Min:96.6 F (35.9 C), Max:99.5 F (37.5 C)   Recent Labs Lab 07/03/16 0258 07/04/16 0209 07/06/16 1245 07/06/16 1343 07/06/16 1504 07/07/16 0703 07/08/16 0838  WBC 5.4 7.0  --   --   --  5.2 8.2  CREATININE 0.77 0.77 0.98  --   --  0.98 1.11*  LATICACIDVEN  --   --   --  1.94* 1.06  --   --     Estimated Creatinine Clearance: 44 mL/min (by C-G formula based on SCr of 1.11 mg/dL (H)).    Allergies  Allergen Reactions  . Diclofenac Sodium Other (See Comments)    REACTION: Chronic renal insufficiency. Patient can use the gel   . Other Other (See Comments)    Blackeyed peas and Pinto Beans...break out in hives    Antimicrobials this admission: 9/13 Cefepime >>  9/13 Vanc  >>   Dose adjustments this admission: N/A  Microbiology results:  9/13 MRSA PCR - negative 9/13 UCx - negative 9/14 BCx x2 - 9/14 Sputum - cancelled  Thank you for allowing pharmacy to be a part of this patient's care.  Sloan Leiter, PharmD, BCPS Clinical Pharmacist [phone] 07/09/16 2:25 PM

## 2016-07-10 LAB — CBC
HCT: 27.7 % — ABNORMAL LOW (ref 36.0–46.0)
Hemoglobin: 9.4 g/dL — ABNORMAL LOW (ref 12.0–15.0)
MCH: 24.2 pg — AB (ref 26.0–34.0)
MCHC: 33.9 g/dL (ref 30.0–36.0)
MCV: 71.4 fL — AB (ref 78.0–100.0)
PLATELETS: 104 10*3/uL — AB (ref 150–400)
RBC: 3.88 MIL/uL (ref 3.87–5.11)
RDW: 22.8 % — AB (ref 11.5–15.5)
WBC: 13.4 10*3/uL — AB (ref 4.0–10.5)

## 2016-07-10 LAB — COMPREHENSIVE METABOLIC PANEL
ALK PHOS: 111 U/L (ref 38–126)
ALT: 54 U/L (ref 14–54)
AST: 126 U/L — AB (ref 15–41)
Albumin: 2.2 g/dL — ABNORMAL LOW (ref 3.5–5.0)
Anion gap: 10 (ref 5–15)
BUN: 8 mg/dL (ref 6–20)
CALCIUM: 8.8 mg/dL — AB (ref 8.9–10.3)
CHLORIDE: 117 mmol/L — AB (ref 101–111)
CO2: 21 mmol/L — AB (ref 22–32)
CREATININE: 1.87 mg/dL — AB (ref 0.44–1.00)
GFR calc Af Amer: 31 mL/min — ABNORMAL LOW (ref >60)
GFR, EST NON AFRICAN AMERICAN: 27 mL/min — AB (ref >60)
Glucose, Bld: 130 mg/dL — ABNORMAL HIGH (ref 65–99)
Potassium: 4.4 mmol/L (ref 3.5–5.1)
Sodium: 148 mmol/L — ABNORMAL HIGH (ref 135–145)
Total Bilirubin: 10.6 mg/dL — ABNORMAL HIGH (ref 0.3–1.2)
Total Protein: 6.7 g/dL (ref 6.5–8.1)

## 2016-07-10 MED ORDER — MAGNESIUM GLUCONATE 500 (27 MG) MG PO TABS
500.0000 mg | ORAL_TABLET | Freq: Every day | ORAL | Status: DC
  Filled 2016-07-10: qty 1

## 2016-07-10 MED ORDER — PANCRELIPASE (LIP-PROT-AMYL) 12000-38000 UNITS PO CPEP
36000.0000 [IU] | ORAL_CAPSULE | Freq: Three times a day (TID) | ORAL | Status: DC
  Administered 2016-07-10 – 2016-07-11 (×4): 36000 [IU] via ORAL
  Filled 2016-07-10 (×5): qty 3

## 2016-07-10 MED ORDER — VANCOMYCIN HCL IN DEXTROSE 750-5 MG/150ML-% IV SOLN: 750.0000 mg | INTRAVENOUS | Status: DC

## 2016-07-10 MED ORDER — MAGNESIUM OXIDE 400 (241.3 MG) MG PO TABS
400.0000 mg | ORAL_TABLET | Freq: Every day | ORAL | Status: DC
  Administered 2016-07-10 – 2016-07-11 (×2): 400 mg via ORAL
  Filled 2016-07-10 (×2): qty 1

## 2016-07-10 MED ORDER — DEXTROSE 5 % IV SOLN
1.0000 g | INTRAVENOUS | Status: DC
  Administered 2016-07-10 – 2016-07-11 (×2): 1 g via INTRAVENOUS
  Filled 2016-07-10 (×3): qty 1

## 2016-07-10 MED ORDER — VANCOMYCIN HCL 500 MG IV SOLR: 500.0000 mg | INTRAVENOUS | Status: DC

## 2016-07-10 MED ORDER — VANCOMYCIN HCL 500 MG IV SOLR
500.0000 mg | INTRAVENOUS | Status: DC
  Administered 2016-07-11: 500 mg via INTRAVENOUS
  Filled 2016-07-10 (×2): qty 500

## 2016-07-10 MED ORDER — FERROUS GLUCONATE 324 (38 FE) MG PO TABS
324.0000 mg | ORAL_TABLET | Freq: Every day | ORAL | Status: DC
  Administered 2016-07-10 – 2016-07-11 (×2): 324 mg via ORAL
  Filled 2016-07-10 (×3): qty 1

## 2016-07-10 NOTE — Progress Notes (Signed)
Pharmacy Antibiotic Note  [name] Foley is a 70 y.o. female admitted on 07/06/2016 with mechanical fall and abdominal pain.  Pharmacy has been consulted for vancomycin and cefepime dosing.   SCr almost doubled in last 24 hours and RN notes that patient is incontinent making very little urine this AM. WBC is up this AM at 13.4. Cutlures remain negative. Patient is afebrile.   Plan: Decrease Vancomycin to 500 mg IV every 24 hours MD- Please consider if vancomycin is still needed.  Reduce Cefepime to 1g IV every 24 hours.  Monitor renal function, clinical status, culture results, and vancomycin trough as needed  Height: 5\' 2"  (157.5 cm) Weight: 150 lb 9.2 oz (68.3 kg) IBW/kg (Calculated) : 50.1  Temp (24hrs), Avg:98.2 F (36.8 C), Min:96.6 F (35.9 C), Max:98.7 F (37.1 C)   Recent Labs Lab 07/04/16 0209 07/06/16 1245 07/06/16 1343 07/06/16 1504 07/07/16 0703 07/08/16 0838 07/10/16 0549  WBC 7.0  --   --   --  5.2 8.2 13.4*  CREATININE 0.77 0.98  --   --  0.98 1.11* 1.87*  LATICACIDVEN  --   --  1.94* 1.06  --   --   --     Estimated Creatinine Clearance: 26.1 mL/min (by C-G formula based on SCr of 1.87 mg/dL (H)).    Allergies  Allergen Reactions  . Diclofenac Sodium Other (See Comments)    REACTION: Chronic renal insufficiency. Patient can use the gel   . Other Other (See Comments)    Blackeyed peas and Pinto Beans...break out in hives    Antimicrobials this admission: 9/13 Cefepime >>  9/13 Vanc  >>   Dose adjustments this admission:  9/17- Decreased vancomycin dose by half due to rising SCr and decreased UOP  Microbiology results:  9/13 MRSA PCR - negative 9/13 UCx - negative 9/14 BCx x2 - 9/14 Sputum - cancelled  Thank you for allowing pharmacy to be a part of this patient's care.  Sloan Leiter, PharmD, BCPS Clinical Pharmacist [phone] 07/10/16 8:12 AM

## 2016-07-10 NOTE — Plan of Care (Signed)
Problem: Education: Goal: Knowledge of [facility] Education information/materials will improve Outcome: Progressing Discussed with patient the importance of taking her laxative medication with some teach back displayed

## 2016-07-10 NOTE — Progress Notes (Signed)
[facility]  PROGRESS NOTE  [place] [id number] DOB: [birth year] DOA: 07/06/2016 PCP: Philis Fendt, MD  Brief HPI:   70 y.o. female with a history of end-stage liver disease, alcohol abuse, chronic pancreatitis. She was recently admitted and discharged abdominal pain, diarrhea, hypertension, UTI. She presented this admission with hypotension and a fall. On 9/17 pt renal parameters worsened to 1.8 .     Subjective   Denies chest pain or sob.     Assessment/Plan:       1.         Resolving sepsis/HCAP  Patient improving with IV antibiotics  Urine culture showed no growth, blood cultures 2 negative to date  Chest x-ray showed minimal opacity right midlung, may represent patchy area of pneumonia  Completed 5 days of IV antibiotics , please switch to oral antibiotics in am.   2 .        Hypertension, resolved.   3.        Metabolic acidosis, non-anion gap  Improved.   4.         Decompensated end-stage liver disease  Worsening jaundice and coagulopathic. Worsening abdominal distention. Get US abdomen and plan for paracentesis in am if ascites is present.   5.         Hypokalemia  Replace potassium, check BMP in a.m.  6..        Alcohol dependence  No signs and symptoms of alcohol withdrawal  Continue CIWA protocol  7.        Chronic pancreatitis  Right upper quadrant ultrasound showed fatty infiltration versus hepatic cellular disease  Pain has improved.  8          Prolonged QTc  EKG showed QTc 500  Hold seroquel  Continue telemetry monitoring  9.         Coronary artery disease  Hold aspirin, plavix  10. Acute renal failure: unclear etiology. Urine output is not measured.  Get US RENAL.      DVT prophylaxis: SCDs  Code Status: Full code  Family Communication: No family at bedside   Disposition Plan: SNF, when medically stable   Consultants:  None  Procedures:  None  Antibiotics:   Anti-infectives    Start      Dose/Rate Route Frequency Ordered Stop   07/11/16 1200  vancomycin (VANCOCIN) 500 mg in sodium chloride 0.9 % 100 mL IVPB     500 mg 100 mL/hr over 60 Minutes Intravenous Every 24 hours 07/10/16 0816     07/11/16 0322  vancomycin (VANCOCIN) IVPB 750 mg/150 ml premix  Status:  Discontinued     750 mg 150 mL/hr over 60 Minutes Intravenous Every 24 hours 07/10/16 0812 07/10/16 0813   07/11/16 0322  vancomycin (VANCOCIN) 500 mg in sodium chloride 0.9 % 100 mL IVPB  Status:  Discontinued     500 mg 100 mL/hr over 60 Minutes Intravenous Every 24 hours 07/10/16 0813 07/10/16 0816   07/10/16 1600  ceFEPIme (MAXIPIME) 1 g in dextrose 5 % 50 mL IVPB     1 g 100 mL/hr over 30 Minutes Intravenous Every 24 hours 07/10/16 0812     07/07/16 1600  ceFEPIme (MAXIPIME) 2 g in dextrose 5 % 50 mL IVPB  Status:  Discontinued     2 g 100 mL/hr over 30 Minutes Intravenous Every 24 hours 07/06/16 1506 07/10/16 0812   07/07/16 0400  vancomycin (VANCOCIN) 500 mg in sodium chloride 0.9 % 100 mL IVPB  Status:  Discontinued     500 mg 100 mL/hr over 60 Minutes Intravenous Every 12 hours 07/06/16 1506 07/10/16 0812   07/06/16 1415  ceFEPIme (MAXIPIME) 2 g in dextrose 5 % 50 mL IVPB     2 g 100 mL/hr over 30 Minutes Intravenous  Once 07/06/16 1413 07/06/16 1609   07/06/16 1415  vancomycin (VANCOCIN) IVPB 1000 mg/200 mL premix     1,000 mg 200 mL/hr over 60 Minutes Intravenous  Once 07/06/16 1413 07/06/16 1726       Objective   Vitals:   07/10/16 1148 07/10/16 1200 07/10/16 1600 07/10/16 1800  BP:  115/82 99/77   Pulse:  91    Resp:  (!) 27    Temp:  97.5 F (36.4 C) 98.4 F (36.9 C)   TempSrc:  Oral Oral   SpO2: 93% 98%  98%  Weight:      Height:        Intake/Output Summary (Last 24 hours) at 07/10/16 1852 Last data filed at 07/10/16 1800  Gross per 24 hour  Intake              640 ml  Output                0 ml  Net              640 ml   Filed Weights   07/06/16 1129 07/06/16 1900   Weight: 65.8 kg (145 lb) 68.3 kg (150 lb 9.2 oz)     Physical Examination:  General exam: Appears calm and comfortable. Respiratory system: Clear to auscultation. Respiratory effort normal. Cardiovascular system:  RRR. No  murmurs, rubs, gallops. No pedal edema. GI system: Abdomen is nondistended, soft and nontender. No organomegaly.  Skin : no rashes, cyanosis. Psychiatry: Alert, oriented . Affect flat.    Data Reviewed: I have personally reviewed following labs and imaging studies  CBG:  Recent Labs Lab 07/06/16 1848 07/06/16 2020 07/06/16 2328 07/07/16 2100  GLUCAP 122* 136* 149* 155*    CBC:  Recent Labs Lab 07/04/16 0209 07/07/16 0703 07/08/16 0838 07/10/16 0549  WBC 7.0 5.2 8.2 13.4*  HGB 9.1* 7.7* 9.0* 9.4*  HCT 26.2* 22.7* 25.4* 27.7*  MCV 71.6* 71.4* 69.6* 71.4*  PLT 113* 89* 60* 104*    Basic Metabolic Panel:  Recent Labs Lab 07/04/16 0209 07/06/16 1245 07/07/16 0703 07/08/16 0838 07/10/16 0549  NA 142 141 144 143 148*  K 3.7 3.5 3.5 3.2* 4.4  CL 119* 115* 119* 117* 117*  CO2 15* 16* 19* 19* 21*  GLUCOSE 116* 99 185* 141* 130*  BUN <5* <5* <5* <5* 8  CREATININE 0.77 0.98 0.98 1.11* 1.87*  CALCIUM 7.9* 8.4* 7.7* 7.9* 8.8*  MG 2.1 1.9  --   --   --     Recent Results (from the past 240 hour(s))  Urine culture     Status: Abnormal   Collection Time: 06/30/16  8:55 PM  Result Value Ref Range Status   Specimen Description URINE, RANDOM  Final   Special Requests NONE  Final   Culture >=100,000 COLONIES/mL ENTEROCOCCUS FAECIUM (A)  Final   Report Status 07/03/2016 FINAL  Final   Organism ID, Bacteria ENTEROCOCCUS FAECIUM (A)  Final      Susceptibility   Enterococcus faecium - MIC*    AMPICILLIN <=2 SENSITIVE Sensitive     LEVOFLOXACIN 2 SENSITIVE Sensitive     NITROFURANTOIN 64 INTERMEDIATE Intermediate     VANCOMYCIN <=0.5 SENSITIVE Sensitive     * >=  100,000 COLONIES/mL ENTEROCOCCUS FAECIUM  Culture, blood (x 2)     Status: None    Collection Time: 07/01/16  4:01 AM  Result Value Ref Range Status   Specimen Description BLOOD RIGHT HAND  Final   Special Requests IN PEDIATRIC BOTTLE 1ML  Final   Culture NO GROWTH 5 DAYS  Final   Report Status 07/06/2016 FINAL  Final  Culture, blood (x 2)     Status: None   Collection Time: 07/01/16  4:11 AM  Result Value Ref Range Status   Specimen Description BLOOD LEFT FOREARM  Final   Special Requests BOTTLES DRAWN AEROBIC AND ANAEROBIC 5ML  Final   Culture NO GROWTH 5 DAYS  Final   Report Status 07/06/2016 FINAL  Final  MRSA PCR Screening     Status: None   Collection Time: 07/01/16  7:56 AM  Result Value Ref Range Status   MRSA by PCR NEGATIVE NEGATIVE Final    Comment:        The GeneXpert MRSA Assay (FDA approved for NASAL specimens only), is one component of a comprehensive MRSA colonization surveillance program. It is not intended to diagnose MRSA infection nor to guide or monitor treatment for MRSA infections.   Gastrointestinal Panel by PCR , Stool     Status: None   Collection Time: 07/01/16 12:11 PM  Result Value Ref Range Status   Campylobacter species NOT DETECTED NOT DETECTED Final   Plesimonas shigelloides NOT DETECTED NOT DETECTED Final   Salmonella species NOT DETECTED NOT DETECTED Final   Yersinia enterocolitica NOT DETECTED NOT DETECTED Final   Vibrio species NOT DETECTED NOT DETECTED Final   Vibrio cholerae NOT DETECTED NOT DETECTED Final   Enteroaggregative E coli (EAEC) NOT DETECTED NOT DETECTED Final   Enteropathogenic E coli (EPEC) NOT DETECTED NOT DETECTED Final   Enterotoxigenic E coli (ETEC) NOT DETECTED NOT DETECTED Final   Shiga like toxin producing E coli (STEC) NOT DETECTED NOT DETECTED Final   Shigella/Enteroinvasive E coli (EIEC) NOT DETECTED NOT DETECTED Final   Cryptosporidium NOT DETECTED NOT DETECTED Final   Cyclospora cayetanensis NOT DETECTED NOT DETECTED Final   Entamoeba histolytica NOT DETECTED NOT DETECTED Final   Giardia  lamblia NOT DETECTED NOT DETECTED Final   Adenovirus F40/41 NOT DETECTED NOT DETECTED Final   Astrovirus NOT DETECTED NOT DETECTED Final   Norovirus GI/GII NOT DETECTED NOT DETECTED Final   Rotavirus A NOT DETECTED NOT DETECTED Final   Sapovirus (I, II, IV, and V) NOT DETECTED NOT DETECTED Final  C difficile quick screen w PCR reflex     Status: None   Collection Time: 07/01/16 12:11 PM  Result Value Ref Range Status   C Diff antigen NEGATIVE NEGATIVE Final   C Diff toxin NEGATIVE NEGATIVE Final   C Diff interpretation No C. difficile detected.  Final  Gram stain     Status: None   Collection Time: 07/01/16  2:38 PM  Result Value Ref Range Status   Specimen Description PERITONEAL  Final   Special Requests NONE  Final   Gram Stain   Final    WBC PRESENT, PREDOMINANTLY MONONUCLEAR NO ORGANISMS SEEN CYTOSPIN SMEAR    Report Status 07/01/2016 FINAL  Final  Culture, body fluid-bottle     Status: None   Collection Time: 07/01/16  2:38 PM  Result Value Ref Range Status   Specimen Description PERITONEAL  Final   Special Requests NONE  Final   Culture NO GROWTH 5 DAYS  Final  Report Status 07/06/2016 FINAL  Final  Urine culture     Status: None   Collection Time: 07/06/16  2:52 PM  Result Value Ref Range Status   Specimen Description URINE, RANDOM  Final   Special Requests NONE  Final   Culture NO GROWTH  Final   Report Status 07/07/2016 FINAL  Final  MRSA PCR Screening     Status: None   Collection Time: 07/06/16  7:57 PM  Result Value Ref Range Status   MRSA by PCR NEGATIVE NEGATIVE Final    Comment:        The GeneXpert MRSA Assay (FDA approved for NASAL specimens only), is one component of a comprehensive MRSA colonization surveillance program. It is not intended to diagnose MRSA infection nor to guide or monitor treatment for MRSA infections.   Culture, blood (routine x 2)     Status: None (Preliminary result)   Collection Time: 07/07/16  6:35 PM  Result Value Ref  Range Status   Specimen Description BLOOD LEFT ARM  Final   Special Requests IN PEDIATRIC BOTTLE 3CC  Final   Culture NO GROWTH 3 DAYS  Final   Report Status PENDING  Incomplete     Liver Function Tests:  Recent Labs Lab 07/04/16 0209 07/06/16 1245 07/07/16 0703 07/08/16 0838 07/10/16 0549  AST 71* 63* 79* 105* 126*  ALT 25 30 31  39 54  ALKPHOS 99 91 88 97 111  BILITOT 6.5* 6.4* 5.3* 6.4* 10.6*  PROT 6.3* 5.9* 5.1* 5.7* 6.7  ALBUMIN 2.5* 2.3* 2.0* 1.9* 2.2*    Recent Labs Lab 07/06/16 1245  LIPASE 12    Recent Labs Lab 07/06/16 1435  AMMONIA 88*    Cardiac Enzymes:  Recent Labs Lab 07/06/16 1146  CKTOTAL 158   BNP (last 3 results)  Recent Labs  06/21/16 1522 06/30/16 1823  BNP 57.7 46.4    ProBNP (last 3 results) No results for input(s): PROBNP in the last 8760 hours.    Studies: No results found.  Scheduled Meds: . ceFEPime (MAXIPIME) IV  1 g Intravenous Q24H  . feeding supplement (ENSURE ENLIVE)  237 mL Oral TID BM  . ferrous gluconate  324 mg Oral Q breakfast  . folic acid  1 mg Oral Daily  . Influenza vac split quadrivalent PF  0.5 mL Intramuscular Tomorrow-1000  . lactulose  30 g Oral TID  . lipase/protease/amylase  36,000 Units Oral TID WC  . magnesium oxide  400 mg Oral Daily  . mirtazapine  15 mg Oral QHS  . multivitamin with minerals  1 tablet Oral Daily  . nicotine  14 mg Transdermal Q24H  . pantoprazole  40 mg Oral Daily  . thiamine  100 mg Oral Daily  . [START ON 07/11/2016] vancomycin  500 mg Intravenous Q24H    Continuous Infusions:   Time spent: 35 min  [facility] Pager [phone]. If 7PM-7AM, please contact night-coverage at [email or web address], Office  [phone]  password [email or web address] 07/10/2016, 6:52 PM  LOS: 4 days

## 2016-07-10 NOTE — Plan of Care (Signed)
Problem: Education: Goal: Knowledge of [facility] Education information/materials will improve Outcome: Progressing Discussed with patient importance of staying in the bed for safety with some teach back displayed

## 2016-07-10 NOTE — Progress Notes (Signed)
Patient is incontinent and giving very little urine which smells concentrated.

## 2016-07-11 ENCOUNTER — Inpatient Hospital Stay (HOSPITAL_COMMUNITY): Payer: Medicare Other

## 2016-07-11 DIAGNOSIS — N179 Acute kidney failure, unspecified: Secondary | ICD-10-CM

## 2016-07-11 DIAGNOSIS — R791 Abnormal coagulation profile: Secondary | ICD-10-CM

## 2016-07-11 DIAGNOSIS — K7290 Hepatic failure, unspecified without coma: Secondary | ICD-10-CM

## 2016-07-11 DIAGNOSIS — K7040 Alcoholic hepatic failure without coma: Secondary | ICD-10-CM

## 2016-07-11 DIAGNOSIS — K860 Alcohol-induced chronic pancreatitis: Secondary | ICD-10-CM

## 2016-07-11 DIAGNOSIS — N170 Acute kidney failure with tubular necrosis: Secondary | ICD-10-CM

## 2016-07-11 MED ORDER — SODIUM CHLORIDE 0.45 % IV SOLN
INTRAVENOUS | Status: DC
  Administered 2016-07-11 – 2016-07-12 (×2): via INTRAVENOUS

## 2016-07-11 MED ORDER — PREDNISOLONE 5 MG PO TABS
40.0000 mg | ORAL_TABLET | Freq: Every day | ORAL | Status: DC
  Filled 2016-07-11 (×2): qty 8

## 2016-07-11 NOTE — Progress Notes (Signed)
Palliative Medicine consult noted. Due to high referral volume, there may be a delay seeing this patient. Please call the Palliative Medicine Team office at [phone] if recommendations are needed in the interim.  Thank you for inviting [place] to see this patient.  Marjie Skiff Nicoletta Hush, RN, BSN, [facility] 07/11/2016 1:58 PM Cell [phone] 8:00-4:00 Monday-Friday Office [phone]

## 2016-07-11 NOTE — Progress Notes (Addendum)
[facility]  PROGRESS NOTE  [name] Foley [id number] DOB: Apr 10, 1946 DOA: 07/06/2016 PCP: Philis Fendt, MD  Brief HPI:   70 y.o. female with a history of end-stage liver disease, alcohol abuse, chronic pancreatitis. She was recently admitted and discharged abdominal pain, diarrhea, hypertension, UTI. She presented this admission with hypotension and a fall. On 9/17 pt renal parameters worsened to 1.8 .     Subjective   Very confused ,gear hugger in place , not answering questions     Assessment/Plan:       1.         Resolving sepsis/HCAP  Patient improving with IV antibiotics  Urine culture showed no growth, blood cultures 2 negative to date  Chest x-ray showed minimal opacity right midlung, may represent patchy area of pneumonia  Consider DC abx if she transitions to palliative care    2 .        Hypertension, resolved.   3.        Metabolic acidosis, non-anion gap/hypothermia   Due to   on going sepsis , AKI , diarrhea  4.         Decompensated end-stage liver disease Worsening jaundice and coagulopathic. Worsening abdominal distention. Last USG 9/13 did not show much ascites GI consult for prognosis due to recurrent hospitalizations,chronic diarrhea,  Determine prognosis   5.         Hypokalemia  Replaced potassium, check BMP in a.m.  6..        Alcohol dependence  Acute encephalopathy, multifactorial due to HE , AKI, worsening live fun  Continue CIWA protocol  7.        Chronic pancreatitis  Right upper quadrant ultrasound showed fatty infiltration versus hepatic cellular disease  Pain has improved.  8          Prolonged QTc  EKG showed QTc 500  Hold seroquel  Continue telemetry monitoring  9.         Coronary artery disease  Hold aspirin, plavix given coagulopathy  10. Acute renal failure: hepatorenal syndrome? Vs prerenal getting worse  , start 0.45 NS     DVT prophylaxis: SCDs  Code Status: DNR  Family  Communication: called Magda Paganini Niece , and requested to come in tomorrow with HPOA for bedside discussion about prognosis which is very grim at this point , discussed with niece Milahn, [place] [phone], Told her that patient is actively dying ,prognosis is days at best, She agrees to make her a DNR   Disposition Plan: [facility]    Consultants:  None  Procedures:  None  Antibiotics:   Anti-infectives    Start     Dose/Rate Route Frequency Ordered Stop   07/11/16 1200  vancomycin (VANCOCIN) 500 mg in sodium chloride 0.9 % 100 mL IVPB     500 mg 100 mL/hr over 60 Minutes Intravenous Every 24 hours 07/10/16 0816     07/11/16 0322  vancomycin (VANCOCIN) IVPB 750 mg/150 ml premix  Status:  Discontinued     750 mg 150 mL/hr over 60 Minutes Intravenous Every 24 hours 07/10/16 0812 07/10/16 0813   07/11/16 0322  vancomycin (VANCOCIN) 500 mg in sodium chloride 0.9 % 100 mL IVPB  Status:  Discontinued     500 mg 100 mL/hr over 60 Minutes Intravenous Every 24 hours 07/10/16 0813 07/10/16 0816   07/10/16 1600  ceFEPIme (MAXIPIME) 1 g in dextrose 5 % 50 mL IVPB     1 g 100 mL/hr over  30 Minutes Intravenous Every 24 hours 07/10/16 0812     07/07/16 1600  ceFEPIme (MAXIPIME) 2 g in dextrose 5 % 50 mL IVPB  Status:  Discontinued     2 g 100 mL/hr over 30 Minutes Intravenous Every 24 hours 07/06/16 1506 07/10/16 0812   07/07/16 0400  vancomycin (VANCOCIN) 500 mg in sodium chloride 0.9 % 100 mL IVPB  Status:  Discontinued     500 mg 100 mL/hr over 60 Minutes Intravenous Every 12 hours 07/06/16 1506 07/10/16 0812   07/06/16 1415  ceFEPIme (MAXIPIME) 2 g in dextrose 5 % 50 mL IVPB     2 g 100 mL/hr over 30 Minutes Intravenous  Once 07/06/16 1413 07/06/16 1609   07/06/16 1415  vancomycin (VANCOCIN) IVPB 1000 mg/200 mL premix     1,000 mg 200 mL/hr over 60 Minutes Intravenous  Once 07/06/16 1413 07/06/16 1726       Objective   Vitals:   07/11/16 0500 07/11/16 0600 07/11/16  0710 07/11/16 1130  BP:  125/90 114/84 (!) 159/143  Pulse: 85 78 78 77  Resp: (!) 25 (!) 28 (!) 30 (!) 39  Temp:   98.9 F (37.2 C) (!) 91.2 F (32.9 C)  TempSrc:   Oral Rectal  SpO2: 92% 93% 94% 94%  Weight:      Height:        Intake/Output Summary (Last 24 hours) at 07/11/16 1537 Last data filed at 07/11/16 1400  Gross per 24 hour  Intake              810 ml  Output                0 ml  Net              810 ml   Filed Weights   07/06/16 1129 07/06/16 1900  Weight: 65.8 kg (145 lb) 68.3 kg (150 lb 9.2 oz)     Physical Examination:  General exam: Appears calm and comfortable. Respiratory system: Clear to auscultation. Respiratory effort normal. Cardiovascular system:  RRR. No  murmurs, rubs, gallops. No pedal edema. GI system: Abdomen is nondistended, soft and nontender. No organomegaly.  Skin : no rashes, cyanosis. Psychiatry: Alert, oriented . Affect flat.    Data Reviewed: I have personally reviewed following labs and imaging studies  CBG:  Recent Labs Lab 07/06/16 1848 07/06/16 2020 07/06/16 2328 07/07/16 2100  GLUCAP 122* 136* 149* 155*    CBC:  Recent Labs Lab 07/07/16 0703 07/08/16 0838 07/10/16 0549  WBC 5.2 8.2 13.4*  HGB 7.7* 9.0* 9.4*  HCT 22.7* 25.4* 27.7*  MCV 71.4* 69.6* 71.4*  PLT 89* 60* 104*    Basic Metabolic Panel:  Recent Labs Lab 07/06/16 1245 07/07/16 0703 07/08/16 0838 07/10/16 0549  NA 141 144 143 148*  K 3.5 3.5 3.2* 4.4  CL 115* 119* 117* 117*  CO2 16* 19* 19* 21*  GLUCOSE 99 185* 141* 130*  BUN <5* <5* <5* 8  CREATININE 0.98 0.98 1.11* 1.87*  CALCIUM 8.4* 7.7* 7.9* 8.8*  MG 1.9  --   --   --     Recent Results (from the past 240 hour(s))  Urine culture     Status: None   Collection Time: 07/06/16  2:52 PM  Result Value Ref Range Status   Specimen Description URINE, RANDOM  Final   Special Requests NONE  Final   Culture NO GROWTH  Final   Report Status 07/07/2016 FINAL  Final  MRSA PCR Screening      Status: None   Collection Time: 07/06/16  7:57 PM  Result Value Ref Range Status   MRSA by PCR NEGATIVE NEGATIVE Final    Comment:        The GeneXpert MRSA Assay (FDA approved for NASAL specimens only), is one component of a comprehensive MRSA colonization surveillance program. It is not intended to diagnose MRSA infection nor to guide or monitor treatment for MRSA infections.   Culture, blood (routine x 2)     Status: None (Preliminary result)   Collection Time: 07/07/16  6:35 PM  Result Value Ref Range Status   Specimen Description BLOOD LEFT ARM  Final   Special Requests IN PEDIATRIC BOTTLE 3CC  Final   Culture NO GROWTH 3 DAYS  Final   Report Status PENDING  Incomplete     Liver Function Tests:  Recent Labs Lab 07/06/16 1245 07/07/16 0703 07/08/16 0838 07/10/16 0549  AST 63* 79* 105* 126*  ALT 30 31 39 54  ALKPHOS 91 88 97 111  BILITOT 6.4* 5.3* 6.4* 10.6*  PROT 5.9* 5.1* 5.7* 6.7  ALBUMIN 2.3* 2.0* 1.9* 2.2*    Recent Labs Lab 07/06/16 1245  LIPASE 12    Recent Labs Lab 07/06/16 1435  AMMONIA 88*    Cardiac Enzymes:  Recent Labs Lab 07/06/16 1146  CKTOTAL 158   BNP (last 3 results)  Recent Labs  06/21/16 1522 06/30/16 1823  BNP 57.7 46.4    ProBNP (last 3 results) No results for input(s): PROBNP in the last 8760 hours.    Studies: No results found.  Scheduled Meds: . ceFEPime (MAXIPIME) IV  1 g Intravenous Q24H  . feeding supplement (ENSURE ENLIVE)  237 mL Oral TID BM  . ferrous gluconate  324 mg Oral Q breakfast  . folic acid  1 mg Oral Daily  . Influenza vac split quadrivalent PF  0.5 mL Intramuscular Tomorrow-1000  . lactulose  30 g Oral TID  . lipase/protease/amylase  36,000 Units Oral TID WC  . magnesium oxide  400 mg Oral Daily  . mirtazapine  15 mg Oral QHS  . multivitamin with minerals  1 tablet Oral Daily  . nicotine  14 mg Transdermal Q24H  . pantoprazole  40 mg Oral Daily  . prednisoLONE  40 mg Oral Daily  .  thiamine  100 mg Oral Daily  . vancomycin  500 mg Intravenous Q24H    Continuous Infusions:   Time spent: 35 min  [facility] Pager [phone]. If 7PM-7AM, please contact night-coverage at [email or web address], Office  [phone]  password [email or web address] 07/11/2016, 3:37 PM  LOS: 5 days

## 2016-07-11 NOTE — Care Management Important Message (Signed)
Important Message  Patient Details  Name: [name] Foley MRN: [id number] Date of Birth: 01/19/1946   Medicare Important Message Given:  Yes    Nathen May 07/11/2016, 9:41 AM

## 2016-07-11 NOTE — Consult Note (Signed)
[facility] Consult: 1:47 PM 07/11/2016  LOS: 5 days    Referring Provider: Dr Allyson Sabal  Primary Care Physician:  Philis Fendt, MD Primary Gastroenterologist:  Dr. Carlean Purl    Reason for Consultation:  Hospitalist requests GI to determine prognosis in pt with alcoholic liver disease.    HPI: [name] Foley is a 70 y.o. female.  Hx CAD, Non-STEMI and cardiac stents in 2007 and 2013.  PAD, occluded right ext iliac.  Chronic Plavix.  Chronic pancreatitis with associated chronic abdominal pain and ongoing etoh abuse, on Pancreatic enzyme supplements.  ETOH hepatitis, fatty liver but no clear diagnosis of cirrhosis.  Coagulopathy.  Protein malnutrition, ~ 08/2015 PMD added 5 mg Prednisone daily to boost her appetite.   Last GI office visit 08/2015. Dr Carlean Purl renewed oxy IR for chronic abdominal pain, told her to stop Meloxicam.  She was depressed after recent deaths of 2 sisters.   Several recent admissions:  06/08/16 - 06/10/16 with acute on chronic abdominal pain, UTI, suspected resumption of ETOH abuse, hyponatremia, AKI, hypotension, dehydration, chronic anemia. LFTs minimally elevated.  Left AMA.  06/17/16 - 06/18/16 with weakness, admitted ETOH abuse and etoh hepatitis, thrombocytopenia, microcytic anemia.  06/21/16 - 06/22/16  with FTT, weakness, watery diarrhea, hypotension.  Pt denied ETOH but level of 77 at admission. C diff was negative. UTI treated. Family (primarily the Niece) felt pt needed long term placement. Pt refused PT eval and placement, chose to return to living alone.   9/8 - 07/04/16 with chronic diarrhea and hypotension, persistent mild LFT abnormalities.  Hyponatremia attributed to dehydration and discharged with orders for 1.5 liter fluid restrictions.   Readmitted 9/13 after fall, hypotensive,  HCAP, prolonged QTc, hypokalemia.  Worsening LFTs and coags:  T bili now to 10.6.  PT/INR 57/6.3 compared to 26/2.4 on 8/23 and 43/3.3 on 9/8). Ongoing chronic abd pain.  Family reported pt still drinking etoh. + AKI. Hgb down to 7.7 but up to 9.4 today without transfusion.  Worsening renal function.  Platelets to 60K.  Ammonia 88 at admission.  Has been getting Lactulose 30 gm TID since admission and having several BMs, not bloody.  Has not required any benzos based on CIWA scores.  This morning she ate some of her breakfast, took her meds but later became somnolent and refused later po meds.  Body temp dropped from 98.4, 98.9 to 91.2, Bear Hugger placed.   Dr Allyson Sabal has ordered a paracentesis.   Latest ultrasound 9/13: Diffuse echogenicity of hepatic parenchyma consistent with fatty infiltration or other hepatic cellular disease. No bile duct dilatation. Small amount of perihepatic fluid. Latest CT 07/01/16:  1. Moderate volume of free fluid in the abdomen pelvis suggest ascites. Severe hepatic steatosis. Query hepatic dysfunction. 2. Small bowel RIGHT colon submucosal edema. Edema favored secondary to passive transfer from ascites. No clear evidence of ischemia. Of note, there is NO atherosclerotic disease of the aorta or branch vessels. 3. Bilateral pleural effusions and basilar atelectasis increased from prior. 4. Anasarca of the soft tissues. Findings consistent with fluid overload.  PREVIOUS ENDOSCOPIC/GI STUDIES: 08/2013 Hemorrhoidal banding. Dr Johney Maine [phone] EGD and removal of biliary stent.  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  Dr Carlean Purl 1.  A mild stricture was noted in the distal common bile duct. Subtle finding. difficult visualization. Smooth. Cytology brushing obtained. 2.  There was dilation of the common bile duct and intrahepatic ducts. Max 13 mm CBD, s/p chole 3.  Under endoscopic and  fluoroscopic guidance a 10 Fr x 5 cm double lap stent was placed in the bile duct. 4.  Generalized dilation was found in the entire pancreatic duct with irregularity 12/2011 EUS.  Dr Ardis Hughs  INDICATIONS:  known etoh related chronic pancreatitis, last drink several years ago; CBD increased dilation and liver test elevation over past several months Findings:  1. CBD was dilated up to 1.1cm without clear filling defects, tapered smoothly into major papilla. 2. Pancreatic parenchyma was nearly completely replated with shadowing calcifications which signficantly decreased the sensitivity to find neoplastic lesions.  No clear solid masses. 3. Main pancreatic duct was ectatic, dilated,probably contains calcifications within the duct.  Pancreatic duct measured 23mm maximally. 4. No peripancreatic adenopathy. 5. Limited views of liver, spleen, portal and splenic vessels were all normal.        Past Medical History:  Diagnosis Date  . Alcohol abuse    Abstinent since 2009  . CHF (congestive heart failure) ([place])   . Chronic bronchitis ([place])   . Chronic pancreatitis ([place])   . Colon adenoma 03/2012   Diminutive cecal  . Common bile duct stricture   . Coronary artery disease    DES x 2 to LAD 2007, DES to LAD 07/2012  . Depression   . Essential hypertension   . Family history of adverse reaction to anesthesia   . Fatty liver   . GERD (gastroesophageal reflux disease)   . GERD (gastroesophageal reflux disease)   . Hemorrhoids   . Hiatal hernia   . History of blood transfusion    related to "fibroids"  . Hyperlipidemia   . PAD (peripheral artery disease) ([facility])    Occluded right external iliac artery   . Pneumonia 11/2011  . Psoriasis   . ST elevation myocardial infarction (STEMI) of anterior wall ([place]) 07/2012  . Type 2 diabetes mellitus ([place])    pt denies this history on 07/06/2016    Past Surgical History:  Procedure Laterality Date  . ABDOMINAL HYSTERECTOMY  1994  . BACK SURGERY      "had a knot in my back"  . CARDIAC CATHETERIZATION    . CHOLECYSTECTOMY OPEN  1976  . COLONOSCOPY     multiple  . ERCP  07/25/2012   Procedure: ENDOSCOPIC RETROGRADE CHOLANGIOPANCREATOGRAPHY (ERCP);  Surgeon: Gatha Mayer, MD;  Location: [facility];  Service: Endoscopy;  Laterality: N/A;  with stent  . ESOPHAGOGASTRODUODENOSCOPY  10/25/2012   Procedure: ESOPHAGOGASTRODUODENOSCOPY (EGD);  Surgeon: Gatha Mayer, MD;  Location: [facility];  Service: Endoscopy;  Laterality: N/A;  EGD with stent removal using ERCP scope  . EUS  01/05/2012   Procedure: UPPER ENDOSCOPIC ULTRASOUND (EUS) LINEAR;  Surgeon: Milus Banister, MD;  Location: [facility];  Service: Endoscopy;  Laterality: N/A;  radial linear  . HEMORRHOID SURGERY  09/17/2013   THD hem ligation/pexy  . LEFT HEART CATH N/A 08/10/2012   Procedure: LEFT HEART CATH;  Surgeon: Peter M Martinique, MD;  Location: [facility] CATH LAB;  Service: Cardiovascular;  Laterality: N/A;  . PERCUTANEOUS CORONARY STENT INTERVENTION (PCI-S)  08/10/2012   Procedure: PERCUTANEOUS CORONARY STENT INTERVENTION (PCI-S);  Surgeon: Peter M Martinique, MD;  Location: [facility] CATH LAB;  Service: Cardiovascular;;  . STRESS MYOCARDIAL PERFUSION  04/20/10   NORMAL PATTERN OF PERFUSION IN ALL REGIONS. LV SIZE IS NORMAL. EF= 61%. NO SIGN ISCHEMIA DEMONSTRATED.  Marland Kitchen TRANSTHORACIC ECHOCARDIOGRAM  04/20/10   PROXIMAL SEPTAL THICKENING IS NOTED. LV SYSTOLIC FUNCTION IS NORMAL. EF= >55%. LEFT ATRIAL SIZE IS NORMAL. RIGHT VENTRICULAR SYSTOLIC PRESSURE IS NORMAL. AV APPEARS TO BE MILDLY SCLEROTIC. NO AS.  Marland Kitchen UTERINE FIBROID SURGERY      Prior to Admission medications   Medication Sig Start Date End Date Taking? Authorizing Provider  acetaminophen (TYLENOL 8 HOUR) 650 MG CR tablet Take 1 tablet (650 mg total) by mouth every 8 (eight) hours as needed for pain. 02/05/16  Yes Varney Biles, MD  aspirin EC 81 MG EC tablet Take 1 tablet (81 mg total) by mouth daily. 08/13/12  Yes Isaiah Serge, NP    atorvastatin (LIPITOR) 40 MG tablet TAKE ONE (1) TABLET EACH DAY AT 6PM Patient taking differently: Take 40 mg by mouth each day 09/16/13  Yes Lorretta Harp, MD  clonazePAM (KLONOPIN) 1 MG tablet Take 1 tablet (1 mg total) by mouth at bedtime. 09/02/15  Yes Gatha Mayer, MD  clopidogrel (PLAVIX) 75 MG tablet Take 75 mg by mouth every evening.    Yes Historical Provider, MD  diclofenac sodium (VOLTAREN) 1 % GEL Apply 4 g topically 4 (four) times daily. 09/02/15  Yes Gatha Mayer, MD  feeding supplement, ENSURE ENLIVE, (ENSURE ENLIVE) LIQD Take 237 mLs by mouth 3 (three) times daily between meals. 06/17/16  Yes Costin Karlyne Greenspan, MD  ferrous gluconate (FERGON) 324 MG tablet Take 1 tablet (324 mg total) by mouth daily with breakfast. 06/17/16  Yes Costin Karlyne Greenspan, MD  folic acid (FOLVITE) 1 MG tablet Take 1 tablet (1 mg total) by mouth daily. 07/04/16  Yes Thurnell Lose, MD  loperamide (IMODIUM) 2 MG capsule Take 1 capsule (2 mg total) by mouth every 3 (three) hours as needed for diarrhea or loose stools. 07/04/16  Yes Thurnell Lose, MD  magnesium gluconate (MAGONATE) 30 MG tablet Take 1 tablet (30 mg total) by mouth 2 (two) times daily. Patient taking differently: Take 30 mg by mouth every morning.  06/17/16  Yes Costin Karlyne Greenspan, MD  mirtazapine (REMERON) 15 MG tablet Take 1 tablet (15 mg total) by mouth at bedtime. 07/13/12  Yes Gatha Mayer, MD  Multiple Vitamins-Minerals (MULTIVITAMIN) tablet Take 1 tablet by mouth daily. 06/17/16  Yes Costin Karlyne Greenspan, MD  nicotine (NICODERM CQ) 14 mg/24hr patch Place 1 patch onto the skin daily. 04/03/13  Yes Lorretta Harp, MD  nitroGLYCERIN (NITROSTAT) 0.4 MG SL tablet Place 1 tablet (0.4 mg total) under the tongue every 5 (five) minutes x 3 doses as needed for chest pain. 09/02/15  Yes Lorretta Harp, MD  omeprazole (PRILOSEC) 40 MG capsule TAKE ONE CAPSULE BY MOUTH DAILY Patient taking differently: TAKE 40 MG BY MOUTH DAILY 01/21/16  Yes Gatha Mayer, MD  Pancrelipase, Lip-Prot-Amyl, (CREON) 24000 UNITS CPEP TAKE 3 CAPSULES 3 TIMES A DAY WITH MEALS AND TAKE ONE CAPSULE WITH SNACKS Patient taking differently: Take 24,000-72,000 Units by mouth See admin instructions. Take 72000 units by mouth 3 times daily with meals and take 24000 units by mouth with snack. 09/02/15  Yes Ofilia Neas  Carlean Purl, MD  QUEtiapine (SEROQUEL) 50 MG tablet Take 50 mg by mouth at bedtime.   Yes Historical Provider, MD  thiamine 100 MG tablet Take 1 tablet (100 mg total) by mouth daily. 07/04/16  Yes Thurnell Lose, MD  traMADol (ULTRAM) 50 MG tablet Take 0.5 tablets (25 mg total) by mouth every 6 (six) hours as needed for moderate pain or severe pain. 06/17/16  Yes Costin Karlyne Greenspan, MD  losartan (COZAAR) 25 MG tablet Take 25 mg by mouth daily.    Historical Provider, MD  metFORMIN (GLUCOPHAGE) 500 MG tablet Take 500 mg by mouth daily.    Historical Provider, MD    Scheduled Meds: . ceFEPime (MAXIPIME) IV  1 g Intravenous Q24H  . feeding supplement (ENSURE ENLIVE)  237 mL Oral TID BM  . ferrous gluconate  324 mg Oral Q breakfast  . folic acid  1 mg Oral Daily  . Influenza vac split quadrivalent PF  0.5 mL Intramuscular Tomorrow-1000  . lactulose  30 g Oral TID  . lipase/protease/amylase  36,000 Units Oral TID WC  . magnesium oxide  400 mg Oral Daily  . mirtazapine  15 mg Oral QHS  . multivitamin with minerals  1 tablet Oral Daily  . nicotine  14 mg Transdermal Q24H  . pantoprazole  40 mg Oral Daily  . thiamine  100 mg Oral Daily  . vancomycin  500 mg Intravenous Q24H   Infusions:   PRN Meds: labetalol, LORazepam, ondansetron **OR** ondansetron (ZOFRAN) IV, traMADol   Allergies as of 07/06/2016 - Review Complete 07/06/2016  Allergen Reaction Noted  . Diclofenac sodium Other (See Comments) 11/07/2006  . Other Other (See Comments) 06/27/2012    Family History  Problem Relation Age of Onset  . Colon cancer Mother     Colon resection  . Diabetes Mother    . Cirrhosis Father   . Coronary artery disease Brother   . Renal Disease Brother   . Malignant hyperthermia Neg Hx     Social History   Social History  . Marital status: Single    Spouse name: N/A  . Number of children: 0  . Years of education: N/A   Occupational History  . RETIRED    Social History Main Topics  . Smoking status: Current Every Day Smoker    Packs/day: 0.50    Years: 48.00    Types: Cigarettes  . Smokeless tobacco: Never Used     Comment: 2 cigarettes a day  . Alcohol use 12.6 oz/week    21 Cans of beer per week     Comment: 07/06/2016 Past h/o alcohol abuse; "drink 3 beers/day"  . Drug use: No  . Sexual activity: No   Other Topics Concern  . Not on file   Social History Narrative  . No narrative on file    REVIEW OF SYSTEMS: Unable to obtain as pt is somnolent, obtunded    PHYSICAL EXAM: Vital signs in last 24 hours: Vitals:   07/11/16 0710 07/11/16 1130  BP: 114/84 (!) 159/143  Pulse: 78 77  Resp: (!) 30 (!) 39  Temp: 98.9 F (37.2 C) (!) 91.2 F (32.9 C)   Wt Readings from Last 3 Encounters:  07/06/16 68.3 kg (150 lb 9.2 oz)  07/04/16 66 kg (145 lb 6.4 oz)  06/22/16 61.6 kg (135 lb 12.9 oz)    General: looks ill.  Comfortable.  Warming blanket in place Head:  No asymmetry or signs of trauma  Eyes:  + icterus  Ears:  Unable to assess for hearing loss but does open eyes to name  Nose:  No discharge Mouth:  Clear, moist.  No fetor.   Neck:  No mass or JVD.   Lungs:  bil coarse BS.  Breathing not labored Heart: RRR.  No mrg.  S2, s2 present Abdomen:  Soft, NT, ND.  No mass or HSM.  + incisional hernia at medial end of RUQ incisional scar.   Rectal: deferred   Musc/Skeltl: no joint redness, swelling,  Extremities:  No CCE. Limb muscles atrophic.   Neurologic:  Opens eyes to her name, not following commands.  Decorticate posturing.  + asterixis (though difficult to ellicit)   Skin:  No telangectasia or open sores Psych:   Non-agitated.    Intake/Output from previous day: 09/17 0701 - 09/18 0700 In: 630 [P.O.:360; I.V.:220; IV Piggyback:50] Out: -  Intake/Output this shift: Total I/O In: 120 [P.O.:120] Out: -   LAB RESULTS:  Recent Labs  07/10/16 0549  WBC 13.4*  HGB 9.4*  HCT 27.7*  PLT 104*   BMET Lab Results  Component Value Date   NA 148 (H) 07/10/2016   NA 143 07/08/2016   NA 144 07/07/2016   K 4.4 07/10/2016   K 3.2 (L) 07/08/2016   K 3.5 07/07/2016   CL 117 (H) 07/10/2016   CL 117 (H) 07/08/2016   CL 119 (H) 07/07/2016   CO2 21 (L) 07/10/2016   CO2 19 (L) 07/08/2016   CO2 19 (L) 07/07/2016   GLUCOSE 130 (H) 07/10/2016   GLUCOSE 141 (H) 07/08/2016   GLUCOSE 185 (H) 07/07/2016   BUN 8 07/10/2016   BUN <5 (L) 07/08/2016   BUN <5 (L) 07/07/2016   CREATININE 1.87 (H) 07/10/2016   CREATININE 1.11 (H) 07/08/2016   CREATININE 0.98 07/07/2016   CALCIUM 8.8 (L) 07/10/2016   CALCIUM 7.9 (L) 07/08/2016   CALCIUM 7.7 (L) 07/07/2016   LFT  Recent Labs  07/10/16 0549  PROT 6.7  ALBUMIN 2.2*  AST 126*  ALT 54  ALKPHOS 111  BILITOT 10.6*   PT/INR Lab Results  Component Value Date   INR 6.00 (HH) 07/08/2016   INR 6.32 (HH) 07/06/2016   INR 3.31 07/01/2016   Hepatitis Panel No results for input(s): HEPBSAG, HCVAB, HEPAIGM, HEPBIGM in the last 72 hours. C-Diff No components found for: CDIFF Lipase     Component Value Date/Time   LIPASE 12 07/06/2016 1245    Drugs of Abuse     Component Value Date/Time   LABOPIA NONE DETECTED 06/21/2016 1631   COCAINSCRNUR NONE DETECTED 06/21/2016 1631   COCAINSCRNUR NEG 09/10/2014 1053   LABBENZ POSITIVE (A) 06/21/2016 1631   LABBENZ NEG 09/10/2014 1053   AMPHETMU NONE DETECTED 06/21/2016 1631   THCU NONE DETECTED 06/21/2016 1631   LABBARB NONE DETECTED 06/21/2016 1631     RADIOLOGY STUDIES: No results found.    IMPRESSION:   *  Alcoholic hepatitis with discriminant fx score of 204.  Acutely worsening clinical status  and lab parameters.  Fatty liver but not cirrhosis per recent CT and ultrasound imaging.   *  Coagulopathy.  Acutely worse x factor of 2 within 1 week.   *  AMS.  Hepatic encephalopathy on high dose po Laculose, 5 and 6 BMs per day for last 2 days.   *  AKI.   ? Hepatorenal syndrome.  Oliguric, last recorded urine output of 200 cc on 9/13.    *  Thrombocytopenia.   *  Chronic pancreatitis with chronic abdominal pain.  On Creon.  Appears comfortable in current obtunded state.  Previous biliary stent in 2013 for ? SOD was ineffective and removed after 7 months.     *  Cerebral atrophy per head CT 9/13.  Pt previously had capacity to make her own medical decisions, at present she does not.   *  Microcytic anemia.  FOBT + 9/13.  On daily PPI.    *  Prot cal malnutrition.  Multifactorial.     PLAN:     *  Cancelled the paracentesis.  With INR this high, too risky in terns of bleeding complications.  Besides she is already on Cefepime so if she does have SBP, this is adequate coverage. .   Additionally placing NGT fraught with bleeding risk so caution if this is attempted.    Discussed case with Dr Loletha Carrow, will see pt shortly, but he feels liver failure too advanced to benefit from Prednisolone. Her prognosis is dismal.     *  Palliative care consult has been ordered but not yet completed: "Due to high referral volume, there may be a delay seeing this patient,"  She is full code.  Guardianship hearing set for 10/5.  Does not look like pt has a legally designated HCPOA.(see CSW note of 9/14).  Her niece has been advocating for SNF placement due to obvious safety at home issues.  Pt is currently FULL CODE.    Azucena Freed  07/11/2016, 1:47 PM Pager: [phone]

## 2016-07-11 NOTE — Progress Notes (Signed)
Physical Therapy Treatment Patient Details Name: [name] Foley MRN: [id number] DOB: November 20, 1945 Today's Date: 07/11/2016    History of Present Illness [name] Leavy is a 70 y.o. female with a history of end-stage liver disease, alcohol abuse, chronic pancreatitis. She was recently admitted and discharged abdominal pain, diarrhea, hypertension, UTI. She presented this admission with hypotension and a fall.    PT Comments    Pt admitted with above diagnosis. Pt currently with functional limitations due to balance and endurance deficits. Pt was unable to even sit EOB today. Much weaker and loose stool leaked on floor.  Had to lie pt back down and change entire bed.  Not making progress but will continue PT as able.  Pt will benefit from skilled PT to increase their independence and safety with mobility to allow discharge to the venue listed below.    Follow Up Recommendations  SNF;Supervision/Assistance - 24 hour     Equipment Recommendations  Other (comment) (TBA)    Recommendations for Other Services       Precautions / Restrictions Precautions Precautions: Fall Precaution Comments: monitor HR Restrictions Weight Bearing Restrictions: No    Mobility  Bed Mobility Overal bed mobility: Needs Assistance Bed Mobility: Supine to Sit;Rolling Rolling: Total assist;+2 for physical assistance   Supine to sit: Max assist;HOB elevated     General bed mobility comments: Needed max assist for LEs and elevation of trunk with use of pad with pt unable to sit without mod assist due to heavy posterior lean.  Smelled an odor and noted that BM was dripping on floor.  Assisted pt back to bed and called nursing who came in and assisted PT with cleaning pt.  Total assit to clean pt with changing of bed linens and gown.    Transfers                    Ambulation/Gait                 Stairs            Wheelchair Mobility    Modified Rankin (Stroke Patients Only)        Balance Overall balance assessment: Needs assistance Sitting-balance support: Bilateral upper extremity supported;Feet supported Sitting balance-Leahy Scale: Fair Sitting balance - Comments: needed mod to max assist to sit EOB and noted BM on floor and had to lie pt back down. .     Standing balance-Leahy Scale: Poor Standing balance comment: unable                    Cognition Arousal/Alertness: Awake/alert Behavior During Therapy: WFL for tasks assessed/performed;Flat affect Overall Cognitive Status: No family/caregiver present to determine baseline cognitive functioning                      Exercises      General Comments        Pertinent Vitals/Pain Pain Assessment: Faces Faces Pain Scale: Hurts even more Pain Location: generalized Pain Descriptors / Indicators: Grimacing;Guarding;Sore Pain Intervention(s): Limited activity within patient's tolerance;Monitored during session;Repositioned  77 bpm, 93% on 2LO2    Home Living                      Prior Function            PT Goals (current goals can now be found in the care plan section) Progress towards PT goals: Not progressing toward goals - comment (loose  stools and pt much weaker, lack of effort)    Frequency    Min 3X/week      PT Plan      Co-evaluation             End of Session   Activity Tolerance: Patient limited by fatigue;Patient limited by pain Patient left: in bed;with call bell/phone within reach;with bed alarm set     Time: [place]:[id number] PT Time Calculation (min) (ACUTE ONLY): 14 min  Charges:  $Therapeutic Activity: 8-22 mins                    G CodesIrwin Brakeman F 2016-08-08, 2:52 PM  [facility] [phone] (pager)

## 2016-07-11 NOTE — Consult Note (Addendum)
   [facility] Inpatient Consult   07/11/2016  [name] Foley January 09, 1946 [id number]   Patient screened for potential [facility] services patient's 5th admission in 6 months. . Chart review reveals the patient  [name] Foley is a 70 y.o. female with a history of end-stage liver disease, alcohol abuse, chronic pancreatitis. She was recently admitted and discharged abdominal pain, diarrhea, hypertension, UTI. She presented this admission with hypotension and a fall.  Physical Therapy notes are recommending SNF.  Patient evaluated for [facility] services.  Patient is not eligible for [facility] Care Management services because her Primary Care Provider is no longer [facility] primary care provider. Dr. Nolene Ebbs noted as primary care provider.    Reason:  For questions contact:   Natividad Brood, RN BSN [facility] Liaison  [phone] business mobile phone Toll free office [phone]

## 2016-07-12 DIAGNOSIS — A419 Sepsis, unspecified organism: Principal | ICD-10-CM

## 2016-07-12 DIAGNOSIS — R401 Stupor: Secondary | ICD-10-CM

## 2016-07-12 DIAGNOSIS — Z7189 Other specified counseling: Secondary | ICD-10-CM

## 2016-07-12 DIAGNOSIS — R791 Abnormal coagulation profile: Secondary | ICD-10-CM

## 2016-07-12 DIAGNOSIS — N179 Acute kidney failure, unspecified: Secondary | ICD-10-CM

## 2016-07-12 DIAGNOSIS — Z515 Encounter for palliative care: Secondary | ICD-10-CM

## 2016-07-12 DIAGNOSIS — K7290 Hepatic failure, unspecified without coma: Secondary | ICD-10-CM

## 2016-07-12 DIAGNOSIS — R1084 Generalized abdominal pain: Secondary | ICD-10-CM

## 2016-07-12 DIAGNOSIS — F1010 Alcohol abuse, uncomplicated: Secondary | ICD-10-CM

## 2016-07-12 DIAGNOSIS — K7682 Hepatic encephalopathy: Secondary | ICD-10-CM

## 2016-07-12 LAB — COMPREHENSIVE METABOLIC PANEL
ALK PHOS: 110 U/L (ref 38–126)
ALT: 54 U/L (ref 14–54)
AST: 145 U/L — AB (ref 15–41)
Albumin: 1.7 g/dL — ABNORMAL LOW (ref 3.5–5.0)
Anion gap: 6 (ref 5–15)
BILIRUBIN TOTAL: 8.7 mg/dL — AB (ref 0.3–1.2)
BUN: 11 mg/dL (ref 6–20)
CALCIUM: 8.3 mg/dL — AB (ref 8.9–10.3)
CHLORIDE: 124 mmol/L — AB (ref 101–111)
CO2: 18 mmol/L — ABNORMAL LOW (ref 22–32)
CREATININE: 2.64 mg/dL — AB (ref 0.44–1.00)
GFR, EST AFRICAN AMERICAN: 20 mL/min — AB (ref >60)
GFR, EST NON AFRICAN AMERICAN: 17 mL/min — AB (ref >60)
Glucose, Bld: 72 mg/dL (ref 65–99)
Potassium: 5.8 mmol/L — ABNORMAL HIGH (ref 3.5–5.1)
Sodium: 148 mmol/L — ABNORMAL HIGH (ref 135–145)
Total Protein: 5.4 g/dL — ABNORMAL LOW (ref 6.5–8.1)

## 2016-07-12 LAB — CULTURE, BLOOD (ROUTINE X 2): Culture: NO GROWTH

## 2016-07-12 MED ORDER — SODIUM CHLORIDE 0.9 % IV BOLUS (SEPSIS)
500.0000 mL | Freq: Once | INTRAVENOUS | Status: AC
  Administered 2016-07-12: 500 mL via INTRAVENOUS

## 2016-07-12 MED ORDER — MORPHINE SULFATE (PF) 2 MG/ML IV SOLN: 1.0000 mg | INTRAVENOUS | Status: DC | PRN

## 2016-07-12 MED ORDER — MORPHINE SULFATE (CONCENTRATE) 10 MG/0.5ML PO SOLN: 5.0000 mg | ORAL | 0 refills | Status: DC | PRN

## 2016-07-12 MED ORDER — MORPHINE SULFATE (CONCENTRATE) 10 MG/0.5ML PO SOLN: 5.0000 mg | ORAL | 0 refills | Status: AC | PRN

## 2016-07-12 NOTE — Progress Notes (Addendum)
[facility] Progress Note  Chief Complaint: alcoholic cirrhosis  Subjective  History:  Patient continued to have poor mental status overnight.  BP was low requiring fluid bolus.  UO unknown, as not recorded. Patient awakens with tactile stimulation, but remains stuporous and will not follow commands.  She is uncooperative with exam.  Cannot perform ROS  ROS: UTO due to mental status  Objective:  Med list reviewed  Vital signs in last 24 hrs: Vitals:   07/12/16 0400 07/12/16 0730  BP: 94/79   Pulse: 81   Resp: (!) 22   Temp: 98.1 F (36.7 C) 98.2 F (36.8 C)    Physical Exam   HEENT: sclera deeply icteric, oral mucosa not visualized.    Neck: supple, no thyromegaly, JVD or lymphadenopathy  Cardiac: RRR without murmurs, S1S2 heard, + pedal edema R>L  Pulm: clear to auscultation bilaterally, normal RR (16) and poor effort noted  Abdomen: soft, + tenderness over lower abdominal ventral hernia - reducible, with active bowel sounds. Cannot assess HSM since patient keeps pushing my hands away.  Does not have large volume ascites by exam.  Skin; warm and dry, no rash.    Neuro - limited exam.  Right arm appears contracted.  MAE to painful stimuli, but seems to move right side less.  Recent Labs:   Recent Labs Lab 07/07/16 0703 07/08/16 0838 07/10/16 0549  WBC 5.2 8.2 13.4*  HGB 7.7* 9.0* 9.4*  HCT 22.7* 25.4* 27.7*  PLT 89* 60* 104*    Recent Labs Lab 07/10/16 0549  NA 148*  K 4.4  CL 117*  CO2 21*  BUN 8  ALBUMIN 2.2*  ALKPHOS 111  ALT 54  AST 126*  GLUCOSE 130*    Recent Labs Lab 07/08/16 0838  INR 6.00*   No LABS TODAY  Radiologic studies: Head CT 6 days ago neg for anything acute  @ASSESSMENTPLANBEGIN @ Assessment:  Alcoholic cirrhosis with severe coagulopathy, renal failure and sepsis picture (unclear cause).  Broad empiric Abx coverage on board.  Altered mental status - likely multifactorial, mostly hepatic encephalopathy.   Difficult to treat since mental status/behavior and high INR preclude NGT for lactulose.  Neuro exam worrisome for possible CVA.  Ongoing alcohol abuse recently  See consult last evening.  Prognosis grim.    Plan:  Will order STAT labs this AM to reassess renal function, bilirubin, INR.  If medicine team thinks this is a change from her (limited) neuro exam prior to her decompensation in last 48 hrs, then please strongly consider [place] head CT scan.  If ICH, would significantly affect prognosis and plan.  Family meeting apparently set for today.  Palliative care seems appropriate for this patient.  Total time 30 minutes  Nelida Meuse III Pager [phone] Mon-Fri 8a-5p [phone] after 5p, weekends, holidays

## 2016-07-12 NOTE — Progress Notes (Addendum)
12:45pm [facility] unable to accept patient today but is following and will check in tomorrow.  CSW spoke with pt niece, Romelle Starcher, who is agreeable to CSW looking into other hospice options- would like [facility] as second choice- referral made  11am CSW consulted for residential hospice placement at [facility]- referral made to [facility] will follow up and check on bed availability   CSW will continue to follow  Jorge Ny, [place] Social Worker [phone]
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
## 2016-07-12 NOTE — Plan of Care (Signed)
Problem: Physical Regulation: Goal: Quality of life will improve Outcome: Not Progressing Variance: Not applicable Comments: Patient is on comfort measures

## 2016-07-12 NOTE — Discharge Summary (Addendum)
Physician Discharge Summary  [name] Foley MRN: [id number] DOB/AGE: Nov 17, 1945 70 y.o.  PCP: Dorrene German, MD   Admit date: 07/06/2016 Discharge date: 07/12/2016  Discharge Diagnoses:    Active Problems:   PANCREATITIS, CHRONIC    Abdominal pain, chronic, epigastric - from chronic pancreatitis   Alcoholic liver failure (HCC)   Liver failure (HCC)   Pneumonia   Metabolic acidosis   Thrombocytopenia (HCC)   Sepsis (HCC)   Acute renal failure (ARF) (HCC)   Elevated INR   Hepatic encephalopathy (HCC)    Patient being discharged to [facility] when bed available         Current Discharge Medication List    START taking these medications   Details  Morphine Sulfate (MORPHINE CONCENTRATE) 10 MG/0.5ML SOLN concentrated solution Take 0.25 mLs (5 mg total) by mouth every 4 (four) hours as needed for severe pain. Qty: 30 mL, Refills: 0      STOP taking these medications     acetaminophen (TYLENOL 8 HOUR) 650 MG CR tablet      aspirin EC 81 MG EC tablet      atorvastatin (LIPITOR) 40 MG tablet      clonazePAM (KLONOPIN) 1 MG tablet      clopidogrel (PLAVIX) 75 MG tablet      diclofenac sodium (VOLTAREN) 1 % GEL      feeding supplement, ENSURE ENLIVE, (ENSURE ENLIVE) LIQD      ferrous gluconate (FERGON) 324 MG tablet      folic acid (FOLVITE) 1 MG tablet      loperamide (IMODIUM) 2 MG capsule      magnesium gluconate (MAGONATE) 30 MG tablet      mirtazapine (REMERON) 15 MG tablet      Multiple Vitamins-Minerals (MULTIVITAMIN) tablet      nicotine (NICODERM CQ) 14 mg/24hr patch      nitroGLYCERIN (NITROSTAT) 0.4 MG SL tablet      omeprazole (PRILOSEC) 40 MG capsule      Pancrelipase, Lip-Prot-Amyl, (CREON) 24000 UNITS CPEP      QUEtiapine (SEROQUEL) 50 MG tablet      thiamine 100 MG tablet      traMADol (ULTRAM) 50 MG tablet      losartan (COZAAR) 25 MG tablet      metFORMIN (GLUCOPHAGE) 500 MG tablet          Discharge Condition:     Discharge Instructions Get Medicines reviewed and adjusted: Please take all your medications with you for your next visit with your Primary MD  Please request your Primary MD to go over all hospital tests and procedure/radiological results at the follow up, please ask your Primary MD to get all Hospital records sent to his/her office.  If you experience worsening of your admission symptoms, develop shortness of breath, life threatening emergency, suicidal or homicidal thoughts you must seek medical attention immediately by calling 911 or calling your MD immediately if symptoms less severe.  You must read complete instructions/literature along with all the possible adverse reactions/side effects for all the Medicines you take and that have been prescribed to you. Take any new Medicines after you have completely understood and accpet all the possible adverse reactions/side effects.   Do not drive when taking Pain medications.   Do not take more than prescribed Pain, Sleep and Anxiety Medications  Special Instructions: If you have smoked or chewed Tobacco in the last 2 yrs please stop smoking, stop any regular Alcohol and or any Recreational drug use.  Wear Seat belts while driving.  Please note  You were cared for by a hospitalist during your hospital stay. Once you are discharged, your primary care physician will handle any further medical issues. Please note that NO REFILLS for any discharge medications will be authorized once you are discharged, as it is imperative that you return to your primary care physician (or establish a relationship with a primary care physician if you do not have one) for your aftercare needs so that they can reassess your need for medications and monitor your lab values.     Allergies  Allergen Reactions  . Diclofenac Sodium Other (See Comments)    REACTION: Chronic renal insufficiency. Patient can use the gel   . Other Other (See Comments)     Blackeyed peas and Pinto Beans...break out in hives      Disposition: 01-Home or Self Care   Consults:  Palliative care GI    Significant Diagnostic Studies:  Dg Chest 2 View  Result Date: 06/14/2016 CLINICAL DATA:  Bilateral ankle edema, cough EXAM: CHEST  2 VIEW COMPARISON:  05/16/2016 FINDINGS: There is no focal parenchymal opacity. There is no pleural effusion or pneumothorax. The heart and mediastinal contours are unremarkable. The osseous structures are unremarkable. IMPRESSION: No active cardiopulmonary disease. Electronically Signed   By: Elige Ko   On: 06/14/2016 17:03   Ct Head Wo Contrast  Result Date: 07/06/2016 CLINICAL DATA:  Unwitnessed fall.  Elevated INR level. EXAM: CT HEAD WITHOUT CONTRAST TECHNIQUE: Contiguous axial images were obtained from the base of the skull through the vertex without intravenous contrast. COMPARISON:  10/25/2006 FINDINGS: Brain: Stable mild cerebral atrophy. No evidence for acute hemorrhage, mass lesion, midline shift, hydrocephalus or large infarct. Vascular: No hyperdense vessel or unexpected calcification. Skull:  No fracture. Sinuses/Orbits: Changes compatible with bilateral maxillary antrostomy procedures. Mild mucosal disease in visualized left maxillary sinus. Mild mucosal disease in the sphenoid sinuses. Normal appearance of the orbits. Other: None. IMPRESSION: No acute intracranial abnormality. Electronically Signed   By: Richarda Overlie M.D.   On: 07/06/2016 15:30   US Abdomen Complete  Result Date: 07/01/2016 CLINICAL DATA:  Acute onset of upper abdominal pain and abdominal distention. Initial encounter. EXAM: ABDOMEN ULTRASOUND COMPLETE COMPARISON:  CT of the abdomen and pelvis, and right upper quadrant ultrasound, performed 06/08/2016 FINDINGS: Gallbladder: Status post cholecystectomy.  No retained stones seen. Common bile duct: Diameter: 0.8 cm, within normal limits in caliber status post cholecystectomy. Liver: No focal lesion  identified. There is diffuse heterogeneity within the liver, with increased parenchymal echogenicity, likely reflecting mild hepatic cirrhosis. The liver is mildly enlarged. IVC: No abnormality visualized. Pancreas: Visualized portion unremarkable. Spleen: Size and appearance within normal limits. Right Kidney: Length: 9.7 cm. Echogenicity within normal limits. No mass or hydronephrosis visualized. Left Kidney: Length: 10.2 cm. Echogenicity within normal limits. No mass or hydronephrosis visualized. Abdominal aorta: No aneurysm visualized. Not fully characterized due to overlying bowel gas. Other findings: Small to moderate volume ascites is noted within the abdomen. IMPRESSION: 1. Small to moderate volume ascites within the abdomen. 2. Diffuse heterogeneity within the liver, with increased parenchymal echogenicity, likely reflecting mild hepatic cirrhosis. Mild hepatomegaly. 3. Status post cholecystectomy. Electronically Signed   By: Roanna Raider M.D.   On: 07/01/2016 05:22   Ct Abdomen Pelvis W Contrast  Result Date: 07/01/2016 CLINICAL DATA:  Abdominal pain for several days. EXAM: CT ABDOMEN AND PELVIS WITH CONTRAST TECHNIQUE: Multidetector CT imaging of the abdomen and pelvis was performed using  the standard protocol following bolus administration of intravenous contrast. CONTRAST:  1 ISOVUE-300 IOPAMIDOL (ISOVUE-300) INJECTION 61% COMPARISON:  CT 06/08/2016 FINDINGS: Lower chest: Bilateral pleural effusions and basilar atelectasis Hepatobiliary: Marked hepatic steatosis. Postcholecystectomy. No biliary duct dilatation. Pancreas: Extensive pancreatic calcifications. No pancreatic pseudocyst formation. No clear evidence acute pancreatitis. Spleen: Normal Adrenals/Urinary Tract: Adrenal glands and kidneys are normal. Kidneys enhance symmetrically. No obstructive uropathy. Bladder normal Stomach/Bowel: The stomach, duodenum, and small bowel are not obstructed. There is mild small bowel wall edema likely  related to ascites. There is mild edema of the ascending colon. There is submucosal edema within the ascending and transverse colon. Descending colon is normal. Sigmoid colon relatively normal. Rectum normal. Vascular/Lymphatic: Abdominal aorta is normal caliber. There is no retroperitoneal or periportal lymphadenopathy. No pelvic lymphadenopathy. No atherosclerotic calcification. The SMA and celiac trunk are patent. Reproductive: Post hysterectomy. Other: Moderate volume of free fluid within the abdomen pelvis. This is increased from 06/08/2016. There multiple small ventral hernias containing peritoneal fat and peritoneal fluid. Anasarca of the extraperitoneal soft tissues. Musculoskeletal: No aggressive osseous lesion. IMPRESSION: 1. Moderate volume of free fluid in the abdomen pelvis suggest ascites. Severe hepatic steatosis. Query hepatic dysfunction. 2. Small bowel RIGHT colon submucosal edema. Edema favored secondary to passive transfer from ascites. No clear evidence of ischemia. Of note, there is NO atherosclerotic disease of the aorta or branch vessels. 3. Bilateral pleural effusions and basilar atelectasis increased from prior. 4. Anasarca of the soft tissues. Findings consistent with fluid overload. Electronically Signed   By: Genevive Bi M.D.   On: 07/01/2016 14:48   US Renal  Result Date: 07/11/2016 CLINICAL DATA:  Acute renal failure EXAM: LIMITED ABDOMINAL ULTRASOUND COMPARISON:  None. FINDINGS: There is moderate ascites throughout the abdomen and pelvis. There are multiple loops of peristalsing bowel. These findings in concert preclude sonographic assessment of the kidneys or urinary bladder. Neither kidney in particular could be visualized on this study. IMPRESSION: Kidneys and urinary bladder cannot be visualized due to overlying gas and ascites. There is moderate generalized ascites. Given the clinical history, noncontrast enhanced CT of the abdomen and pelvis may be advisable to assess  kidneys and urinary bladder. Electronically Signed   By: Bretta Bang III M.D.   On: 07/11/2016 21:38   US Abdomen Limited  Result Date: 07/06/2016 CLINICAL DATA:  Abdominal distention. EXAM: US ABDOMEN LIMITED - RIGHT UPPER QUADRANT COMPARISON:  CT 07/01/2016 FINDINGS: Gallbladder: Cholecystectomy. Common bile duct: Diameter: 4.9 mm Liver: Diffusely echogenic hepatic parenchyma consistent with fatty infiltration or other hepatic cellular disease. No focal liver lesion is evident.Small amount of perihepatic fluid. IMPRESSION: Diffuse echogenicity of hepatic parenchyma consistent with fatty infiltration or other hepatic cellular disease. No bile duct dilatation. Small amount of perihepatic fluid. Electronically Signed   By: Ellery Plunk M.D.   On: 07/06/2016 22:35   US Paracentesis  Result Date: 07/01/2016 INDICATION: Ascites EXAM: ULTRASOUND-GUIDED PARACENTESIS COMPARISON:  None. MEDICATIONS: 10 cc 1% lidocaine COMPLICATIONS: None immediate. TECHNIQUE: Informed written consent was obtained from the patient after a discussion of the risks, benefits and alternatives to treatment. A timeout was performed prior to the initiation of the procedure. Initial ultrasound scanning demonstrates a small amount of ascites within the right lower abdominal quadrant. The right lower abdomen was prepped and draped in the usual sterile fashion. 1% lidocaine was used for local anesthesia. Under direct ultrasound guidance, a 19 gauge, 7-cm, Yueh catheter was introduced. An ultrasound image was saved for documentation purposed. The paracentesis was  performed. The catheter was removed and a dressing was applied. The patient tolerated the procedure well without immediate post procedural complication. FINDINGS: A total of approximately 180 cc of yellow fluid was removed. Samples were sent to the laboratory as requested by the clinical team. IMPRESSION: Successful ultrasound-guided paracentesis yielding 180 cc of peritoneal  fluid. Read by Robet Leu [facility] Electronically Signed   By: Michele Mor D.O.   On: 07/01/2016 14:44   Dg Chest Portable 1 View  Result Date: 07/06/2016 CLINICAL DATA:  Cough, hypotension, recent fall EXAM: PORTABLE CHEST 1 VIEW COMPARISON:  Chest thickening 2017 FINDINGS: There is minimal opacity in the right mid lung and patchy pneumonia cannot be excluded. The lungs are not well aerated. No pleural effusion is seen. Mediastinal and hilar contours are unremarkable. The heart is within normal limits in size. No bony abnormality is seen. IMPRESSION: Minimal opacity in the right mid lung may represent a patchy area of pneumonia. Consider followup Electronically Signed   By: Dwyane Dee M.D.   On: 07/06/2016 13:02   Dg Abd Acute W/chest  Result Date: 06/21/2016 CLINICAL DATA:  Lower extremity edema, hypotensive. History of diabetes and hypertension. EXAM: DG ABDOMEN ACUTE W/ 1V CHEST COMPARISON:  Chest radiograph June 14, 2016 and CT abdomen and pelvis June 08, 2016 FINDINGS: Cardiomediastinal silhouette is normal. Coronary artery stent. LEFT lung base atelectasis/scarring. Lungs are otherwise clear, no pleural effusions. No pneumothorax. Soft tissue planes and included osseous structures are unremarkable. Bowel gas pattern is nondilated. Scattered nondistended small bowel air-fluid level is predominately in the pelvis. Paucity of large bowel gas. No intra-abdominal mass effect, pathologic calcifications or free air. Soft tissue planes and included osseous structures are non-suspicious. Coarse pancreatic calcifications are seen on prior CT. Surgical clips in the included right abdomen compatible with cholecystectomy. Surgical clips in RIGHT inguinal soft tissues compatible with prior vascular access. IMPRESSION: No acute cardiopulmonary process. Nondistended small bowel with air-fluid levels suggesting enteritis though, partial or early small bowel obstruction may have this appearance. Electronically  Signed   By: Awilda Metro M.D.   On: 06/21/2016 16:43   Dg Abd Portable 1v  Result Date: 07/01/2016 CLINICAL DATA:  Abdominal pain with distention EXAM: PORTABLE ABDOMEN - 1 VIEW COMPARISON:  06/08/2016 FINDINGS: Scattered large and small bowel gas is noted. No abnormal mass is noted. Multiple calcifications are noted in the midline consistent with chronic pancreatitis. These are stable from the prior exam. No acute bony abnormality is noted. IMPRESSION: No acute abnormality seen. Electronically Signed   By: Alcide Clever M.D.   On: 07/01/2016 10:24           Filed Weights   07/06/16 1129 07/06/16 1900 07/11/16 2241  Weight: 65.8 kg (145 lb) 68.3 kg (150 lb 9.2 oz) 72.1 kg (158 lb 15.2 oz)     Microbiology: Recent Results (from the past 240 hour(s))  Urine culture     Status: None   Collection Time: 07/06/16  2:52 PM  Result Value Ref Range Status   Specimen Description URINE, RANDOM  Final   Special Requests NONE  Final   Culture NO GROWTH  Final   Report Status 07/07/2016 FINAL  Final  MRSA PCR Screening     Status: None   Collection Time: 07/06/16  7:57 PM  Result Value Ref Range Status   MRSA by PCR NEGATIVE NEGATIVE Final    Comment:        The GeneXpert MRSA Assay (FDA approved for NASAL  specimens only), is one component of a comprehensive MRSA colonization surveillance program. It is not intended to diagnose MRSA infection nor to guide or monitor treatment for MRSA infections.   Culture, blood (routine x 2)     Status: None   Collection Time: 07/07/16  6:35 PM  Result Value Ref Range Status   Specimen Description BLOOD LEFT ARM  Final   Special Requests IN PEDIATRIC BOTTLE 3CC  Final   Culture NO GROWTH 5 DAYS  Final   Report Status 07/12/2016 FINAL  Final       Blood Culture    Component Value Date/Time   SDES BLOOD LEFT ARM 07/07/2016 1835   SPECREQUEST IN PEDIATRIC BOTTLE 3CC 07/07/2016 1835   CULT NO GROWTH 5 DAYS 07/07/2016 1835   REPTSTATUS  07/12/2016 FINAL 07/07/2016 1835      Labs: No results found for this or any previous visit (from the past 48 hour(s)).   Lipid Panel     Component Value Date/Time   CHOL 89 08/11/2012 0520   TRIG 48 08/11/2012 0520   HDL 29 (L) 08/11/2012 0520   CHOLHDL 3.1 08/11/2012 0520   VLDL 10 08/11/2012 0520   LDLCALC 50 08/11/2012 0520     Lab Results  Component Value Date   HGBA1C 6.0 (H) 08/11/2012   HGBA1C 6.2 (H) 12/02/2011   HGBA1C 5.7 02/03/2010     Lab Results  Component Value Date   LDLCALC 50 08/11/2012   CREATININE 1.87 (H) 07/10/2016     HPI   [name] Foley is a 70 y.o. female.  Hx CAD, Non-STEMI and cardiac stents in 2007 and 2013.  PAD, occluded right ext iliac.  Chronic Plavix.  Chronic pancreatitis with associated chronic abdominal pain and ongoing etoh abuse, on Pancreatic enzyme supplements.  ETOH hepatitis, fatty liver but no clear diagnosis of cirrhosis.  Coagulopathy.  Protein malnutrition, ~ 08/2015 PMD added 5 mg Prednisone daily to boost her appetite.  Several recent admissions:  06/08/16 - 06/10/16 with acute on chronic abdominal pain, UTI, suspected resumption of ETOH abuse, hyponatremia, AKI, hypotension, dehydration, chronic anemia. LFTs minimally elevated.  Left AMA.  06/17/16 - 06/18/16 with weakness, admitted ETOH abuse and etoh hepatitis, thrombocytopenia, microcytic anemia.  06/21/16 - 06/22/16  with FTT, weakness, watery diarrhea, hypotension.  Pt denied ETOH but level of 77 at admission. C diff was negative. UTI treated. Family (primarily the Niece) felt pt needed long term placement. Pt refused PT eval and placement, chose to return to living alone.   9/8 - 07/04/16 with chronic diarrhea and hypotension, persistent mild LFT abnormalities.  Hyponatremia attributed to dehydration and discharged with orders for 1.5 liter fluid restrictions.   Readmitted 9/13 after fall, hypotensive, HCAP, prolonged QTc, hypokalemia.  Worsening LFTs and coags:  T bili  now to 10.6.  PT/INR 57/6.3 compared to 26/2.4 on 8/23 and 43/3.3 on 9/8). Ongoing chronic abd pain.  Family reported pt still drinking etoh. + AKI. Hgb down to 7.7 but up to 9.4 today without transfusion.  Worsening renal function.  Platelets to 60K.  Ammonia 88 at admission.  Has been getting Lactulose 30 gm TID since admission and having several BMs, not bloody.  Has not required any benzos based on CIWA scores.  This morning she ate some of her breakfast, took her meds but later became somnolent and refused later po meds.  Body temp dropped from 98.4, 98.9 to 91.2, Bear Hugger placed.  HOSPITAL COURSE:    Alcoholic hepatitis with discriminant  fx score of 204.  Acutely worsening clinical status and lab parameters.  Fatty liver but not cirrhosis per recent CT and ultrasound imaging.   *  Coagulopathy.  Acutely worse x factor of 2 within 1 week.   *  AMS.  Hepatic encephalopathy on high dose po Laculose, 5 and 6 BMs per day for last 2 days.   *  AKI.   ? Hepatorenal syndrome.  Oliguric, last recorded urine output of 200 cc on 9/13.    *  Thrombocytopenia.   *  Chronic pancreatitis with chronic abdominal pain.  On Creon.  Appears comfortable in current obtunded state.  Previous biliary stent in 2013 for ? SOD was ineffective and removed after 7 months.     *  Cerebral atrophy per head CT 9/13.  Pt previously had capacity to make her own medical decisions, at present she does not.   *  Microcytic anemia.  FOBT + 9/13.  On daily PPI.    *  Prot cal malnutrition.  Multifactorial.    Disposition rapid clinical deterioration of decompensated alcoholic liver disease. Her worsening coagulopathy, mental status and renal function paint the picture of a grim prognosis.  She is not a liver transplant candidate due to recent alcohol use.  Therefore, DNR status is appropriate.  She appears at high risk of respiratory failure. -patient's nieces had a palliative care meeting today and have  decided to transition to full comfort care. Outpatient medications stopped. Patient started on when necessary Roxanol Consultation placed for [facility] place Anticipate discharge when bed available  Discharge Exam:  Blood pressure (!) 84/67, pulse 88, temperature 98.2 F (36.8 C), temperature source Oral, resp. rate (!) 30, height 5\' 2"  (1.575 m), weight 72.1 kg (158 lb 15.2 oz), SpO2 94 %. Patient awakens with tactile stimulation, but remains stuporous and will not follow commands       Signed: Pasty Manninen 07/12/2016, 12:22 PM        Time spent >45 mins

## 2016-07-12 NOTE — Progress Notes (Addendum)
Patient asleep and not arousable .Marland Kitchen Unable to assess patient for CIWA scale.Marland Kitchen Resting quietly.  Milford Cage, RN

## 2016-07-12 NOTE — Plan of Care (Signed)
Problem: Education: Goal: Knowledge of [facility] Education information/materials will improve Outcome: Progressing Family made aware by day shift that patient has poor prognosis. Plans for Palliative family meeting 9/19.   Problem: Health Behavior/Discharge Planning: Goal: Ability to manage health-related needs will improve Outcome: Progressing Family meeting due to take place, the plan of care will be discussed with patient's nieces at that time.   Problem: Physical Regulation: Goal: Ability to maintain clinical measurements within normal limits will improve Outcome: Progressing Patient declining from day to night shift. Patient does not interact with staff. When trying to move or assess her she moans and pulls away but does not speak to staff. Patient unable to take medications by mouth due to declining mental status. BP continued to drop , going as low as 73/51. Patient received bolus of 500 ml, with some improvement. Continuing to monitor patient for changes and awaiting palliative meeting.

## 2016-07-12 NOTE — Progress Notes (Signed)
[facility] Liaison Note:  Received request from [place], [place] for POA interest in patient placement at [facility].  Met with Thomia to confirm interest and answer questions.  Unfortunately, there is no bed availability today.  Will follow up with CSW and Thomia in AM re: bed availability.  Thank you, Freddi Starr RN, [facility] Liaison [phone]

## 2016-07-13 NOTE — Progress Notes (Signed)
Nutrition Brief Note  Chart reviewed. Pt now transitioning to comfort care.  No further nutrition interventions warranted at this time.  Please re-consult as needed.   Kambryn Dapolito A. Josephine Wooldridge, RD, LDN, CDE Pager: [phone] After hours Pager: [phone]  

## 2016-07-13 NOTE — Progress Notes (Signed)
Discharge Note:  Patient transported to [facility] via [facility].  Report called to Arbie Cookey, RN at the facility.

## 2016-07-13 NOTE — Progress Notes (Addendum)
  Progress note  Chart briefly reviewed. Patient unable to provide any history secondary to ongoing mental status changes from encephalopathy. She mumbles incomprehensively. Discussed with patient's RN, who did not report any acute issues apart from mental status changes.  Middle-aged female, chronically ill-looking, lying comfortably propped up in bed. Scleral icterus appreciated.  Vitals:   07/13/16 0000 07/13/16 0028 07/13/16 0359 07/13/16 0811  BP: 102/60 102/60 111/64 124/62  Pulse: 92 90 93 95  Resp: (!) 29 (!) 27 (!) 26 (!) 26  Temp:  99.1 F (37.3 C) 98.5 F (36.9 C) 97.9 F (36.6 C)  TempSrc:  Oral Oral Axillary  SpO2:  95% 92% 94%  Weight:      Height:       RS: Poor inspiratory effort. Seems clear to auscultation. CVS: S1 and S2 heard, RRR. No JVD or murmurs. 2+ pitting leg edema. Telemetry: Sinus rhythm. GI/abdomen: Distended, soft and nontender. Ascites + +. Normal bowel sounds heard. CNS: Alert but confused. Not oriented. Does not follow instructions. Mumbles incomprehensively. Musculoskeletal system: Moves all extremities spontaneously.  Assessment and plan:  Acute hepatic failure from possible fulminant alcoholic hepatitis,? Underlying cirrhosis complicated by coagulopathy, ascites, hepatic encephalopathy, acute kidney injury concerning for hepatorenal syndrome, thrombocytopenia and malnutrition. Palliative care team input appreciated. Given overall very poor prognosis, after discussing with family, patient has been transitioned to full comfort care and was awaiting a residential hospice bed which is available today. Discussed with Dr. Rowe Pavy and Dr. Allyson Sabal and deemed appropriate for residential hospice for [facility] care. Discussed with clinical Education officer, museum.  DC to [employer] today. No changes to DC summary done by Dr. Allyson Sabal on 07/13/2016.  Vernell Leep, MD, FACP, FHM. [facility] Pager [phone]  If 7PM-7AM, please contact  night-coverage [email or web address] Password [email or web address] 07/13/2016, 12:13 PM

## 2016-07-13 NOTE — Care Management Important Message (Signed)
Important Message  Patient Details  Name: [name] Foley MRN: [id number] Date of Birth: 07-08-1946   Medicare Important Message Given:  Yes    Nathen May 07/13/2016, 9:20 AM

## 2016-07-13 NOTE — Progress Notes (Signed)
PT Cancellation Note D/C Note Patient Details Name: [place] F Pollack MRN: [id number] DOB: 1946/06/17   Cancelled Treatment:    Reason Eval/Treat Not Completed: Medical issues which prohibited therapy (comfort care. Sign off.)Thanks.    Irwin Brakeman F 07/13/2016, 12:26 PM  Brittanyann Wittner,PT Acute Rehabilitation [phone] (pager)

## 2016-07-13 NOTE — Progress Notes (Signed)
Patient will discharge to [place] Anticipated discharge date: 9/20 Family notified: pt niece Psychologist, forensic by [employer]- called at 12:50pm  [place] signing off.  Jorge Ny, [place] Social Worker [phone]

## 2016-07-31 ENCOUNTER — Other Ambulatory Visit: Payer: Self-pay | Admitting: Internal Medicine

## 2016-09-07 ENCOUNTER — Ambulatory Visit: Payer: Medicare Other | Admitting: Internal Medicine

## 2018-01-20 IMAGING — CT CT ABD-PELV W/ CM
2 of 6 series · 16 of 46 positions shown, 18 images · IV contrast (Omni 300)
Comparison: 07/18/2012

CLINICAL DATA: Upper abdominal pain, 3 days duration. Nausea and
vomiting today. Constipation.

EXAM:
CT ABDOMEN AND PELVIS WITH CONTRAST
TECHNIQUE: Multidetector CT imaging of the abdomen and pelvis was performed
using the standard protocol following bolus administration of
intravenous contrast.
CONTRAST:  80mL 32FHKV-6SS IOPAMIDOL (32FHKV-6SS) INJECTION 61%

[Series 2: a/p w/ 5mm · axial · 0.76mm/px · z∈[-722,-306]mm · 13 of 95 slices shown, 15 images]
[im 6/95  soft-tissue]
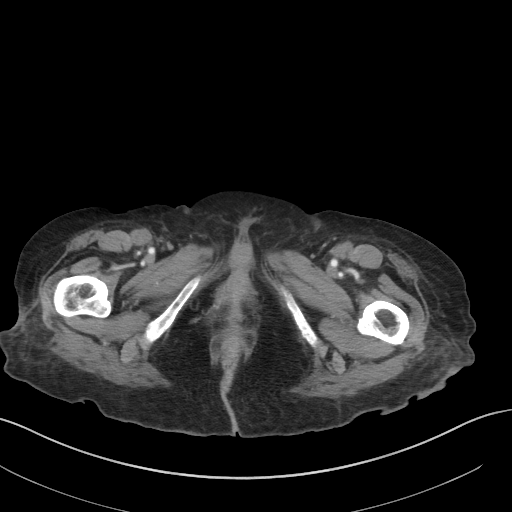
[im 6/95  bone]
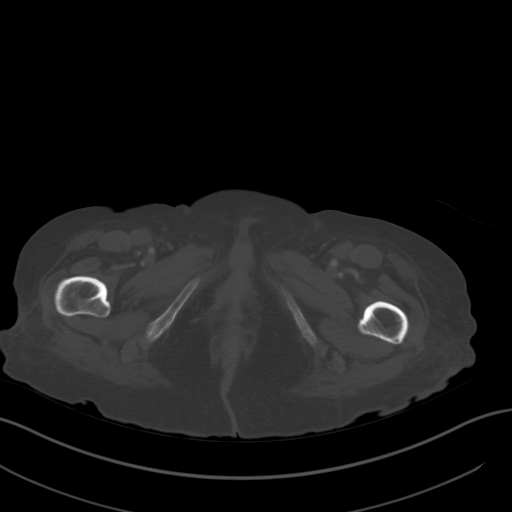
[im 12/95  soft-tissue]
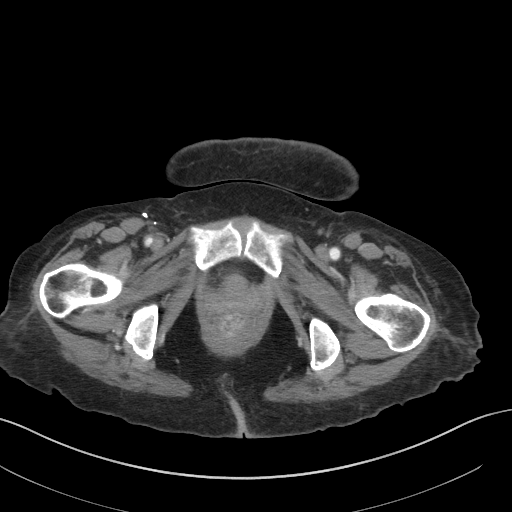
[im 18/95  soft-tissue]
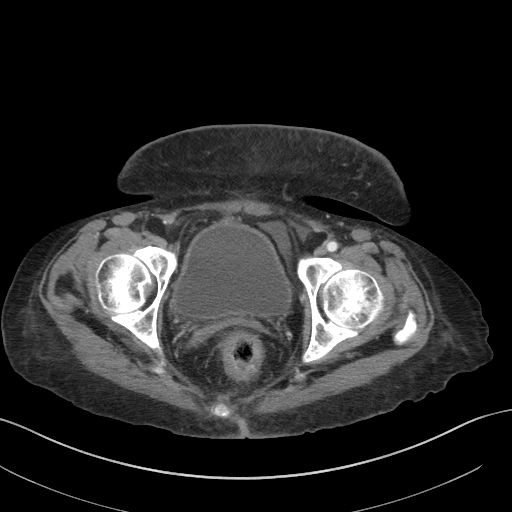
[im 30/95  soft-tissue]
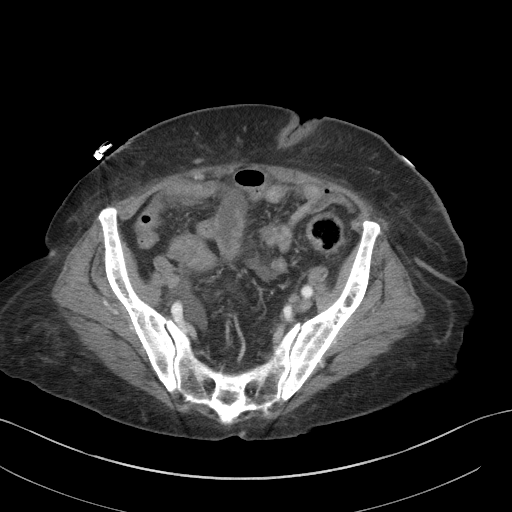
[im 36/95  soft-tissue]
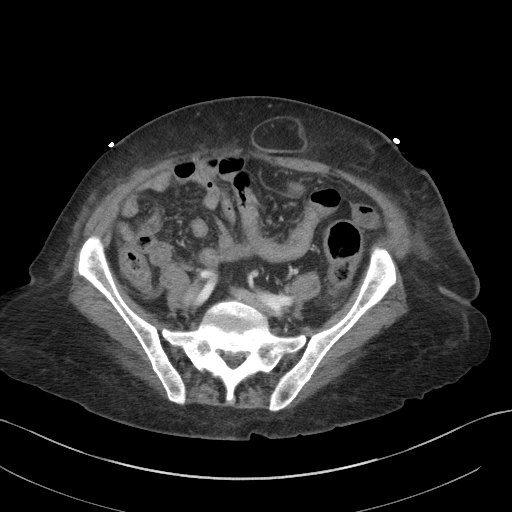
[im 42/95  soft-tissue]
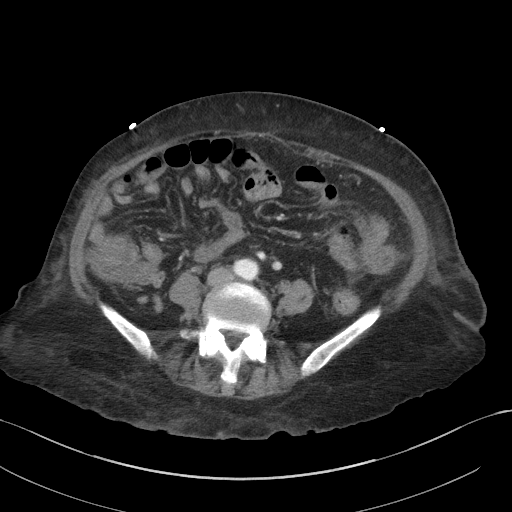
[im 48/95  soft-tissue]
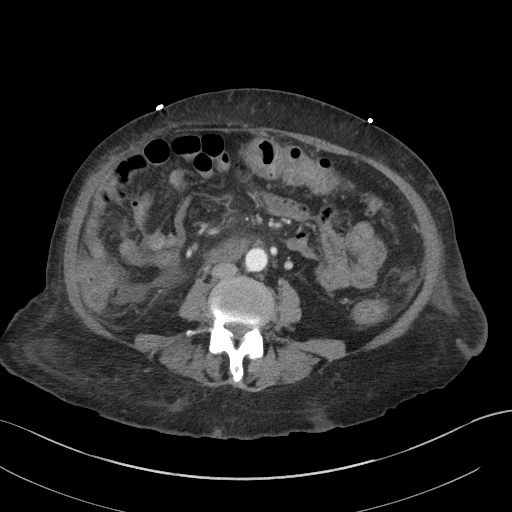
[im 53/95  soft-tissue]
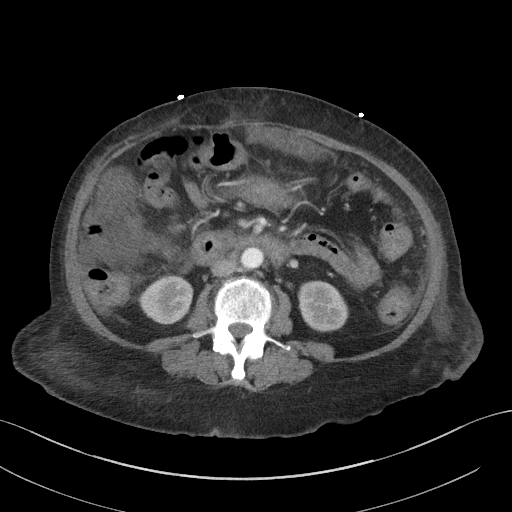
[im 59/95  soft-tissue]
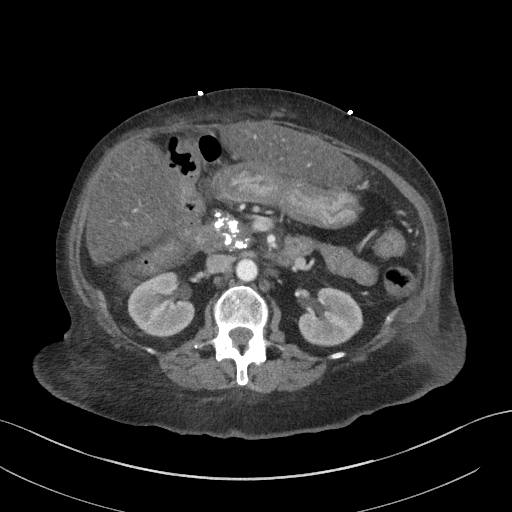
[im 59/95  bone]
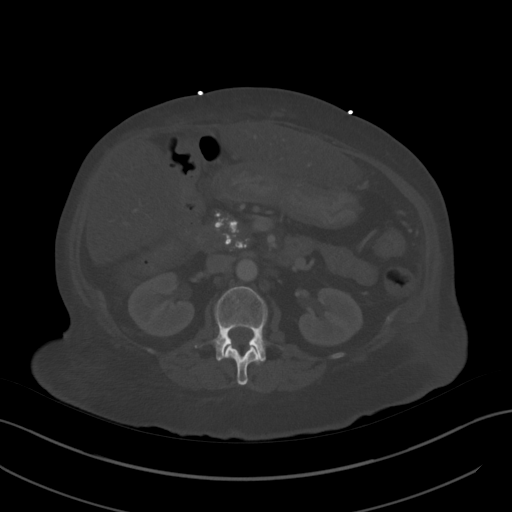
[im 65/95  soft-tissue]
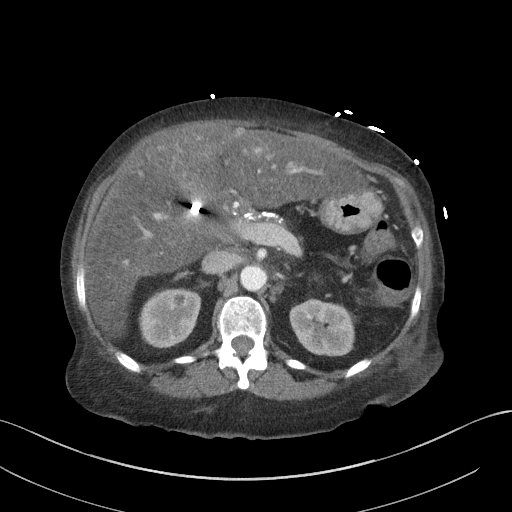
[im 77/95  soft-tissue]
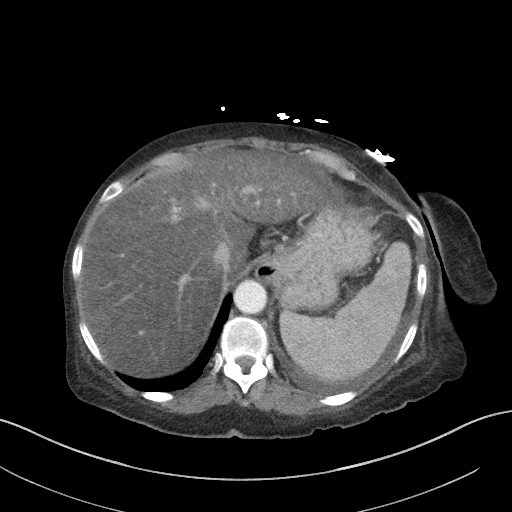
[im 83/95  soft-tissue]
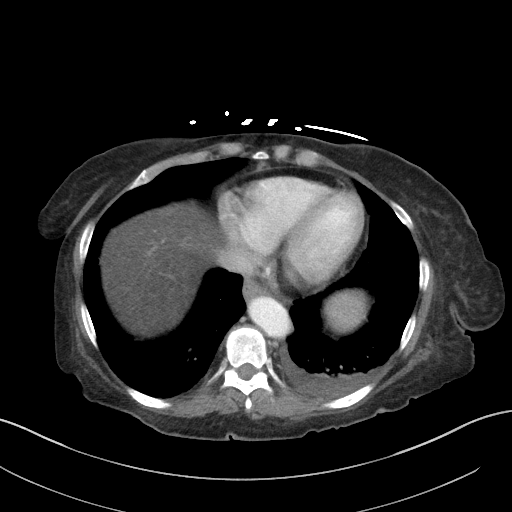
[im 89/95  soft-tissue]
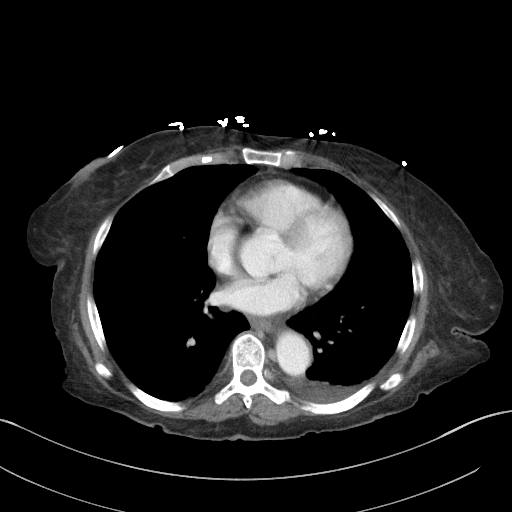

[Series 5: a/p w/ cor · coronal · 0.79mm/px · 3 of 126 slices shown]
[im 42/126  soft-tissue]
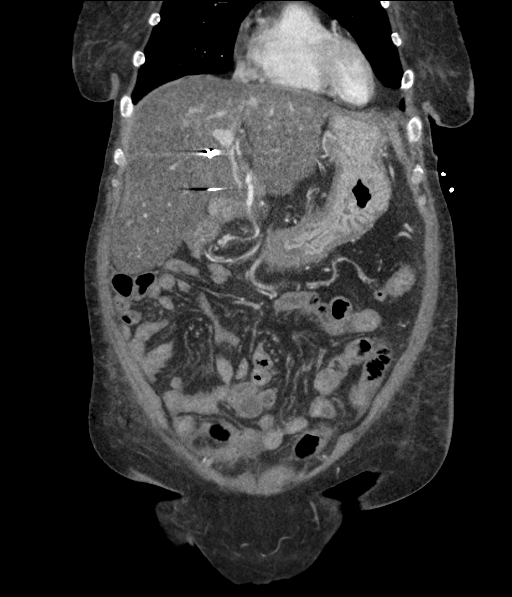
[im 56/126  soft-tissue]
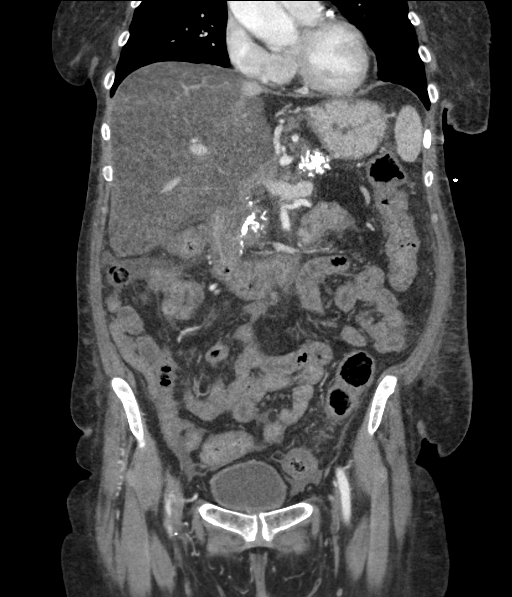
[im 70/126  soft-tissue]
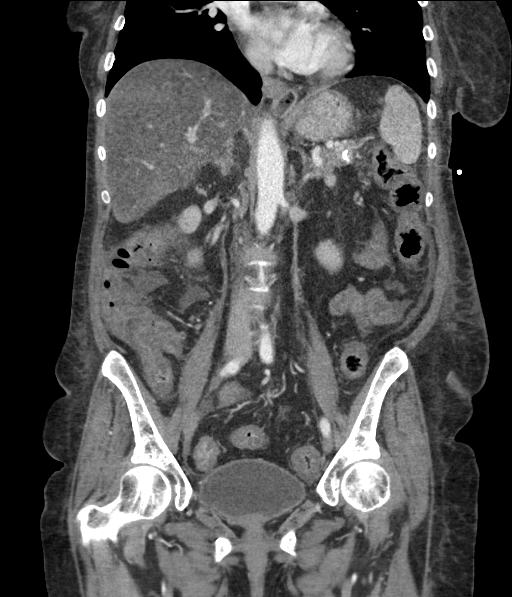

[16 of 46 positions shown; findings below may reference images not displayed]

FINDINGS: Lower chest: Left effusion layering dependently. Atelectasis of the
dependent left lung. Coronary artery stent.

Hepatobiliary: Profound fatty change of the liver. Previous
cholecystectomy. Portal and hepatic veins are patent.

Pancreas: Chronic pancreatic calcifications secondary to old
pancreatitis. No evidence of active pancreatic process.

Spleen: Upper limits of normal in size without focal lesion.

Adrenals/Urinary Tract: Adrenal glands are normal. Left kidney is
normal. Small cysts at the upper pole of the right kidney.

Stomach/Bowel: Question edema of the colon wall that could go along
with colitis. No evidence of bowel obstruction.

Vascular/Lymphatic: Normal

Reproductive: Previous hysterectomy.  No pelvic mass.

Other: Small amount of ascites. Periumbilical ventral hernia
containing only fat. Right upper quadrant anterior abdominal wall
hernia containing only fat.

Musculoskeletal:  Normal
IMPRESSION: Profound fatty change of the liver.

Chronic pancreatic calcifications consistent with old pancreatitis.

Probable edema of the wall of the colon consistent with colitis.

Small amount of ascites.

Small left pleural effusion layering dependently.

Periumbilical hernia containing fat. Right upper quadrant anterior
abdominal wall hernia containing only fat.
